# Patient Record
Sex: Female | Born: 1975 | Race: Black or African American | Hispanic: No | Marital: Married | Smoking: Never smoker
Health system: Southern US, Community
[De-identification: ages and names within clinical notes are randomized; demographics above are authoritative.]

## PROBLEM LIST (undated history)

## (undated) DIAGNOSIS — E8881 Metabolic syndrome: Secondary | ICD-10-CM

## (undated) DIAGNOSIS — E88819 Insulin resistance, unspecified: Secondary | ICD-10-CM

## (undated) DIAGNOSIS — F988 Other specified behavioral and emotional disorders with onset usually occurring in childhood and adolescence: Secondary | ICD-10-CM

## (undated) DIAGNOSIS — R928 Other abnormal and inconclusive findings on diagnostic imaging of breast: Secondary | ICD-10-CM

## (undated) DIAGNOSIS — F429 Obsessive-compulsive disorder, unspecified: Secondary | ICD-10-CM

## (undated) DIAGNOSIS — F329 Major depressive disorder, single episode, unspecified: Secondary | ICD-10-CM

## (undated) DIAGNOSIS — F32A Depression, unspecified: Secondary | ICD-10-CM

## (undated) DIAGNOSIS — Z87442 Personal history of urinary calculi: Secondary | ICD-10-CM

## (undated) DIAGNOSIS — E559 Vitamin D deficiency, unspecified: Secondary | ICD-10-CM

## (undated) DIAGNOSIS — K5900 Constipation, unspecified: Secondary | ICD-10-CM

## (undated) DIAGNOSIS — E669 Obesity, unspecified: Secondary | ICD-10-CM

## (undated) DIAGNOSIS — Z8632 Personal history of gestational diabetes: Secondary | ICD-10-CM

## (undated) DIAGNOSIS — R7301 Impaired fasting glucose: Secondary | ICD-10-CM

## (undated) DIAGNOSIS — L309 Dermatitis, unspecified: Secondary | ICD-10-CM

## (undated) DIAGNOSIS — T7840XA Allergy, unspecified, initial encounter: Secondary | ICD-10-CM

## (undated) DIAGNOSIS — F419 Anxiety disorder, unspecified: Secondary | ICD-10-CM

## (undated) DIAGNOSIS — F3181 Bipolar II disorder: Secondary | ICD-10-CM

## (undated) DIAGNOSIS — G629 Polyneuropathy, unspecified: Secondary | ICD-10-CM

## (undated) DIAGNOSIS — N926 Irregular menstruation, unspecified: Secondary | ICD-10-CM

## (undated) DIAGNOSIS — N920 Excessive and frequent menstruation with regular cycle: Secondary | ICD-10-CM

## (undated) DIAGNOSIS — R7303 Prediabetes: Secondary | ICD-10-CM

## (undated) DIAGNOSIS — E039 Hypothyroidism, unspecified: Secondary | ICD-10-CM

## (undated) DIAGNOSIS — Z91018 Allergy to other foods: Secondary | ICD-10-CM

## (undated) DIAGNOSIS — K219 Gastro-esophageal reflux disease without esophagitis: Secondary | ICD-10-CM

## (undated) DIAGNOSIS — M2550 Pain in unspecified joint: Secondary | ICD-10-CM

## (undated) HISTORY — DX: Hypothyroidism, unspecified: E03.9

## (undated) HISTORY — DX: Irregular menstruation, unspecified: N92.6

## (undated) HISTORY — DX: Dermatitis, unspecified: L30.9

## (undated) HISTORY — DX: Allergy, unspecified, initial encounter: T78.40XA

## (undated) HISTORY — DX: Bipolar II disorder: F31.81

## (undated) HISTORY — DX: Other specified behavioral and emotional disorders with onset usually occurring in childhood and adolescence: F98.8

## (undated) HISTORY — DX: Excessive and frequent menstruation with regular cycle: N92.0

## (undated) HISTORY — DX: Polyneuropathy, unspecified: G62.9

## (undated) HISTORY — DX: Obsessive-compulsive disorder, unspecified: F42.9

## (undated) HISTORY — DX: Allergy to other foods: Z91.018

## (undated) HISTORY — DX: Constipation, unspecified: K59.00

## (undated) HISTORY — DX: Personal history of urinary calculi: Z87.442

## (undated) HISTORY — DX: Gastro-esophageal reflux disease without esophagitis: K21.9

## (undated) HISTORY — DX: Pain in unspecified joint: M25.50

## (undated) HISTORY — DX: Vitamin D deficiency, unspecified: E55.9

## (undated) HISTORY — DX: Depression, unspecified: F32.A

## (undated) HISTORY — DX: Anxiety disorder, unspecified: F41.9

## (undated) HISTORY — PX: TRIGGER FINGER RELEASE: SHX641

## (undated) HISTORY — DX: Metabolic syndrome: E88.81

## (undated) HISTORY — DX: Obesity, unspecified: E66.9

## (undated) HISTORY — DX: Other abnormal and inconclusive findings on diagnostic imaging of breast: R92.8

## (undated) HISTORY — DX: Personal history of gestational diabetes: Z86.32

## (undated) HISTORY — DX: Impaired fasting glucose: R73.01

## (undated) HISTORY — DX: Major depressive disorder, single episode, unspecified: F32.9

## (undated) HISTORY — DX: Prediabetes: R73.03

## (undated) HISTORY — DX: Insulin resistance, unspecified: E88.819

---

## 1999-03-21 DIAGNOSIS — O24419 Gestational diabetes mellitus in pregnancy, unspecified control: Secondary | ICD-10-CM

## 2012-03-11 HISTORY — PX: INTRAUTERINE DEVICE INSERTION: SHX323

## 2012-03-20 DIAGNOSIS — Z87442 Personal history of urinary calculi: Secondary | ICD-10-CM

## 2012-03-20 HISTORY — DX: Personal history of urinary calculi: Z87.442

## 2012-03-20 HISTORY — PX: BREAST BIOPSY: SHX20

## 2012-09-11 ENCOUNTER — Ambulatory Visit: Payer: Self-pay | Admitting: Family Medicine

## 2013-01-08 ENCOUNTER — Ambulatory Visit: Payer: Self-pay | Admitting: Family Medicine

## 2013-02-07 ENCOUNTER — Ambulatory Visit: Payer: Self-pay | Admitting: Family Medicine

## 2013-02-12 ENCOUNTER — Ambulatory Visit: Payer: Self-pay | Admitting: Family Medicine

## 2013-02-14 LAB — PATHOLOGY REPORT

## 2014-02-16 ENCOUNTER — Ambulatory Visit: Payer: Self-pay | Admitting: Family Medicine

## 2014-09-10 ENCOUNTER — Other Ambulatory Visit: Payer: Self-pay | Admitting: Unknown Physician Specialty

## 2014-09-10 NOTE — Telephone Encounter (Signed)
Your patient.  Thanks 

## 2014-10-02 ENCOUNTER — Encounter: Payer: Self-pay | Admitting: Family Medicine

## 2014-10-08 DIAGNOSIS — E559 Vitamin D deficiency, unspecified: Secondary | ICD-10-CM | POA: Insufficient documentation

## 2014-10-08 DIAGNOSIS — G629 Polyneuropathy, unspecified: Secondary | ICD-10-CM | POA: Insufficient documentation

## 2014-10-08 DIAGNOSIS — L309 Dermatitis, unspecified: Secondary | ICD-10-CM | POA: Insufficient documentation

## 2014-10-08 DIAGNOSIS — E039 Hypothyroidism, unspecified: Secondary | ICD-10-CM | POA: Insufficient documentation

## 2014-10-08 DIAGNOSIS — R7301 Impaired fasting glucose: Secondary | ICD-10-CM | POA: Insufficient documentation

## 2014-10-08 DIAGNOSIS — E669 Obesity, unspecified: Secondary | ICD-10-CM | POA: Insufficient documentation

## 2014-10-08 DIAGNOSIS — N926 Irregular menstruation, unspecified: Secondary | ICD-10-CM | POA: Insufficient documentation

## 2014-10-09 ENCOUNTER — Encounter: Payer: Self-pay | Admitting: Family Medicine

## 2014-10-09 ENCOUNTER — Ambulatory Visit (INDEPENDENT_AMBULATORY_CARE_PROVIDER_SITE_OTHER): Payer: BC Managed Care – PPO | Admitting: Family Medicine

## 2014-10-09 VITALS — BP 115/73 | HR 65 | Temp 98.5°F | Ht 65.0 in | Wt 234.0 lb

## 2014-10-09 DIAGNOSIS — N200 Calculus of kidney: Secondary | ICD-10-CM

## 2014-10-09 DIAGNOSIS — L298 Other pruritus: Secondary | ICD-10-CM

## 2014-10-09 DIAGNOSIS — A499 Bacterial infection, unspecified: Secondary | ICD-10-CM | POA: Diagnosis not present

## 2014-10-09 DIAGNOSIS — G629 Polyneuropathy, unspecified: Secondary | ICD-10-CM | POA: Insufficient documentation

## 2014-10-09 DIAGNOSIS — E039 Hypothyroidism, unspecified: Secondary | ICD-10-CM | POA: Diagnosis not present

## 2014-10-09 DIAGNOSIS — M545 Low back pain, unspecified: Secondary | ICD-10-CM

## 2014-10-09 DIAGNOSIS — N760 Acute vaginitis: Secondary | ICD-10-CM | POA: Diagnosis not present

## 2014-10-09 DIAGNOSIS — R7301 Impaired fasting glucose: Secondary | ICD-10-CM

## 2014-10-09 DIAGNOSIS — Z124 Encounter for screening for malignant neoplasm of cervix: Secondary | ICD-10-CM | POA: Diagnosis not present

## 2014-10-09 DIAGNOSIS — E559 Vitamin D deficiency, unspecified: Secondary | ICD-10-CM

## 2014-10-09 DIAGNOSIS — Z0000 Encounter for general adult medical examination without abnormal findings: Secondary | ICD-10-CM | POA: Diagnosis not present

## 2014-10-09 DIAGNOSIS — B9689 Other specified bacterial agents as the cause of diseases classified elsewhere: Secondary | ICD-10-CM

## 2014-10-09 DIAGNOSIS — R768 Other specified abnormal immunological findings in serum: Secondary | ICD-10-CM | POA: Diagnosis not present

## 2014-10-09 DIAGNOSIS — N898 Other specified noninflammatory disorders of vagina: Secondary | ICD-10-CM

## 2014-10-09 MED ORDER — METRONIDAZOLE 500 MG PO TABS: 500.0000 mg | ORAL_TABLET | Freq: Two times a day (BID) | ORAL | Status: DC

## 2014-10-09 MED ORDER — CITALOPRAM HYDROBROMIDE 20 MG PO TABS: 20.0000 mg | ORAL_TABLET | Freq: Every day | ORAL | Status: DC

## 2014-10-09 NOTE — Assessment & Plan Note (Signed)
Check A1C today

## 2014-10-09 NOTE — Assessment & Plan Note (Signed)
Check TSH today

## 2014-10-09 NOTE — Progress Notes (Signed)
Patient ID: Kathryn Munoz, female   DOB: 05/19/1975, 39 y.o.   MRN: [id number]   Subjective:   Kathryn Munoz is a 39 y.o. female here for a complete physical exam  Interim issues since last visit: none  Past Medical History  Diagnosis Date  . Hypothyroidism   . IFG (impaired fasting glucose)   . History of gestational diabetes   . Vitamin D deficiency disease   . Obesity   . History of kidney stones 2014  . Irregular periods   . Dermatitis   . Abnormal mammogram     breast biopsy, [facility] 2014  . Neuropathy    Past Surgical History  Procedure Laterality Date  . Breast biopsy  2014    [facility]   Family History  Problem Relation Age of Onset  . Diabetes Mother   . Pancreatitis Mother   . Hypertension Mother   . Diabetes Sister     pre-diabetic  . Diabetes Brother   . ADD / ADHD Son   . Eczema Son    History  Substance Use Topics  . Smoking status: Never Smoker   . Smokeless tobacco: Never Used  . Alcohol Use: No   Older brother had diabetes, lost weight and has been taken off medicine  Review of Systems  Constitutional: Positive for fatigue.  HENT: Positive for hearing loss (has to have people repeat things).   Eyes: Negative for visual disturbance.  Respiratory: Negative for shortness of breath and wheezing.   Cardiovascular: Negative for palpitations and leg swelling.  Gastrointestinal: Positive for constipation (helped by miralax).  Genitourinary: Positive for vaginal discharge.  Musculoskeletal: Positive for back pain (right side, hx of kidney stones).  Skin: Positive for rash (neck, acanthosis nigricans).  Neurological: Positive for numbness (in the right arm for a while, now in all limbs and comes and goes). Negative for weakness.  Psychiatric/Behavioral: The patient is nervous/anxious (still has some anxiety).   thinks medicine is helping  Objective:   Filed Vitals:   10/09/14 1520  BP: 115/73  Pulse: 65  Temp: 98.5 F (36.9 C)  Height:   (1.651 m)  Weight: 234 lb (106.142 kg)  SpO2: 97%   Body mass index is 38.94 kg/(m^2). Wt Readings from Last 3 Encounters:  10/09/14 234 lb (106.142 kg)  05/13/14 240 lb (108.863 kg)   Physical Exam  Constitutional: She appears well-developed and well-nourished.  HENT:  Head: Normocephalic and atraumatic.  Eyes: Conjunctivae and EOM are normal. Right eye exhibits no hordeolum. Left eye exhibits no hordeolum. No scleral icterus.  Neck: Carotid bruit is not present. No thyromegaly present.  Cardiovascular: Normal rate, regular rhythm, S1 normal, S2 normal and normal heart sounds.   No extrasystoles are present.  Pulmonary/Chest: Effort normal and breath sounds normal. No respiratory distress. Right breast exhibits no inverted nipple, no mass, no nipple discharge, no skin change and no tenderness. Left breast exhibits no inverted nipple, no mass, no nipple discharge, no skin change and no tenderness. Breasts are symmetrical.  Abdominal: Soft. Normal appearance and bowel sounds are normal. She exhibits no distension, no abdominal bruit, no pulsatile midline mass and no mass. There is no hepatosplenomegaly. There is no tenderness. No hernia.  Genitourinary: Uterus normal. Pelvic exam was performed with patient prone. There is no rash or lesion on the right labia. There is no rash or lesion on the left labia. Cervix exhibits discharge. Cervix exhibits no motion tenderness. Right adnexum displays no mass, no tenderness and no  fullness. Left adnexum displays no mass, no tenderness and no fullness. Vaginal discharge found.  Musculoskeletal: Normal range of motion. She exhibits no edema.  Lymphadenopathy:       Head (right side): No submandibular adenopathy present.       Head (left side): No submandibular adenopathy present.    She has no cervical adenopathy.    She has no axillary adenopathy.  Neurological: She is alert. She displays no tremor. No cranial nerve deficit. She exhibits normal muscle  tone. Gait normal.  Skin: Skin is warm and dry. No bruising and no ecchymosis noted. No cyanosis. No pallor.  Psychiatric: Her speech is normal and behavior is normal. Thought content normal. Her mood appears not anxious. She does not exhibit a depressed mood.    Assessment/Plan:   Problem List Items Addressed This Visit      Endocrine   Hypothyroidism    Check TSH today      IFG (impaired fasting glucose)    Check A1C today      Relevant Orders   Hgb A1c w/o eAG (Completed)     Nervous and Auditory   Peripheral neuropathy    Referral put in again for neurologist; heavy metals and thyroid and glucose checked; will get TSH and glucose again today      Relevant Medications   citalopram (CELEXA) 20 MG tablet   Other Relevant Orders   Ambulatory referral to Neurology     Other   Vitamin D deficiency disease    Check level today      Relevant Orders   Vit D  25 hydroxy (rtn osteoporosis monitoring) (Completed)    Other Visit Diagnoses    Preventative health care    -  Primary    Relevant Orders    CBC with Differential/Platelet (Completed)    TSH (Completed)    Lipid Panel w/o Chol/HDL Ratio (Completed)    Comprehensive metabolic panel (Completed)    Vaginal itching        Relevant Orders    WET PREP FOR TRICH, YEAST, CLUE (Completed)    Right-sided low back pain without sciatica        Relevant Orders    UA/M w/rflx Culture, Routine (Completed)    Kidney stone        Pap smear for cervical cancer screening        Relevant Orders    Pap liquid-based and HPV (high risk)    Positive ANA (antinuclear antibody)        check again today    Relevant Orders    ANA w/Reflex if Positive (Completed)    Bacterial vaginosis        Relevant Medications    metroNIDAZOLE (FLAGYL) 500 MG tablet        Meds ordered this encounter  Medications  . VITAMIN D, ERGOCALCIFEROL, PO    Sig: Take 5,000 mg by mouth daily.  . citalopram (CELEXA) 20 MG tablet    Sig: Take 1  tablet (20 mg total) by mouth daily.    Dispense:  30 tablet    Refill:  3  . metroNIDAZOLE (FLAGYL) 500 MG tablet    Sig: Take 1 tablet (500 mg total) by mouth 2 (two) times daily.    Dispense:  14 tablet    Refill:  0   Orders Placed This Encounter  Procedures  . WET PREP FOR TRICH, YEAST, CLUE  . Microscopic Examination  . UA/M w/rflx Culture, Routine  . CBC with Differential/Platelet  .  Hgb A1c w/o eAG  . ANA w/Reflex if Positive  . TSH  . Lipid Panel w/o Chol/HDL Ratio    Order Specific Question:  Has the patient fasted?    Answer:  Yes  . Comprehensive metabolic panel    Order Specific Question:  Has the patient fasted?    Answer:  Yes  . Vit D  25 hydroxy (rtn osteoporosis monitoring)  . Ambulatory referral to Neurology    Referral Priority:  Routine    Referral Type:  Consultation    Referral Reason:  Specialty Services Required    Requested Specialty:  Neurology    Number of Visits Requested:  1    Follow up plan: Return in about 1 year (around 10/09/2015) for physical.  An After Visit Summary was printed and given to the patient.

## 2014-10-09 NOTE — Assessment & Plan Note (Signed)
Referral put in again for neurologist; heavy metals and thyroid and glucose checked; will get TSH and glucose again today

## 2014-10-09 NOTE — Assessment & Plan Note (Signed)
Check level today 

## 2014-10-09 NOTE — Patient Instructions (Addendum)
If you have not heard anything from my staff in a week about any orders/referrals/studies from today, please contact us here to follow-up [phone] Increase the citalopram to 20 mg daily Please call me in 3-4 weeks with an update, but call sooner with any problems Next physical in one year  Health Maintenance Adopting a healthy lifestyle and getting preventive care can go a long way to promote health and wellness. Talk with your health care provider about what schedule of regular examinations is right for you. This is a good chance for you to check in with your provider about disease prevention and staying healthy. In between checkups, there are plenty of things you can do on your own. Experts have done a lot of research about which lifestyle changes and preventive measures are most likely to keep you healthy. Ask your health care provider for more information. WEIGHT AND DIET  Eat a healthy diet  Be sure to include plenty of vegetables, fruits, low-fat dairy products, and lean protein.  Do not eat a lot of foods high in solid fats, added sugars, or salt.  Get regular exercise. This is one of the most important things you can do for your health.  Most adults should exercise for at least 150 minutes each week. The exercise should increase your heart rate and make you sweat (moderate-intensity exercise).  Most adults should also do strengthening exercises at least twice a week. This is in addition to the moderate-intensity exercise.  Maintain a healthy weight  Body mass index (BMI) is a measurement that can be used to identify possible weight problems. It estimates body fat based on height and weight. Your health care provider can help determine your BMI and help you achieve or maintain a healthy weight.  For females 70 years of age and older:   A BMI below 18.5 is considered underweight.  A BMI of 18.5 to 24.9 is normal.  A BMI of 25 to 29.9 is considered overweight.  A BMI of  30 and above is considered obese.  Watch levels of cholesterol and blood lipids  You should start having your blood tested for lipids and cholesterol at 37 years of age, then have this test every 5 years.  You may need to have your cholesterol levels checked more often if:  Your lipid or cholesterol levels are high.  You are older than 37 years of age.  You are at high risk for heart disease.  CANCER SCREENING   Lung Cancer  Lung cancer screening is recommended for adults 55-85 years old who are at high risk for lung cancer because of a history of smoking.  A yearly low-dose CT scan of the lungs is recommended for people who:  Currently smoke.  Have quit within the past 15 years.  Have at least a 30-pack-year history of smoking. A pack year is smoking an average of one pack of cigarettes a day for 1 year.  Yearly screening should continue until it has been 15 years since you quit.  Yearly screening should stop if you develop a health problem that would prevent you from having lung cancer treatment.  Breast Cancer  Practice breast self-awareness. This means understanding how your breasts normally appear and feel.  It also means doing regular breast self-exams. Let your health care provider know about any changes, no matter how small.  If you are in your 20s or 30s, you should have a clinical breast exam (CBE) by a health care provider every  provider every 1-3 years as part of a regular health exam.  If you are 40 or older, have a CBE every year. Also consider having a breast X-ray (mammogram) every year.  If you have a family history of breast cancer, talk to your health care provider about genetic screening.  If you are at high risk for breast cancer, talk to your health care provider about having an MRI and a mammogram every year.  Breast cancer gene (BRCA) assessment is recommended for women who have family members with BRCA-related cancers. BRCA-related cancers  include:  Breast.  Ovarian.  Tubal.  Peritoneal cancers.  Results of the assessment will determine the need for genetic counseling and BRCA1 and BRCA2 testing. Cervical Cancer Routine pelvic examinations to screen for cervical cancer are no longer recommended for nonpregnant women who are considered low risk for cancer of the pelvic organs (ovaries, uterus, and vagina) and who do not have symptoms. A pelvic examination may be necessary if you have symptoms including those associated with pelvic infections. Ask your health care provider if a screening pelvic exam is right for you.   The Pap test is the screening test for cervical cancer for women who are considered at risk.  If you had a hysterectomy for a problem that was not cancer or a condition that could lead to cancer, then you no longer need Pap tests.  If you are older than 65 years, and you have had normal Pap tests for the past 10 years, you no longer need to have Pap tests.  If you have had past treatment for cervical cancer or a condition that could lead to cancer, you need Pap tests and screening for cancer for at least 20 years after your treatment.  If you no longer get a Pap test, assess your risk factors if they change (such as having a new sexual partner). This can affect whether you should start being screened again.  Some women have medical problems that increase their chance of getting cervical cancer. If this is the case for you, your health care provider may recommend more frequent screening and Pap tests.  The human papillomavirus (HPV) test is another test that may be used for cervical cancer screening. The HPV test looks for the virus that can cause cell changes in the cervix. The cells collected during the Pap test can be tested for HPV.  The HPV test can be used to screen women 30 years of age and older. Getting tested for HPV can extend the interval between normal Pap tests from three to five years.  An HPV  test also should be used to screen women of any age who have unclear Pap test results.  After 37 years of age, women should have HPV testing as often as Pap tests.  Colorectal Cancer  This type of cancer can be detected and often prevented.  Routine colorectal cancer screening usually begins at 37 years of age and continues through 37 years of age.  Your health care provider may recommend screening at an earlier age if you have risk factors for colon cancer.  Your health care provider may also recommend using home test kits to check for hidden blood in the stool.  A small camera at the end of a tube can be used to examine your colon directly (sigmoidoscopy or colonoscopy). This is done to check for the earliest forms of colorectal cancer.  Routine screening usually begins at age 50.  Direct examination of the colon   should be repeated every 5-10 years through 37 years of age. However, you may need to be screened more often if early forms of precancerous polyps or small growths are found. Skin Cancer  Check your skin from head to toe regularly.  Tell your health care provider about any new moles or changes in moles, especially if there is a change in a mole's shape or color.  Also tell your health care provider if you have a mole that is larger than the size of a pencil eraser.  Always use sunscreen. Apply sunscreen liberally and repeatedly throughout the day.  Protect yourself by wearing long sleeves, pants, a wide-brimmed hat, and sunglasses whenever you are outside. HEART DISEASE, DIABETES, AND HIGH BLOOD PRESSURE   Have your blood pressure checked at least every 1-2 years. High blood pressure causes heart disease and increases the risk of stroke.  If you are between 55 years and 79 years old, ask your health care provider if you should take aspirin to prevent strokes.  Have regular diabetes screenings. This involves taking a blood sample to check your fasting blood sugar  level.  If you are at a normal weight and have a low risk for diabetes, have this test once every three years after 37 years of age.  If you are overweight and have a high risk for diabetes, consider being tested at a younger age or more often. PREVENTING INFECTION  Hepatitis B  If you have a higher risk for hepatitis B, you should be screened for this virus. You are considered at high risk for hepatitis B if:  You were born in a country where hepatitis B is common. Ask your health care provider which countries are considered high risk.  Your parents were born in a high-risk country, and you have not been immunized against hepatitis B (hepatitis B vaccine).  You have HIV or AIDS.  You use needles to inject street drugs.  You live with someone who has hepatitis B.  You have had sex with someone who has hepatitis B.  You get hemodialysis treatment.  You take certain medicines for conditions, including cancer, organ transplantation, and autoimmune conditions. Hepatitis C  Blood testing is recommended for:  Everyone born from 1945 through 1965.  Anyone with known risk factors for hepatitis C. Sexually transmitted infections (STIs)  You should be screened for sexually transmitted infections (STIs) including gonorrhea and chlamydia if:  You are sexually active and are younger than 37 years of age.  You are older than 37 years of age and your health care provider tells you that you are at risk for this type of infection.  Your sexual activity has changed since you were last screened and you are at an increased risk for chlamydia or gonorrhea. Ask your health care provider if you are at risk.  If you do not have HIV, but are at risk, it may be recommended that you take a prescription medicine daily to prevent HIV infection. This is called pre-exposure prophylaxis (PrEP). You are considered at risk if:  You are sexually active and do not regularly use condoms or know the HIV status  of your partner(s).  You take drugs by injection.  You are sexually active with a partner who has HIV. Talk with your health care provider about whether you are at high risk of being infected with HIV. If you choose to begin PrEP, you should first be tested for HIV. You should then be tested every 3 months for   as long as you are taking PrEP.  PREGNANCY   If you are premenopausal and you may become pregnant, ask your health care provider about preconception counseling.  If you may become pregnant, take 400 to 800 micrograms (mcg) of folic acid every day.  If you want to prevent pregnancy, talk to your health care provider about birth control (contraception). OSTEOPOROSIS AND MENOPAUSE   Osteoporosis is a disease in which the bones lose minerals and strength with aging. This can result in serious bone fractures. Your risk for osteoporosis can be identified using a bone density scan.  If you are 65 years of age or older, or if you are at risk for osteoporosis and fractures, ask your health care provider if you should be screened.  Ask your health care provider whether you should take a calcium or vitamin D supplement to lower your risk for osteoporosis.  Menopause may have certain physical symptoms and risks.  Hormone replacement therapy may reduce some of these symptoms and risks. Talk to your health care provider about whether hormone replacement therapy is right for you.  HOME CARE INSTRUCTIONS   Schedule regular health, dental, and eye exams.  Stay current with your immunizations.   Do not use any tobacco products including cigarettes, chewing tobacco, or electronic cigarettes.  If you are pregnant, do not drink alcohol.  If you are breastfeeding, limit how much and how often you drink alcohol.  Limit alcohol intake to no more than 1 drink per day for nonpregnant women. One drink equals 12 ounces of beer, 5 ounces of wine, or 1 ounces of hard liquor.  Do not use street  drugs.  Do not share needles.  Ask your health care provider for help if you need support or information about quitting drugs.  Tell your health care provider if you often feel depressed.  Tell your health care provider if you have ever been abused or do not feel safe at home. Document Released: 09/19/2010 Document Revised: 07/21/2013 Document Reviewed: 02/05/2013 [employer] Patient Information 2015 [employer]. This information is not intended to replace advice given to you by your health care provider. Make sure you discuss any questions you have with your health care provider.  

## 2014-10-10 ENCOUNTER — Encounter: Payer: Self-pay | Admitting: Family Medicine

## 2014-10-10 LAB — LIPID PANEL W/O CHOL/HDL RATIO
Cholesterol, Total: 181 mg/dL (ref 100–199)
HDL: 40 mg/dL (ref >39)
LDL Calculated: 113 mg/dL — ABNORMAL HIGH (ref 0–99)
Triglycerides: 140 mg/dL (ref 0–149)
VLDL Cholesterol Cal: 28 mg/dL (ref 5–40)

## 2014-10-10 LAB — CBC WITH DIFFERENTIAL/PLATELET
BASOS: 1 %
Basophils Absolute: 0 10*3/uL (ref 0.0–0.2)
EOS (ABSOLUTE): 0.1 10*3/uL (ref 0.0–0.4)
EOS: 2 %
Hematocrit: 37.4 % (ref 34.0–46.6)
Hemoglobin: 12.4 g/dL (ref 11.1–15.9)
IMMATURE GRANULOCYTES: 0 %
Immature Grans (Abs): 0 10*3/uL (ref 0.0–0.1)
LYMPHS ABS: 3 10*3/uL (ref 0.7–3.1)
Lymphs: 34 %
MCH: 30.5 pg (ref 26.6–33.0)
MCHC: 33.2 g/dL (ref 31.5–35.7)
MCV: 92 fL (ref 79–97)
MONOCYTES: 4 %
Monocytes Absolute: 0.4 10*3/uL (ref 0.1–0.9)
NEUTROS PCT: 59 %
Neutrophils Absolute: 5.2 10*3/uL (ref 1.4–7.0)
PLATELETS: 353 10*3/uL (ref 150–379)
RBC: 4.07 x10E6/uL (ref 3.77–5.28)
RDW: 12.1 % — ABNORMAL LOW (ref 12.3–15.4)
WBC: 8.7 10*3/uL (ref 3.4–10.8)

## 2014-10-10 LAB — COMPREHENSIVE METABOLIC PANEL
A/G RATIO: 1.4 (ref 1.1–2.5)
ALT: 13 IU/L (ref 0–32)
AST: 17 IU/L (ref 0–40)
Albumin: 4.3 g/dL (ref 3.5–5.5)
Alkaline Phosphatase: 62 IU/L (ref 39–117)
BUN/Creatinine Ratio: 13 (ref 8–20)
BUN: 13 mg/dL (ref 6–20)
Bilirubin Total: 1 mg/dL (ref 0.0–1.2)
CALCIUM: 9.6 mg/dL (ref 8.7–10.2)
CO2: 25 mmol/L (ref 18–29)
Chloride: 103 mmol/L (ref 97–108)
Creatinine, Ser: 0.97 mg/dL (ref 0.57–1.00)
GFR calc non Af Amer: 75 mL/min/{1.73_m2} (ref >59)
GFR, EST AFRICAN AMERICAN: 86 mL/min/{1.73_m2} (ref >59)
Globulin, Total: 3 g/dL (ref 1.5–4.5)
Glucose: 87 mg/dL (ref 65–99)
POTASSIUM: 4.3 mmol/L (ref 3.5–5.2)
SODIUM: 143 mmol/L (ref 134–144)
Total Protein: 7.3 g/dL (ref 6.0–8.5)

## 2014-10-10 LAB — WET PREP FOR TRICH, YEAST, CLUE
CLUE CELL EXAM: POSITIVE — AB
Trichomonas Exam: NEGATIVE
Yeast Exam: NEGATIVE

## 2014-10-10 LAB — TSH: TSH: 3.36 u[IU]/mL (ref 0.450–4.500)

## 2014-10-10 LAB — MICROSCOPIC EXAMINATION

## 2014-10-10 LAB — ANA W/REFLEX IF POSITIVE: Anti Nuclear Antibody(ANA): NEGATIVE

## 2014-10-10 LAB — VITAMIN D 25 HYDROXY (VIT D DEFICIENCY, FRACTURES): VIT D 25 HYDROXY: 39.6 ng/mL (ref 30.0–100.0)

## 2014-10-10 LAB — HGB A1C W/O EAG: Hgb A1c MFr Bld: 5 % (ref 4.8–5.6)

## 2014-10-11 LAB — UA/M W/RFLX CULTURE, ROUTINE: ORGANISM ID, BACTERIA: NO GROWTH

## 2014-10-15 LAB — PAP LB AND HPV HIGH-RISK
HPV, high-risk: NEGATIVE
PAP Smear Comment: 0

## 2014-10-21 ENCOUNTER — Other Ambulatory Visit: Payer: Self-pay | Admitting: Family Medicine

## 2014-10-21 NOTE — Telephone Encounter (Signed)
Routing to provider  

## 2014-10-22 NOTE — Telephone Encounter (Signed)
Needs TSH checked before we can refill this. Please have her come in for blood work ASAP.

## 2014-10-22 NOTE — Telephone Encounter (Signed)
Never mind! She came in. Rx sent to her pharmacy.

## 2014-11-18 ENCOUNTER — Other Ambulatory Visit: Payer: Self-pay | Admitting: Neurology

## 2014-11-18 DIAGNOSIS — G6289 Other specified polyneuropathies: Secondary | ICD-10-CM

## 2014-11-26 ENCOUNTER — Ambulatory Visit: Admission: RE | Admit: 2014-11-26 | Payer: BC Managed Care – PPO | Source: Ambulatory Visit

## 2014-11-26 ENCOUNTER — Ambulatory Visit
Admission: RE | Admit: 2014-11-26 | Discharge: 2014-11-26 | Disposition: A | Discharge status: Home / Self Care | Payer: BC Managed Care – PPO | Source: Ambulatory Visit | Attending: Neurology | Admitting: Neurology

## 2014-11-26 DIAGNOSIS — G6289 Other specified polyneuropathies: Secondary | ICD-10-CM | POA: Diagnosis present

## 2014-11-26 DIAGNOSIS — M2578 Osteophyte, vertebrae: Secondary | ICD-10-CM | POA: Insufficient documentation

## 2014-11-26 DIAGNOSIS — M4802 Spinal stenosis, cervical region: Secondary | ICD-10-CM | POA: Diagnosis not present

## 2014-11-26 DIAGNOSIS — M4804 Spinal stenosis, thoracic region: Secondary | ICD-10-CM | POA: Insufficient documentation

## 2014-11-26 DIAGNOSIS — R200 Anesthesia of skin: Secondary | ICD-10-CM | POA: Diagnosis present

## 2014-11-26 DIAGNOSIS — M5022 Other cervical disc displacement, mid-cervical region: Secondary | ICD-10-CM | POA: Diagnosis not present

## 2014-11-26 DIAGNOSIS — R202 Paresthesia of skin: Secondary | ICD-10-CM | POA: Insufficient documentation

## 2014-11-26 MED ORDER — GADOBENATE DIMEGLUMINE 529 MG/ML IV SOLN
20.0000 mL | Freq: Once | INTRAVENOUS | Status: AC | PRN
  Administered 2014-11-26: 20 mL via INTRAVENOUS

## 2014-12-02 ENCOUNTER — Other Ambulatory Visit: Payer: Self-pay | Admitting: Family Medicine

## 2014-12-02 NOTE — Telephone Encounter (Signed)
Your Patient 

## 2014-12-02 NOTE — Telephone Encounter (Signed)
Last TSH normal; Rx approved 

## 2014-12-02 NOTE — Telephone Encounter (Signed)
Your patient 

## 2015-02-02 ENCOUNTER — Other Ambulatory Visit: Payer: Self-pay | Admitting: Family Medicine

## 2015-02-02 NOTE — Telephone Encounter (Signed)
No meds, no documentation; note was closed when it came to me

## 2015-02-02 NOTE — Telephone Encounter (Signed)
Your patient.  Thanks 

## 2015-03-12 DIAGNOSIS — Z6841 Body Mass Index (BMI) 40.0 and over, adult: Secondary | ICD-10-CM

## 2015-06-26 IMAGING — MG MM DIGITAL SCREENING BILAT W/ CAD
3 series · 6 of 6 positions shown · non-contrast
Comparison: None

CLINICAL DATA: Screening.

EXAM:
DIGITAL SCREENING BILATERAL MAMMOGRAM WITH CAD

[R CC · right · 4 of 4 slices shown]
[im 1/4]
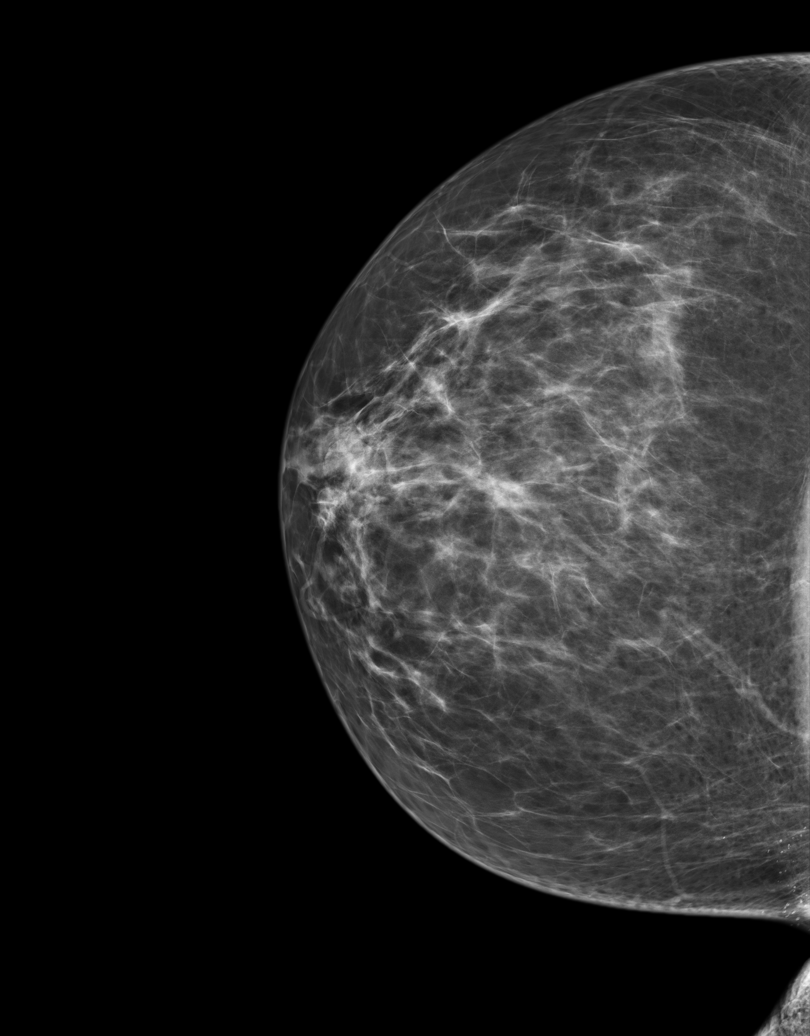
[im 2/4]
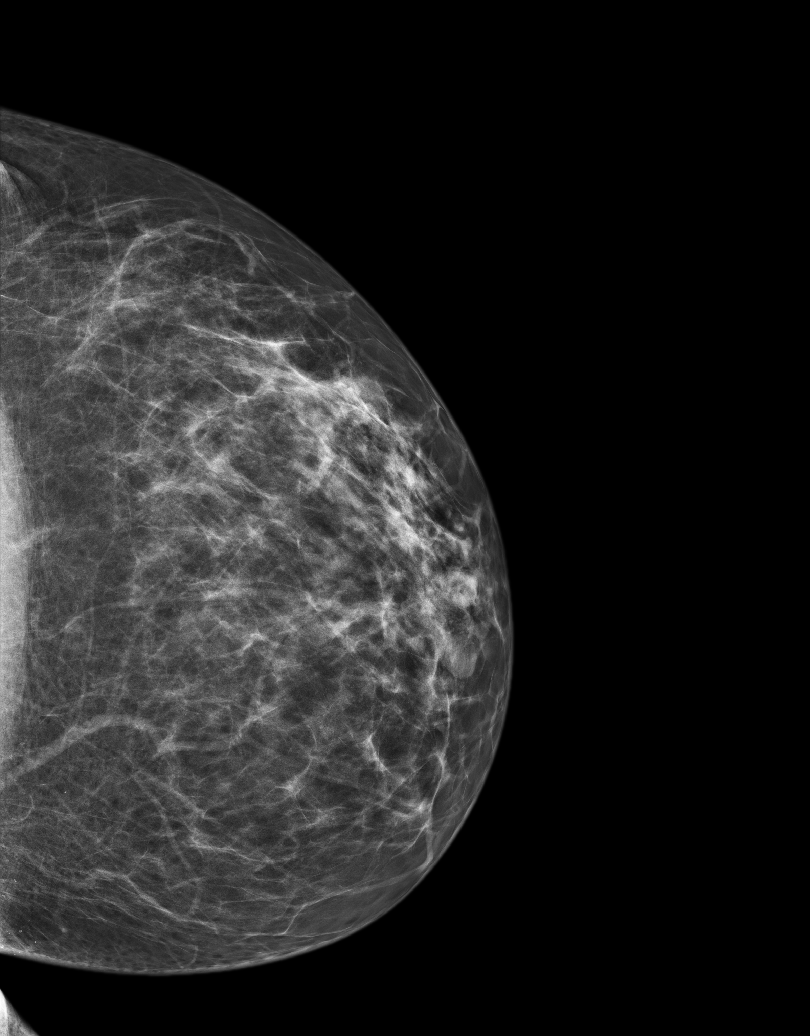
[im 3/4]
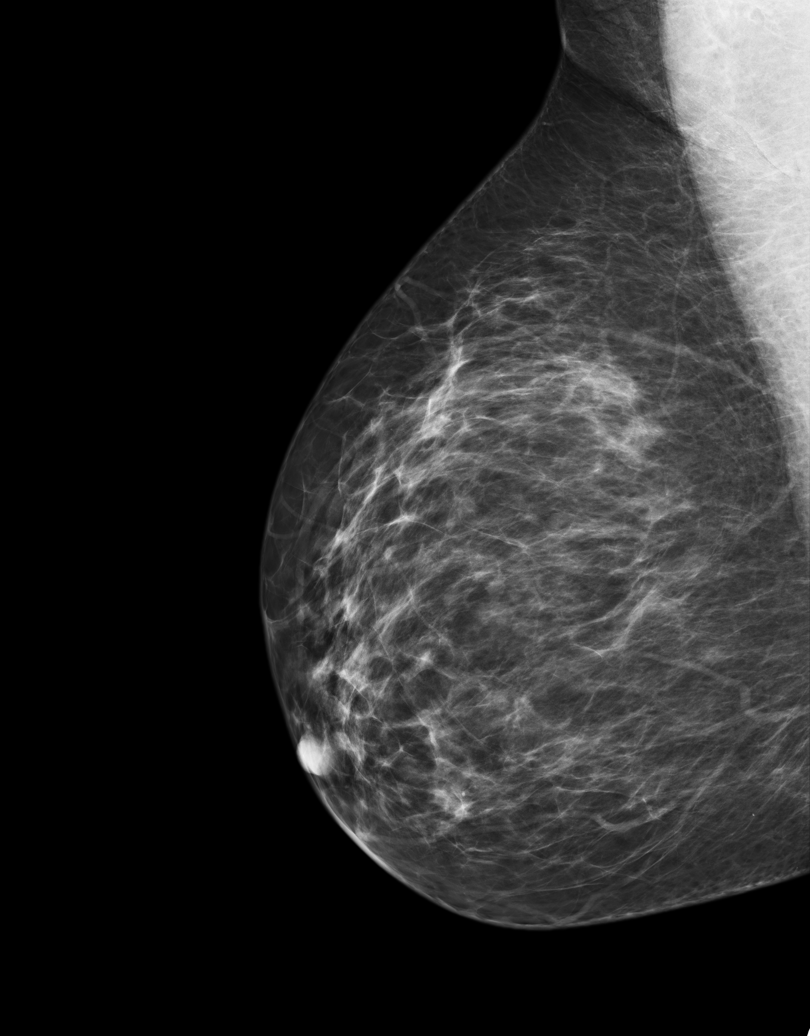
[im 4/4]
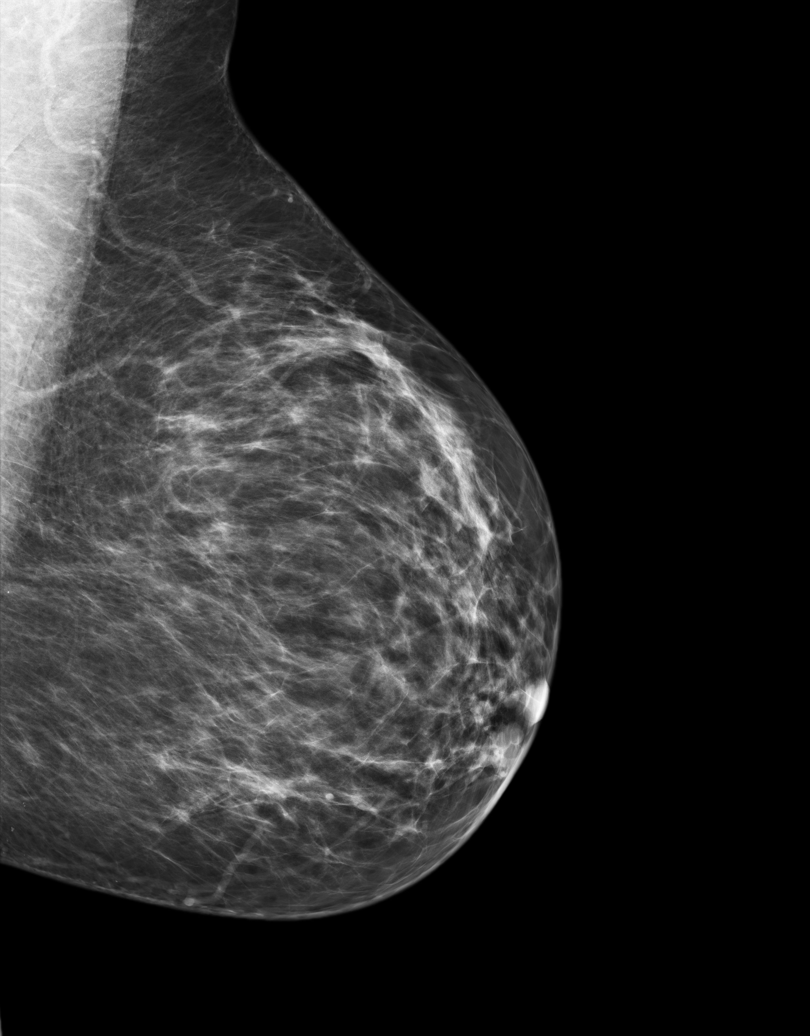

[L CC]
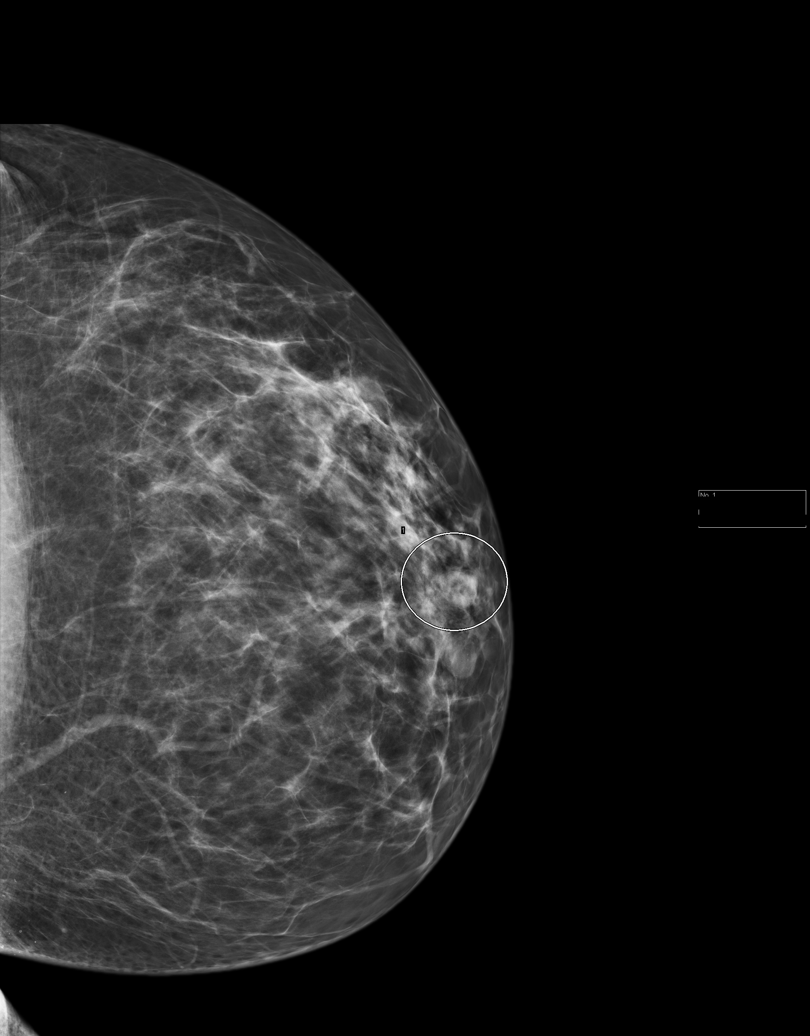

[L MLO]
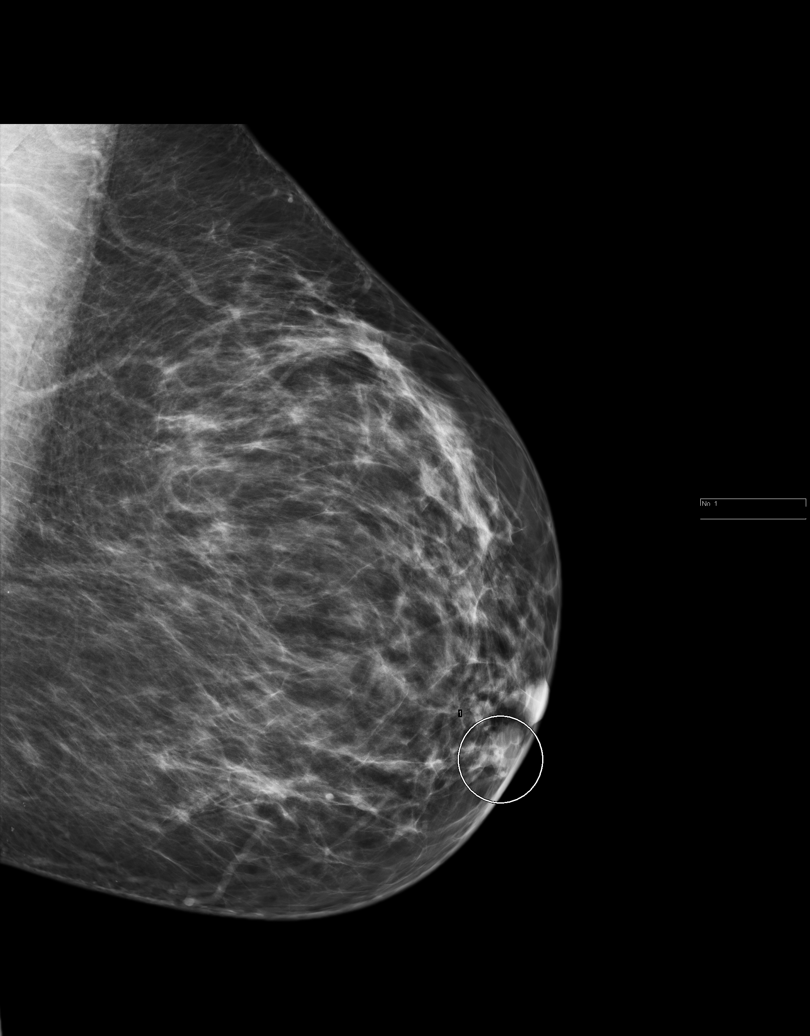

[6 of 6 positions shown; findings below may reference images not displayed]

ACR Breast Density Category c: The breasts are heterogeneously
dense, which may obscure small masses.
FINDINGS: In the left breast, a possible mass warrants further evaluation with
spot compression views and possibly ultrasound. In the right breast,
no masses or malignant type calcifications are identified. Images
were processed with CAD.
IMPRESSION: Further evaluation is suggested for possible mass in the left
breast.

RECOMMENDATION:
Diagnostic mammogram and possibly ultrasound of the left breast.
(Code:WS-K-CCV)

The patient will be contacted regarding the findings, and additional
imaging will be scheduled.

BI-RADS CATEGORY  0: Incomplete. Need additional imaging evaluation
and/or prior mammograms for comparison.

## 2015-08-27 ENCOUNTER — Other Ambulatory Visit: Payer: Self-pay

## 2015-08-27 NOTE — Telephone Encounter (Signed)
Last TSH reviewed; 11 months of medicine sent in last September; she should not be out; refill request denied

## 2015-08-30 ENCOUNTER — Other Ambulatory Visit: Payer: Self-pay

## 2015-08-30 NOTE — Telephone Encounter (Signed)
She should not be out of medicine yet See 08/27/15 refill request note Please request fill hx from pharmacist; thank you

## 2015-09-10 ENCOUNTER — Telehealth: Payer: Self-pay | Admitting: Family Medicine

## 2015-09-10 MED ORDER — LEVOTHYROXINE SODIUM 50 MCG PO TABS: 50.0000 ug | ORAL_TABLET | Freq: Every day | ORAL | Status: DC

## 2015-09-10 NOTE — Telephone Encounter (Signed)
rx sent

## 2015-09-10 NOTE — Telephone Encounter (Signed)
Pt needs refill on Levothyroxine to be sent to [facility] [place]. Pt has an appt for her CPE 10/11/2015.

## 2015-10-11 ENCOUNTER — Encounter: Payer: Self-pay | Admitting: Family Medicine

## 2015-10-11 ENCOUNTER — Ambulatory Visit (INDEPENDENT_AMBULATORY_CARE_PROVIDER_SITE_OTHER): Payer: BC Managed Care – PPO | Admitting: Family Medicine

## 2015-10-11 VITALS — BP 116/72 | HR 70 | Temp 98.4°F | Resp 16 | Wt 226.0 lb

## 2015-10-11 DIAGNOSIS — R7301 Impaired fasting glucose: Secondary | ICD-10-CM

## 2015-10-11 DIAGNOSIS — N926 Irregular menstruation, unspecified: Secondary | ICD-10-CM | POA: Diagnosis not present

## 2015-10-11 DIAGNOSIS — Z0001 Encounter for general adult medical examination with abnormal findings: Secondary | ICD-10-CM | POA: Diagnosis not present

## 2015-10-11 DIAGNOSIS — E559 Vitamin D deficiency, unspecified: Secondary | ICD-10-CM

## 2015-10-11 DIAGNOSIS — Z0000 Encounter for general adult medical examination without abnormal findings: Secondary | ICD-10-CM | POA: Insufficient documentation

## 2015-10-11 DIAGNOSIS — Z30431 Encounter for routine checking of intrauterine contraceptive device: Secondary | ICD-10-CM | POA: Diagnosis not present

## 2015-10-11 DIAGNOSIS — R221 Localized swelling, mass and lump, neck: Secondary | ICD-10-CM

## 2015-10-11 DIAGNOSIS — Z23 Encounter for immunization: Secondary | ICD-10-CM | POA: Diagnosis not present

## 2015-10-11 DIAGNOSIS — E669 Obesity, unspecified: Secondary | ICD-10-CM

## 2015-10-11 DIAGNOSIS — E039 Hypothyroidism, unspecified: Secondary | ICD-10-CM

## 2015-10-11 LAB — CBC WITH DIFFERENTIAL/PLATELET
BASOS ABS: 0 {cells}/uL (ref 0–200)
BASOS PCT: 0 %
EOS ABS: 140 {cells}/uL (ref 15–500)
Eosinophils Relative: 2 %
HCT: 38.9 % (ref 35.0–45.0)
Hemoglobin: 12.5 g/dL (ref 11.7–15.5)
LYMPHS PCT: 41 %
Lymphs Abs: 2870 cells/uL (ref 850–3900)
MCH: 30.5 pg (ref 27.0–33.0)
MCHC: 32.1 g/dL (ref 32.0–36.0)
MCV: 94.9 fL (ref 80.0–100.0)
MONO ABS: 420 {cells}/uL (ref 200–950)
MONOS PCT: 6 %
MPV: 9.1 fL (ref 7.5–12.5)
NEUTROS ABS: 3570 {cells}/uL (ref 1500–7800)
Neutrophils Relative %: 51 %
PLATELETS: 296 10*3/uL (ref 140–400)
RBC: 4.1 MIL/uL (ref 3.80–5.10)
RDW: 12.3 % (ref 11.0–15.0)
WBC: 7 10*3/uL (ref 3.8–10.8)

## 2015-10-11 LAB — COMPREHENSIVE METABOLIC PANEL
ALT: 12 U/L (ref 6–29)
AST: 17 U/L (ref 10–30)
Albumin: 4.2 g/dL (ref 3.6–5.1)
Alkaline Phosphatase: 59 U/L (ref 33–115)
BUN: 8 mg/dL (ref 7–25)
CALCIUM: 9.5 mg/dL (ref 8.6–10.2)
CO2: 26 mmol/L (ref 20–31)
Chloride: 103 mmol/L (ref 98–110)
Creat: 0.77 mg/dL (ref 0.50–1.10)
GLUCOSE: 85 mg/dL (ref 65–99)
POTASSIUM: 4.2 mmol/L (ref 3.5–5.3)
Sodium: 137 mmol/L (ref 135–146)
Total Bilirubin: 1.5 mg/dL — ABNORMAL HIGH (ref 0.2–1.2)
Total Protein: 7.1 g/dL (ref 6.1–8.1)

## 2015-10-11 LAB — LIPID PANEL
CHOL/HDL RATIO: 3.6 ratio (ref <=5.0)
CHOLESTEROL: 183 mg/dL (ref 125–200)
HDL: 51 mg/dL (ref >=46)
LDL Cholesterol: 109 mg/dL (ref <130)
TRIGLYCERIDES: 113 mg/dL (ref <150)
VLDL: 23 mg/dL (ref <30)

## 2015-10-11 LAB — TSH: TSH: 2.97 m[IU]/L

## 2015-10-11 NOTE — Assessment & Plan Note (Signed)
Order thyroid test and [place]

## 2015-10-11 NOTE — Assessment & Plan Note (Signed)
Check A1c. 

## 2015-10-11 NOTE — Assessment & Plan Note (Signed)
Last was normal; start 1,000 iu vitamin D3 daily

## 2015-10-11 NOTE — Assessment & Plan Note (Signed)
Check labs today.

## 2015-10-11 NOTE — Patient Instructions (Addendum)
Start taking 1,000 iu of vitamin D3 once a day We'll get labs today We'll get an ultrasound of your thyroid and your uterus If you have not heard anything from my staff in a week about any orders/referrals/studies from today, please contact us here to follow-up [phone] I'll suggest abstinence or back-up birth control (condoms, for example) until we verify if IUD is in place and still active   Health Maintenance, Female Adopting a healthy lifestyle and getting preventive care can go a long way to promote health and wellness. Talk with your health care provider about what schedule of regular examinations is right for you. This is a good chance for you to check in with your provider about disease prevention and staying healthy. In between checkups, there are plenty of things you can do on your own. Experts have done a lot of research about which lifestyle changes and preventive measures are most likely to keep you healthy. Ask your health care provider for more information. WEIGHT AND DIET  Eat a healthy diet  Be sure to include plenty of vegetables, fruits, low-fat dairy products, and lean protein.  Do not eat a lot of foods high in solid fats, added sugars, or salt.  Get regular exercise. This is one of the most important things you can do for your health.  Most adults should exercise for at least 150 minutes each week. The exercise should increase your heart rate and make you sweat (moderate-intensity exercise).  Most adults should also do strengthening exercises at least twice a week. This is in addition to the moderate-intensity exercise.  Maintain a healthy weight  Body mass index (BMI) is a measurement that can be used to identify possible weight problems. It estimates body fat based on height and weight. Your health care provider can help determine your BMI and help you achieve or maintain a healthy weight.  For females 66 years of age and older:   A BMI below 18.5 is  considered underweight.  A BMI of 18.5 to 24.9 is normal.  A BMI of 25 to 29.9 is considered overweight.  A BMI of 30 and above is considered obese.  Watch levels of cholesterol and blood lipids  You should start having your blood tested for lipids and cholesterol at 38 years of age, then have this test every 5 years.  You may need to have your cholesterol levels checked more often if:  Your lipid or cholesterol levels are high.  You are older than 38 years of age.  You are at high risk for heart disease.  CANCER SCREENING   Lung Cancer  Lung cancer screening is recommended for adults 89-54 years old who are at high risk for lung cancer because of a history of smoking.  A yearly low-dose CT scan of the lungs is recommended for people who:  Currently smoke.  Have quit within the past 15 years.  Have at least a 30-pack-year history of smoking. A pack year is smoking an average of one pack of cigarettes a day for 1 year.  Yearly screening should continue until it has been 15 years since you quit.  Yearly screening should stop if you develop a health problem that would prevent you from having lung cancer treatment.  Breast Cancer  Practice breast self-awareness. This means understanding how your breasts normally appear and feel.  It also means doing regular breast self-exams. Let your health care provider know about any changes, no matter how small.  If you  are in your 20s or 30s, you should have a clinical breast exam (CBE) by a health care provider every 1-3 years as part of a regular health exam.  If you are 42 or older, have a CBE every year. Also consider having a breast X-ray (mammogram) every year.  If you have a family history of breast cancer, talk to your health care provider about genetic screening.  If you are at high risk for breast cancer, talk to your health care provider about having an MRI and a mammogram every year.  Breast cancer gene (BRCA)  assessment is recommended for women who have family members with BRCA-related cancers. BRCA-related cancers include:  Breast.  Ovarian.  Tubal.  Peritoneal cancers.  Results of the assessment will determine the need for genetic counseling and BRCA1 and BRCA2 testing. Cervical Cancer Your health care provider may recommend that you be screened regularly for cancer of the pelvic organs (ovaries, uterus, and vagina). This screening involves a pelvic examination, including checking for microscopic changes to the surface of your cervix (Pap test). You may be encouraged to have this screening done every 3 years, beginning at age 53.  For women ages 27-65, health care providers may recommend pelvic exams and Pap testing every 3 years, or they may recommend the Pap and pelvic exam, combined with testing for human papilloma virus (HPV), every 5 years. Some types of HPV increase your risk of cervical cancer. Testing for HPV may also be done on women of any age with unclear Pap test results.  Other health care providers may not recommend any screening for nonpregnant women who are considered low risk for pelvic cancer and who do not have symptoms. Ask your health care provider if a screening pelvic exam is right for you.  If you have had past treatment for cervical cancer or a condition that could lead to cancer, you need Pap tests and screening for cancer for at least 20 years after your treatment. If Pap tests have been discontinued, your risk factors (such as having a new sexual partner) need to be reassessed to determine if screening should resume. Some women have medical problems that increase the chance of getting cervical cancer. In these cases, your health care provider may recommend more frequent screening and Pap tests. Colorectal Cancer  This type of cancer can be detected and often prevented.  Routine colorectal cancer screening usually begins at 38 years of age and continues through 38 years  of age.  Your health care provider may recommend screening at an earlier age if you have risk factors for colon cancer.  Your health care provider may also recommend using home test kits to check for hidden blood in the stool.  A small camera at the end of a tube can be used to examine your colon directly (sigmoidoscopy or colonoscopy). This is done to check for the earliest forms of colorectal cancer.  Routine screening usually begins at age 3.  Direct examination of the colon should be repeated every 5-10 years through 38 years of age. However, you may need to be screened more often if early forms of precancerous polyps or small growths are found. Skin Cancer  Check your skin from head to toe regularly.  Tell your health care provider about any new moles or changes in moles, especially if there is a change in a mole's shape or color.  Also tell your health care provider if you have a mole that is larger than the size of  a pencil eraser.  Always use sunscreen. Apply sunscreen liberally and repeatedly throughout the day.  Protect yourself by wearing long sleeves, pants, a wide-brimmed hat, and sunglasses whenever you are outside. HEART DISEASE, DIABETES, AND HIGH BLOOD PRESSURE   High blood pressure causes heart disease and increases the risk of stroke. High blood pressure is more likely to develop in:  People who have blood pressure in the high end of the normal range (130-139/85-89 mm Hg).  People who are overweight or obese.  People who are African American.  If you are 33-50 years of age, have your blood pressure checked every 3-5 years. If you are 18 years of age or older, have your blood pressure checked every year. You should have your blood pressure measured twice--once when you are at a hospital or clinic, and once when you are not at a hospital or clinic. Record the average of the two measurements. To check your blood pressure when you are not at a hospital or clinic, you  can use:  An automated blood pressure machine at a pharmacy.  A home blood pressure monitor.  If you are between 30 years and 45 years old, ask your health care provider if you should take aspirin to prevent strokes.  Have regular diabetes screenings. This involves taking a blood sample to check your fasting blood sugar level.  If you are at a normal weight and have a low risk for diabetes, have this test once every three years after 38 years of age.  If you are overweight and have a high risk for diabetes, consider being tested at a younger age or more often. PREVENTING INFECTION  Hepatitis B  If you have a higher risk for hepatitis B, you should be screened for this virus. You are considered at high risk for hepatitis B if:  You were born in a country where hepatitis B is common. Ask your health care provider which countries are considered high risk.  Your parents were born in a high-risk country, and you have not been immunized against hepatitis B (hepatitis B vaccine).  You have HIV or AIDS.  You use needles to inject street drugs.  You live with someone who has hepatitis B.  You have had sex with someone who has hepatitis B.  You get hemodialysis treatment.  You take certain medicines for conditions, including cancer, organ transplantation, and autoimmune conditions. Hepatitis C  Blood testing is recommended for:  Everyone born from [birth year] through 1963.  Anyone with known risk factors for hepatitis C. Sexually transmitted infections (STIs)  You should be screened for sexually transmitted infections (STIs) including gonorrhea and chlamydia if:  You are sexually active and are younger than 38 years of age.  You are older than 38 years of age and your health care provider tells you that you are at risk for this type of infection.  Your sexual activity has changed since you were last screened and you are at an increased risk for chlamydia or gonorrhea. Ask your health  care provider if you are at risk.  If you do not have HIV, but are at risk, it may be recommended that you take a prescription medicine daily to prevent HIV infection. This is called pre-exposure prophylaxis (PrEP). You are considered at risk if:  You are sexually active and do not regularly use condoms or know the HIV status of your partner(s).  You take drugs by injection.  You are sexually active with a partner who has HIV.  Talk with your health care provider about whether you are at high risk of being infected with HIV. If you choose to begin PrEP, you should first be tested for HIV. You should then be tested every 3 months for as long as you are taking PrEP.  PREGNANCY   If you are premenopausal and you may become pregnant, ask your health care provider about preconception counseling.  If you may become pregnant, take 400 to 800 micrograms (mcg) of folic acid every day.  If you want to prevent pregnancy, talk to your health care provider about birth control (contraception). OSTEOPOROSIS AND MENOPAUSE   Osteoporosis is a disease in which the bones lose minerals and strength with aging. This can result in serious bone fractures. Your risk for osteoporosis can be identified using a bone density scan.  If you are 35 years of age or older, or if you are at risk for osteoporosis and fractures, ask your health care provider if you should be screened.  Ask your health care provider whether you should take a calcium or vitamin D supplement to lower your risk for osteoporosis.  Menopause may have certain physical symptoms and risks.  Hormone replacement therapy may reduce some of these symptoms and risks. Talk to your health care provider about whether hormone replacement therapy is right for you.  HOME CARE INSTRUCTIONS   Schedule regular health, dental, and eye exams.  Stay current with your immunizations.   Do not use any tobacco products including cigarettes, chewing tobacco, or  electronic cigarettes.  If you are pregnant, do not drink alcohol.  If you are breastfeeding, limit how much and how often you drink alcohol.  Limit alcohol intake to no more than 1 drink per day for nonpregnant women. One drink equals 12 ounces of beer, 5 ounces of wine, or 1 ounces of hard liquor.  Do not use street drugs.  Do not share needles.  Ask your health care provider for help if you need support or information about quitting drugs.  Tell your health care provider if you often feel depressed.  Tell your health care provider if you have ever been abused or do not feel safe at home.   This information is not intended to replace advice given to you by your health care provider. Make sure you discuss any questions you have with your health care provider.   Document Released: 09/19/2010 Document Revised: 03/27/2014 Document Reviewed: 02/05/2013 Elsevier Interactive Patient Education [employer].

## 2015-10-11 NOTE — Progress Notes (Signed)
Patient ID: Kathryn Munoz, female   DOB: 20-Sep-1975, 40 y.o.   MRN: [id number]   Subjective:   Kathryn Munoz is a 40 y.o. female here for a complete physical exam  Interim issues since last visit: she got reading glasses, low dose Last week she had a feeling in her throat like a lump; she is not sure if anxiety or irritation; felt like her ears were a little bit funny; no pain, both ears felt funny; no drainage; no sneezing, no watery eyes; not sure if anxiety; had some palpitations; no SHOB; had been short-staffed at work, big event and was planning for reunion; sleeping okay; no sweating or fevers  Hypothyroidism; feels pretty good; energy level has definitely increased; going to the gym a lot; walking with group; trying to eat well  Periods had been irregular; heavy in June and July; no other events; nothing to make her think she had been pregnant; has Mirena; had GYN put that in; does not check monthly for string  HIV testing already done  Past Medical History:  Diagnosis Date  . Abnormal mammogram    breast biopsy, [facility] 2014  . Dermatitis   . History of gestational diabetes   . History of kidney stones 2014  . Hypothyroidism   . IFG (impaired fasting glucose)   . Irregular periods   . Neuropathy (HCC)   . Obesity   . Vitamin D deficiency disease    Past Surgical History:  Procedure Laterality Date  . BREAST BIOPSY  2014   [facility]  . INTRAUTERINE DEVICE INSERTION  03/11/2012  MD note: IUD insertion 03/11/2012  Family History  Problem Relation Age of Onset  . Diabetes Mother   . Pancreatitis Mother   . Hypertension Mother   . Diabetes Sister     pre-diabetic  . Diabetes Brother   . ADD / ADHD Son   . Eczema Son    Social History  Substance Use Topics  . Smoking status: Never Smoker  . Smokeless tobacco: Never Used  . Alcohol use No   Review of Systems  Objective:   Vitals:   10/11/15 1447  BP: 116/72  Pulse: 70  Resp: 16  Temp: 98.4 F (36.9 C)   TempSrc: Oral  SpO2: 97%  Weight: 226 lb (102.5 kg)   Body mass index is 37.61 kg/m. Wt Readings from Last 3 Encounters:  10/11/15 226 lb (102.5 kg)  10/09/14 234 lb (106.1 kg)  05/13/14 240 lb (108.9 kg)   Physical Exam  Constitutional: She appears well-developed and well-nourished.  HENT:  Head: Normocephalic and atraumatic.  Right Ear: Hearing, tympanic membrane, external ear and ear canal normal. No middle ear effusion.  Left Ear: Hearing, tympanic membrane, external ear and ear canal normal.  No middle ear effusion.  Mouth/Throat: Oropharynx is clear and moist and mucous membranes are normal.  Eyes: Conjunctivae and EOM are normal. Right eye exhibits no hordeolum. Left eye exhibits no hordeolum. No scleral icterus.  Neck: No JVD present. Thyromegaly (mild uniform thyromegaly versus adipose tissue) present.  Cardiovascular: Normal rate, regular rhythm, S1 normal, S2 normal and normal heart sounds.   No extrasystoles are present.  Pulmonary/Chest: Effort normal and breath sounds normal. No respiratory distress. Right breast exhibits no inverted nipple, no mass, no nipple discharge, no skin change and no tenderness. Left breast exhibits no inverted nipple, no mass, no nipple discharge, no skin change and no tenderness. Breasts are symmetrical.  Abdominal: Soft. Normal appearance and bowel sounds are  normal. She exhibits no distension, no abdominal bruit, no pulsatile midline mass and no mass. There is no hepatosplenomegaly. There is no tenderness. No hernia.  Genitourinary: No erythema or bleeding in the vagina. No foreign body in the vagina. No vaginal discharge found.  Genitourinary Comments: I could not see the cervix, and could not palpate a string emanating from the cervical os  Musculoskeletal: Normal range of motion. She exhibits no edema.  Lymphadenopathy:       Head (right side): No submandibular adenopathy present.       Head (left side): No submandibular adenopathy present.     She has no cervical adenopathy.    She has no axillary adenopathy.  Neurological: She is alert. She displays no tremor. She exhibits normal muscle tone. Gait normal.  Reflex Scores:      Patellar reflexes are 2+ on the right side and 2+ on the left side. Skin: Skin is warm and dry. No bruising and no ecchymosis noted. No cyanosis. No pallor.  Changes around the nape of the neck consistent with acanthosis nigricans  Psychiatric: Her speech is normal and behavior is normal. Thought content normal. Her mood appears not anxious. She does not exhibit a depressed mood.    Assessment/Plan:   Problem List Items Addressed This Visit      Respiratory   Lump in throat    Order thyroid test and [place]        Endocrine   IFG (impaired fasting glucose)    Check A1c      Relevant Orders   Hemoglobin A1c   Hypothyroidism    Check labs today        Other   Vitamin D deficiency disease    Last was normal; start 1,000 iu vitamin D3 daily      Preventative health care    USPSTF grade A and B recommendations reviewed with patient; age-appropriate recommendations, preventive care, screening tests, etc discussed and encouraged; healthy living encouraged; see AVS for patient education given to patient      Relevant Orders   TSH   Comprehensive metabolic panel   Lipid panel   CBC with Differential/Platelet   Obesity    Praise given for weight loss      IUD check up    Unable to locate string; heavy menses June and July; order [place] to verify placement of IUD; back-up contraception or abstinence until then to lessen chance of unintended pregnancy      Relevant Orders   US Pelvis Limited   Irregular periods    Check today for Mirena; check TSH      Relevant Orders   US Pelvis Limited    Other Visit Diagnoses    Need for Tdap vaccination    -  Primary   Relevant Orders   Tdap vaccine greater than or equal to 7yo IM (Completed)      No orders of the defined types were placed in  this encounter.  Orders Placed This Encounter  Procedures  . US Pelvis Limited    Standing Status:   Future    Standing Expiration Date:   12/11/2016    Order Specific Question:   Reason for Exam (SYMPTOM  OR DIAGNOSIS REQUIRED)    Answer:   verify if IUD in uterus    Order Specific Question:   Preferred imaging location?    Answer:   [facility]  . Tdap vaccine greater than or equal to 7yo IM  . TSH  .  Hemoglobin A1c  . Comprehensive metabolic panel    Order Specific Question:   Has the patient fasted?    Answer:   No  . Lipid panel    Order Specific Question:   Has the patient fasted?    Answer:   No  . CBC with Differential/Platelet   Follow up plan: Return in about 1 year (around 10/10/2016) for complete physical.  An After Visit Summary was printed and given to the patient.

## 2015-10-11 NOTE — Assessment & Plan Note (Signed)
Unable to locate string; heavy menses June and July; order [place] to verify placement of IUD; back-up contraception or abstinence until then to lessen chance of unintended pregnancy

## 2015-10-11 NOTE — Assessment & Plan Note (Signed)
Praise given for weight loss 

## 2015-10-11 NOTE — Assessment & Plan Note (Signed)
USPSTF grade A and B recommendations reviewed with patient; age-appropriate recommendations, preventive care, screening tests, etc discussed and encouraged; healthy living encouraged; see AVS for patient education given to patient  

## 2015-10-11 NOTE — Assessment & Plan Note (Signed)
Check today for Mirena; check TSH

## 2015-10-12 ENCOUNTER — Other Ambulatory Visit: Payer: Self-pay

## 2015-10-12 DIAGNOSIS — Z975 Presence of (intrauterine) contraceptive device: Secondary | ICD-10-CM

## 2015-10-12 LAB — HEMOGLOBIN A1C
HEMOGLOBIN A1C: 4.7 % (ref <5.7)
Mean Plasma Glucose: 88 mg/dL

## 2015-10-15 ENCOUNTER — Ambulatory Visit
Admission: RE | Admit: 2015-10-15 | Discharge: 2015-10-15 | Disposition: A | Discharge status: Home / Self Care | Payer: BC Managed Care – PPO | Source: Ambulatory Visit | Attending: Family Medicine | Admitting: Family Medicine

## 2015-10-15 DIAGNOSIS — N8302 Follicular cyst of left ovary: Secondary | ICD-10-CM | POA: Insufficient documentation

## 2015-10-15 DIAGNOSIS — Z30431 Encounter for routine checking of intrauterine contraceptive device: Secondary | ICD-10-CM | POA: Insufficient documentation

## 2015-10-15 DIAGNOSIS — Z975 Presence of (intrauterine) contraceptive device: Secondary | ICD-10-CM

## 2015-10-19 ENCOUNTER — Telehealth: Payer: Self-pay | Admitting: Family Medicine

## 2015-10-19 DIAGNOSIS — T8332XA Displacement of intrauterine contraceptive device, initial encounter: Secondary | ICD-10-CM

## 2015-10-19 DIAGNOSIS — N83202 Unspecified ovarian cyst, left side: Secondary | ICD-10-CM

## 2015-10-19 DIAGNOSIS — T8389XA Other specified complication of genitourinary prosthetic devices, implants and grafts, initial encounter: Principal | ICD-10-CM

## 2015-10-19 NOTE — Telephone Encounter (Signed)
I reviewed report; IUD is actually in her cervix; she has a cyst I called to discuss, reached voicemail; not allowed to leave a msg, "call again later" type message ---------------------------- Staff, Please call patient and let her know that her IUD is actually in her cervix, not up in her uterus, so we'll need her to see gyn to see about this (referral entered) She also has a cyst on her left ovary which does not appear worrisome at all Lastly, can you ask radiology to look at the thickness of the endometrium; I am sure they meant for that to be six millimeters, not six centimeters, but we want to give them the chance to append that

## 2015-10-19 NOTE — Assessment & Plan Note (Signed)
Refer to GYN. 

## 2015-10-20 NOTE — Telephone Encounter (Signed)
Left voice mail

## 2015-10-28 NOTE — Telephone Encounter (Signed)
Please address with radiology and patient so we can close out note; thank you

## 2015-10-29 NOTE — Telephone Encounter (Signed)
Called radiology and notified

## 2015-11-03 ENCOUNTER — Ambulatory Visit (INDEPENDENT_AMBULATORY_CARE_PROVIDER_SITE_OTHER): Payer: BC Managed Care – PPO | Admitting: Obstetrics and Gynecology

## 2015-11-03 ENCOUNTER — Encounter: Payer: Self-pay | Admitting: Obstetrics and Gynecology

## 2015-11-03 VITALS — BP 112/72 | HR 67 | Ht 65.0 in | Wt 226.8 lb

## 2015-11-03 DIAGNOSIS — T8389XD Other specified complication of genitourinary prosthetic devices, implants and grafts, subsequent encounter: Secondary | ICD-10-CM | POA: Diagnosis not present

## 2015-11-03 DIAGNOSIS — T8332XD Displacement of intrauterine contraceptive device, subsequent encounter: Secondary | ICD-10-CM

## 2015-11-03 DIAGNOSIS — N83202 Unspecified ovarian cyst, left side: Secondary | ICD-10-CM

## 2015-11-03 DIAGNOSIS — N939 Abnormal uterine and vaginal bleeding, unspecified: Secondary | ICD-10-CM | POA: Diagnosis not present

## 2015-11-03 DIAGNOSIS — Z975 Presence of (intrauterine) contraceptive device: Secondary | ICD-10-CM

## 2015-11-03 LAB — POCT URINE PREGNANCY: PREG TEST UR: NEGATIVE

## 2015-11-03 MED ORDER — LEVONORGESTREL 20 MCG/24HR IU IUD: INTRAUTERINE_SYSTEM | Freq: Once | INTRAUTERINE | Status: DC

## 2015-11-03 NOTE — Patient Instructions (Signed)
Intrauterine Device Insertion, Care After Refer to this sheet in the next few weeks. These instructions provide you with information on caring for yourself after your procedure. Your health care provider may also give you more specific instructions. Your treatment has been planned according to current medical practices, but problems sometimes occur. Call your health care provider if you have any problems or questions after your procedure. WHAT TO EXPECT AFTER THE PROCEDURE Insertion of the IUD may cause some discomfort, such as cramping. The cramping should improve after the IUD is in place. You may have bleeding after the procedure. This is normal. It varies from light spotting for a few days to menstrual-like bleeding. When the IUD is in place, a string will extend past the cervix into the vagina for 1-2 inches. The strings should not bother you or your partner. If they do, talk to your health care provider.  HOME CARE INSTRUCTIONS   Check your intrauterine device (IUD) to make sure it is in place before you resume sexual activity. You should be able to feel the strings. If you cannot feel the strings, something may be wrong. The IUD may have fallen out of the uterus, or the uterus may have been punctured (perforated) during placement. Also, if the strings are getting longer, it may mean that the IUD is being forced out of the uterus. You no longer have full protection from pregnancy if any of these problems occur.  You may resume sexual intercourse if you are not having problems with the IUD. The copper IUD is considered immediately effective, and the hormone IUD works right away if inserted within 7 days of your period starting. You will need to use a backup method of birth control for 7 days if the IUD in inserted at any other time in your cycle.  Continue to check that the IUD is still in place by feeling for the strings after every menstrual period.  You may need to take pain medicine such as  acetaminophen or ibuprofen. Only take medicines as directed by your health care provider. SEEK MEDICAL CARE IF:   You have bleeding that is heavier or lasts longer than a normal menstrual cycle.  You have a fever.  You have increasing cramps or abdominal pain not relieved with medicine.  You have abdominal pain that does not seem to be related to the same area of earlier cramping and pain.  You are lightheaded, unusually weak, or faint.  You have abnormal vaginal discharge or smells.  You have pain during sexual intercourse.  You cannot feel the IUD strings, or the IUD string has gotten longer.  You feel the IUD at the opening of the cervix in the vagina.  You think you are pregnant, or you miss your menstrual period.  The IUD string is hurting your sex partner. MAKE SURE YOU:  Understand these instructions.  Will watch your condition.  Will get help right away if you are not doing well or get worse.   This information is not intended to replace advice given to you by your health care provider. Make sure you discuss any questions you have with your health care provider.   Document Released: 11/02/2010 Document Revised: 12/25/2012 Document Reviewed: 08/25/2012 Elsevier Interactive Patient Education [employer]2016 Elsevier Inc.   Return in 4 weeks for IUD check as scheduled

## 2015-11-03 NOTE — Progress Notes (Signed)
GYN ENCOUNTER NOTE  Subjective:       Kathryn Munoz is a 40 y.o. [id number] female is here for gynecologic evaluation of the following issues:   1. Malpositioned IUD  2. Left ovarian cyst  3. Abnormal uterine bleeding  Patient presents in referral from Dr. Lelon FrohlichLadda for management of IUD complication-migration. Patient noted onset of heavy bleeding over the past several months in association with painful intercourse. Pelvic ultrasound demonstrated the IUD to be located in the cervix; a simple functional cyst was also noted in The left ovary. The Mirena IUD was inserted 2013.   Gynecologic History Patient's last menstrual period was 09/24/2015. Contraception: IUD  Obstetric History OB History  Gravida Para Term Preterm AB Living  2 2 1 1   2   SAB TAB Ectopic Multiple Live Births          2    # Outcome Date GA Lbr Len/2nd Weight Sex Delivery Anes PTL Lv  2 Term 2008   8 lb 12.8 oz (3.992 kg) M Vag-Spont   LIV  1 Preterm 2001   4 lb (1.814 kg) M Vag-Spont   LIV     Complications: GDM (gestational diabetes mellitus)      Past Medical History:  Diagnosis Date  . Abnormal mammogram    breast biopsy, [facility] 2014  . Dermatitis   . History of gestational diabetes   . History of kidney stones 2014  . Hypothyroidism   . IFG (impaired fasting glucose)   . Irregular periods   . Neuropathy (HCC)   . Obesity   . Vitamin D deficiency disease     Past Surgical History:  Procedure Laterality Date  . BREAST BIOPSY  2014   PASH  . INTRAUTERINE DEVICE INSERTION  03/11/2012    Current Outpatient Prescriptions on File Prior to Visit  Medication Sig Dispense Refill  . levonorgestrel (MIRENA) 20 MCG/24HR IUD 1 each by Intrauterine route once.    Marland Kitchen. levothyroxine (SYNTHROID, LEVOTHROID) 50 MCG tablet Take 1 tablet (50 mcg total) by mouth daily before breakfast. 30 tablet 1   No current facility-administered medications on file prior to visit.     No Known Allergies  Social History    Social History  . Marital status: Married    Spouse name: N/A  . Number of children: N/A  . Years of education: N/A   Occupational History  . Not on file.   Social History Main Topics  . Smoking status: Never Smoker  . Smokeless tobacco: Never Used  . Alcohol use No  . Drug use: No  . Sexual activity: Yes    Birth control/ protection: IUD   Other Topics Concern  . Not on file   Social History Narrative  . No narrative on file    Family History  Problem Relation Age of Onset  . Diabetes Mother   . Pancreatitis Mother   . Hypertension Mother   . Diabetes Sister     pre-diabetic  . Diabetes Brother   . ADD / ADHD Son   . Eczema Son   . Ovarian cancer Neg Hx   . Breast cancer Neg Hx     The following portions of the patient's history were reviewed and updated as appropriate: allergies, current medications, past family history, past medical history, past social history, past surgical history and problem list.  Review of Systems Review of Systems-Per history of present illness  Objective:   BP 112/72   Pulse 67   Ht  5\' 5"  (1.651 m)   Wt 226 lb 12.8 oz (102.9 kg)   LMP 09/24/2015   BMI 37.74 kg/m  CONSTITUTIONAL: Well-developed, well-nourished female in no acute distress.  HENT:  Normocephalic, atraumatic.  NECK:Not examined SKIN: Skin is warm and dry. No rash noted. Not diaphoretic. No erythema. No pallor. NEUROLGIC: Alert and oriented to person, place, and time. PSYCHIATRIC: Normal mood and affect. Normal behavior. Normal judgment and thought content. CARDIOVASCULAR:Not Examined RESPIRATORY: Not Examined BREASTS: Not Examined ABDOMEN: Soft, non distended; Non tender.  No Organomegaly. PELVIC:  External Genitalia: Normal  BUS: Normal  Vagina: Normal  Cervix: Normal  Uterus: Normal size, shape,consistency, mobile; midplane  Adnexa: Normal  RV: Normal   Bladder: Nontender MUSCULOSKELETAL: Normal range of motion. No tenderness.  No cyanosis, clubbing,  or edema.  PROCEDURE: Removal and reinsertion of IUD. Removal-[place] forceps is used to grasp the IUD strings and with gentle traction the original IUD was removed without difficulty. Insertion-IUD Insertion Procedure Note  Pre-operative Diagnosis: Contraception  Post-operative Diagnosis: Contraception  Procedure Details  Urine pregnancy test was done.  The risks (including infection, bleeding, pain, and uterine perforation) and benefits of the procedure were explained to the patient and Verbal informed consent was obtained.    Cervix cleansed with Betadine. Uterus sounded to 9 cm. IUD inserted without difficulty. String visible and trimmed to 2.5 cm. Patient tolerated procedure well.  IUD Information: Mirena, Lot # [id number], Expiration date 02/20.  Condition: Stable  Complications: None  Plan:  The patient was advised to call for any fever or for prolonged or severe pain or bleeding. She was advised to use OTC acetaminophen and OTC ibuprofen as needed for mild to moderate pain.   Attending Physician Documentation: Herold HarmsMartin A Redonna Wilbert, MD      Assessment:   1. Contraception, device intrauterine - POCT urine pregnancy   2. Malpositioned IUD  3. Left ovarian cyst, simple  4. Abnormal uterine bleeding Plan:   1. IUD removal as noted 2. Mirena IUD insertion as noted 3. Post procedure instructions given 4. Return in 4 weeks for IUD string check  A total of 20 minutes were spent face-to-face with the patient during this encounter and over half of that time dealt with counseling and coordination of care.  Herold HarmsMartin A Rainee Sweatt, MD  Note: This dictation was prepared with Dragon dictation along with smaller phrase technology. Any transcriptional errors that result from this process are unintentional.

## 2015-11-12 ENCOUNTER — Other Ambulatory Visit: Payer: Self-pay | Admitting: Family Medicine

## 2015-11-24 ENCOUNTER — Encounter: Payer: BC Managed Care – PPO | Admitting: Obstetrics and Gynecology

## 2015-12-01 ENCOUNTER — Ambulatory Visit (INDEPENDENT_AMBULATORY_CARE_PROVIDER_SITE_OTHER): Payer: BC Managed Care – PPO | Admitting: Obstetrics and Gynecology

## 2015-12-01 ENCOUNTER — Ambulatory Visit (INDEPENDENT_AMBULATORY_CARE_PROVIDER_SITE_OTHER): Payer: BC Managed Care – PPO

## 2015-12-01 VITALS — BP 116/74 | HR 66 | Ht 65.0 in | Wt 223.0 lb

## 2015-12-01 DIAGNOSIS — T8389XA Other specified complication of genitourinary prosthetic devices, implants and grafts, initial encounter: Secondary | ICD-10-CM | POA: Diagnosis not present

## 2015-12-01 DIAGNOSIS — Z975 Presence of (intrauterine) contraceptive device: Secondary | ICD-10-CM | POA: Diagnosis not present

## 2015-12-01 DIAGNOSIS — T8332XA Displacement of intrauterine contraceptive device, initial encounter: Secondary | ICD-10-CM

## 2015-12-01 DIAGNOSIS — Z30018 Encounter for initial prescription of other contraceptives: Secondary | ICD-10-CM | POA: Diagnosis not present

## 2015-12-01 MED ORDER — ETONOGESTREL-ETHINYL ESTRADIOL 0.12-0.015 MG/24HR VA RING: VAGINAL_RING | VAGINAL | 12 refills | Status: DC

## 2015-12-01 NOTE — Progress Notes (Signed)
Chief complaint: 1. IUD string check  Patient is 4 weeks status post removal of embedded IUD with subsequent replacement of Mirena IUD. Patient noted heavy cycle this past month and also significant cramping approximately 1 week and has since subsided.  Past medical history, past surgical history, problem list, medications, and allergies are reviewed  OBJECTIVE: BP 116/74   Pulse 66   Ht 5\' 5"  (1.651 m)   Wt 223 lb (101.2 kg)   LMP 11/07/2015   BMI 37.11 kg/m  Pleasant female in no acute distress Abdomen: Soft, nontender Pelvic exam: External genitalia-normal BUS-normal Vagina-normal Cervix-no lesions; IUD string appears elongated measuring about 4-5 cm (previously noted IUD string length was 2.5 cm); no cervical motion tenderness; no palpable IUD at the os Uterus-nonpalpable nontender Adnexa-nonpalpable nontender RV-normal external exam  Ultrasound results today: IUD malposition in endocervical canal  ASSESSMENT: 1. Malposition of IUD  PLAN: 1. IUD removed today with [place] forceps 2. Counseling regarding alternative contraceptive options was completed 3. NuvaRing started today; 2 samples given; prescription sent to pharmacy 4. Return in 3 months for follow-up  A total of 15 minutes were spent face-to-face with the patient during this encounter and over half of that time dealt with counseling and coordination of care.  Herold HarmsMartin A Defrancesco, MD  Note: This dictation was prepared with Dragon dictation along with smaller phrase technology. Any transcriptional errors that result from this process are unintentional.

## 2015-12-01 NOTE — Patient Instructions (Signed)
1. Return in 3 months for follow-up on NuvaRingEthinyl Estradiol; Etonogestrel vaginal ring What is this medicine? ETHINYL ESTRADIOL; ETONOGESTREL (ETH in il es tra DYE ole; et oh noe JES trel) vaginal ring is a flexible, vaginal ring used as a contraceptive (birth control method). This medicine combines two types of female hormones, an estrogen and a progestin. This ring is used to prevent ovulation and pregnancy. Each ring is effective for one month. This medicine may be used for other purposes; ask your health care provider or pharmacist if you have questions. What should I tell my health care provider before I take this medicine? They need to know if you have or ever had any of these conditions: -abnormal vaginal bleeding -blood vessel disease or blood clots -breast, cervical, endometrial, ovarian, liver, or uterine cancer -diabetes -gallbladder disease -heart disease or recent heart attack -high blood pressure -high cholesterol -kidney disease -liver disease -migraine headaches -stroke -systemic lupus erythematosus (SLE) -tobacco smoker -an unusual or allergic reaction to estrogens, progestins, other medicines, foods, dyes, or preservatives -pregnant or trying to get pregnant -breast-feeding How should I use this medicine? Insert the ring into your vagina as directed. Follow the directions on the prescription label. The ring will remain place for 3 weeks and is then removed for a 1-week break. A new ring is inserted 1 week after the last ring was removed, on the same day of the week. Do not use more often than directed. A patient package insert for the product will be given with each prescription and refill. Read this sheet carefully each time. The sheet may change frequently. Contact your pediatrician regarding the use of this medicine in children. Special care may be needed. This medicine has been used in female children who have started having menstrual periods. Overdosage: If you  think you have taken too much of this medicine contact a poison control center or emergency room at once. NOTE: This medicine is only for you. Do not share this medicine with others. What if I miss a dose? You will need to replace your vaginal ring once a month as directed. If the ring should slip out, or if you leave it in longer or shorter than you should, contact your health care professional for advice. What may interact with this medicine? -acetaminophen -antibiotics or medicines for infections, especially rifampin, rifabutin, rifapentine, and griseofulvin, and possibly penicillins or tetracyclines -aprepitant -ascorbic acid (vitamin C) -atorvastatin -barbiturate medicines, such as phenobarbital -bosentan -carbamazepine -caffeine -clofibrate -cyclosporine -dantrolene -doxercalciferol -felbamate -grapefruit juice -hydrocortisone -medicines for anxiety or sleeping problems, such as diazepam or temazepam -medicines for diabetes, including pioglitazone -modafinil -mycophenolate -nefazodone -oxcarbazepine -phenytoin -prednisolone -ritonavir or other medicines for HIV infection or AIDS -rosuvastatin -selegiline -soy isoflavones supplements -St. John's wort -tamoxifen or raloxifene -theophylline -thyroid hormones -topiramate -warfarin This list may not describe all possible interactions. Give your health care provider a list of all the medicines, herbs, non-prescription drugs, or dietary supplements you use. Also tell them if you smoke, drink alcohol, or use illegal drugs. Some items may interact with your medicine. What should I watch for while using this medicine? Visit your doctor or health care professional for regular checks on your progress. You will need a regular breast and pelvic exam and Pap smear while on this medicine. Use an additional method of contraception during the first cycle that you use this ring. If you have any reason to think you are pregnant, stop  using this medicine right away and contact your doctor or  health care professional. If you are using this medicine for hormone related problems, it may take several cycles of use to see improvement in your condition. Smoking increases the risk of getting a blood clot or having a stroke while you are using hormonal birth control, especially if you are more than 38 years old. You are strongly advised not to smoke. This medicine can make your body retain fluid, making your fingers, hands, or ankles swell. Your blood pressure can go up. Contact your doctor or health care professional if you feel you are retaining fluid. This medicine can make you more sensitive to the sun. Keep out of the sun. If you cannot avoid being in the sun, wear protective clothing and use sunscreen. Do not use sun lamps or tanning beds/booths. If you wear contact lenses and notice visual changes, or if the lenses begin to feel uncomfortable, consult your eye care specialist. In some women, tenderness, swelling, or minor bleeding of the gums may occur. Notify your dentist if this happens. Brushing and flossing your teeth regularly may help limit this. See your dentist regularly and inform your dentist of the medicines you are taking. If you are going to have elective surgery, you may need to stop using this medicine before the surgery. Consult your health care professional for advice. This medicine does not protect you against HIV infection (AIDS) or any other sexually transmitted diseases. What side effects may I notice from receiving this medicine? Side effects that you should report to your doctor or health care professional as soon as possible: -breast tissue changes or discharge -changes in vaginal bleeding during your period or between your periods -chest pain -coughing up blood -dizziness or fainting spells -headaches or migraines -leg, arm or groin pain -severe or sudden headaches -stomach pain (severe) -sudden  shortness of breath -sudden loss of coordination, especially on one side of the body -speech problems -symptoms of vaginal infection like itching, irritation or unusual discharge -tenderness in the upper abdomen -vomiting -weakness or numbness in the arms or legs, especially on one side of the body -yellowing of the eyes or skin Side effects that usually do not require medical attention (report to your doctor or health care professional if they continue or are bothersome): -breakthrough bleeding and spotting that continues beyond the 3 initial cycles of pills -breast tenderness -mood changes, anxiety, depression, frustration, anger, or emotional outbursts -increased sensitivity to sun or ultraviolet light -nausea -skin rash, acne, or brown spots on the skin -weight gain (slight) This list may not describe all possible side effects. Call your doctor for medical advice about side effects. You may report side effects to FDA at 1-800-FDA-1088. Where should I keep my medicine? Keep out of the reach of children. Store at room temperature between 15 and 30 degrees C (59 and 86 degrees F) for up to 4 months. The product will expire after 4 months. Protect from light. Throw away any unused medicine after the expiration date. NOTE: This sheet is a summary. It may not cover all possible information. If you have questions about this medicine, talk to your doctor, pharmacist, or health care provider.    2016, Elsevier/[employer] Standard. (2008-02-20 12:03:58)

## 2016-02-29 ENCOUNTER — Encounter: Payer: Self-pay | Admitting: Obstetrics and Gynecology

## 2016-02-29 ENCOUNTER — Ambulatory Visit (INDEPENDENT_AMBULATORY_CARE_PROVIDER_SITE_OTHER): Payer: BC Managed Care – PPO | Admitting: Obstetrics and Gynecology

## 2016-02-29 VITALS — BP 106/69 | HR 69 | Ht 65.0 in | Wt 220.2 lb

## 2016-02-29 DIAGNOSIS — L298 Other pruritus: Secondary | ICD-10-CM

## 2016-02-29 DIAGNOSIS — Z3044 Encounter for surveillance of vaginal ring hormonal contraceptive device: Secondary | ICD-10-CM | POA: Diagnosis not present

## 2016-02-29 DIAGNOSIS — N898 Other specified noninflammatory disorders of vagina: Secondary | ICD-10-CM

## 2016-02-29 MED ORDER — FLUCONAZOLE 150 MG PO TABS: 150.0000 mg | ORAL_TABLET | Freq: Once | ORAL | 0 refills | Status: AC

## 2016-02-29 NOTE — Progress Notes (Signed)
Chief complaint: 1. Contraceptive management 2. Vaginal itching  Patient presents for 3 month follow-up after starting NuvaRing. Previously embedded IUD was removed. Cycles are regular. Bleeding is normal. Patient is experiencing moodiness and depression. She is uncertain if this is occurring due to the medication or the holiday season. Patient is also complaining of vaginal itching. She has not taken any recent antibiotics. She has minimal white secretions noted from the vagina.  Past medical history, past surgical history, problem list, medications, and allergies are reviewed  OBJECTIVE: BP 106/69   Pulse 69   Ht 5\' 5"  (1.651 m)   Wt 220 lb 3.2 oz (99.9 kg)   LMP 02/20/2016 (Exact Date)   BMI 36.64 kg/m  Pleasant female in no acute distress. Alert and oriented. Affect is appropriate. Pelvic exam: External genitalia-normal BUS-normal Vagina-white secretions vaginal vault Cervix-no lesions Uterus-not examined Adnexa-not examined Rectovaginal-normal external exam  PROCEDURE wet prep: Normal saline-few white blood cells; no clue cells; no Trichomonas KOH-no yeast  ASSESSMENT: 1. Possible mood changes noted on NuvaRing. Cycles are regular and not painful 2. Possible Monilia vaginitis  PLAN: 1. Continue with NuvaRing over the next 3 months 2. Diflucan 150 mg orally; may repeat in 3 days if symptoms persist 3. Return in 3 months for follow-up  A total of 15 minutes were spent face-to-face with the patient during this encounter and over half of that time dealt with counseling and coordination of care.  Herold HarmsMartin A Minal Stuller, MD  Note: This dictation was prepared with Dragon dictation along with smaller phrase technology. Any transcriptional errors that result from this process are unintentional.

## 2016-02-29 NOTE — Patient Instructions (Signed)
1. Continue NuvaRing for the next 3 months 2. Diflucan 150 mg orally is given for possible yeast infection 3. Return in 3 months for follow-up 4. Monitor depression symptoms/anxiety symptoms over the next 3 months while on the NuvaRing

## 2016-06-01 ENCOUNTER — Encounter: Payer: Self-pay | Admitting: Obstetrics and Gynecology

## 2016-06-01 ENCOUNTER — Ambulatory Visit (INDEPENDENT_AMBULATORY_CARE_PROVIDER_SITE_OTHER): Payer: BC Managed Care – PPO | Admitting: Obstetrics and Gynecology

## 2016-06-01 VITALS — BP 111/71 | HR 69 | Ht 65.0 in | Wt 222.5 lb

## 2016-06-01 DIAGNOSIS — N939 Abnormal uterine and vaginal bleeding, unspecified: Secondary | ICD-10-CM | POA: Diagnosis not present

## 2016-06-01 DIAGNOSIS — Z3044 Encounter for surveillance of vaginal ring hormonal contraceptive device: Secondary | ICD-10-CM | POA: Diagnosis not present

## 2016-06-01 DIAGNOSIS — F419 Anxiety disorder, unspecified: Secondary | ICD-10-CM | POA: Diagnosis not present

## 2016-06-01 DIAGNOSIS — R635 Abnormal weight gain: Secondary | ICD-10-CM | POA: Diagnosis not present

## 2016-06-01 DIAGNOSIS — R6889 Other general symptoms and signs: Secondary | ICD-10-CM | POA: Diagnosis not present

## 2016-06-01 DIAGNOSIS — E6609 Other obesity due to excess calories: Secondary | ICD-10-CM

## 2016-06-01 DIAGNOSIS — Z6832 Body mass index (BMI) 32.0-32.9, adult: Secondary | ICD-10-CM | POA: Diagnosis not present

## 2016-06-01 MED ORDER — CITALOPRAM HYDROBROMIDE 20 MG PO TABS: 20.0000 mg | ORAL_TABLET | Freq: Every day | ORAL | 6 refills | Status: DC

## 2016-06-01 MED ORDER — CITALOPRAM HYDROBROMIDE 20 MG PO TABS: 20.0000 mg | ORAL_TABLET | Freq: Every day | ORAL | 2 refills | Status: DC

## 2016-06-01 NOTE — Progress Notes (Signed)
GYN ENCOUNTER NOTE  Subjective:       Kathryn Munoz is a 41 y.o. [id number] female is here for gynecologic evaluation of the following issues:  1. 6 mo follow-up for Nuvaring 2. Inconsistent menstrual periods  Patient reports regular cyclic bleeding on NuvaRing; however it is variable from being light to heavy year; it does come on at the appropriate interval. She is comfortable with remaining on this medication.  Patient also reports increased anxiety, mood changes, irritability and anger. She does not have any suicidal ideations. She is interested in going back on her SSRI medication we had worked control her symptoms. She does not report any side effects from the medicine when she was on the citalopram previously.  Patient states that she needs to continue work to work on weight loss. She is strongly encouraged to regulate her eating practices and exercise.  Obstetric History OB History  Gravida Para Term Preterm AB Living  2 2 1 1   2   SAB TAB Ectopic Multiple Live Births          2    # Outcome Date GA Lbr Len/2nd Weight Sex Delivery Anes PTL Lv  2 Term 2008   8 lb 12.8 oz (3.992 kg) M Vag-Spont   LIV  1 Preterm 2001   4 lb (1.814 kg) M Vag-Spont   LIV     Complications: GDM (gestational diabetes mellitus)      Past Medical History:  Diagnosis Date  . Abnormal mammogram    breast biopsy, [facility] 2014  . Dermatitis   . History of gestational diabetes   . History of kidney stones 2014  . Hypothyroidism   . IFG (impaired fasting glucose)   . Irregular periods   . Neuropathy (HCC)   . Obesity   . Vitamin D deficiency disease     Past Surgical History:  Procedure Laterality Date  . BREAST BIOPSY  2014   PASH  . INTRAUTERINE DEVICE INSERTION  03/11/2012    Current Outpatient Prescriptions on File Prior to Visit  Medication Sig Dispense Refill  . Cholecalciferol (VITAMIN D3) 1000 units CAPS Take by mouth.    . etonogestrel-ethinyl estradiol (NUVARING) 0.12-0.015 MG/24HR  vaginal ring Insert vaginally and leave in place for 3 consecutive weeks, then remove for 1 week. 1 each 12  . levothyroxine (SYNTHROID, LEVOTHROID) 50 MCG tablet TAKE 1 TABLET (50 MCG TOTAL) BY MOUTH DAILY BEFORE BREAKFAST. 30 tablet 10   No current facility-administered medications on file prior to visit.     No Known Allergies  Social History   Social History  . Marital status: Married    Spouse name: N/A  . Number of children: N/A  . Years of education: N/A   Occupational History  . Not on file.   Social History Main Topics  . Smoking status: Never Smoker  . Smokeless tobacco: Never Used  . Alcohol use No  . Drug use: No  . Sexual activity: Yes    Birth control/ protection: IUD, Other-see comments   Other Topics Concern  . Not on file   Social History Narrative  . No narrative on file    Family History  Problem Relation Age of Onset  . Diabetes Mother   . Pancreatitis Mother   . Hypertension Mother   . Diabetes Sister     pre-diabetic  . Diabetes Brother   . ADD / ADHD Son   . Eczema Son   . Ovarian cancer Neg Hx   .  Breast cancer Neg Hx     The following portions of the patient's history were reviewed and updated as appropriate: allergies, current medications, past family history, past medical history, past social history, past surgical history and problem list.  Review of Systems Review of Systems  Constitutional: Positive for malaise/fatigue. Negative for chills, fever and weight loss.       Cold intolerance Weight gain  HENT: Negative.   Respiratory: Negative.   Cardiovascular: Negative.   Gastrointestinal: Negative for abdominal pain, diarrhea, nausea and vomiting.  Genitourinary: Negative.   Musculoskeletal: Negative.   Skin: Negative.   Neurological: Negative for weakness and headaches.  Endo/Heme/Allergies: Negative.   Psychiatric/Behavioral: Positive for depression. Negative for substance abuse and suicidal ideas. The patient is  nervous/anxious.       Objective:   BP 111/71   Pulse 69   Ht 5\' 5"  (1.651 m)   Wt 222 lb 8 oz (100.9 kg)   LMP 05/15/2016 (Approximate)   BMI 37.03 kg/m  CONSTITUTIONAL: Well-developed, well-nourished female in no acute distress.  Physical exam-deferred   Assessment:   1. Encounter for surveillance of vaginal ring hormonal contraceptive device  2. Abnormal uterine bleeding, variable withdrawal bleed   3 Class 1 obesity due to excess calories without serious comorbidity with body mass index (BMI) of 32.0 to 32.9 in adult  4. Cold intolerance - TSH - CBC with Differential/Platelet  5. Weight gain - TSH - CBC with Differential/Platelet  6. Anxiety, desires restart of citalopram     Plan:   1. Continue NuvaRing 2. Maintain menstrual calendar monitoring for abnormal uterine bleeding 3. Recommend pelvic eating with exercise for controlled weight loss 4. Return in 6 months for follow-up 5. ReStart citalopram 6. CBC, TSH

## 2016-06-01 NOTE — Patient Instructions (Addendum)
1. Continue NuvaRing 2. Maintain menstrual calendar monitoring for abnormal uterine bleeding 3. Recommend pelvic eating with exercise for controlled weight loss 4. Return in 6 months for follow-up 5. Refill citalopram

## 2016-06-02 LAB — CBC WITH DIFFERENTIAL/PLATELET
BASOS ABS: 0 10*3/uL (ref 0.0–0.2)
BASOS: 1 %
EOS (ABSOLUTE): 0.1 10*3/uL (ref 0.0–0.4)
Eos: 1 %
Hematocrit: 40.2 % (ref 34.0–46.6)
Hemoglobin: 13.2 g/dL (ref 11.1–15.9)
IMMATURE GRANS (ABS): 0 10*3/uL (ref 0.0–0.1)
IMMATURE GRANULOCYTES: 0 %
LYMPHS: 36 %
Lymphocytes Absolute: 2.5 10*3/uL (ref 0.7–3.1)
MCH: 30.7 pg (ref 26.6–33.0)
MCHC: 32.8 g/dL (ref 31.5–35.7)
MCV: 94 fL (ref 79–97)
Monocytes Absolute: 0.2 10*3/uL (ref 0.1–0.9)
Monocytes: 3 %
NEUTROS PCT: 59 %
Neutrophils Absolute: 4.2 10*3/uL (ref 1.4–7.0)
PLATELETS: 371 10*3/uL (ref 150–379)
RBC: 4.3 x10E6/uL (ref 3.77–5.28)
RDW: 12 % — ABNORMAL LOW (ref 12.3–15.4)
WBC: 7.1 10*3/uL (ref 3.4–10.8)

## 2016-06-02 LAB — TSH: TSH: 3.05 u[IU]/mL (ref 0.450–4.500)

## 2016-09-07 ENCOUNTER — Other Ambulatory Visit: Payer: Self-pay | Admitting: Family Medicine

## 2016-09-07 NOTE — Telephone Encounter (Signed)
Last thyroid checked March 2018 rx approved Lab Results  Component Value Date   TSH 3.050 06/01/2016

## 2016-10-16 ENCOUNTER — Encounter: Payer: Self-pay | Admitting: Family Medicine

## 2016-10-16 ENCOUNTER — Ambulatory Visit (INDEPENDENT_AMBULATORY_CARE_PROVIDER_SITE_OTHER): Payer: BC Managed Care – PPO | Admitting: Family Medicine

## 2016-10-16 VITALS — BP 118/70 | HR 87 | Temp 98.3°F | Resp 14 | Ht 65.01 in | Wt 234.8 lb

## 2016-10-16 DIAGNOSIS — H00015 Hordeolum externum left lower eyelid: Secondary | ICD-10-CM | POA: Diagnosis not present

## 2016-10-16 DIAGNOSIS — R635 Abnormal weight gain: Secondary | ICD-10-CM

## 2016-10-16 DIAGNOSIS — L83 Acanthosis nigricans: Secondary | ICD-10-CM

## 2016-10-16 DIAGNOSIS — Z0000 Encounter for general adult medical examination without abnormal findings: Secondary | ICD-10-CM | POA: Diagnosis not present

## 2016-10-16 DIAGNOSIS — E559 Vitamin D deficiency, unspecified: Secondary | ICD-10-CM

## 2016-10-16 DIAGNOSIS — E669 Obesity, unspecified: Secondary | ICD-10-CM | POA: Diagnosis not present

## 2016-10-16 LAB — CBC WITH DIFFERENTIAL/PLATELET
BASOS PCT: 0 %
Basophils Absolute: 0 cells/uL (ref 0–200)
EOS PCT: 2 %
Eosinophils Absolute: 140 cells/uL (ref 15–500)
HEMATOCRIT: 38.3 % (ref 35.0–45.0)
HEMOGLOBIN: 12.2 g/dL (ref 11.7–15.5)
LYMPHS ABS: 2800 {cells}/uL (ref 850–3900)
Lymphocytes Relative: 40 %
MCH: 30.8 pg (ref 27.0–33.0)
MCHC: 31.9 g/dL — ABNORMAL LOW (ref 32.0–36.0)
MCV: 96.7 fL (ref 80.0–100.0)
MONO ABS: 280 {cells}/uL (ref 200–950)
MPV: 8.7 fL (ref 7.5–12.5)
Monocytes Relative: 4 %
Neutro Abs: 3780 cells/uL (ref 1500–7800)
Neutrophils Relative %: 54 %
Platelets: 334 10*3/uL (ref 140–400)
RBC: 3.96 MIL/uL (ref 3.80–5.10)
RDW: 12.2 % (ref 11.0–15.0)
WBC: 7 10*3/uL (ref 3.8–10.8)

## 2016-10-16 LAB — COMPLETE METABOLIC PANEL WITH GFR
ALBUMIN: 4 g/dL (ref 3.6–5.1)
ALT: 8 U/L (ref 6–29)
AST: 15 U/L (ref 10–30)
Alkaline Phosphatase: 47 U/L (ref 33–115)
BUN: 9 mg/dL (ref 7–25)
CALCIUM: 9 mg/dL (ref 8.6–10.2)
CHLORIDE: 107 mmol/L (ref 98–110)
CO2: 23 mmol/L (ref 20–31)
CREATININE: 0.74 mg/dL (ref 0.50–1.10)
GFR, Est African American: >89 mL/min (ref >=60)
GFR, Est Non African American: >89 mL/min (ref >=60)
GLUCOSE: 80 mg/dL (ref 65–99)
Potassium: 4.5 mmol/L (ref 3.5–5.3)
SODIUM: 140 mmol/L (ref 135–146)
Total Bilirubin: 0.8 mg/dL (ref 0.2–1.2)
Total Protein: 6.8 g/dL (ref 6.1–8.1)

## 2016-10-16 LAB — LIPID PANEL
CHOL/HDL RATIO: 2.4 ratio (ref <5.0)
CHOLESTEROL: 153 mg/dL (ref <200)
HDL: 63 mg/dL (ref >50)
LDL Cholesterol: 76 mg/dL (ref <100)
Triglycerides: 69 mg/dL (ref <150)
VLDL: 14 mg/dL (ref <30)

## 2016-10-16 LAB — TSH: TSH: 2.48 m[IU]/L

## 2016-10-16 NOTE — Assessment & Plan Note (Signed)
USPSTF grade A and B recommendations reviewed with patient; age-appropriate recommendations, preventive care, screening tests, etc discussed and encouraged; healthy living encouraged; see AVS for patient education given to patient  

## 2016-10-16 NOTE — Progress Notes (Signed)
Patient ID: Kathryn Munoz, female   DOB: [birth year], 41 y.o.   MRN: [id number]   Subjective:   Kathryn Munoz is a 41 y.o. female here for a complete physical exam  Interim issues since last visit: about first week of July, BP went really high; dizzy, off balance for 2-3 days; feeling okay now; not sure if dehydrated; a few episodes with blood sugar fluctuating, got a little sweaty and hot and shaky  USPSTF grade A and B recommendations Depression:  Depression screen [facility] 2/9 10/16/2016 10/11/2015  Decreased Interest 0 0  Down, Depressed, Hopeless 1 0  PHQ - 2 Score 1 0   Hypertension: BP Readings from Last 3 Encounters:  10/16/16 118/70  06/01/16 111/71  02/29/16 106/69   Obesity: really busy and fell off the wagon ready to jump back on Wt Readings from Last 3 Encounters:  10/16/16 234 lb 12.8 oz (106.5 kg)  06/01/16 222 lb 8 oz (100.9 kg)  02/29/16 220 lb 3.2 oz (99.9 kg)   BMI Readings from Last 3 Encounters:  10/16/16 39.06 kg/m  06/01/16 37.03 kg/m  02/29/16 36.64 kg/m    Alcohol: no Tobacco use:  no HIV, hep B, hep C: not interested STD testing and prevention (chl/gon/syphilis): not interested Intimate partner violence:no above Breast cancer: today CBE, no lumps BRCA gene screening:  Cervical cancer screening: here October 09, 2014, HPV negative Contraception: Mirena Osteoporosis: n/a Fall prevention/vitamin D: not taking, tired Lipids: fasting for blood work Lab Results  Component Value Date   CHOL 183 10/11/2015   CHOL 181 10/09/2014   Lab Results  Component Value Date   HDL 51 10/11/2015   HDL 40 10/09/2014   Lab Results  Component Value Date   LDLCALC 109 10/11/2015   LDLCALC 113 (H) 10/09/2014   Lab Results  Component Value Date   TRIG 113 10/11/2015   TRIG 140 10/09/2014   Lab Results  Component Value Date   CHOLHDL 3.6 10/11/2015   No results found for: LDLDIRECT Glucose: diabetes runs in the family, really strong Glucose  Date  Value Ref Range Status  10/09/2014 87 65 - 99 mg/dL Final   Glucose, Bld  Date Value Ref Range Status  10/11/2015 85 65 - 99 mg/dL Final   Colorectal cancer: grandfather had colon cancer; start at age 62 Lung cancer:  n/a AAA: n/a Aspirin: n/a Diet: typical American diet Exercise: was exercising until May, but not able to exercise right now Skin cancer: nothing new or worrisome  Past Medical History:  Diagnosis Date  . Abnormal mammogram    breast biopsy, [place] 2014  . Dermatitis   . History of gestational diabetes   . History of kidney stones 2014  . Hypothyroidism   . IFG (impaired fasting glucose)   . Irregular periods   . Neuropathy   . Obesity   . Vitamin D deficiency disease    Past Surgical History:  Procedure Laterality Date  . BREAST BIOPSY  2014   PASH  . INTRAUTERINE DEVICE INSERTION  03/11/2012   Family History  Problem Relation Age of Onset  . Diabetes Mother   . Pancreatitis Mother   . Hypertension Mother   . Diabetes Sister        pre-diabetic  . Diabetes Brother   . ADD / ADHD Son   . Eczema Son   . Ovarian cancer Neg Hx   . Breast cancer Neg Hx    Social History  Substance Use Topics  .  Smoking status: Never Smoker  . Smokeless tobacco: Never Used  . Alcohol use No   Review of Systems  Objective:   Vitals:   10/16/16 0840  BP: 118/70  Pulse: 87  Resp: 14  Temp: 98.3 F (36.8 C)  TempSrc: Oral  SpO2: 98%  Weight: 234 lb 12.8 oz (106.5 kg)  Height: 5' 5.01" (1.651 m)   Body mass index is 39.06 kg/m. Wt Readings from Last 3 Encounters:  10/16/16 234 lb 12.8 oz (106.5 kg)  06/01/16 222 lb 8 oz (100.9 kg)  02/29/16 220 lb 3.2 oz (99.9 kg)   Physical Exam  Constitutional: She appears well-developed and well-nourished.  Obese with significant weight gain noted  HENT:  Head: Normocephalic and atraumatic.  Right Ear: Hearing, tympanic membrane, external ear and ear canal normal. No middle ear effusion.  Left Ear: Hearing,  tympanic membrane, external ear and ear canal normal.  No middle ear effusion.  Mouth/Throat: Oropharynx is clear and moist and mucous membranes are normal.  Eyes: Conjunctivae and EOM are normal. Right eye exhibits no discharge and no hordeolum. Left eye exhibits no discharge and no hordeolum. Right conjunctiva is not injected. Right conjunctiva has no hemorrhage. Left conjunctiva is not injected. Left conjunctiva has no hemorrhage. No scleral icterus. Right eye exhibits normal extraocular motion. Left eye exhibits normal extraocular motion.    Inner palpebral conjunctiva, 2 x 1 x 0.25 mm erythematous lesion; no drainage  Neck: No JVD present. No thyroid mass and no thyromegaly (mild uniform thyromegaly versus adipose tissue) present.  Symmetric tissue anterior neck, no nodules  Cardiovascular: Normal rate, regular rhythm, S1 normal, S2 normal and normal heart sounds.   No extrasystoles are present.  Pulmonary/Chest: Effort normal and breath sounds normal. No respiratory distress. Right breast exhibits no inverted nipple, no mass, no nipple discharge, no skin change and no tenderness. Left breast exhibits no inverted nipple, no mass, no nipple discharge, no skin change and no tenderness. Breasts are symmetrical.  Abdominal: Soft. Normal appearance and bowel sounds are normal. She exhibits no distension, no abdominal bruit, no pulsatile midline mass and no mass. There is no hepatosplenomegaly. There is no tenderness. No hernia.  Musculoskeletal: Normal range of motion. She exhibits no edema.  Lymphadenopathy:       Head (right side): No submandibular adenopathy present.       Head (left side): No submandibular adenopathy present.    She has no cervical adenopathy.    She has no axillary adenopathy.  Neurological: She is alert. She displays no tremor. She exhibits normal muscle tone. Gait normal.  Reflex Scores:      Patellar reflexes are 2+ on the right side and 2+ on the left side. Skin: Skin is  warm and dry. No bruising and no ecchymosis noted. No cyanosis. No pallor.  Changes around the nape of the neck, axilla consistent with acanthosis nigricans  Psychiatric: Her speech is normal and behavior is normal. Thought content normal. Her mood appears not anxious. She does not exhibit a depressed mood.    Assessment/Plan:   Problem List Items Addressed This Visit      Other   Weight gain    Discussed with patient; encouraged her to back into her efforts of healthier eating, taking 30 minutes a day for self-care, walking, relaxation, something positive      Vitamin D deficiency disease    Check vit D level, supplement if needed      Relevant Orders   VITAMIN D  25 Hydroxy (Vit-D Deficiency, Fractures)   Preventative health care - Primary    USPSTF grade A and B recommendations reviewed with patient; age-appropriate recommendations, preventive care, screening tests, etc discussed and encouraged; healthy living encouraged; see AVS for patient education given to patient      Relevant Orders   CBC with Differential/Platelet   COMPLETE METABOLIC PANEL WITH GFR   Lipid panel   TSH   Obesity (BMI 35.0-39.9 without comorbidity)    Work on weight loss       Other Visit Diagnoses    Hordeolum externum left lower eyelid       warm compresses over the affected eye a few times a day; call me if not better on Friday   Acanthosis nigricans       sign of insulin resistance;w eight loss encouraged       No orders of the defined types were placed in this encounter.  Orders Placed This Encounter  Procedures  . CBC with Differential/Platelet  . COMPLETE METABOLIC PANEL WITH GFR  . Lipid panel  . TSH  . VITAMIN D 25 Hydroxy (Vit-D Deficiency, Fractures)    Follow up plan: Return in about 1 year (around 10/16/2017) for complete physical.  An After Visit Summary was printed and given to the patient.

## 2016-10-16 NOTE — Assessment & Plan Note (Signed)
Work on weight loss.

## 2016-10-16 NOTE — Assessment & Plan Note (Signed)
Discussed with patient; encouraged her to back into her efforts of healthier eating, taking 30 minutes a day for self-care, walking, relaxation, something positive

## 2016-10-16 NOTE — Patient Instructions (Addendum)
Please call me Friday if your eye isn't better Check out the information at [email or web address] entitled "Nutrition for Weight Loss: What You Need to Know about Fad Diets" Try to lose between 1-2 pounds per week by taking in fewer calories and burning off more calories You can succeed by limiting portions, limiting foods dense in calories and fat, becoming more active, and drinking 8 glasses of water a day (64 ounces) Don't skip meals, especially breakfast, as skipping meals may alter your metabolism Do not use over-the-counter weight loss pills or gimmicks that claim rapid weight loss A healthy BMI (or body mass index) is between 18.5 and 24.9 You can calculate your ideal BMI at the [place] website [employer] Health Maintenance, Female Adopting a healthy lifestyle and getting preventive care can go a long way to promote health and wellness. Talk with your health care provider about what schedule of regular examinations is right for you. This is a good chance for you to check in with your provider about disease prevention and staying healthy. In between checkups, there are plenty of things you can do on your own. Experts have done a lot of research about which lifestyle changes and preventive measures are most likely to keep you healthy. Ask your health care provider for more information. Weight and diet Eat a healthy diet  Be sure to include plenty of vegetables, fruits, low-fat dairy products, and lean protein.  Do not eat a lot of foods high in solid fats, added sugars, or salt.  Get regular exercise. This is one of the most important things you can do for your health. ? Most adults should exercise for at least 150 minutes each week. The exercise should increase your heart rate and make you sweat (moderate-intensity exercise). ? Most adults should also do strengthening exercises at least twice a week. This is in addition to the  moderate-intensity exercise.  Maintain a healthy weight  Body mass index (BMI) is a measurement that can be used to identify possible weight problems. It estimates body fat based on height and weight. Your health care provider can help determine your BMI and help you achieve or maintain a healthy weight.  For females 37 years of age and older: ? A BMI below 18.5 is considered underweight. ? A BMI of 18.5 to 24.9 is normal. ? A BMI of 25 to 29.9 is considered overweight. ? A BMI of 30 and above is considered obese.  Watch levels of cholesterol and blood lipids  You should start having your blood tested for lipids and cholesterol at 39 years of age, then have this test every 5 years.  You may need to have your cholesterol levels checked more often if: ? Your lipid or cholesterol levels are high. ? You are older than 39 years of age. ? You are at high risk for heart disease.  Cancer screening Lung Cancer  Lung cancer screening is recommended for adults 73-56 years old who are at high risk for lung cancer because of a history of smoking.  A yearly low-dose CT scan of the lungs is recommended for people who: ? Currently smoke. ? Have quit within the past 15 years. ? Have at least a 30-pack-year history of smoking. A pack year is smoking an average of one pack of cigarettes a day for 1 year.  Yearly screening should continue until it has been 15 years since you quit.  Yearly screening should stop if you develop a health problem that would prevent you from  having lung cancer treatment.  Breast Cancer  Practice breast self-awareness. This means understanding how your breasts normally appear and feel.  It also means doing regular breast self-exams. Let your health care provider know about any changes, no matter how small.  If you are in your 20s or 30s, you should have a clinical breast exam (CBE) by a health care provider every 1-3 years as part of a regular health exam.  If you  are 104 or older, have a CBE every year. Also consider having a breast X-ray (mammogram) every year.  If you have a family history of breast cancer, talk to your health care provider about genetic screening.  If you are at high risk for breast cancer, talk to your health care provider about having an MRI and a mammogram every year.  Breast cancer gene (BRCA) assessment is recommended for women who have family members with BRCA-related cancers. BRCA-related cancers include: ? Breast. ? Ovarian. ? Tubal. ? Peritoneal cancers.  Results of the assessment will determine the need for genetic counseling and BRCA1 and BRCA2 testing.  Cervical Cancer Your health care provider may recommend that you be screened regularly for cancer of the pelvic organs (ovaries, uterus, and vagina). This screening involves a pelvic examination, including checking for microscopic changes to the surface of your cervix (Pap test). You may be encouraged to have this screening done every 3 years, beginning at age 74.  For women ages 3-65, health care providers may recommend pelvic exams and Pap testing every 3 years, or they may recommend the Pap and pelvic exam, combined with testing for human papilloma virus (HPV), every 5 years. Some types of HPV increase your risk of cervical cancer. Testing for HPV may also be done on women of any age with unclear Pap test results.  Other health care providers may not recommend any screening for nonpregnant women who are considered low risk for pelvic cancer and who do not have symptoms. Ask your health care provider if a screening pelvic exam is right for you.  If you have had past treatment for cervical cancer or a condition that could lead to cancer, you need Pap tests and screening for cancer for at least 20 years after your treatment. If Pap tests have been discontinued, your risk factors (such as having a new sexual partner) need to be reassessed to determine if screening should  resume. Some women have medical problems that increase the chance of getting cervical cancer. In these cases, your health care provider may recommend more frequent screening and Pap tests.  Colorectal Cancer  This type of cancer can be detected and often prevented.  Routine colorectal cancer screening usually begins at 39 years of age and continues through 39 years of age.  Your health care provider may recommend screening at an earlier age if you have risk factors for colon cancer.  Your health care provider may also recommend using home test kits to check for hidden blood in the stool.  A small camera at the end of a tube can be used to examine your colon directly (sigmoidoscopy or colonoscopy). This is done to check for the earliest forms of colorectal cancer.  Routine screening usually begins at age 86.  Direct examination of the colon should be repeated every 5-10 years through 39 years of age. However, you may need to be screened more often if early forms of precancerous polyps or small growths are found.  Skin Cancer  Check your skin from head  to toe regularly.  Tell your health care provider about any new moles or changes in moles, especially if there is a change in a mole's shape or color.  Also tell your health care provider if you have a mole that is larger than the size of a pencil eraser.  Always use sunscreen. Apply sunscreen liberally and repeatedly throughout the day.  Protect yourself by wearing long sleeves, pants, a wide-brimmed hat, and sunglasses whenever you are outside.  Heart disease, diabetes, and high blood pressure  High blood pressure causes heart disease and increases the risk of stroke. High blood pressure is more likely to develop in: ? People who have blood pressure in the high end of the normal range (130-139/85-89 mm Hg). ? People who are overweight or obese. ? People who are African American.  If you are 83-42 years of age, have your blood  pressure checked every 3-5 years. If you are 27 years of age or older, have your blood pressure checked every year. You should have your blood pressure measured twice-once when you are at a hospital or clinic, and once when you are not at a hospital or clinic. Record the average of the two measurements. To check your blood pressure when you are not at a hospital or clinic, you can use: ? An automated blood pressure machine at a pharmacy. ? A home blood pressure monitor.  If you are between 18 years and 79 years old, ask your health care provider if you should take aspirin to prevent strokes.  Have regular diabetes screenings. This involves taking a blood sample to check your fasting blood sugar level. ? If you are at a normal weight and have a low risk for diabetes, have this test once every three years after 39 years of age. ? If you are overweight and have a high risk for diabetes, consider being tested at a younger age or more often. Preventing infection Hepatitis B  If you have a higher risk for hepatitis B, you should be screened for this virus. You are considered at high risk for hepatitis B if: ? You were born in a country where hepatitis B is common. Ask your health care provider which countries are considered high risk. ? Your parents were born in a high-risk country, and you have not been immunized against hepatitis B (hepatitis B vaccine). ? You have HIV or AIDS. ? You use needles to inject street drugs. ? You live with someone who has hepatitis B. ? You have had sex with someone who has hepatitis B. ? You get hemodialysis treatment. ? You take certain medicines for conditions, including cancer, organ transplantation, and autoimmune conditions.  Hepatitis C  Blood testing is recommended for: ? Everyone born from [birth year] through 1963. ? Anyone with known risk factors for hepatitis C.  Sexually transmitted infections (STIs)  You should be screened for sexually transmitted  infections (STIs) including gonorrhea and chlamydia if: ? You are sexually active and are younger than 39 years of age. ? You are older than 39 years of age and your health care provider tells you that you are at risk for this type of infection. ? Your sexual activity has changed since you were last screened and you are at an increased risk for chlamydia or gonorrhea. Ask your health care provider if you are at risk.  If you do not have HIV, but are at risk, it may be recommended that you take a prescription medicine daily to prevent HIV  infection. This is called pre-exposure prophylaxis (PrEP). You are considered at risk if: ? You are sexually active and do not regularly use condoms or know the HIV status of your partner(s). ? You take drugs by injection. ? You are sexually active with a partner who has HIV.  Talk with your health care provider about whether you are at high risk of being infected with HIV. If you choose to begin PrEP, you should first be tested for HIV. You should then be tested every 3 months for as long as you are taking PrEP. Pregnancy  If you are premenopausal and you may become pregnant, ask your health care provider about preconception counseling.  If you may become pregnant, take 400 to 800 micrograms (mcg) of folic acid every day.  If you want to prevent pregnancy, talk to your health care provider about birth control (contraception). Osteoporosis and menopause  Osteoporosis is a disease in which the bones lose minerals and strength with aging. This can result in serious bone fractures. Your risk for osteoporosis can be identified using a bone density scan.  If you are 27 years of age or older, or if you are at risk for osteoporosis and fractures, ask your health care provider if you should be screened.  Ask your health care provider whether you should take a calcium or vitamin D supplement to lower your risk for osteoporosis.  Menopause may have certain physical  symptoms and risks.  Hormone replacement therapy may reduce some of these symptoms and risks. Talk to your health care provider about whether hormone replacement therapy is right for you. Follow these instructions at home:  Schedule regular health, dental, and eye exams.  Stay current with your immunizations.  Do not use any tobacco products including cigarettes, chewing tobacco, or electronic cigarettes.  If you are pregnant, do not drink alcohol.  If you are breastfeeding, limit how much and how often you drink alcohol.  Limit alcohol intake to no more than 1 drink per day for nonpregnant women. One drink equals 12 ounces of beer, 5 ounces of wine, or 1 ounces of hard liquor.  Do not use street drugs.  Do not share needles.  Ask your health care provider for help if you need support or information about quitting drugs.  Tell your health care provider if you often feel depressed.  Tell your health care provider if you have ever been abused or do not feel safe at home. This information is not intended to replace advice given to you by your health care provider. Make sure you discuss any questions you have with your health care provider. Document Released: 09/19/2010 Document Revised: 08/12/2015 Document Reviewed: 12/08/2014 Elsevier Interactive Patient Education  [employer].

## 2016-10-16 NOTE — Assessment & Plan Note (Signed)
Check vit D level, supplement if needed

## 2016-10-17 LAB — VITAMIN D 25 HYDROXY (VIT D DEFICIENCY, FRACTURES): VIT D 25 HYDROXY: 38 ng/mL (ref 30–100)

## 2016-12-06 ENCOUNTER — Encounter: Payer: Self-pay | Admitting: Obstetrics and Gynecology

## 2016-12-06 ENCOUNTER — Ambulatory Visit (INDEPENDENT_AMBULATORY_CARE_PROVIDER_SITE_OTHER): Payer: BC Managed Care – PPO | Admitting: Obstetrics and Gynecology

## 2016-12-06 VITALS — BP 117/75 | HR 79 | Ht 65.0 in | Wt 239.6 lb

## 2016-12-06 DIAGNOSIS — N939 Abnormal uterine and vaginal bleeding, unspecified: Secondary | ICD-10-CM

## 2016-12-06 DIAGNOSIS — R635 Abnormal weight gain: Secondary | ICD-10-CM

## 2016-12-06 DIAGNOSIS — Z6832 Body mass index (BMI) 32.0-32.9, adult: Secondary | ICD-10-CM

## 2016-12-06 DIAGNOSIS — E6609 Other obesity due to excess calories: Secondary | ICD-10-CM | POA: Diagnosis not present

## 2016-12-06 DIAGNOSIS — Z3044 Encounter for surveillance of vaginal ring hormonal contraceptive device: Secondary | ICD-10-CM

## 2016-12-06 DIAGNOSIS — F419 Anxiety disorder, unspecified: Secondary | ICD-10-CM

## 2016-12-06 NOTE — Patient Instructions (Addendum)
1. Continue using the NuvaRing monthly 2. Continue monitoring for any abnormal uterine bleeding 3. Continue taking citalopram daily 4. Schedule psychology counseling with [facility] at work in [place] 5. Return in 6 months 6. Continue to eat healthy and exercise with the goal of 6 pound weight loss over the next 6 months.

## 2016-12-06 NOTE — Progress Notes (Signed)
Chief complaint: 1. Abnormal uterine bleeding follow-up 2. Anxiety/depression follow-up 3. Weight management follow-up  Patient presents for 6 month follow-up. She is on NuvaRing for regulation of abnormal uterine bleeding. Menses are minimal at this time. She does understand that amenorrhea state is okay with the NuvaRing.  Patient states that the citalopram is helping with her anxiety symptomatology. However, she has been having increased stressors and depression . She actually has gained 17 pounds in the past 6 months. 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 She is not having any significant side effects from the citalopram.  OBJECTIVE: BP 117/75   Pulse 79   Ht  (1.651 m)   Wt 239 lb 9.6 oz (108.7 kg)   BMI 39.87 kg/m  Affect normal  ASSESSMENT: 1. Abnormal uterine bleeding, resolved with NuvaRing therapy 2. Anxiety/depression, ongoing with reasonable response to citalopram. Patient understands the counseling will optimize her management of this condition 3. Weight gain due to multiple stressors and decreased exercise and increased appetite  PLAN: 1. Continue using NuvaRing monthly 2. Continue monitoring for abnormal uterine bleeding with menstrual calendar 3. Continue citalopram therapy 4. Schedule [employer] counseling session at work 5. Management strategies for losing weight were discussed including dietary modification, regular exercise. Her goal is 1 pound weight loss per month over the next 6 months. 6. Return in 6 months for follow-up  A total of 15 minutes were spent face-to-face with the patient during this encounter and over half of that time dealt with counseling and coordination of care.  Herold Harms, MD  Note: This dictation was prepared with Dragon dictation along with smaller phrase technology. Any transcriptional errors that result from this process are  unintentional.   .

## 2016-12-11 ENCOUNTER — Other Ambulatory Visit: Payer: Self-pay | Admitting: Obstetrics and Gynecology

## 2017-01-04 ENCOUNTER — Other Ambulatory Visit: Payer: Self-pay

## 2017-01-05 ENCOUNTER — Other Ambulatory Visit: Payer: Self-pay

## 2017-01-05 MED ORDER — CITALOPRAM HYDROBROMIDE 20 MG PO TABS: 20.0000 mg | ORAL_TABLET | Freq: Every day | ORAL | 1 refills | Status: DC

## 2017-01-05 NOTE — Telephone Encounter (Signed)
Too soon for thyroid medicine

## 2017-01-08 ENCOUNTER — Other Ambulatory Visit: Payer: Self-pay

## 2017-01-08 MED ORDER — LEVOTHYROXINE SODIUM 50 MCG PO TABS: 50.0000 ug | ORAL_TABLET | Freq: Every day | ORAL | 2 refills | Status: DC

## 2017-01-08 NOTE — Telephone Encounter (Signed)
Lab Results  Component Value Date   TSH 2.48 10/16/2016   Okay for 90 day supply

## 2017-01-08 NOTE — Telephone Encounter (Signed)
Requesting 90 day supply.

## 2017-03-21 ENCOUNTER — Encounter: Payer: Self-pay | Admitting: Family Medicine

## 2017-03-21 ENCOUNTER — Ambulatory Visit: Payer: BC Managed Care – PPO | Admitting: Family Medicine

## 2017-03-21 VITALS — BP 138/84 | HR 76 | Temp 98.7°F | Resp 16 | Wt 243.5 lb

## 2017-03-21 DIAGNOSIS — R070 Pain in throat: Secondary | ICD-10-CM

## 2017-03-21 DIAGNOSIS — R1013 Epigastric pain: Secondary | ICD-10-CM

## 2017-03-21 DIAGNOSIS — R05 Cough: Secondary | ICD-10-CM

## 2017-03-21 DIAGNOSIS — E039 Hypothyroidism, unspecified: Secondary | ICD-10-CM | POA: Diagnosis not present

## 2017-03-21 DIAGNOSIS — R059 Cough, unspecified: Secondary | ICD-10-CM

## 2017-03-21 MED ORDER — BENZONATATE 100 MG PO CAPS: 100.0000 mg | ORAL_CAPSULE | Freq: Three times a day (TID) | ORAL | 0 refills | Status: DC | PRN

## 2017-03-21 MED ORDER — RANITIDINE HCL 150 MG PO CAPS: 150.0000 mg | ORAL_CAPSULE | Freq: Two times a day (BID) | ORAL | 0 refills | Status: DC

## 2017-03-21 NOTE — Patient Instructions (Addendum)
Please take Ranitidine (Zantac) twice daily.  Please avoid foods listed below to help with your heartburn. Please take 53m Benadryl (Diphenhydramine) and night and Claritin (Loratidine) during the day to help with your throat discomfort. We will check your CBC and Thyroid today with your lab work. Follow up in 1 week. Food Choices for Gastroesophageal Reflux Disease, Adult When you have gastroesophageal reflux disease (GERD), the foods you eat and your eating habits are very important. Choosing the right foods can help ease your discomfort. What guidelines do I need to follow?  Choose fruits, vegetables, whole grains, and low-fat dairy products.  Choose low-fat meat, fish, and poultry.  Limit fats such as oils, salad dressings, butter, nuts, and avocado.  Keep a food diary. This helps you identify foods that cause symptoms.  Avoid foods that cause symptoms. These may be different for everyone.  Eat small meals often instead of 3 large meals a day.  Eat your meals slowly, in a place where you are relaxed.  Limit fried foods.  Cook foods using methods other than frying.  Avoid drinking alcohol.  Avoid drinking large amounts of liquids with your meals.  Avoid bending over or lying down until 2-3 hours after eating. What foods are not recommended? These are some foods and drinks that may make your symptoms worse: Vegetables Tomatoes. Tomato juice. Tomato and spaghetti sauce. Chili peppers. Onion and garlic. Horseradish. Fruits Oranges, grapefruit, and lemon (fruit and juice). Meats High-fat meats, fish, and poultry. This includes hot dogs, ribs, ham, sausage, salami, and bacon. Dairy Whole milk and chocolate milk. Sour cream. Cream. Butter. Ice cream. Cream cheese. Drinks Coffee and tea. Bubbly (carbonated) drinks or energy drinks. Condiments Hot sauce. Barbecue sauce. Sweets/Desserts Chocolate and cocoa. Donuts. Peppermint and spearmint. Fats and Oils High-fat foods.  This includes [place] and potato chips. Other Vinegar. Strong spices. This includes black pepper, white pepper, red pepper, cayenne, curry powder, cloves, ginger, and chili powder. The items listed above may not be a complete list of foods and drinks to avoid. Contact your dietitian for more information. This information is not intended to replace advice given to you by your health care provider. Make sure you discuss any questions you have with your health care provider. Document Released: 09/05/2011 Document Revised: 08/12/2015 Document Reviewed: 01/08/2013 Elsevier Interactive Patient Education  2017 [place]  Anaphylactic Reaction An anaphylactic reaction (anaphylaxis) is a sudden allergic reaction that is very bad (severe). It also affects more than one part of the body. This condition can be life-threatening. If you have an anaphylactic reaction, you need to get medical help right away. You may need to stay in the hospital. Your doctor may teach you how to use an allergy kit (anaphylaxis kit) and how to give yourself an allergy shot (epinephrine injection). You can give yourself an allergy shot with what is commonly called an auto-injector "pen." Symptoms of an anaphylactic reaction may include:  A stuffy nose (nasal congestion).  Headache.  Tingling in your mouth.  A flushed face.  An itchy, red rash.  Swelling of your eyes, lips, face, or tongue.  Swelling of the back of your mouth and your throat.  Breathing loudly (wheezing).  A hoarse voice.  Itchy, red, swollen areas of skin (hives).  Dizziness or light-headedness.  Passing out (fainting).  Feeling worried or nervous (anxiety).  Feeling confused.  Pain in your belly (abdomen) or chest.  Trouble with breathing, talking, or swallowing.  A tight feeling in your chest or  throat.  Fast or uneven heartbeats (palpitations).  Throwing up (vomiting).  Watery poop (diarrhea).  Follow these  instructions at home: Safety  Always keep an auto-injector pen or your allergy kit with you. These could save your life. Use them as told by your doctor.  Do not drive until your doctor says that it is safe.  Make sure that you, the people who live with you, and your employer know: ? How to use your allergy kit. ? How to use an auto-injector pen to give you an allergy shot.  If you used your auto-injector pen: ? Get more medicine for it right away. This is important in case you have another reaction. ? Get help right away.  Wear a bracelet or necklace that says you have an allergy, if your doctor tells you to do this.  Learn the signs of a very bad allergic reaction.  Work with your doctors to make a plan for what to do if you have a very bad allergic reaction. Being prepared is important. General instructions  Take over-the-counter and prescription medicines only as told by your doctor.  If you have itchy, red, swollen areas of skin or a rash: ? Use over-the-counter medicine (antihistamine) as told by your doctor. ? Put cold, wet cloths (cold compresses) on your skin. ? Take baths or showers in cool water. Avoid hot water.  If you had tests done, it is up to you to get your test results. Ask your doctor when your results will be ready.  Tell any doctors who care for you that you have an allergy.  Keep all follow-up visits as told by your doctor. This is important. How is this prevented?  Avoid things (allergens) that gave you a very bad allergic reaction before.  If you have a food allergy and you go to a restaurant, tell your server about your allergy. If you are not sure if your meal was made with food that you are allergic to, ask your server before you eat it. Contact a doctor if:  You have symptoms of an allergic reaction. You may notice them soon after being around whatever it is that you are allergic to. Symptoms may include: ? A rash. ? A headache. ? Sneezing or a  runny nose. ? Swelling. ? Feeling sick to your stomach. ? Watery poop. Get help right away if:  You had to use your auto-injector pen. You must go to the emergency room even if the medicine seems to be working.  You have any of these: ? A tight feeling in your chest or your throat. ? Loud breathing. ? Trouble with breathing. ? Itchy, red, swollen areas of skin. ? Red skin or itching all over your body. ? Swelling in your lips, tongue, or the back of your throat.  You have throwing up that gets very bad.  You have watery poop that gets very bad.  You pass out or feel like you might pass out. These symptoms may be an emergency. Do not wait to see if the symptoms will go away. Use your auto-injector pen or allergy kit as you have been told. Get medical help right away. Call your local emergency services (911 in the [place] Do not drive yourself to the hospital. Summary  An anaphylactic reaction (anaphylaxis) is a sudden allergic reaction that is very bad (severe).  This condition can be life-threatening. If you have an anaphylactic reaction, you need to get medical help right away.  Your doctor may  teach you how to use an allergy kit (anaphylaxis kit) and how to give yourself an allergy shot (epinephrine injection) with an auto-injector "pen."  Always keep an auto-injector pen or your allergy kit with you. These could save your life. Use them as told by your doctor.  If you had to use your auto-injector pen, you must go to the emergency room even if the medicine seems to be working. This information is not intended to replace advice given to you by your health care provider. Make sure you discuss any questions you have with your health care provider. Document Released: 08/23/2007 Document Revised: 10/29/2015 Document Reviewed: 10/29/2015 Elsevier Interactive Patient Education  2017 [employer].

## 2017-03-21 NOTE — Progress Notes (Signed)
Name: Kathryn Munoz   MRN: [id number]    DOB: 05/15/1975   Date:03/21/2017       Progress Note  Subjective  Chief Complaint  Chief Complaint  Patient presents with  . Oral Swelling    could be several things.  has hx of tsh issues on med.  also could be allergic reaction to a nutella like spread pation ate.  Also has had acid reflux.  Patient states has felt burning sensatin, some difficulty swallowing, swelling also cough at night and hoarsness.   tes HPI  6 days ago she ate something she bought at a bazaar (unsure of what was in this food - possibly had cocoa powder and dates; no nuts in the food), felt like she was having difficulty swallowing and possibly swelling in her throat - took Benadryl which helped her get through the night.  The next day (Day 2) she still had a sore throat - drank hot tea and a cough drop.  On day 3 she noted acid reflux and cough; she took some tums and drank some milk and this helped a little bit.  Still having itching/scratchy throat.  Today she woke up with a mild headache and some neck soreness - this has since gone away. Has hypothyroidism.  Denies difficulty swallowing after first day.  Most concerning symptom currently is cough.  Denies chest pain or shortness of breath, fevers/chills, body aches, nasal congestion.  She does endorse mild epigastric pain.  Patient Active Problem List   Diagnosis Date Noted  . Anxiety 06/01/2016  . Weight gain 06/01/2016  . Cold intolerance 06/01/2016  . Preventative health care 10/11/2015  . Peripheral neuropathy 10/09/2014  . Hypothyroidism   . IFG (impaired fasting glucose)   . Vitamin D deficiency disease   . Obesity (BMI 35.0-39.9 without comorbidity)   . Irregular periods   . Dermatitis   . Neuropathy     Social History   Tobacco Use  . Smoking status: Never Smoker  . Smokeless tobacco: Never Used  Substance Use Topics  . Alcohol use: No     Current Outpatient Medications:  .  citalopram (CELEXA) 20  MG tablet, Take 1 tablet (20 mg total) by mouth daily., Disp: 90 tablet, Rfl: 1 .  levothyroxine (SYNTHROID, LEVOTHROID) 50 MCG tablet, Take 1 tablet (50 mcg total) by mouth daily before breakfast., Disp: 90 tablet, Rfl: 2 .  Multiple Vitamins-Minerals (MULTIVITAMIN WITH MINERALS) tablet, Take 1 tablet by mouth daily., Disp: , Rfl:  .  NUVARING 0.12-0.015 MG/24HR vaginal ring, INSERT VAGINALLY AND LEAVE IN PLACE FOR 3 CONSECUTIVE WEEKS, THEN REMOVE FOR 1 WEEK., Disp: 1 each, Rfl: 12 .  polyethylene glycol (MIRALAX / GLYCOLAX) packet, Take 17 g by mouth daily., Disp: , Rfl:  .  Cholecalciferol (VITAMIN D3) 1000 units CAPS, Take by mouth., Disp: , Rfl:   No Known Allergies  ROS  Ten systems reviewed and is negative except as mentioned in HPI  Objective  Vitals:   03/21/17 1507  BP: 138/84  Pulse: 76  Resp: 16  Temp: 98.7 F (37.1 C)  TempSrc: Oral  SpO2: 99%  Weight: 243 lb 8 oz (110.5 kg)   Body mass index is 40.52 kg/m.  Nursing Note and Vital Signs reviewed.  Physical Exam  Constitutional: Patient appears well-developed and well-nourished. Obese No distress.  HEENT: head atraumatic, normocephalic, PERRLA, no maxillary or frontal sinus pain on palpation, oropharynx pink and moist without exudate NECK: Full AROM without stiffness, neck is supply  without lymphadenopathy. No appreciable thyromegaly, unable to elicit tenderness on palpation. Cardiovascular: Normal rate, regular rhythm, S1/S2 present.  No murmur or rub heard. No BLE edema. Pulmonary/Chest: Effort normal and breath sounds clear. No respiratory distress or retractions. Abdominal: Soft and mild tenderness over epigastric area, bowel sounds present x4 quadrants. Psychiatric: Patient has a normal mood and affect. behavior is normal. Judgment and thought content normal.  No results found for this or any previous visit (from the past 2160 hour(s)).   Assessment & Plan  1. Throat discomfort - CBC w/Diff/Platelet -  Thyroid Panel With TSH - Unclear if this was in fact allergic reaction at this point.  Zantac BID for reflux and allergic reaction treatment. Pt to take OTC Benadryl at night and Claritin during the day. - If unimproved in 7 days, we will refer to either/or ENT/Allergist for further evaluation. 2. Hypothyroidism, unspecified type - Thyroid Panel With TSH  3. Epigastric pain - ranitidine (ZANTAC) 150 MG capsule; Take 1 capsule (150 mg total) by mouth 2 (two) times daily.  Dispense: 30 capsule; Refill: 0 - Avoid triggers, GERD diet discussed  4. Cough - benzonatate (TESSALON PERLES) 100 MG capsule; Take 1-2 capsules (100-200 mg total) by mouth 3 (three) times daily as needed.  Dispense: 20 capsule; Refill: 0 - Humidifier - Cool Mist at night  -Red flags and when to present for emergency care or RTC including fever >101.84F, chest pain, shortness of breath, new/worsening/un-resolving symptoms, throat/tongue/lip swelling, reviewed with patient at time of visit. Follow up and care instructions discussed and provided in AVS.

## 2017-03-22 LAB — THYROID PANEL WITH TSH
Free Thyroxine Index: 1.8 (ref 1.4–3.8)
T3 Uptake: 16 % — ABNORMAL LOW (ref 22–35)
T4 TOTAL: 11.1 ug/dL (ref 5.1–11.9)
TSH: 3.89 m[IU]/L

## 2017-03-22 LAB — CBC WITH DIFFERENTIAL/PLATELET
BASOS PCT: 0.7 %
Basophils Absolute: 64 cells/uL (ref 0–200)
EOS PCT: 2.9 %
Eosinophils Absolute: 264 cells/uL (ref 15–500)
HEMATOCRIT: 38.1 % (ref 35.0–45.0)
HEMOGLOBIN: 12.5 g/dL (ref 11.7–15.5)
LYMPHS ABS: 3012 {cells}/uL (ref 850–3900)
MCH: 30.2 pg (ref 27.0–33.0)
MCHC: 32.8 g/dL (ref 32.0–36.0)
MCV: 92 fL (ref 80.0–100.0)
MPV: 9.1 fL (ref 7.5–12.5)
Monocytes Relative: 4.3 %
NEUTROS ABS: 5369 {cells}/uL (ref 1500–7800)
NEUTROS PCT: 59 %
Platelets: 352 10*3/uL (ref 140–400)
RBC: 4.14 10*6/uL (ref 3.80–5.10)
RDW: 10.9 % — ABNORMAL LOW (ref 11.0–15.0)
Total Lymphocyte: 33.1 %
WBC: 9.1 10*3/uL (ref 3.8–10.8)
WBCMIX: 391 {cells}/uL (ref 200–950)

## 2017-03-29 ENCOUNTER — Ambulatory Visit: Payer: BC Managed Care – PPO | Admitting: Family Medicine

## 2017-03-29 ENCOUNTER — Encounter: Payer: Self-pay | Admitting: Family Medicine

## 2017-03-29 VITALS — BP 136/78 | HR 80 | Temp 99.3°F | Ht 65.0 in | Wt 243.8 lb

## 2017-03-29 DIAGNOSIS — T7840XD Allergy, unspecified, subsequent encounter: Secondary | ICD-10-CM

## 2017-03-29 DIAGNOSIS — K219 Gastro-esophageal reflux disease without esophagitis: Secondary | ICD-10-CM

## 2017-03-29 MED ORDER — EPINEPHRINE 0.3 MG/0.3ML IJ SOAJ: 0.3000 mg | Freq: Once | INTRAMUSCULAR | 1 refills | Status: AC

## 2017-03-29 MED ORDER — OMEPRAZOLE 20 MG PO CPDR: 20.0000 mg | DELAYED_RELEASE_CAPSULE | Freq: Every day | ORAL | 0 refills | Status: DC

## 2017-03-29 NOTE — Progress Notes (Signed)
BP 136/78 (BP Location: Right Arm, Patient Position: Sitting, Cuff Size: Large)   Pulse 80   Temp 99.3 F (37.4 C) (Oral)   Ht 5\' 5"  (1.651 m)   Wt 243 lb 12.8 oz (110.6 kg)   SpO2 99%   BMI 40.57 kg/m    Subjective:    Patient ID: Kathryn Munoz, female    DOB: 26-Nov-1975, 42 y.o.   MRN: [id number]  HPI: IVALENE Munoz is a 42 y.o. female  Chief Complaint  Patient presents with  . Follow-up    1wk f/u on illness    HPI  Patient is here for f/u; saw a colleague here; note reviewed She had jerk chicken for dinner that Friday She had brought some nutella, fruit spread, homemade fruit spread from a holiday craft market Her son is allergic to nuts; she had a little taste at the market first, then two tablespoons and then right after had the sensation of the swelling in the throat; no tingling of the lips; felt like swelling It had dates and cocoa powder, but specifically no nuts  Both sides of the face and neck felt swollen Never had any pain Symptoms started on Friday; came to office on Wednesday Son allergic to nuts, had allergy testing; nephew with allergy testing done Patient stays away form nuts because she had a mild reaction a long time again Avoiding tree nuts now Reflux has worsened during this episode  Depression screen [facility] 2/9 03/29/2017 03/21/2017 12/06/2016 10/16/2016 10/11/2015  Decreased Interest 0 0 2 0 0  Down, Depressed, Hopeless 0 1 1 1  0  PHQ - 2 Score 0 1 3 1  0  Altered sleeping - - 1 - -  Tired, decreased energy - - 2 - -  Change in appetite - - 3 - -  Feeling bad or failure about yourself  - - 2 - -  Trouble concentrating - - 1 - -  Moving slowly or fidgety/restless - - 1 - -  Suicidal thoughts - - 0 - -  PHQ-9 Score - - 13 - -    Relevant past medical, surgical, family and social history reviewed Past Medical History:  Diagnosis Date  . Abnormal mammogram    breast biopsy, [facility] 2014  . Dermatitis   . History of gestational diabetes   .  History of kidney stones 2014  . Hypothyroidism   . IFG (impaired fasting glucose)   . Irregular periods   . Neuropathy   . Obesity   . Vitamin D deficiency disease    Past Surgical History:  Procedure Laterality Date  . BREAST BIOPSY  2014   [facility]  . INTRAUTERINE DEVICE INSERTION  03/11/2012   Family History  Problem Relation Age of Onset  . Diabetes Mother   . Pancreatitis Mother   . Hypertension Mother   . Diabetes Sister        pre-diabetic  . Diabetes Brother   . ADD / ADHD Son   . Eczema Son   . Ovarian cancer Neg Hx   . Breast cancer Neg Hx    Social History   Tobacco Use  . Smoking status: Never Smoker  . Smokeless tobacco: Never Used  Substance Use Topics  . Alcohol use: No  . Drug use: No   Interim medical history since last visit reviewed. Allergies and medications reviewed  Review of Systems Per HPI unless specifically indicated above     Objective:    BP 136/78 (BP Location:  Right Arm, Patient Position: Sitting, Cuff Size: Large)   Pulse 80   Temp 99.3 F (37.4 C) (Oral)   Ht 5\' 5"  (1.651 m)   Wt 243 lb 12.8 oz (110.6 kg)   SpO2 99%   BMI 40.57 kg/m   Wt Readings from Last 3 Encounters:  03/29/17 243 lb 12.8 oz (110.6 kg)  03/21/17 243 lb 8 oz (110.5 kg)  12/06/16 239 lb 9.6 oz (108.7 kg)    Physical Exam  Constitutional: She appears well-developed and well-nourished.  Morbidly obese  HENT:  Mouth/Throat: Oropharynx is clear and moist and mucous membranes are normal.  No swelling of lips or tongue  Eyes: EOM are normal. No scleral icterus.  Cardiovascular: Normal rate and regular rhythm.  Pulmonary/Chest: Effort normal and breath sounds normal.  Skin: No rash noted. No pallor.  Psychiatric: She has a normal mood and affect. Her behavior is normal.    Results for orders placed or performed in visit on 03/21/17  CBC w/Diff/Platelet  Result Value Ref Range   WBC 9.1 3.8 - 10.8 Thousand/uL   RBC 4.14 3.80 - 5.10 Million/uL    Hemoglobin 12.5 11.7 - 15.5 g/dL   HCT 21.3 08.6 - 57.8 %   MCV 92.0 80.0 - 100.0 fL   MCH 30.2 27.0 - 33.0 pg   MCHC 32.8 32.0 - 36.0 g/dL   RDW 46.9 (L) 62.9 - 52.8 %   Platelets 352 140 - 400 Thousand/uL   MPV 9.1 7.5 - 12.5 fL   Neutro Abs 5,369 1,500 - 7,800 cells/uL   Lymphs Abs 3,012 850 - 3,900 cells/uL   WBC mixed population 391 200 - 950 cells/uL   Eosinophils Absolute 264 15 - 500 cells/uL   Basophils Absolute 64 0 - 200 cells/uL   Neutrophils Relative % 59 %   Total Lymphocyte 33.1 %   Monocytes Relative 4.3 %   Eosinophils Relative 2.9 %   Basophils Relative 0.7 %  Thyroid Panel With TSH  Result Value Ref Range   T3 Uptake 16 (L) 22 - 35 %   T4, Total 11.1 5.1 - 11.9 mcg/dL   Free Thyroxine Index 1.8 1.4 - 3.8   TSH 3.89 mIU/L      Assessment & Plan:   Problem List Items Addressed This Visit      Digestive   Acid reflux    I think the acute benefit of the H2 blockade by the ranitidine is over; will start PPI short-term, but decrease SSRI dose by half while taking it      Relevant Medications   omeprazole (PRILOSEC) 20 MG capsule    Other Visit Diagnoses    Allergic reaction, subsequent encounter    -  Primary   Rx written for Epipen to use for life-threatening emergency only; keep with her; benadryl 50 mg PO if reaction; refer to allergist; avoid triggers   Relevant Orders   Ambulatory referral to Allergy       Follow up plan: No Follow-up on file.  An after-visit summary was printed and given to the patient at check-out.  Please see the patient instructions which may contain other information and recommendations beyond what is mentioned above in the assessment and plan.  Meds ordered this encounter  Medications  . EPINEPHrine (EPIPEN 2-PAK) 0.3 mg/0.3 mL IJ SOAJ injection    Sig: Inject 0.3 mLs (0.3 mg total) into the muscle once for 1 dose. For life-threatening allergic reaction / anaphylaxis    Dispense:  1  Device    Refill:  1  . omeprazole  (PRILOSEC) 20 MG capsule    Sig: Take 1 capsule (20 mg total) by mouth daily.    Dispense:  30 capsule    Refill:  0    Orders Placed This Encounter  Procedures  . Ambulatory referral to Allergy

## 2017-03-29 NOTE — Patient Instructions (Addendum)
We'll have you see the allergist Stay away from any suspected triggers Carry the Epi-pen with you, as some benadryl 50 mg (two of the 25 mg right away) While you are taking the omeprazole, just take HALF of your usual dose of the citalopram Just use the omeprazole for the shortest time frame and go back to ranitidine when able

## 2017-04-01 DIAGNOSIS — K219 Gastro-esophageal reflux disease without esophagitis: Secondary | ICD-10-CM | POA: Insufficient documentation

## 2017-04-01 NOTE — Assessment & Plan Note (Signed)
I think the acute benefit of the H2 blockade by the ranitidine is over; will start PPI short-term, but decrease SSRI dose by half while taking it

## 2017-06-05 ENCOUNTER — Ambulatory Visit: Payer: BC Managed Care – PPO | Admitting: Obstetrics and Gynecology

## 2017-06-05 ENCOUNTER — Encounter: Payer: Self-pay | Admitting: Obstetrics and Gynecology

## 2017-06-05 VITALS — BP 110/72 | HR 73 | Ht 65.0 in | Wt 249.0 lb

## 2017-06-05 DIAGNOSIS — L292 Pruritus vulvae: Secondary | ICD-10-CM | POA: Diagnosis not present

## 2017-06-05 DIAGNOSIS — E669 Obesity, unspecified: Secondary | ICD-10-CM

## 2017-06-05 DIAGNOSIS — N926 Irregular menstruation, unspecified: Secondary | ICD-10-CM

## 2017-06-05 DIAGNOSIS — F419 Anxiety disorder, unspecified: Secondary | ICD-10-CM | POA: Diagnosis not present

## 2017-06-05 MED ORDER — NYSTATIN-TRIAMCINOLONE 100000-0.1 UNIT/GM-% EX CREA: 1.0000 "application " | TOPICAL_CREAM | Freq: Two times a day (BID) | CUTANEOUS | 1 refills | Status: DC

## 2017-06-05 NOTE — Progress Notes (Signed)
Chief complaint: 1.  Abnormal uterine bleeding 2.  Anxiety/depression 3.  Obesity  Patient presents for follow-up since 6 months ago.  She continues on citalopram for anxiety/depression.  She continues with her NuvaRing for cycle regulation. Patient has not done well from a weight management standpoint.  She recognizes that she is not eating healthy while she is on the road with work.  She also states that her anxiety/depression symptoms which have waxed and waned may have contributed to decreased activity and abnormal eating.  Patient is having some vulvar itching at this time.  She feels like it is similar to a "jock itch".  Review of blood work is notable for normal thyroid panel and normal fasting blood sugars.  OBJECTIVE: BP 110/72   Pulse 73   Ht 5\' 5"  (1.651 m)   Wt 249 lb (112.9 kg)   LMP  (LMP Unknown)   BMI 41.44 kg/m  Pleasant elderly female in no acute distress.  Alert and oriented. Physical exam-deferred today  ASSESSMENT: 1.  Abnormal uterine bleeding, resolved with NuvaRing use.  Patient is amenorrheic 2.  Anxiety/depression symptoms, controlled on citalopram 20 mg daily.  Patient did not proceed with employee assistance program counseling as recommended 3.  Weight management-worse with 10 pound weight gain over the past 6 months.  Patient recognizes issues contributing to increased weight gain including poor dietary habits while traveling with work and decreased exercise.  PLAN: 1.  Continue with NuvaRing for cycle regulation 2.  Continue with citalopram 20 mg a day for anxiety/depression.  Consider EAP counseling if complex worsens 3.  Strongly encourage healthy eating with exercise; consider weight watchers. 4.  Return in 6 months for follow-up 5.  Continue with routine wellness exams with Dr. Sherie DonLada  A total of 15 minutes were spent face-to-face with the patient during this encounter and over half of that time dealt with counseling and coordination of  care.  Herold HarmsMartin A Dominika Losey, MD  Note: This dictation was prepared with Dragon dictation along with smaller phrase technology. Any transcriptional errors that result from this process are unintentional.

## 2017-06-05 NOTE — Patient Instructions (Addendum)
1.  Continue using citalopram 20 mg a day 2.  Continue using NuvaRing for cycle regulation 3.  Maintain menstrual calendar monitoring for any abnormal uterine bleeding 4.  Recommend continued exercise as noted.  Recommend healthy dieting with 3 meals a day avoiding carbohydrates.  Consider weight watchers 5.  Consider counseling if anxiety/depression gets worse 6.  Return in 6 months for follow-up 7.  Nystatin/triamcinolone cream is prescribed twice daily for 14 days to treat vulvar yeast infection.  If not improved in 2 weeks, return for follow-up

## 2017-07-04 ENCOUNTER — Other Ambulatory Visit: Payer: Self-pay | Admitting: Obstetrics and Gynecology

## 2017-10-03 ENCOUNTER — Other Ambulatory Visit: Payer: Self-pay | Admitting: Family Medicine

## 2017-10-04 NOTE — Telephone Encounter (Signed)
Lab Results  Component Value Date   TSH 3.89 03/21/2017   T4TOTAL 11.1 03/21/2017

## 2017-10-22 ENCOUNTER — Other Ambulatory Visit (HOSPITAL_COMMUNITY)
Admission: RE | Admit: 2017-10-22 | Discharge: 2017-10-22 | Disposition: A | Discharge status: Home / Self Care | Payer: BC Managed Care – PPO | Source: Ambulatory Visit | Attending: Family Medicine | Admitting: Family Medicine

## 2017-10-22 ENCOUNTER — Encounter: Payer: Self-pay | Admitting: Family Medicine

## 2017-10-22 ENCOUNTER — Ambulatory Visit (INDEPENDENT_AMBULATORY_CARE_PROVIDER_SITE_OTHER): Payer: BC Managed Care – PPO | Admitting: Family Medicine

## 2017-10-22 VITALS — BP 134/82 | HR 100 | Temp 99.0°F | Resp 12 | Ht 65.0 in | Wt 251.5 lb

## 2017-10-22 DIAGNOSIS — R635 Abnormal weight gain: Secondary | ICD-10-CM

## 2017-10-22 DIAGNOSIS — Z0000 Encounter for general adult medical examination without abnormal findings: Secondary | ICD-10-CM | POA: Insufficient documentation

## 2017-10-22 DIAGNOSIS — Z124 Encounter for screening for malignant neoplasm of cervix: Secondary | ICD-10-CM

## 2017-10-22 DIAGNOSIS — F331 Major depressive disorder, recurrent, moderate: Secondary | ICD-10-CM | POA: Diagnosis not present

## 2017-10-22 DIAGNOSIS — B353 Tinea pedis: Secondary | ICD-10-CM

## 2017-10-22 DIAGNOSIS — L83 Acanthosis nigricans: Secondary | ICD-10-CM

## 2017-10-22 DIAGNOSIS — R7301 Impaired fasting glucose: Secondary | ICD-10-CM

## 2017-10-22 DIAGNOSIS — N926 Irregular menstruation, unspecified: Secondary | ICD-10-CM

## 2017-10-22 DIAGNOSIS — N889 Noninflammatory disorder of cervix uteri, unspecified: Secondary | ICD-10-CM

## 2017-10-22 DIAGNOSIS — E039 Hypothyroidism, unspecified: Secondary | ICD-10-CM

## 2017-10-22 DIAGNOSIS — Z1239 Encounter for other screening for malignant neoplasm of breast: Secondary | ICD-10-CM

## 2017-10-22 DIAGNOSIS — K219 Gastro-esophageal reflux disease without esophagitis: Secondary | ICD-10-CM

## 2017-10-22 MED ORDER — CITALOPRAM HYDROBROMIDE 40 MG PO TABS: 40.0000 mg | ORAL_TABLET | Freq: Every day | ORAL | 3 refills | Status: DC

## 2017-10-22 NOTE — Assessment & Plan Note (Signed)
Check A1c; work on weight loss to reduce risk of type 2 DM

## 2017-10-22 NOTE — Assessment & Plan Note (Signed)
Explained risk of diabetes; sign of insulin resistance; most important thing she can do to lessen chance of developing diabetes is to lose weight; avoid sugary drinks

## 2017-10-22 NOTE — Assessment & Plan Note (Signed)
Increase SSRI; list of resources given; she agrees to get help if considering hurting herself; refer to psychiatrist; keep appt with counselor tomorrow; close f/u

## 2017-10-22 NOTE — Assessment & Plan Note (Signed)
Avoid omeprazole on the citalopram

## 2017-10-22 NOTE — Patient Instructions (Addendum)
Increase the citalopram from 20 mg daily to 40 mg daily We'll get you to see the psychiatrist Keep the appointment with the psychologist Use the Nuvaring exactly as prescribed by the gynecologist We'll get labs today If you have not heard anything from my staff in a week about any orders/referrals/studies from today, please contact us here to follow-up [phone] Use tolnaftate on the soles of the feet, daily or twice a day up to one month Here are some resources to help you if you feel you are in a mental health crisis:  [place] - Call [phone]  for help - Website with more resources: GripTrip.com.pt  [place] - Call [phone])468-4612 for help. - Mobile Crisis Program available 24 hours a day, 365 days a year. - Available for anyone of any age in [place] counties.  [facility] - Address: [address], [place] - Telephone: [phone]  - Hours of Operation: Sunday - Saturday - 8:00 a.m. - 8:00 p.m. - Medicaid, Medicare (Government Issued Only), BCBS, and [place] [facility], [place], Psychiatrists on-site to provide medication management, [place], and Peer Support Care.  [facility] - Call [phone] for help. - Mobile Crisis Program available 24 hours a day, 365 days a year. - Available for anyone of any age in [place] Maintenance, Female Adopting a healthy lifestyle and getting preventive care can go a long way to promote health and wellness. Talk with your health care provider about what schedule of regular examinations is right for you. This is a good chance for you to check in with your provider about disease prevention and staying healthy. In between checkups, there are plenty of things you can do on your own. Experts have done a lot  of research about which lifestyle changes and preventive measures are most likely to keep you healthy. Ask your health care provider for more information. Weight and diet Eat a healthy diet  Be sure to include plenty of vegetables, fruits, low-fat dairy products, and lean protein.  Do not eat a lot of foods high in solid fats, added sugars, or salt.  Get regular exercise. This is one of the most important things you can do for your health. ? Most adults should exercise for at least 150 minutes each week. The exercise should increase your heart rate and make you sweat (moderate-intensity exercise). ? Most adults should also do strengthening exercises at least twice a week. This is in addition to the moderate-intensity exercise.  Maintain a healthy weight  Body mass index (BMI) is a measurement that can be used to identify possible weight problems. It estimates body fat based on height and weight. Your health care provider can help determine your BMI and help you achieve or maintain a healthy weight.  For females 44 years of age and older: ? A BMI below 18.5 is considered underweight. ? A BMI of 18.5 to 24.9 is normal. ? A BMI of 25 to 29.9 is considered overweight. ? A BMI of 30 and above is considered obese.  Watch levels of cholesterol and blood lipids  You should start having your blood tested for lipids and cholesterol at 40 years of age, then have this test every 5 years.  You may need to have your cholesterol levels checked more often if: ? Your lipid or cholesterol levels are high. ? You are older  than 40 years of age. ? You are at high risk for heart disease.  Cancer screening Lung Cancer  Lung cancer screening is recommended for adults 16-5 years old who are at high risk for lung cancer because of a history of smoking.  A yearly low-dose CT scan of the lungs is recommended for people who: ? Currently smoke. ? Have quit within the past 15 years. ? Have at least a  30-pack-year history of smoking. A pack year is smoking an average of one pack of cigarettes a day for 1 year.  Yearly screening should continue until it has been 15 years since you quit.  Yearly screening should stop if you develop a health problem that would prevent you from having lung cancer treatment.  Breast Cancer  Practice breast self-awareness. This means understanding how your breasts normally appear and feel.  It also means doing regular breast self-exams. Let your health care provider know about any changes, no matter how small.  If you are in your 20s or 30s, you should have a clinical breast exam (CBE) by a health care provider every 1-3 years as part of a regular health exam.  If you are 69 or older, have a CBE every year. Also consider having a breast X-ray (mammogram) every year.  If you have a family history of breast cancer, talk to your health care provider about genetic screening.  If you are at high risk for breast cancer, talk to your health care provider about having an MRI and a mammogram every year.  Breast cancer gene (BRCA) assessment is recommended for women who have family members with BRCA-related cancers. BRCA-related cancers include: ? Breast. ? Ovarian. ? Tubal. ? Peritoneal cancers.  Results of the assessment will determine the need for genetic counseling and BRCA1 and BRCA2 testing.  Cervical Cancer Your health care provider may recommend that you be screened regularly for cancer of the pelvic organs (ovaries, uterus, and vagina). This screening involves a pelvic examination, including checking for microscopic changes to the surface of your cervix (Pap test). You may be encouraged to have this screening done every 3 years, beginning at age 57.  For women ages 1-65, health care providers may recommend pelvic exams and Pap testing every 3 years, or they may recommend the Pap and pelvic exam, combined with testing for human papilloma virus (HPV), every  5 years. Some types of HPV increase your risk of cervical cancer. Testing for HPV may also be done on women of any age with unclear Pap test results.  Other health care providers may not recommend any screening for nonpregnant women who are considered low risk for pelvic cancer and who do not have symptoms. Ask your health care provider if a screening pelvic exam is right for you.  If you have had past treatment for cervical cancer or a condition that could lead to cancer, you need Pap tests and screening for cancer for at least 20 years after your treatment. If Pap tests have been discontinued, your risk factors (such as having a new sexual partner) need to be reassessed to determine if screening should resume. Some women have medical problems that increase the chance of getting cervical cancer. In these cases, your health care provider may recommend more frequent screening and Pap tests.  Colorectal Cancer  This type of cancer can be detected and often prevented.  Routine colorectal cancer screening usually begins at 40 years of age and continues through 40 years of age.  Your  health care provider may recommend screening at an earlier age if you have risk factors for colon cancer.  Your health care provider may also recommend using home test kits to check for hidden blood in the stool.  A small camera at the end of a tube can be used to examine your colon directly (sigmoidoscopy or colonoscopy). This is done to check for the earliest forms of colorectal cancer.  Routine screening usually begins at age 35.  Direct examination of the colon should be repeated every 5-10 years through 40 years of age. However, you may need to be screened more often if early forms of precancerous polyps or small growths are found.  Skin Cancer  Check your skin from head to toe regularly.  Tell your health care provider about any new moles or changes in moles, especially if there is a change in a mole's shape  or color.  Also tell your health care provider if you have a mole that is larger than the size of a pencil eraser.  Always use sunscreen. Apply sunscreen liberally and repeatedly throughout the day.  Protect yourself by wearing long sleeves, pants, a wide-brimmed hat, and sunglasses whenever you are outside.  Heart disease, diabetes, and high blood pressure  High blood pressure causes heart disease and increases the risk of stroke. High blood pressure is more likely to develop in: ? People who have blood pressure in the high end of the normal range (130-139/85-89 mm Hg). ? People who are overweight or obese. ? People who are African American.  If you are 47-63 years of age, have your blood pressure checked every 3-5 years. If you are 45 years of age or older, have your blood pressure checked every year. You should have your blood pressure measured twice-once when you are at a hospital or clinic, and once when you are not at a hospital or clinic. Record the average of the two measurements. To check your blood pressure when you are not at a hospital or clinic, you can use: ? An automated blood pressure machine at a pharmacy. ? A home blood pressure monitor.  If you are between 69 years and 55 years old, ask your health care provider if you should take aspirin to prevent strokes.  Have regular diabetes screenings. This involves taking a blood sample to check your fasting blood sugar level. ? If you are at a normal weight and have a low risk for diabetes, have this test once every three years after 40 years of age. ? If you are overweight and have a high risk for diabetes, consider being tested at a younger age or more often. Preventing infection Hepatitis B  If you have a higher risk for hepatitis B, you should be screened for this virus. You are considered at high risk for hepatitis B if: ? You were born in a country where hepatitis B is common. Ask your health care provider which countries  are considered high risk. ? Your parents were born in a high-risk country, and you have not been immunized against hepatitis B (hepatitis B vaccine). ? You have HIV or AIDS. ? You use needles to inject street drugs. ? You live with someone who has hepatitis B. ? You have had sex with someone who has hepatitis B. ? You get hemodialysis treatment. ? You take certain medicines for conditions, including cancer, organ transplantation, and autoimmune conditions.  Hepatitis C  Blood testing is recommended for: ? Everyone born from [birth year] through 1963. ?  Anyone with known risk factors for hepatitis C.  Sexually transmitted infections (STIs)  You should be screened for sexually transmitted infections (STIs) including gonorrhea and chlamydia if: ? You are sexually active and are younger than 40 years of age. ? You are older than 40 years of age and your health care provider tells you that you are at risk for this type of infection. ? Your sexual activity has changed since you were last screened and you are at an increased risk for chlamydia or gonorrhea. Ask your health care provider if you are at risk.  If you do not have HIV, but are at risk, it may be recommended that you take a prescription medicine daily to prevent HIV infection. This is called pre-exposure prophylaxis (PrEP). You are considered at risk if: ? You are sexually active and do not regularly use condoms or know the HIV status of your partner(s). ? You take drugs by injection. ? You are sexually active with a partner who has HIV.  Talk with your health care provider about whether you are at high risk of being infected with HIV. If you choose to begin PrEP, you should first be tested for HIV. You should then be tested every 3 months for as long as you are taking PrEP. Pregnancy  If you are premenopausal and you may become pregnant, ask your health care provider about preconception counseling.  If you may become pregnant, take 400  to 800 micrograms (mcg) of folic acid every day.  If you want to prevent pregnancy, talk to your health care provider about birth control (contraception). Osteoporosis and menopause  Osteoporosis is a disease in which the bones lose minerals and strength with aging. This can result in serious bone fractures. Your risk for osteoporosis can be identified using a bone density scan.  If you are 69 years of age or older, or if you are at risk for osteoporosis and fractures, ask your health care provider if you should be screened.  Ask your health care provider whether you should take a calcium or vitamin D supplement to lower your risk for osteoporosis.  Menopause may have certain physical symptoms and risks.  Hormone replacement therapy may reduce some of these symptoms and risks. Talk to your health care provider about whether hormone replacement therapy is right for you. Follow these instructions at home:  Schedule regular health, dental, and eye exams.  Stay current with your immunizations.  Do not use any tobacco products including cigarettes, chewing tobacco, or electronic cigarettes.  If you are pregnant, do not drink alcohol.  If you are breastfeeding, limit how much and how often you drink alcohol.  Limit alcohol intake to no more than 1 drink per day for nonpregnant women. One drink equals 12 ounces of beer, 5 ounces of wine, or 1 ounces of hard liquor.  Do not use street drugs.  Do not share needles.  Ask your health care provider for help if you need support or information about quitting drugs.  Tell your health care provider if you often feel depressed.  Tell your health care provider if you have ever been abused or do not feel safe at home. This information is not intended to replace advice given to you by your health care provider. Make sure you discuss any questions you have with your health care provider. Document Released: 09/19/2010 Document Revised: 08/12/2015  Document Reviewed: 12/08/2014 Elsevier Interactive Patient Education  [employer].

## 2017-10-22 NOTE — Progress Notes (Signed)
Patient ID: Kathryn Munoz, female   DOB: [birth year], 42 y.o.   MRN: [id number]   Subjective:   Kathryn Munoz is a 42 y.o. female here for a complete physical exam  Interim issues since last visit: her mood has really worsened in the last few months, really worse in the last 2 weeks; feels out of control; looks sad or down and other people have noticed; on citalopram; not missing doses, might take at different times; I asked about adding acid reflux medicine, ranitidine just this week; taking omeprazole too; no SI or HI; seeing a counselor; she is not taking the [place] out and leaving it out for one week; she just puts another one right in; last period was March  USPSTF grade A and B recommendations Depression:  Depression screen [facility] 2/9 10/22/2017 03/29/2017 03/21/2017 12/06/2016 10/16/2016  Decreased Interest 0 0 0 2 0  Down, Depressed, Hopeless 3 0 _0 PHQ - 2 Score 3 0 _1 Altered sleeping 3 - - 1 -  Tired, decreased energy 3 - - 2 -  Change in appetite 3 - - 3 -  Feeling bad or failure about yourself  3 - - 2 -  Trouble concentrating 3 - - 1 -  Moving slowly or fidgety/restless 0 - - 1 -  Suicidal thoughts 0 - - 0 -  PHQ-9 Score 18 - - 13 -  Difficult doing work/chores Very difficult - - - -   Hypertension: continuous Nuvaring BP Readings from Last 3 Encounters:  10/22/17 134/82  06/05/17 110/72  03/29/17 136/78   Obesity: check thyroid today Wt Readings from Last 3 Encounters:  10/22/17 251 lb 8 oz (114.1 kg)  06/05/17 249 lb (112.9 kg)  03/29/17 243 lb 12.8 oz (110.6 kg)   BMI Readings from Last 3 Encounters:  10/22/17 41.85 kg/m  06/05/17 41.44 kg/m  03/29/17 40.57 kg/m    Lab Results  Component Value Date   TSH 3.89 03/21/2017   T4TOTAL 11.1 03/21/2017    Skin cancer: no unusual moles Lung cancer:   Breast cancer:  Colorectal cancer:  Cervical cancer screening: was sent to Dr. Enzo Bi last fall, issue with Mirena and now on Nuvaring BRCA gene  screening: family hx of breast and/or ovarian cancer and/or metastatic prostate cancer?  HIV, hep B, hep C: works with blood at work, works with employee health STD testing and prevention (chl/gon/syphilis): not interested Intimate partner violence: no abuse Contraception: not using Nuvaring correctly; check hCG Osteoporosis: n/a Fall prevention/vitamin D: discussed Immunizations: tetanus UTD Diet: "it's not been good", patient thinks that is related to the depression, just constantly eating Exercise: "I can't physically", just goes to work and comes home, then falls on the bed; not exercising at all Alcohol: no Tobacco use: no AAA: n/a Aspirin: n/a Glucose:  Glucose  Date Value Ref Range Status  10/09/2014 87 65 - 99 mg/dL Final   Glucose, Bld  Date Value Ref Range Status  10/16/2016 80 65 - 99 mg/dL Final  10/11/2015 85 65 - 99 mg/dL Final   Lipids:  Lab Results  Component Value Date   CHOL 153 10/16/2016   CHOL 183 10/11/2015   CHOL 181 10/09/2014   Lab Results  Component Value Date   HDL 63 10/16/2016   HDL 51 10/11/2015   HDL 40 10/09/2014   Lab Results  Component Value Date   LDLCALC 76 10/16/2016   LDLCALC 109 10/11/2015   LDLCALC 113 (H)  10/09/2014   Lab Results  Component Value Date   TRIG 69 10/16/2016   TRIG 113 10/11/2015   TRIG 140 10/09/2014   Lab Results  Component Value Date   CHOLHDL 2.4 10/16/2016   CHOLHDL 3.6 10/11/2015   No results found for: LDLDIRECT   Past Medical History:  Diagnosis Date  . Abnormal mammogram    breast biopsy, [place] 2014  . Dermatitis   . History of gestational diabetes   . History of kidney stones 2014  . Hypothyroidism   . IFG (impaired fasting glucose)   . Irregular periods   . Neuropathy   . Obesity   . Vitamin D deficiency disease    Past Surgical History:  Procedure Laterality Date  . BREAST BIOPSY  2014   PASH  . INTRAUTERINE DEVICE INSERTION  03/11/2012   Family History  Problem Relation Age  of Onset  . Diabetes Mother   . Pancreatitis Mother   . Hypertension Mother   . Diabetes Sister        pre-diabetic  . Diabetes Brother   . ADD / ADHD Son   . Eczema Son   . Ovarian cancer Neg Hx   . Breast cancer Neg Hx    Social History   Tobacco Use  . Smoking status: Never Smoker  . Smokeless tobacco: Never Used  Substance Use Topics  . Alcohol use: No  . Drug use: No   Review of Systems  Objective:   Vitals:   10/22/17 0809  BP: 134/82  Pulse: 100  Resp: 12  Temp: 99 F (37.2 C)  TempSrc: Oral  SpO2: 97%  Weight: 251 lb 8 oz (114.1 kg)  Height: 5' 5" (1.651 m)   Body mass index is 41.85 kg/m. Wt Readings from Last 3 Encounters:  10/22/17 251 lb 8 oz (114.1 kg)  06/05/17 249 lb (112.9 kg)  03/29/17 243 lb 12.8 oz (110.6 kg)   Physical Exam  Constitutional: She appears well-developed and well-nourished.  HENT:  Head: Normocephalic and atraumatic.  Eyes: Conjunctivae and EOM are normal. Right eye exhibits no hordeolum. Left eye exhibits no hordeolum. No scleral icterus.  Neck: Carotid bruit is not present. No thyromegaly present.  Cardiovascular: Normal rate, regular rhythm, S1 normal, S2 normal and normal heart sounds.  No extrasystoles are present.  Pulmonary/Chest: Effort normal and breath sounds normal. No respiratory distress. Right breast exhibits no inverted nipple, no mass, no nipple discharge, no skin change and no tenderness. Left breast exhibits no inverted nipple, no mass, no nipple discharge, no skin change and no tenderness. Breasts are symmetrical.  Abdominal: Soft. Normal appearance and bowel sounds are normal. She exhibits no distension, no abdominal bruit, no pulsatile midline mass and no mass. There is no hepatosplenomegaly. There is no tenderness. No hernia.  Genitourinary: Uterus normal. Pelvic exam was performed with patient prone. There is no rash or lesion on the right labia. There is no rash or lesion on the left labia. Uterus is not  tender. Cervix exhibits friability. Cervix exhibits no motion tenderness. Right adnexum displays no mass, no tenderness and no fullness. Left adnexum displays no mass, no tenderness and no fullness. Vaginal discharge found.  Genitourinary Comments: Abnormal appearance to cervix; friable; nabothian cysts, glandular appearance  Musculoskeletal: Normal range of motion. She exhibits no edema.  Lymphadenopathy:       Head (right side): No submandibular adenopathy present.       Head (left side): No submandibular adenopathy present.    She  has no cervical adenopathy.    She has no axillary adenopathy.  Neurological: She is alert. She displays no tremor. No cranial nerve deficit. She exhibits normal muscle tone. Gait normal.  Skin: Skin is warm and dry. No bruising and no ecchymosis noted. No cyanosis. No pallor.  Velvety thickening of the nape of the neck, axillae c/w acanthosis nigracans; scale on the soles of the feet  Psychiatric: Her speech is normal and behavior is normal. Thought content normal. Her mood appears not anxious. She does not exhibit a depressed mood.    Assessment/Plan:   Problem List Items Addressed This Visit      Digestive   Acid reflux    Avoid omeprazole on the citalopram        Endocrine   IFG (impaired fasting glucose)    Check A1c; work on weight loss to reduce risk of type 2 DM      Relevant Orders   Hemoglobin A1c   Insulin, Free (Bioactive)   Hypothyroidism    Check TSH today        Musculoskeletal and Integument   Acanthosis nigricans    Explained risk of diabetes; sign of insulin resistance; most important thing she can do to lessen chance of developing diabetes is to lose weight; avoid sugary drinks        Other   Preventative health care - Primary   Relevant Orders   CBC with Differential/Platelet   COMPLETE METABOLIC PANEL WITH GFR   TSH   Lipid panel   RESOLVED: Weight gain   Moderate episode of recurrent major depressive disorder (HCC)     Increase SSRI; list of resources given; she agrees to get help if considering hurting herself; refer to psychiatrist; keep appt with counselor tomorrow; close f/u      Relevant Medications   citalopram (CELEXA) 40 MG tablet   Other Relevant Orders   VITAMIN D 25 Hydroxy (Vit-D Deficiency, Fractures)   Vitamin B12   Ambulatory referral to Psychiatry    Other Visit Diagnoses    Screening for breast cancer       Relevant Orders   MM 3D SCREEN BREAST BILATERAL   Cervical cancer screening       thin prep collected today   Relevant Orders   Cytology - PAP   Cytology - PAP   Missed menses       Relevant Orders   FSH/LH   hCG, serum, qualitative   Ambulatory referral to Obstetrics / Gynecology   Tinea pedis of both feet       use tolnaftate   Abnormal appearance of cervix       refer to GYN, Dr. Enzo Bi; told her to remove the [place] when she gets home today   Relevant Orders   Ambulatory referral to Obstetrics / Gynecology       Meds ordered this encounter  Medications  . citalopram (CELEXA) 40 MG tablet    Sig: Take 1 tablet (40 mg total) by mouth daily.    Dispense:  30 tablet    Refill:  3    Increasing the dose   Orders Placed This Encounter  Procedures  . MM 3D SCREEN BREAST BILATERAL    Standing Status:   Future    Standing Expiration Date:   12/23/2018    Order Specific Question:   Reason for Exam (SYMPTOM  OR DIAGNOSIS REQUIRED)    Answer:   screen for breast cancer    Order Specific Question:  Is the patient pregnant?    Answer:   No    Order Specific Question:   Preferred imaging location?    Answer:   [facility]  . CBC with Differential/Platelet  . COMPLETE METABOLIC PANEL WITH GFR  . TSH  . VITAMIN D 25 Hydroxy (Vit-D Deficiency, Fractures)  . Vitamin B12  . Lipid panel  . FSH/LH  . hCG, serum, qualitative  . Hemoglobin A1c  . Insulin, Free (Bioactive)  . Ambulatory referral to Psychiatry    Referral Priority:   Medium    Referral  Type:   Psychiatric    Referral Reason:   Specialty Services Required    Requested Specialty:   Psychiatry    Number of Visits Requested:   1  . Ambulatory referral to Obstetrics / Gynecology    Referral Priority:   Routine    Referral Type:   Consultation    Referral Reason:   Specialty Services Required    Requested Specialty:   Obstetrics and Gynecology    Number of Visits Requested:   1    Follow up plan: Return in about 3 weeks (around 11/09/2017).  An After Visit Summary was printed and given to the patient.

## 2017-10-22 NOTE — Assessment & Plan Note (Signed)
Check TSH today

## 2017-10-23 LAB — CYTOLOGY - PAP
BACTERIAL VAGINITIS: NEGATIVE
CANDIDA VAGINITIS: NEGATIVE
Chlamydia: NEGATIVE
Diagnosis: NEGATIVE
HPV (WINDOPATH): NOT DETECTED
Neisseria Gonorrhea: NEGATIVE
Trichomonas: NEGATIVE

## 2017-10-26 LAB — COMPLETE METABOLIC PANEL WITH GFR
AG Ratio: 1.3 (calc) (ref 1.0–2.5)
ALKALINE PHOSPHATASE (APISO): 53 U/L (ref 33–115)
ALT: 14 U/L (ref 6–29)
AST: 18 U/L (ref 10–30)
Albumin: 3.9 g/dL (ref 3.6–5.1)
BUN: 13 mg/dL (ref 7–25)
CALCIUM: 9.3 mg/dL (ref 8.6–10.2)
CHLORIDE: 104 mmol/L (ref 98–110)
CO2: 28 mmol/L (ref 20–32)
Creat: 0.72 mg/dL (ref 0.50–1.10)
GFR, Est African American: 121 mL/min/{1.73_m2} (ref >=60)
GFR, Est Non African American: 105 mL/min/{1.73_m2} (ref >=60)
GLOBULIN: 3.1 g/dL (ref 1.9–3.7)
Glucose, Bld: 137 mg/dL — ABNORMAL HIGH (ref 65–99)
POTASSIUM: 4.3 mmol/L (ref 3.5–5.3)
SODIUM: 140 mmol/L (ref 135–146)
Total Bilirubin: 0.7 mg/dL (ref 0.2–1.2)
Total Protein: 7 g/dL (ref 6.1–8.1)

## 2017-10-26 LAB — CBC WITH DIFFERENTIAL/PLATELET
BASOS ABS: 53 {cells}/uL (ref 0–200)
Basophils Relative: 0.8 %
EOS PCT: 2.7 %
Eosinophils Absolute: 178 cells/uL (ref 15–500)
HCT: 37.3 % (ref 35.0–45.0)
Hemoglobin: 12.1 g/dL (ref 11.7–15.5)
Lymphs Abs: 1914 cells/uL (ref 850–3900)
MCH: 30.6 pg (ref 27.0–33.0)
MCHC: 32.4 g/dL (ref 32.0–36.0)
MCV: 94.4 fL (ref 80.0–100.0)
MONOS PCT: 3.3 %
MPV: 9.3 fL (ref 7.5–12.5)
NEUTROS PCT: 64.2 %
Neutro Abs: 4237 cells/uL (ref 1500–7800)
Platelets: 322 10*3/uL (ref 140–400)
RBC: 3.95 10*6/uL (ref 3.80–5.10)
RDW: 10.8 % — AB (ref 11.0–15.0)
Total Lymphocyte: 29 %
WBC mixed population: 218 cells/uL (ref 200–950)
WBC: 6.6 10*3/uL (ref 3.8–10.8)

## 2017-10-26 LAB — HEMOGLOBIN A1C
Hgb A1c MFr Bld: 5.5 % of total Hgb (ref <5.7)
MEAN PLASMA GLUCOSE: 111 (calc)
eAG (mmol/L): 6.2 (calc)

## 2017-10-26 LAB — HCG, SERUM, QUALITATIVE: PREG SERUM: NEGATIVE

## 2017-10-26 LAB — VITAMIN D 25 HYDROXY (VIT D DEFICIENCY, FRACTURES): Vit D, 25-Hydroxy: 30 ng/mL (ref 30–100)

## 2017-10-26 LAB — LIPID PANEL
CHOLESTEROL: 136 mg/dL (ref <200)
HDL: 60 mg/dL (ref >50)
LDL Cholesterol (Calc): 58 mg/dL (calc)
Non-HDL Cholesterol (Calc): 76 mg/dL (calc) (ref <130)
Total CHOL/HDL Ratio: 2.3 (calc) (ref <5.0)
Triglycerides: 93 mg/dL (ref <150)

## 2017-10-26 LAB — TSH: TSH: 3.42 m[IU]/L

## 2017-10-26 LAB — INSULIN, FREE (BIOACTIVE): Insulin, Free: 44.8 u[IU]/mL — ABNORMAL HIGH (ref 1.5–14.9)

## 2017-10-26 LAB — FSH/LH
FSH: 5.1 m[IU]/mL
LH: 0.5 m[IU]/mL

## 2017-10-26 LAB — VITAMIN B12: Vitamin B-12: 478 pg/mL (ref 200–1100)

## 2017-10-27 ENCOUNTER — Other Ambulatory Visit: Payer: Self-pay | Admitting: Family Medicine

## 2017-10-27 DIAGNOSIS — E8881 Metabolic syndrome: Secondary | ICD-10-CM

## 2017-10-27 MED ORDER — METFORMIN HCL ER 500 MG PO TB24: ORAL_TABLET | ORAL | 0 refills | Status: DC

## 2017-10-27 NOTE — Assessment & Plan Note (Signed)
Start metformin.

## 2017-10-27 NOTE — Progress Notes (Signed)
Start metformin for insulin resistance

## 2017-11-08 ENCOUNTER — Other Ambulatory Visit: Payer: Self-pay

## 2017-11-08 ENCOUNTER — Encounter: Payer: Self-pay | Admitting: Family Medicine

## 2017-11-08 ENCOUNTER — Ambulatory Visit: Payer: BC Managed Care – PPO | Admitting: Family Medicine

## 2017-11-08 ENCOUNTER — Encounter: Payer: Self-pay | Admitting: Psychiatry

## 2017-11-08 ENCOUNTER — Ambulatory Visit
Admission: RE | Admit: 2017-11-08 | Discharge: 2017-11-08 | Disposition: A | Discharge status: Home / Self Care | Payer: BC Managed Care – PPO | Source: Ambulatory Visit | Attending: Family Medicine | Admitting: Family Medicine

## 2017-11-08 ENCOUNTER — Ambulatory Visit: Payer: BC Managed Care – PPO | Admitting: Psychiatry

## 2017-11-08 VITALS — BP 120/76 | HR 84 | Temp 99.6°F | Wt 247.8 lb

## 2017-11-08 VITALS — BP 118/74 | HR 96 | Temp 98.7°F | Ht 65.0 in | Wt 247.4 lb

## 2017-11-08 DIAGNOSIS — F3181 Bipolar II disorder: Secondary | ICD-10-CM

## 2017-11-08 DIAGNOSIS — F331 Major depressive disorder, recurrent, moderate: Secondary | ICD-10-CM

## 2017-11-08 DIAGNOSIS — F5105 Insomnia due to other mental disorder: Secondary | ICD-10-CM | POA: Diagnosis not present

## 2017-11-08 DIAGNOSIS — F429 Obsessive-compulsive disorder, unspecified: Secondary | ICD-10-CM | POA: Diagnosis not present

## 2017-11-08 DIAGNOSIS — Z23 Encounter for immunization: Secondary | ICD-10-CM

## 2017-11-08 DIAGNOSIS — Z1239 Encounter for other screening for malignant neoplasm of breast: Secondary | ICD-10-CM

## 2017-11-08 DIAGNOSIS — Z1231 Encounter for screening mammogram for malignant neoplasm of breast: Secondary | ICD-10-CM | POA: Insufficient documentation

## 2017-11-08 DIAGNOSIS — B372 Candidiasis of skin and nail: Secondary | ICD-10-CM

## 2017-11-08 DIAGNOSIS — R7301 Impaired fasting glucose: Secondary | ICD-10-CM | POA: Diagnosis not present

## 2017-11-08 MED ORDER — NYSTATIN 100000 UNIT/GM EX POWD: Freq: Three times a day (TID) | CUTANEOUS | 3 refills | Status: DC

## 2017-11-08 MED ORDER — LAMOTRIGINE 25 MG PO TABS: 25.0000 mg | ORAL_TABLET | Freq: Every day | ORAL | 0 refills | Status: DC

## 2017-11-08 MED ORDER — TRAZODONE HCL 50 MG PO TABS: 25.0000 mg | ORAL_TABLET | Freq: Every day | ORAL | 1 refills | Status: DC
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
## 2017-11-08 NOTE — Progress Notes (Signed)
BP 118/74   Pulse 96   Temp 98.7 F (37.1 C)   Ht 5\' 5"  (1.651 m)   Wt 247 lb 6.4 oz (112.2 kg)   SpO2 98%   BMI 41.17 kg/m    Subjective:    Patient ID: Kathryn Munoz, female    DOB: 21-Aug-1975, 42 y.o.   MRN: [id number]  HPI: Kathryn Munoz is a 42 y.o. female  Chief Complaint  Patient presents with  . Follow-up  . Rash    groin area    HPI She was seen for her physical on August 5th She was referred to GYN for abnormal appearance of cervix  She has a rash in the groin area; she says it has been coming and going for a while; GYN gave her a cream for it; Dr. Algis Downs treated it with nystatin  Referred to psychiatry and citalopram was increased; PHQ-9 score has improved from 18 to 7; she had a set back on Sunday, felt really blah; then Monday felt like she had a panic attack; just froze and couldn't go go to work; couldn't get past the thoughts; Sunday and Monday were not good days; Tuesday was a little bit better, able to go to work; Wed was a day that she felt like herself in two weeks; felt like normal, like she was back to normal; going to therapy; they are having her work on things; she is doing CBT; the depression and anxiety are there, and they are working on OCD; she is going to see psychiatrist this afternoon; she wants to be evaluated for rapid cycling bipolar; no fam hx of bipolar d/o Her sleep comes and goes; Tuesday night had an awesome night sleep and woke up Wed feeling good; last night, back to tossing and turning  Insulin resistant; lost four pounds since last visit; she has started walking, gone to the gym; quit eating fast food; eating mostly salads for lunch and normal dinner; on metformin now  Depression screen [facility] 2/9 11/08/2017 10/22/2017 03/29/2017 03/21/2017 12/06/2016  Decreased Interest 1 0 0 0 2  Down, Depressed, Hopeless 1 3 0 1 1  PHQ - 2 Score 2 3 0 1 3  Altered sleeping 1 3 - - 1  Tired, decreased energy 1 3 - - 2  Change in appetite 0 3 - - 3  Feeling  bad or failure about yourself  1 3 - - 2  Trouble concentrating 1 3 - - 1  Moving slowly or fidgety/restless 1 0 - - 1  Suicidal thoughts 0 0 - - 0  PHQ-9 Score 7 18 - - 13  Difficult doing work/chores Very difficult Very difficult - - -    Relevant past medical, surgical, family and social history reviewed Past Medical History:  Diagnosis Date  . Abnormal mammogram    breast biopsy, PASH 2014  . Dermatitis   . History of gestational diabetes   . History of kidney stones 2014  . Hypothyroidism   . IFG (impaired fasting glucose)   . Irregular periods   . Neuropathy   . Obesity   . Vitamin D deficiency disease    Past Surgical History:  Procedure Laterality Date  . BREAST BIOPSY  2014   PASH  . INTRAUTERINE DEVICE INSERTION  03/11/2012   Family History  Problem Relation Age of Onset  . Diabetes Mother   . Pancreatitis Mother   . Hypertension Mother   . Diabetes Sister  pre-diabetic  . Diabetes Brother   . ADD / ADHD Son   . Eczema Son   . Ovarian cancer Neg Hx   . Breast cancer Neg Hx    Social History   Tobacco Use  . Smoking status: Never Smoker  . Smokeless tobacco: Never Used  Substance Use Topics  . Alcohol use: No  . Drug use: No    Interim medical history since last visit reviewed. Allergies and medications reviewed  Review of Systems Per HPI unless specifically indicated above     Objective:    BP 118/74   Pulse 96   Temp 98.7 F (37.1 C)   Ht 5\' 5"  (1.651 m)   Wt 247 lb 6.4 oz (112.2 kg)   SpO2 98%   BMI 41.17 kg/m   Wt Readings from Last 3 Encounters:  11/08/17 247 lb 6.4 oz (112.2 kg)  10/22/17 251 lb 8 oz (114.1 kg)  06/05/17 249 lb (112.9 kg)    Physical Exam  Constitutional: She appears well-developed and well-nourished. No distress.  Eyes: EOM are normal. No scleral icterus.  Neck: No thyromegaly present.  Cardiovascular: Normal rate.  Pulmonary/Chest: Effort normal.  Abdominal: She exhibits no distension.  Skin:  Rash (macerated hyperpigmented slightly red rash in creases lower abdomen) noted. No pallor.     Psychiatric: She has a normal mood and affect. Her behavior is normal. Judgment and thought content normal. Her mood appears not anxious. She does not exhibit a depressed mood.    Results for orders placed or performed in visit on 10/22/17  CBC with Differential/Platelet  Result Value Ref Range   WBC 6.6 3.8 - 10.8 Thousand/uL   RBC 3.95 3.80 - 5.10 Million/uL   Hemoglobin 12.1 11.7 - 15.5 g/dL   HCT 40.937.3 81.135.0 - 91.445.0 %   MCV 94.4 80.0 - 100.0 fL   MCH 30.6 27.0 - 33.0 pg   MCHC 32.4 32.0 - 36.0 g/dL   RDW 78.210.8 (L) 95.611.0 - 21.315.0 %   Platelets 322 140 - 400 Thousand/uL   MPV 9.3 7.5 - 12.5 fL   Neutro Abs 4,237 1,500 - 7,800 cells/uL   Lymphs Abs 1,914 850 - 3,900 cells/uL   WBC mixed population 218 200 - 950 cells/uL   Eosinophils Absolute 178 15 - 500 cells/uL   Basophils Absolute 53 0 - 200 cells/uL   Neutrophils Relative % 64.2 %   Total Lymphocyte 29.0 %   Monocytes Relative 3.3 %   Eosinophils Relative 2.7 %   Basophils Relative 0.8 %  COMPLETE METABOLIC PANEL WITH GFR  Result Value Ref Range   Glucose, Bld 137 (H) 65 - 99 mg/dL   BUN 13 7 - 25 mg/dL   Creat 0.860.72 5.780.50 - 4.691.10 mg/dL   GFR, Est Non African American 105 > OR = 60 mL/min/1.373m2   GFR, Est African American 121 > OR = 60 mL/min/1.7173m2   BUN/Creatinine Ratio NOT APPLICABLE 6 - 22 (calc)   Sodium 140 135 - 146 mmol/L   Potassium 4.3 3.5 - 5.3 mmol/L   Chloride 104 98 - 110 mmol/L   CO2 28 20 - 32 mmol/L   Calcium 9.3 8.6 - 10.2 mg/dL   Total Protein 7.0 6.1 - 8.1 g/dL   Albumin 3.9 3.6 - 5.1 g/dL   Globulin 3.1 1.9 - 3.7 g/dL (calc)   AG Ratio 1.3 1.0 - 2.5 (calc)   Total Bilirubin 0.7 0.2 - 1.2 mg/dL   Alkaline phosphatase (APISO) 53 33 -  115 U/L   AST 18 10 - 30 U/L   ALT 14 6 - 29 U/L  TSH  Result Value Ref Range   TSH 3.42 mIU/L  VITAMIN D 25 Hydroxy (Vit-D Deficiency, Fractures)  Result Value Ref Range     Vit D, 25-Hydroxy 30 30 - 100 ng/mL  Vitamin B12  Result Value Ref Range   Vitamin B-12 478 200 - 1,100 pg/mL  Lipid panel  Result Value Ref Range   Cholesterol 136 <200 mg/dL   HDL 60 >16 mg/dL   Triglycerides 93 <109 mg/dL   LDL Cholesterol (Calc) 58 mg/dL (calc)   Total CHOL/HDL Ratio 2.3 <5.0 (calc)   Non-HDL Cholesterol (Calc) 76 <604 mg/dL (calc)  FSH/LH  Result Value Ref Range   FSH 5.1 mIU/mL   LH 0.5 mIU/mL  hCG, serum, qualitative  Result Value Ref Range   Preg, Serum NEGATIVE   Hemoglobin A1c  Result Value Ref Range   Hgb A1c MFr Bld 5.5 <5.7 % of total Hgb   Mean Plasma Glucose 111 (calc)   eAG (mmol/L) 6.2 (calc)  Insulin, Free (Bioactive)  Result Value Ref Range   Insulin, Free 44.8 (H) 1.5 - 14.9 uIU/mL  Cytology - PAP  Result Value Ref Range   Adequacy      Satisfactory for evaluation  endocervical/transformation zone component PRESENT.   Diagnosis      NEGATIVE FOR INTRAEPITHELIAL LESIONS OR MALIGNANCY.   Bacterial vaginitis Negative for Bacterial Vaginitis Microorganisms    Candida vaginitis Negative for Candida species    Chlamydia Negative    Neisseria gonorrhea Negative    HPV NOT DETECTED    Trichomonas Negative    Material Submitted CervicoVaginal Pap [ThinPrep Imaged]       Assessment & Plan:   Problem List Items Addressed This Visit      Endocrine   IFG (impaired fasting glucose)    Working hard on weight loss by execising and eating better        Other   Moderate episode of recurrent major depressive disorder (HCC)    Improving, seeing psychiatrist today; so glad she is working with therapist doing CBT       Other Visit Diagnoses    Need for influenza vaccination    -  Primary   Relevant Orders   Flu Vaccine QUAD 6+ mos PF IM (Fluarix Quad PF) (Completed)   Candidal skin infection       Relevant Medications   nystatin (MYCOSTATIN/NYSTOP) powder       Follow up plan: Return in about 3 months (around 02/08/2018) for  twenty minute follow-up with fasting labs.  An after-visit summary was printed and given to the patient at check-out.  Please see the patient instructions which may contain other information and recommendations beyond what is mentioned above in the assessment and plan.  Meds ordered this encounter  Medications  . nystatin (MYCOSTATIN/NYSTOP) powder    Sig: Apply topically 3 (three) times daily.    Dispense:  15 g    Refill:  3    Orders Placed This Encounter  Procedures  . Flu Vaccine QUAD 6+ mos PF IM (Fluarix Quad PF)

## 2017-11-08 NOTE — Patient Instructions (Signed)
Trazodone tablets What is this medicine? TRAZODONE (TRAZ oh done) is used to treat depression. This medicine may be used for other purposes; ask your health care provider or pharmacist if you have questions. COMMON BRAND NAME(S): Desyrel What should I tell my health care provider before I take this medicine? They need to know if you have any of these conditions: -attempted suicide or thinking about it -bipolar disorder -bleeding problems -glaucoma -heart disease, or previous heart attack -irregular heart beat -kidney or liver disease -low levels of sodium in the blood -an unusual or allergic reaction to trazodone, other medicines, foods, dyes or preservatives -pregnant or trying to get pregnant -breast-feeding How should I use this medicine? Take this medicine by mouth with a glass of water. Follow the directions on the prescription label. Take this medicine shortly after a meal or a light snack. Take your medicine at regular intervals. Do not take your medicine more often than directed. Do not stop taking this medicine suddenly except upon the advice of your doctor. Stopping this medicine too quickly may cause serious side effects or your condition may worsen. A special MedGuide will be given to you by the pharmacist with each prescription and refill. Be sure to read this information carefully each time. Talk to your pediatrician regarding the use of this medicine in children. Special care may be needed. Overdosage: If you think you have taken too much of this medicine contact a poison control center or emergency room at once. NOTE: This medicine is only for you. Do not share this medicine with others. What if I miss a dose? If you miss a dose, take it as soon as you can. If it is almost time for your next dose, take only that dose. Do not take double or extra doses. What may interact with this medicine? Do not take this medicine with any of the following medications: -certain medicines  for fungal infections like fluconazole, itraconazole, ketoconazole, posaconazole, voriconazole -cisapride -dofetilide -dronedarone -linezolid -MAOIs like Carbex, Eldepryl, Marplan, Nardil, and Parnate -mesoridazine -methylene blue (injected into a vein) -pimozide -saquinavir -thioridazine -ziprasidone This medicine may also interact with the following medications: -alcohol -antiviral medicines for HIV or AIDS -aspirin and aspirin-like medicines -barbiturates like phenobarbital -certain medicines for blood pressure, heart disease, irregular heart beat -certain medicines for depression, anxiety, or psychotic disturbances -certain medicines for migraine headache like almotriptan, eletriptan, frovatriptan, naratriptan, rizatriptan, sumatriptan, zolmitriptan -certain medicines for seizures like carbamazepine and phenytoin -certain medicines for sleep -certain medicines that treat or prevent blood clots like dalteparin, enoxaparin, warfarin -digoxin -fentanyl -lithium -NSAIDS, medicines for pain and inflammation, like ibuprofen or naproxen -other medicines that prolong the QT interval (cause an abnormal heart rhythm) -rasagiline -supplements like St. John's wort, kava kava, valerian -tramadol -tryptophan This list may not describe all possible interactions. Give your health care provider a list of all the medicines, herbs, non-prescription drugs, or dietary supplements you use. Also tell them if you smoke, drink alcohol, or use illegal drugs. Some items may interact with your medicine. What should I watch for while using this medicine? Tell your doctor if your symptoms do not get better or if they get worse. Visit your doctor or health care professional for regular checks on your progress. Because it may take several weeks to see the full effects of this medicine, it is important to continue your treatment as prescribed by your doctor. Patients and their families should watch out for new  or worsening thoughts of suicide or depression. Also   watch out for sudden changes in feelings such as feeling anxious, agitated, panicky, irritable, hostile, aggressive, impulsive, severely restless, overly excited and hyperactive, or not being able to sleep. If this happens, especially at the beginning of treatment or after a change in dose, call your health care professional. Dennis Bast may get drowsy or dizzy. Do not drive, use machinery, or do anything that needs mental alertness until you know how this medicine affects you. Do not stand or sit up quickly, especially if you are an older patient. This reduces the risk of dizzy or fainting spells. Alcohol may interfere with the effect of this medicine. Avoid alcoholic drinks. This medicine may cause dry eyes and blurred vision. If you wear contact lenses you may feel some discomfort. Lubricating drops may help. See your eye doctor if the problem does not go away or is severe. Your mouth may get dry. Chewing sugarless gum, sucking hard candy and drinking plenty of water may help. Contact your doctor if the problem does not go away or is severe. What side effects may I notice from receiving this medicine? Side effects that you should report to your doctor or health care professional as soon as possible: -allergic reactions like skin rash, itching or hives, swelling of the face, lips, or tongue -elevated mood, decreased need for sleep, racing thoughts, impulsive behavior -confusion -fast, irregular heartbeat -feeling faint or lightheaded, falls -feeling agitated, angry, or irritable -loss of balance or coordination -painful or prolonged erections -restlessness, pacing, inability to keep still -suicidal thoughts or other mood changes -tremors -trouble sleeping -seizures -unusual bleeding or bruising Side effects that usually do not require medical attention (report to your doctor or health care professional if they continue or are bothersome): -change in  sex drive or performance -change in appetite or weight -constipation -headache -muscle aches or pains -nausea This list may not describe all possible side effects. Call your doctor for medical advice about side effects. You may report side effects to FDA at 1-800-FDA-1088. Where should I keep my medicine? Keep out of the reach of children. Store at room temperature between 15 and 30 degrees C (59 to 86 degrees F). Protect from light. Keep container tightly closed. Throw away any unused medicine after the expiration date. NOTE: This sheet is a summary. It may not cover all possible information. If you have questions about this medicine, talk to your doctor, pharmacist, or health care provider.  2018 Elsevier/Gold Standard (2015-08-05 16:57:05) Lamotrigine tablets What is this medicine? LAMOTRIGINE (la MOE Hendricks Limes) is used to control seizures in adults and children with epilepsy and Lennox-Gastaut syndrome. It is also used in adults to treat bipolar disorder. This medicine may be used for other purposes; ask your health care provider or pharmacist if you have questions. COMMON BRAND NAME(S): Lamictal What should I tell my health care provider before I take this medicine? They need to know if you have any of these conditions: -a history of depression or bipolar disorder -aseptic meningitis during prior use of lamotrigine -folate deficiency -kidney disease -liver disease -suicidal thoughts, plans, or attempt; a previous suicide attempt by you or a family member -an unusual or allergic reaction to lamotrigine or other seizure medications, other medicines, foods, dyes, or preservatives -pregnant or trying to get pregnant -breast-feeding How should I use this medicine? Take this medicine by mouth with a glass of water. Follow the directions on the prescription label. Do not chew these tablets. If this medicine upsets your stomach, take it with food  or milk. Take your doses at regular  intervals. Do not take your medicine more often than directed. A special MedGuide will be given to you by the pharmacist with each new prescription and refill. Be sure to read this information carefully each time. Talk to your pediatrician regarding the use of this medicine in children. While this drug may be prescribed for children as young as 2 years for selected conditions, precautions do apply. Overdosage: If you think you have taken too much of this medicine contact a poison control center or emergency room at once. NOTE: This medicine is only for you. Do not share this medicine with others. What if I miss a dose? If you miss a dose, take it as soon as you can. If it is almost time for your next dose, take only that dose. Do not take double or extra doses. What may interact with this medicine? -carbamazepine -female hormones, including contraceptive or birth control pills -methotrexate -phenobarbital -phenytoin -primidone -pyrimethamine -rifampin -trimethoprim -valproic acid This list may not describe all possible interactions. Give your health care provider a list of all the medicines, herbs, non-prescription drugs, or dietary supplements you use. Also tell them if you smoke, drink alcohol, or use illegal drugs. Some items may interact with your medicine. What should I watch for while using this medicine? Visit your doctor or health care professional for regular checks on your progress. If you take this medicine for seizures, wear a Medic Alert bracelet or necklace. Carry an identification card with information about your condition, medicines, and doctor or health care professional. It is important to take this medicine exactly as directed. When first starting treatment, your dose will need to be adjusted slowly. It may take weeks or months before your dose is stable. You should contact your doctor or health care professional if your seizures get worse or if you have any new types of  seizures. Do not stop taking this medicine unless instructed by your doctor or health care professional. Stopping your medicine suddenly can increase your seizures or their severity. Contact your doctor or health care professional right away if you develop a rash while taking this medicine. Rashes may be very severe and sometimes require treatment in the hospital. Deaths from rashes have occurred. Serious rashes occur more often in children than adults taking this medicine. It is more common for these serious rashes to occur during the first 2 months of treatment, but a rash can occur at any time. You may get drowsy, dizzy, or have blurred vision. Do not drive, use machinery, or do anything that needs mental alertness until you know how this medicine affects you. To reduce dizzy or fainting spells, do not sit or stand up quickly, especially if you are an older patient. Alcohol can increase drowsiness and dizziness. Avoid alcoholic drinks. If you are taking this medicine for bipolar disorder, it is important to report any changes in your mood to your doctor or health care professional. If your condition gets worse, you get mentally depressed, feel very hyperactive or manic, have difficulty sleeping, or have thoughts of hurting yourself or committing suicide, you need to get help from your health care professional right away. If you are a caregiver for someone taking this medicine for bipolar disorder, you should also report these behavioral changes right away. The use of this medicine may increase the chance of suicidal thoughts or actions. Pay special attention to how you are responding while on this medicine. Your mouth may get  dry. Chewing sugarless gum or sucking hard candy, and drinking plenty of water may help. Contact your doctor if the problem does not go away or is severe. Women who become pregnant while using this medicine may enroll in the [place] [employer] Antiepileptic Drug Pregnancy Registry by  calling [phone]. This registry collects information about the safety of antiepileptic drug use during pregnancy. What side effects may I notice from receiving this medicine? Side effects that you should report to your doctor or health care professional as soon as possible: -allergic reactions like skin rash, itching or hives, swelling of the face, lips, or tongue -blurred or double vision -difficulty walking or controlling muscle movements -fever -headache, stiff neck, and sensitivity to light -painful sores in the mouth, eyes, or nose -redness, blistering, peeling or loosening of the skin, including inside the mouth -severe muscle pain -swollen lymph glands -uncontrollable eye movements -unusual bruising or bleeding -unusually weak or tired -vomiting -worsening of mood, thoughts or actions of suicide or dying -yellowing of the eyes or skin Side effects that usually do not require medical attention (report to your doctor or health care professional if they continue or are bothersome): -diarrhea or constipation -difficulty sleeping -nausea -tremors This list may not describe all possible side effects. Call your doctor for medical advice about side effects. You may report side effects to FDA at 1-800-FDA-1088. Where should I keep my medicine? Keep out of reach of children. Store at room temperature between 15 and 30 degrees C (59 and 86 degrees F). Throw away any unused medicine after the expiration date. NOTE: This sheet is a summary. It may not cover all possible information. If you have questions about this medicine, talk to your doctor, pharmacist, or health care provider.  2018 Elsevier/Gold Standard (2015-04-08 09:29:40) Bipolar 2 Disorder Bipolar 2 disorder is a mental health disorder in which a person has episodes of emotional highs (mania) and lows (depression). Bipolar 2 is different from other bipolar disorders because the manic episodes are not as high and do not last  as long. This is called hypomania. People with bipolar 2 disorder usually go back and forth between hypomanic and depressive episodes. What are the causes? The cause of this condition is not known. What increases the risk? The following factors may make you more likely to develop this condition:  Having a family member with the disorder.  An imbalance of certain chemicals in the brain (neurotransmitters).  Stress, such as a death, illness, or financial problems.  Certain conditions that affect the brain or spinal cord (neurologic conditions).  Brain injury (trauma).  Having another mental health disorder, such as: ? Obsessive compulsive disorder. ? Schizophrenia.  What are the signs or symptoms? Symptoms of hypomania include:  Very high self-esteem or self-confidence.  Decreased need for sleep.  Unusual talkativeness or feeling a need to keep talking. Speech may be very fast. It may seem like you cannot stop talking.  Racing thoughts or constant talking, with quick shifts between topics that may or may not be related (flight of ideas).  Decreased ability to focus or concentrate.  Increased purposeful activity, such as work, studies, or social activity.  Increased nonproductive activity. This could be pacing, squirming and fidgeting, or finger and toe tapping.  Impulsive behavior and poor judgment. This may result in high-risk activities, such as having unprotected sex or spending a lot of money.  Symptoms of depression include:  Feeling sad, hopeless, or helpless.  Frequent or uncontrollable crying.  Lack of feeling  or caring about anything.  Sleeping too much.  Moving more slowly than usual.  Not being able to enjoy things you used to enjoy.  Desire to be alone all the time.  Feeling guilty or worthless.  Lack of energy or motivation.  Trouble concentrating or remembering.  Trouble making decisions.  Increased appetite.  Thoughts of death or desire to  harm yourself.  How is this diagnosed? To diagnose bipolar 2 disorder, your health care provider may ask about your:  Emotional episodes.  Medical history.  Alcohol and drug use. This includes prescription medicines. Certain medical conditions and substances can cause symptoms that seem like bipolar disorder (secondary bipolar disorder).  How is this treated? Bipolar 2 disorder is a long-term (chronic) illness. It is best controlled with ongoing (continuous) treatment rather than being treated only when symptoms occur. Treatment may include:  Psychotherapy. Some forms of talk therapy, such as cognitive-behavioral therapy (CBT), can provide support, education, and guidance.  Coping strategies, such as journaling or relaxation exercises. Relaxation exercises include: ? Yoga. ? Meditation. ? Deep breathing.  Lifestyle changes, such as: ? Limiting alcohol and drug use. ? Exercising regularly. ? Getting plenty of sleep. ? Making healthy eating choices.  Medicine. Medicine can be prescribed by a health care provider who specializes in treating mental disorders (psychiatrist). ? Medicines called mood stabilizers are usually prescribed. ? If symptoms occur even while taking a mood stabilizer, other medicines may be added.  A combination of medicine, talk therapy, and coping methods is the best way to treat this condition. Follow these instructions at home: Activity  Return to your normal activities as told by your health care provider.  Find activities that you enjoy, and make time to do them.  Exercise regularly as told by your health care provider. Lifestyle  Limit alcohol intake to no more than 1 drink a day for nonpregnant women and 2 drinks a day for men. One drink equals 12 oz of beer, 5 oz of wine, or 1 oz of hard liquor.  Follow a set schedule for eating and sleeping.  Eat a balanced diet that includes fresh fruits and vegetables, whole grains, low-fat dairy, and lean  meats.  Get at least 7-8 hours of sleep each night. General instructions  Take over-the-counter and prescription medicines only as told by your health care provider.  Think about joining a support group. Your health care provider may be able to recommend a support group.  Talk with your family and loved ones about your treatment goals and how they can help.  Keep all follow-up visits as told by your health care provider. This is important. Where to find more information: For more information about bipolar 2 disorder, visit the following websites:  [employer] Mental Illness: www.nami.org  [employer] of Mental Health: http://www.maynard.net/  Contact a health care provider if:  Your symptoms get worse.  You have side effects from your medicine, and they get worse.  You have trouble sleeping.  You have trouble doing daily activities.  You feel unsafe in your surroundings.  You are dealing with substance abuse. Get help right away if:  You have new symptoms.  You have thoughts about harming yourself or others.  You harm yourself. Summary  Bipolar 2 disorder is a mental health disorder in which a person has episodes of hypomania and depression.  Bipolar 2 is best treated through a combination of medicines, talk therapy, and coping strategies.  Talk with your family and loved  ones about your treatment goals and how they can help. This information is not intended to replace advice given to you by your health care provider. Make sure you discuss any questions you have with your health care provider. Document Released: 04/11/2016 Document Revised: 04/11/2016 Document Reviewed: 04/11/2016 Elsevier Interactive Patient Education  2018 [employer] Inc. Obsessive-Compulsive Disorder Obsessive-compulsive disorder (OCD) is a brain-based anxiety disorder. People with OCD have obsessions, compulsions, or both. Obsessions are unwanted and distressing thoughts, ideas, or  urges that keep entering your mind and result in anxiety. You may find yourself trying to ignore them. You may try to stop or undo them with a compulsion. Compulsions are repetitive physical or mental acts that you feel you have to do. They may reduce or prevent any emotional distress, but in most instances, they are ineffective. Compulsions can be very time-consuming, often taking more than one hour each day. They can interfere with personal relationships and normal activities at home, school, or work. OCD can begin in childhood, but it usually starts in young adulthood and continues throughout life. Many people with OCD also have depression or another mental health disorder. What are the causes? The cause of this condition is not known. What increases the risk? This condition is more like to develop in:  People who have experienced trauma.  People who have a family history of OCD.  Women during and after pregnancy.  People who have infections and post-infectious autoimmune syndrome.  People who have other mental health conditions.  People who abuse substances.  What are the signs or symptoms? Symptoms of OCD include compulsions and obsessions. People with obsessions usually have a fear that something terrible will happen or that they will do something terrible. Examples of common obsessions include:  Fear of contamination with germs, waste, or poisonous substances.  Fear of making the wrong decision.  Violent or sexual thoughts or urges towards others.  Need for symmetry or exactness.  Examples of common compulsions include:  Excessive handwashing or bathing due to fear of contamination.  Checking things over and over to make sure you finished a task, such as making sure you locked a door or unplugged a toaster.  Repeating an act or phrase over and over, sometimes a specific number of times, until it feels right.  Arranging and rearranging objects to keep them in a certain  order.  Having a very hard time making a decision and sticking to it.  How is this diagnosed? OCD is diagnosed through an assessment by your health care provider. Your health care provider will ask questions about any obsessions or compulsions you have and how they affect your life. Your health care provider may also ask about your medical history, prescription medicines, and drug use. Certain medical conditions and substances can cause symptoms that are similar to OCD. Your health care provider may also refer you to a mental health specialist. How is this treated? Treatment may include:  Cognitive therapy. This is a form of talk therapy. The goal is to identify and change the irrational thoughts associated with obsessions.  Behavioral therapy. A type of behavioral therapy called exposure and response prevention is often used. In this therapy, you will be exposed to the distressing situation that triggers your compulsion and be prevented from responding to it. With repetition of this process over time, you will no longer feel the distress or need to perform the compulsion.  Self-soothing. Meditation, deep breathing, or yoga can help you manage the physiological symptoms of  anxiety and can help with how you think.  Medicine. Certain types of antidepressant medicine may help reduce or control OCD symptoms. Medicine is most effective when used with cognitive or behavioral therapy.  Treatment usually involves a combination of therapy and medicines. For severe OCD that does not respond to talk therapy and medicine, brain surgery or electrical stimulation of specific areas of the brain (deep brain stimulation) may be considered. Follow these instructions at home:  Take over-the-counter and prescription medicines only as told by your health care provider. Do not start taking any new medicines with approval from your health care provider.  Consider joining a support group for people with OCD.  Keep  all follow-up visits as told by your health care provider. This is important. Contact a health care provider if:  You are not able to take your medicines as prescribed.  Your symptoms get worse. Get help right away if:  You have suicidal thoughts or thoughts about hurting yourself or others. If you ever feel like you may hurt yourself or others, or have thoughts about taking your own life, get help right away. You can go to your nearest emergency department or call:  Your local emergency services (911 in the [place]  A suicide crisis helpline, such as the National Suicide Prevention Lifeline at 412 623 1576. This is open 24-hours a day.  Summary  Obsessive-compulsive disorder (OCD) is a brain-based anxiety disorder. People with OCD have obsessions, compulsions, or both.  OCD can interfere with personal relationships and normal activities at home, school, or work.  Treatment usually involves a combination of therapy and medicines. This information is not intended to replace advice given to you by your health care provider. Make sure you discuss any questions you have with your health care provider. Document Released: 02/28/2001 Document Revised: 12/20/2015 Document Reviewed: 12/20/2015 Elsevier Interactive Patient Education  2018 [employer].

## 2017-11-08 NOTE — Assessment & Plan Note (Signed)
Improving, seeing psychiatrist today; so glad she is working with therapist doing CBT

## 2017-11-08 NOTE — Assessment & Plan Note (Signed)
Working hard on weight loss by execising and eating better

## 2017-11-08 NOTE — Patient Instructions (Signed)
Keep up the great job with exercising and eating better Call me if anything I can do Use the new powder Let things air out

## 2017-11-17 ENCOUNTER — Other Ambulatory Visit: Payer: Self-pay | Admitting: Family Medicine

## 2017-11-30 ENCOUNTER — Other Ambulatory Visit: Payer: Self-pay | Admitting: Psychiatry

## 2017-11-30 DIAGNOSIS — F3181 Bipolar II disorder: Secondary | ICD-10-CM

## 2017-11-30 MED ORDER — LAMOTRIGINE 25 MG PO TABS: 50.0000 mg | ORAL_TABLET | Freq: Every day | ORAL | 0 refills | Status: DC

## 2017-11-30 NOTE — Telephone Encounter (Signed)
Sent Lamictal to pharmacy 

## 2017-12-05 ENCOUNTER — Ambulatory Visit: Payer: BC Managed Care – PPO | Admitting: Psychiatry

## 2017-12-05 ENCOUNTER — Encounter: Payer: Self-pay | Admitting: Psychiatry

## 2017-12-05 VITALS — BP 124/80 | HR 82 | Ht 65.5 in | Wt 246.0 lb

## 2017-12-05 DIAGNOSIS — F5105 Insomnia due to other mental disorder: Secondary | ICD-10-CM | POA: Diagnosis not present

## 2017-12-05 DIAGNOSIS — F429 Obsessive-compulsive disorder, unspecified: Secondary | ICD-10-CM | POA: Diagnosis not present

## 2017-12-05 DIAGNOSIS — F3181 Bipolar II disorder: Secondary | ICD-10-CM | POA: Diagnosis not present

## 2017-12-05 MED ORDER — LAMOTRIGINE 25 MG PO TABS: 75.0000 mg | ORAL_TABLET | Freq: Every day | ORAL | 1 refills | Status: DC

## 2017-12-05 NOTE — Progress Notes (Signed)
BH MD OP Progress Note  12/05/2017 5:22 PM Kathryn Munoz  MRN:  [id number]  Chief Complaint: ' I am here for follow up." Chief Complaint    Follow-up     HPI: Kathryn Munoz is a 42 year old African-American female, married, lives in [place], has a history of mood lability, anxiety, insulin resistance, hypothyroidism, presented to the clinic today for a follow-up visit.  Patient today reports she had an incident at work few days ago when she got criticised by her supervisor for a mistake she made at work.  She reports this made her very depressed, she had racing thoughts as well as negative self-image .  She however reports the next day she felt angry and irritable.  She however reports she was able to cope with her emotions better than before.  She reports she was able to change it into something positive and try to look at her hand manual and has started learning about what mistake she had made.  She reports she is tolerating the Lamictal well.  She likes the effect.  She denies any side effects.  She is interested in increasing her dosage today.  She continues to take her Celexa.  She denies any sleep problems. She reports she takes the trazodone only as needed and does not take it every day.  Patient denies any suicidality or perceptual disturbances.  Patient denies any homicidality.  Patient continues to work with her therapist and reports her OCD symptoms as under better control. Visit Diagnosis:    ICD-10-CM   1. Bipolar 2 disorder, major depressive episode (HCC) F31.81 lamoTRIgine (LAMICTAL) 25 MG tablet  2. 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 F42.9   3. Insomnia due to mental condition F51.05     Past Psychiatric History: Reviewed past psychiatric history from my progress note on 11/08/2017.  Past trials of Celexa.  Continues to be in psychotherapy with Dr. Clarisa Kindredynthia Herlong in [place]  Past Medical History:  Past Medical History:  Diagnosis Date  . Abnormal  mammogram    breast biopsy, PASH 2014  . Anxiety   . Depression   . Dermatitis   . History of gestational diabetes   . History of kidney stones 2014  . Hypothyroidism   . IFG (impaired fasting glucose)   . Irregular periods   . Neuropathy   . Obesity   . Vitamin D deficiency disease     Past Surgical History:  Procedure Laterality Date  . BREAST BIOPSY Left 2014   [place] bx/clip-neg  . INTRAUTERINE DEVICE INSERTION  03/11/2012    Family Psychiatric History: Reviewed family psychiatric history from my progress note on 11/08/2017.  Family History:  Family History  Problem Relation Age of Onset  . Diabetes Mother   . Pancreatitis Mother   . Hypertension Mother   . Alcohol abuse Mother   . Schizophrenia Mother   . Depression Mother   . Diabetes Sister        pre-diabetic  . Diabetes Brother   . Drug abuse Brother   . ADD / ADHD Son   . Eczema Son   . Breast cancer Cousin        pat cousin  . Anxiety disorder Other   . Ovarian cancer Neg Hx     Social History: Reviewed social history from my progress note on 11/08/2017 Social History   Socioeconomic History  . Marital status: Married    Spouse name: jeffrey  . Number of children: 2  . Years  of education: Not on file  . Highest education level: Bachelor's degree (e.g., BA, AB, BS)  Occupational History  . Not on file  Social Needs  . Financial resource strain: Not hard at all  . Food insecurity:    Worry: Never true    Inability: Never true  . Transportation needs:    Medical: No    Non-medical: No  Tobacco Use  . Smoking status: Never Smoker  . Smokeless tobacco: Never Used  Substance and Sexual Activity  . Alcohol use: No  . Drug use: No  . Sexual activity: Yes    Partners: Male    Birth control/protection: Other-see comments    Comment: nuvaring  Lifestyle  . Physical activity:    Days per week: 3 days    Minutes per session: 60 min  . Stress: Very much  Relationships  . Social connections:     Talks on phone: Never    Gets together: Once a week    Attends religious service: More than 4 times per year    Active member of club or organization: Yes    Attends meetings of clubs or organizations: More than 4 times per year    Relationship status: Married  Other Topics Concern  . Not on file  Social History Narrative  . Not on file    Allergies:  Allergies  Allergen Reactions  . Date Seed Extract  [Zizyphus Jujuba] Anaphylaxis, Cough, Itching and Swelling    Metabolic Disorder Labs: Lab Results  Component Value Date   HGBA1C 5.5 10/22/2017   MPG 111 10/22/2017   MPG 88 10/11/2015   No results found for: PROLACTIN Lab Results  Component Value Date   CHOL 136 10/22/2017   TRIG 93 10/22/2017   HDL 60 10/22/2017   CHOLHDL 2.3 10/22/2017   VLDL 14 10/16/2016   LDLCALC 58 10/22/2017   LDLCALC 76 10/16/2016   Lab Results  Component Value Date   TSH 3.42 10/22/2017   TSH 3.89 03/21/2017    Therapeutic Level Labs: No results found for: LITHIUM No results found for: VALPROATE No components found for:  CBMZ  Current Medications: Current Outpatient Medications  Medication Sig Dispense Refill  . Cholecalciferol (VITAMIN D3) 1000 units CAPS Take 1,000 Int'l Units by mouth daily.     . citalopram (CELEXA) 40 MG tablet Take 1 tablet (40 mg total) by mouth daily. 30 tablet 3  . EPINEPHrine 0.3 mg/0.3 mL IJ SOAJ injection INJECT 0.3 MLS INTO THE MUSCLE ONCE FOR 1 DOSE. FOR LIFE-THREATENING ALLERGIC REACTION / ANAPHYLAXIS  1  . lamoTRIgine (LAMICTAL) 25 MG tablet Take 2 tablets (50 mg total) by mouth daily. 60 tablet 0  . levothyroxine (SYNTHROID, LEVOTHROID) 50 MCG tablet TAKE 1 TABLET (50 MCG TOTAL) BY MOUTH DAILY BEFORE BREAKFAST. 90 tablet 1  . metFORMIN (GLUCOPHAGE XR) 500 MG 24 hr tablet One by mouth daily for one week, then two daily 173 tablet 0  . Multiple Vitamins-Minerals (MULTIVITAMIN WITH MINERALS) tablet Take 1 tablet by mouth daily.    Marland Kitchen NUVARING 0.12-0.015  MG/24HR vaginal ring INSERT VAGINALLY AND LEAVE IN PLACE FOR 3 CONSECUTIVE WEEKS, THEN REMOVE FOR 1 WEEK. 1 each 12  . nystatin (MYCOSTATIN/NYSTOP) powder Apply topically 3 (three) times daily. 15 g 3  . nystatin-triamcinolone (MYCOLOG II) cream Apply 1 application topically 2 (two) times daily. For 10-14 days 60 g 1  . polyethylene glycol (MIRALAX / GLYCOLAX) packet Take 17 g by mouth daily.    Marland Kitchen lamoTRIgine (LAMICTAL)  25 MG tablet Take 3 tablets (75 mg total) by mouth daily. 90 tablet 1  . traZODone (DESYREL) 50 MG tablet TAKE 0.5-1 TABLETS (25-50 MG TOTAL) BY MOUTH AT BEDTIME. FOR SLEEP 90 tablet 1   No current facility-administered medications for this visit.      Musculoskeletal: Strength & Muscle Tone: within normal limits Gait & Station: normal Patient leans: N/A  Psychiatric Specialty Exam: Review of Systems  Psychiatric/Behavioral: The patient is nervous/anxious.   All other systems reviewed and are negative.   Blood pressure 124/80, pulse 82, height 5' 5.5" (1.664 m), weight 246 lb (111.6 kg), SpO2 96 %.Body mass index is 40.31 kg/m.  General Appearance: Casual  Eye Contact:  Fair  Speech:  Normal Rate  Volume:  Normal  Mood:  Anxious  Affect:  Congruent  Thought Process:  Goal Directed and Descriptions of Associations: Intact  Orientation:  Full (Time, Place, and Person)  Thought Content: Logical   Suicidal Thoughts:  No  Homicidal Thoughts:  No  Memory:  Immediate;   Fair Recent;   Fair Remote;   Fair  Judgement:  Fair  Insight:  Fair  Psychomotor Activity:  Normal  Concentration:  Concentration: Fair and Attention Span: Fair  Recall:  [name] of Knowledge: Fair  Language: Fair  Akathisia:  No  Handed:  Right  AIMS (if indicated): na  Assets:  Communication Skills Desire for Improvement Social Support  ADL's:  Intact  Cognition: WNL  Sleep:  Fair   Screenings: PHQ2-9     Office Visit from 11/08/2017 in [facility] Office Visit  from 10/22/2017 in [facility] Office Visit from 03/29/2017 in [facility] Office Visit from 03/21/2017 in [facility] Office Visit from 12/06/2016 in [facility]  PHQ-2 Total Score  2  3  0  1  3  PHQ-9 Total Score  7  18  -  -  13       Assessment and Plan: Kathryn Munoz is a 42 year old African-American female, married, employed, lives in [place], has a history of mood lability, sleep problems, hypothyroidism, presented to the clinic today for a follow-up visit.  Patient is biologically predisposed given her family history of mental health problems, history of trauma.  Patient also has psychosocial stressors of work-related stressors as well as her mother who continues to struggle with medical problems.  Patient continues to work with her psychotherapist which is going well.  Patient is motivated to stay on medications.  Plan as noted below.  Plan Bipolar disorder type II Increase Lamictal to 75 mg p.o. Daily Celexa 40 mg p.o. daily  For OCD Patient will continue psychotherapy/CBT with her therapist on a weekly basis- Dr. Clarisa Kindred Celexa 40 mg p.o. daily  For insomnia Trazodone 25-50 mg p.o. nightly, she reports she does not use it every night.   Follow  up in clinic in 1 month or sooner if needed.  More than 50 % of the time was spent for psychoeducation and supportive psychotherapy and care coordination.  This note was generated in part or whole with voice recognition software. Voice recognition is usually quite accurate but there are transcription errors that can and very often do occur. I apologize for any typographical errors that were not detected and corrected.          Jomarie Longs, MD 12/06/2017, 8:48 AM

## 2017-12-06 ENCOUNTER — Encounter: Payer: Self-pay | Admitting: Psychiatry

## 2017-12-13 ENCOUNTER — Encounter: Payer: BC Managed Care – PPO | Admitting: Obstetrics and Gynecology

## 2017-12-13 ENCOUNTER — Other Ambulatory Visit: Payer: Self-pay | Admitting: Obstetrics and Gynecology

## 2017-12-15 ENCOUNTER — Other Ambulatory Visit: Payer: Self-pay | Admitting: Family Medicine

## 2017-12-17 NOTE — Telephone Encounter (Signed)
Please call- received request for refill of celexa- should be receiving this from psychiatry now. Please let us know if there are any issues or needs refill for time being.

## 2017-12-17 NOTE — Telephone Encounter (Signed)
Patient called.  Unable to reach patient. If patient calls back please inform her that we received her refill request and that her Psychiatrist should be prescribing this for her now.  Please let us know if there are any issues or needs refill for time being per Sharyon Cable, NP.

## 2017-12-19 ENCOUNTER — Ambulatory Visit (INDEPENDENT_AMBULATORY_CARE_PROVIDER_SITE_OTHER): Payer: BC Managed Care – PPO | Admitting: Obstetrics and Gynecology

## 2017-12-19 ENCOUNTER — Encounter: Payer: Self-pay | Admitting: Obstetrics and Gynecology

## 2017-12-19 ENCOUNTER — Other Ambulatory Visit (HOSPITAL_COMMUNITY)
Admission: RE | Admit: 2017-12-19 | Discharge: 2017-12-19 | Disposition: A | Discharge status: Home / Self Care | Payer: BC Managed Care – PPO | Source: Ambulatory Visit | Attending: Obstetrics and Gynecology | Admitting: Obstetrics and Gynecology

## 2017-12-19 VITALS — BP 95/62 | HR 87 | Ht 65.5 in | Wt 248.6 lb

## 2017-12-19 DIAGNOSIS — N898 Other specified noninflammatory disorders of vagina: Secondary | ICD-10-CM | POA: Insufficient documentation

## 2017-12-19 DIAGNOSIS — B373 Candidiasis of vulva and vagina: Secondary | ICD-10-CM | POA: Diagnosis not present

## 2017-12-19 DIAGNOSIS — M793 Panniculitis, unspecified: Secondary | ICD-10-CM

## 2017-12-19 DIAGNOSIS — E8881 Metabolic syndrome: Secondary | ICD-10-CM

## 2017-12-19 DIAGNOSIS — B379 Candidiasis, unspecified: Secondary | ICD-10-CM

## 2017-12-19 DIAGNOSIS — Z6841 Body Mass Index (BMI) 40.0 and over, adult: Secondary | ICD-10-CM

## 2017-12-19 DIAGNOSIS — B3731 Acute candidiasis of vulva and vagina: Secondary | ICD-10-CM

## 2017-12-19 NOTE — Progress Notes (Signed)
GYN ENCOUNTER NOTE  Subjective:       Kathryn Munoz is a 42 y.o. G60P1102 female is here for gynecologic evaluation of the following issues:  1.  Skin rash 2.  History of abnormal uterine bleeding  Triston presents today for follow-up.  She has persistent sub-panniculus skin rash that has worsened recently.  She has been using nystatin powder topically which has become ineffective.  She is out of the medication.  Previously she was treated with Mycolog cream for yeast infections.  She does have insulin resistance and is on metformin.  Patient reports also minimal vaginal discharge.  There is no vulvar irritation/itching.  Patient denies any new sex partners recently.  Patient is on NuvaRing with evidence of irregular menstrual cycles.  Previously she was amenorrheic.  Bleeding is monthly and lasting 5 to 6 days.  She is not experiencing any dysmenorrhea.   Gynecologic History Patient's last menstrual period was 12/13/2017. Contraception: NuvaRing  Obstetric History OB History  Gravida Para Term Preterm AB Living  2 2 1 1   2   SAB TAB Ectopic Multiple Live Births          2    # Outcome Date GA Lbr Len/2nd Weight Sex Delivery Anes PTL Lv  2 Term 2008   8 lb 12.8 oz (3.992 kg) M Vag-Spont   LIV  1 Preterm 2001   4 lb (1.814 kg) M Vag-Spont   LIV     Complications: GDM (gestational diabetes mellitus)    Past Medical History:  Diagnosis Date  . Abnormal mammogram    breast biopsy, PASH 2014  . Anxiety   . Bipolar 2 disorder (HCC)   . Depression   . Dermatitis   . History of gestational diabetes   . History of kidney stones 2014  . Hypothyroidism   . IFG (impaired fasting glucose)   . Insulin resistance   . Irregular periods   . Neuropathy   . Obesity   . OCD (obsessive compulsive disorder)   . Vitamin D deficiency disease     Past Surgical History:  Procedure Laterality Date  . BREAST BIOPSY Left 2014   [place] bx/clip-neg  . INTRAUTERINE DEVICE INSERTION  03/11/2012     Current Outpatient Medications on File Prior to Visit  Medication Sig Dispense Refill  . Cholecalciferol (VITAMIN D3) 1000 units CAPS Take 1,000 Int'l Units by mouth daily.     . citalopram (CELEXA) 40 MG tablet Take 1 tablet (40 mg total) by mouth daily. 30 tablet 3  . EPINEPHrine 0.3 mg/0.3 mL IJ SOAJ injection INJECT 0.3 MLS INTO THE MUSCLE ONCE FOR 1 DOSE. FOR LIFE-THREATENING ALLERGIC REACTION / ANAPHYLAXIS  1  . lamoTRIgine (LAMICTAL) 25 MG tablet Take 2 tablets (50 mg total) by mouth daily. 60 tablet 0  . levothyroxine (SYNTHROID, LEVOTHROID) 50 MCG tablet TAKE 1 TABLET (50 MCG TOTAL) BY MOUTH DAILY BEFORE BREAKFAST. 90 tablet 1  . metFORMIN (GLUCOPHAGE XR) 500 MG 24 hr tablet One by mouth daily for one week, then two daily 173 tablet 0  . Multiple Vitamins-Minerals (MULTIVITAMIN WITH MINERALS) tablet Take 1 tablet by mouth daily.    Marland Kitchen NUVARING 0.12-0.015 MG/24HR vaginal ring INSERT 1 RING VAGINALLY AS DIRECTED. REMOVE AFTER 3 WEEKS & WAIT 7 DAYS BEFORE INSERTING A NEW RING 3 each 0  . polyethylene glycol (MIRALAX / GLYCOLAX) packet Take 17 g by mouth daily.    . traZODone (DESYREL) 50 MG tablet TAKE 0.5-1 TABLETS (25-50 MG TOTAL) BY  MOUTH AT BEDTIME. FOR SLEEP 90 tablet 1   No current facility-administered medications on file prior to visit.     Allergies  Allergen Reactions  . Date Seed Extract  [Zizyphus Jujuba] Anaphylaxis, Cough, Itching and Swelling    Social History   Socioeconomic History  . Marital status: Married    Spouse name: jeffrey  . Number of children: 2  . Years of education: Not on file  . Highest education level: Bachelor's degree (e.g., BA, AB, BS)  Occupational History  . Not on file  Social Needs  . Financial resource strain: Not hard at all  . Food insecurity:    Worry: Never true    Inability: Never true  . Transportation needs:    Medical: No    Non-medical: No  Tobacco Use  . Smoking status: Never Smoker  . Smokeless tobacco: Never Used   Substance and Sexual Activity  . Alcohol use: No  . Drug use: No  . Sexual activity: Yes    Partners: Male    Birth control/protection: Other-see comments    Comment: nuvaring  Lifestyle  . Physical activity:    Days per week: 3 days    Minutes per session: 60 min  . Stress: Very much  Relationships  . Social connections:    Talks on phone: Never    Gets together: Once a week    Attends religious service: More than 4 times per year    Active member of club or organization: Yes    Attends meetings of clubs or organizations: More than 4 times per year    Relationship status: Married  . Intimate partner violence:    Fear of current or ex partner: No    Emotionally abused: No    Physically abused: No    Forced sexual activity: No  Other Topics Concern  . Not on file  Social History Narrative  . Not on file    Family History  Problem Relation Age of Onset  . Diabetes Mother   . Pancreatitis Mother   . Hypertension Mother   . Alcohol abuse Mother   . Schizophrenia Mother   . Depression Mother   . Diabetes Sister        pre-diabetic  . Diabetes Brother   . Drug abuse Brother   . ADD / ADHD Son   . Eczema Son   . Breast cancer Cousin        pat cousin  . Anxiety disorder Other   . Ovarian cancer Neg Hx     The following portions of the patient's history were reviewed and updated as appropriate: allergies, current medications, past family history, past medical history, past social history, past surgical history and problem list.  Review of Systems Review of Systems  Constitutional: Negative for chills, fever and weight loss.  Respiratory: Negative.   Cardiovascular: Negative.   Gastrointestinal: Positive for abdominal pain. Negative for constipation and diarrhea.  Genitourinary: Negative for dysuria, frequency and urgency.  Skin: Positive for itching and rash.       Sub-panniculus  Neurological: Negative.   Endo/Heme/Allergies: Negative.    Psychiatric/Behavioral: Negative.      Objective:   BP 95/62   Pulse 87   Ht 5' 5.5" (1.664 m)   Wt 248 lb 9.6 oz (112.8 kg)   LMP 12/13/2017   BMI 40.74 kg/m  CONSTITUTIONAL: Well-developed, well-nourished female in no acute distress.  HENT:  Normocephalic, atraumatic.  NECK: Not examined SKIN: Skin is warm and  dry. Not diaphoretic. No erythema. No pallor.  Abdominal wall rash with erythema, satellite lesions, increased pigmentation change, epithelial skin breakdown in the sub-panniculus region and upper inguinal region consistent with monilia NEUROLGIC: Alert and oriented to person, place, and time.  PSYCHIATRIC: Normal mood and affect. Normal behavior. Normal judgment and thought content. CARDIOVASCULAR:Not Examined RESPIRATORY: Not Examined BREASTS: Not Examined ABDOMEN: Soft, non distended; Non tender.  No Organomegaly.  Skin rash as noted above PELVIC:  External Genitalia: Normal  BUS: Normal  Vagina: Thick yellow/white discharge  Cervix: Normal; no discharge  Uterus: Not examined  Adnexa: Not examined  RV: Normal external exam  Bladder: Nontender MUSCULOSKELETAL: Normal range of motion. No tenderness.  No cyanosis, clubbing, or edema.   PROCEDURE: Wet prep Normal saline-negative clue cells, negative white blood cells, negative trichomonas KOH-yeast present (negative whiff)  Assessment:   1. Vaginal discharge - Cervicovaginal ancillary only  2. Monilial vaginitis  3. Monilial panniculitis  4. Morbid obesity with BMI of 40.0-44.9, adult (HCC)  5. Insulin resistance     Plan:   1.  Diflucan 150 mg orally weekly x4 2.  Mycolog cream topically twice daily x4 weeks 3.  Continue with metformin therapy, exercise, weight loss, and control dietary 4.  Return in 4 weeks for follow-up  A total of 15 minutes were spent face-to-face with the patient during this encounter and over half of that time dealt with counseling and coordination of care.  Herold Harms, MD  Note: This dictation was prepared with Dragon dictation along with smaller phrase technology. Any transcriptional errors that result from this process are unintentional.

## 2017-12-19 NOTE — Patient Instructions (Addendum)
1.  Wet prep today is consistent with monilia infection. 2.  Diflucan 150 mg orally every week x4 3.  Nystatin/triamcinolone cream topically to the panniculus skin rash twice a day x4 weeks 4.  Return in 4 weeks for follow-up 5.  Recommend optimizing blood sugar control with metformin, dietary intake, exercise, weight loss.   Vaginal Yeast infection, Adult Vaginal yeast infection is a condition that causes soreness, swelling, and redness (inflammation) of the vagina. It also causes vaginal discharge. This is a common condition. Some women get this infection frequently. What are the causes? This condition is caused by a change in the normal balance of the yeast (candida) and bacteria that live in the vagina. This change causes an overgrowth of yeast, which causes the inflammation. What increases the risk? This condition is more likely to develop in:  Women who take antibiotic medicines.  Women who have diabetes.  Women who take birth control pills.  Women who are pregnant.  Women who douche often.  Women who have a weak defense (immune) system.  Women who have been taking steroid medicines for a long time.  Women who frequently wear tight clothing.  What are the signs or symptoms? Symptoms of this condition include:  White, thick vaginal discharge.  Swelling, itching, redness, and irritation of the vagina. The lips of the vagina (vulva) may be affected as well.  Pain or a burning feeling while urinating.  Pain during sex.  How is this diagnosed? This condition is diagnosed with a medical history and physical exam. This will include a pelvic exam. Your health care provider will examine a sample of your vaginal discharge under a microscope. Your health care provider may send this sample for testing to confirm the diagnosis. How is this treated? This condition is treated with medicine. Medicines may be over-the-counter or prescription. You may be told to use one or more of the  following:  Medicine that is taken orally.  Medicine that is applied as a cream.  Medicine that is inserted directly into the vagina (suppository).  Follow these instructions at home:  Take or apply over-the-counter and prescription medicines only as told by your health care provider.  Do not have sex until your health care provider has approved. Tell your sex partner that you have a yeast infection. That person should go to his or her health care provider if he or she develops symptoms.  Do not wear tight clothes, such as pantyhose or tight pants.  Avoid using tampons until your health care provider approves.  Eat more yogurt. This may help to keep your yeast infection from returning.  Try taking a sitz bath to help with discomfort. This is a warm water bath that is taken while you are sitting down. The water should only come up to your hips and should cover your buttocks. Do this 3-4 times per day or as told by your health care provider.  Do not douche.  Wear breathable, cotton underwear.  If you have diabetes, keep your blood sugar levels under control. Contact a health care provider if:  You have a fever.  Your symptoms go away and then return.  Your symptoms do not get better with treatment.  Your symptoms get worse.  You have new symptoms.  You develop blisters in or around your vagina.  You have blood coming from your vagina and it is not your menstrual period.  You develop pain in your abdomen. This information is not intended to replace advice given  to you by your health care provider. Make sure you discuss any questions you have with your health care provider. Document Released: 12/14/2004 Document Revised: 08/18/2015 Document Reviewed: 09/07/2014 Elsevier Interactive Patient Education  2018 [employer].

## 2017-12-20 MED ORDER — NYSTATIN-TRIAMCINOLONE 100000-0.1 UNIT/GM-% EX OINT: 1.0000 "application " | TOPICAL_OINTMENT | Freq: Two times a day (BID) | CUTANEOUS | 2 refills | Status: DC

## 2017-12-20 MED ORDER — FLUCONAZOLE 150 MG PO TABS: 150.0000 mg | ORAL_TABLET | ORAL | 0 refills | Status: DC

## 2017-12-20 NOTE — Addendum Note (Signed)
Addended by: Marchelle Folks on: 12/20/2017 04:54 PM   Modules accepted: Orders

## 2017-12-21 LAB — CERVICOVAGINAL ANCILLARY ONLY: Candida vaginitis: NEGATIVE

## 2018-01-10 ENCOUNTER — Other Ambulatory Visit: Payer: Self-pay | Admitting: Psychiatry

## 2018-01-10 DIAGNOSIS — F3181 Bipolar II disorder: Secondary | ICD-10-CM

## 2018-01-15 ENCOUNTER — Ambulatory Visit (INDEPENDENT_AMBULATORY_CARE_PROVIDER_SITE_OTHER): Payer: BC Managed Care – PPO | Admitting: Psychiatry

## 2018-01-15 ENCOUNTER — Encounter: Payer: Self-pay | Admitting: Psychiatry

## 2018-01-15 ENCOUNTER — Other Ambulatory Visit: Payer: Self-pay

## 2018-01-15 VITALS — BP 120/80 | HR 85 | Temp 99.3°F | Wt 247.2 lb

## 2018-01-15 DIAGNOSIS — F5105 Insomnia due to other mental disorder: Secondary | ICD-10-CM

## 2018-01-15 DIAGNOSIS — F429 Obsessive-compulsive disorder, unspecified: Secondary | ICD-10-CM

## 2018-01-15 DIAGNOSIS — F3181 Bipolar II disorder: Secondary | ICD-10-CM

## 2018-01-15 MED ORDER — CITALOPRAM HYDROBROMIDE 40 MG PO TABS: 40.0000 mg | ORAL_TABLET | Freq: Every day | ORAL | 1 refills | Status: DC

## 2018-01-15 NOTE — Progress Notes (Signed)
BH MD OP Progress Note  01/15/2018 4:41 PM KIMBLEY SPRAGUE  MRN:  [id number]  Chief Complaint:  Chief Complaint    Follow-up; Medication Refill    ' I am here for follow up." HPI: Kathryn Munoz is a 42 year old African-American female, married, lives in [place], has a history of mood lability, anxiety, insulin resistance, hypothyroidism, presented to the clinic today for a follow-up visit.  Patient today reports her mood symptoms as improving.  She denies any significant mood lability, irritability or anxiety symptoms.  She reports she is tolerating the medications well.  She denies any significant side effects to the medications.  She is compliant on the Celexa and Lamictal.  She reports she wants to stay on the same dosage now.  Patient reports she has started taking the trazodone more often and it seems to be helpful.  Sleep has improved a lot.  She wants to continue the same dosage.  She denies side effects to trazodone.  She reports she has been able to cope with her OCD symptoms and has not noticed any obsessive thoughts recently.  She works with her psychotherapist in [place] which is going well.  She sees her every 2 weeks now.  She reports overall she is doing well.  She did have a fungal infection recently and was treated for the same.  She reports she feels much better and her rash is resolving.    Visit Diagnosis:    ICD-10-CM   1. Bipolar 2 disorder, major depressive episode (HCC) F31.81    improving  2. 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 F42.9    improving  3. Insomnia due to mental condition F51.05    improving    Past Psychiatric History: Reviewed past psychiatric history from my progress note on 11/08/2017.  Past trials of Celexa.  Patient continues to be in psychotherapy with Dr. Clarisa Kindred in [place].  Past Medical History:  Past Medical History:  Diagnosis Date  . Abnormal mammogram    breast biopsy, PASH 2014  . Anxiety   . Bipolar 2  disorder (HCC)   . Depression   . Dermatitis   . History of gestational diabetes   . History of kidney stones 2014  . Hypothyroidism   . IFG (impaired fasting glucose)   . Insulin resistance   . Irregular periods   . Neuropathy   . Obesity   . OCD (obsessive compulsive disorder)   . Vitamin D deficiency disease     Past Surgical History:  Procedure Laterality Date  . BREAST BIOPSY Left 2014   [place] bx/clip-neg  . INTRAUTERINE DEVICE INSERTION  03/11/2012    Family Psychiatric History: Have reviewed family psychiatric history from my progress note on 11/08/2017  Family History:  Family History  Problem Relation Age of Onset  . Diabetes Mother   . Pancreatitis Mother   . Hypertension Mother   . Alcohol abuse Mother   . Schizophrenia Mother   . Depression Mother   . Diabetes Sister        pre-diabetic  . Diabetes Brother   . Drug abuse Brother   . ADD / ADHD Son   . Eczema Son   . Breast cancer Cousin        pat cousin  . Anxiety disorder Other   . Ovarian cancer Neg Hx     Social History: Reviewed social history from my progress note on 11/08/2017. Social History   Socioeconomic History  . Marital status: Married  Spouse name: jeffrey  . Number of children: 2  . Years of education: Not on file  . Highest education level: Bachelor's degree (e.g., BA, AB, BS)  Occupational History  . Not on file  Social Needs  . Financial resource strain: Not hard at all  . Food insecurity:    Worry: Never true    Inability: Never true  . Transportation needs:    Medical: No    Non-medical: No  Tobacco Use  . Smoking status: Never Smoker  . Smokeless tobacco: Never Used  Substance and Sexual Activity  . Alcohol use: No  . Drug use: No  . Sexual activity: Yes    Partners: Male    Birth control/protection: Other-see comments    Comment: nuvaring  Lifestyle  . Physical activity:    Days per week: 3 days    Minutes per session: 60 min  . Stress: Very much   Relationships  . Social connections:    Talks on phone: Never    Gets together: Once a week    Attends religious service: More than 4 times per year    Active member of club or organization: Yes    Attends meetings of clubs or organizations: More than 4 times per year    Relationship status: Married  Other Topics Concern  . Not on file  Social History Narrative  . Not on file    Allergies:  Allergies  Allergen Reactions  . Date Seed Extract  [Zizyphus Jujuba] Anaphylaxis, Cough, Itching and Swelling    Metabolic Disorder Labs: Lab Results  Component Value Date   HGBA1C 5.5 10/22/2017   MPG 111 10/22/2017   MPG 88 10/11/2015   No results found for: PROLACTIN Lab Results  Component Value Date   CHOL 136 10/22/2017   TRIG 93 10/22/2017   HDL 60 10/22/2017   CHOLHDL 2.3 10/22/2017   VLDL 14 10/16/2016   LDLCALC 58 10/22/2017   LDLCALC 76 10/16/2016   Lab Results  Component Value Date   TSH 3.42 10/22/2017   TSH 3.89 03/21/2017    Therapeutic Level Labs: No results found for: LITHIUM No results found for: VALPROATE No components found for:  CBMZ  Current Medications: Current Outpatient Medications  Medication Sig Dispense Refill  . Cholecalciferol (VITAMIN D3) 1000 units CAPS Take 1,000 Int'l Units by mouth daily.     . citalopram (CELEXA) 40 MG tablet Take 1 tablet (40 mg total) by mouth daily. 90 tablet 1  . EPINEPHrine 0.3 mg/0.3 mL IJ SOAJ injection INJECT 0.3 MLS INTO THE MUSCLE ONCE FOR 1 DOSE. FOR LIFE-THREATENING ALLERGIC REACTION / ANAPHYLAXIS  1  . fluconazole (DIFLUCAN) 150 MG tablet Take 1 tablet (150 mg total) by mouth once a week. 4 tablet 0  . lamoTRIgine (LAMICTAL) 25 MG tablet TAKE 3 TABLETS BY MOUTH DAILY 90 tablet 1  . levothyroxine (SYNTHROID, LEVOTHROID) 50 MCG tablet TAKE 1 TABLET (50 MCG TOTAL) BY MOUTH DAILY BEFORE BREAKFAST. 90 tablet 1  . metFORMIN (GLUCOPHAGE XR) 500 MG 24 hr tablet One by mouth daily for one week, then two daily 173  tablet 0  . Multiple Vitamins-Minerals (MULTIVITAMIN WITH MINERALS) tablet Take 1 tablet by mouth daily.    Marland Kitchen NUVARING 0.12-0.015 MG/24HR vaginal ring INSERT 1 RING VAGINALLY AS DIRECTED. REMOVE AFTER 3 WEEKS & WAIT 7 DAYS BEFORE INSERTING A NEW RING 3 each 0  . nystatin-triamcinolone ointment (MYCOLOG) Apply 1 application topically 2 (two) times daily. X 14 days 30 g 2  .  polyethylene glycol (MIRALAX / GLYCOLAX) packet Take 17 g by mouth daily.    . traZODone (DESYREL) 50 MG tablet TAKE 0.5-1 TABLETS (25-50 MG TOTAL) BY MOUTH AT BEDTIME. FOR SLEEP 90 tablet 1   No current facility-administered medications for this visit.      Musculoskeletal: Strength & Muscle Tone: within normal limits Gait & Station: normal Patient leans: N/A  Psychiatric Specialty Exam: Review of Systems  Psychiatric/Behavioral: Positive for depression (improving).  All other systems reviewed and are negative.   Blood pressure 120/80, pulse 85, temperature 99.3 F (37.4 C), temperature source Oral, weight 247 lb 3.2 oz (112.1 kg).Body mass index is 40.51 kg/m.  General Appearance: Casual  Eye Contact:  Fair  Speech:  Clear and Coherent  Volume:  Normal  Mood:  Euthymic  Affect:  Congruent  Thought Process:  Goal Directed and Descriptions of Associations: Intact  Orientation:  Full (Time, Place, and Person)  Thought Content: Logical   Suicidal Thoughts:  No  Homicidal Thoughts:  No  Memory:  Immediate;   Fair Recent;   Fair Remote;   Fair  Judgement:  Fair  Insight:  Fair  Psychomotor Activity:  Normal  Concentration:  Concentration: Fair and Attention Span: Fair  Recall:  [name] of Knowledge: Fair  Language: Fair  Akathisia:  No  Handed:  Right  AIMS (if indicated): na  Assets:  Communication Skills Desire for Improvement Housing Social Support Talents/Skills Transportation  ADL's:  Intact  Cognition: WNL  Sleep:  improved   Screenings: PHQ2-9     Office Visit from 11/08/2017 in [facility] Office Visit from 10/22/2017 in [facility] Office Visit from 03/29/2017 in [facility] Office Visit from 03/21/2017 in [facility] Office Visit from 12/06/2016 in [facility]  PHQ-2 Total Score  2  3  0  1  3  PHQ-9 Total Score  7  18  -  -  13       Assessment and Plan: Juliany is a 42 year old African-American female, married, employed, lives in [place], has a history of mood lability, sleep problems, hypothyroidism, presented to the clinic today for a follow-up visit.  Patient is biologically predisposed given her family history of mental health problems, history of trauma.  She also has psychosocial stressors of work-related stress as well as her mother who continues to struggle with medical problems.  She is currently making progress on the current medication regimen. She is also in psychotherapy which is going well.  Plan as noted below.  Plan Bipolar disorder type II Lamictal 75 mg p.o. daily. Celexa 40 mg p.o. daily.  For OCD Continue psychotherapy with Dr. Clarisa Kindred. Celexa 40 mg p.o. daily  For insomnia Trazodone 25-50 mg p.o. Nightly.  Follow up in clinic in 2 months or sooner if needed.  More than 50 % of the time was spent for psychoeducation and supportive psychotherapy and care coordination. This note was generated in part or whole with voice recognition software. Voice recognition is usually quite accurate but there are transcription errors that can and very often do occur. I apologize for any typographical errors that were not detected and corrected.       Jomarie Longs, MD 01/16/2018, 12:42 PM

## 2018-01-16 ENCOUNTER — Encounter: Payer: Self-pay | Admitting: Psychiatry

## 2018-01-22 ENCOUNTER — Encounter: Payer: Self-pay | Admitting: Obstetrics and Gynecology

## 2018-01-22 ENCOUNTER — Ambulatory Visit: Payer: BC Managed Care – PPO | Admitting: Obstetrics and Gynecology

## 2018-01-22 VITALS — BP 112/66 | HR 86 | Ht 65.5 in | Wt 244.9 lb

## 2018-01-22 DIAGNOSIS — N939 Abnormal uterine and vaginal bleeding, unspecified: Secondary | ICD-10-CM

## 2018-01-22 DIAGNOSIS — M793 Panniculitis, unspecified: Secondary | ICD-10-CM | POA: Diagnosis not present

## 2018-01-22 DIAGNOSIS — B3731 Acute candidiasis of vulva and vagina: Secondary | ICD-10-CM

## 2018-01-22 DIAGNOSIS — Z6841 Body Mass Index (BMI) 40.0 and over, adult: Secondary | ICD-10-CM

## 2018-01-22 DIAGNOSIS — B373 Candidiasis of vulva and vagina: Secondary | ICD-10-CM

## 2018-01-22 DIAGNOSIS — B379 Candidiasis, unspecified: Secondary | ICD-10-CM

## 2018-01-22 LAB — POCT URINE PREGNANCY: Preg Test, Ur: NEGATIVE

## 2018-01-22 NOTE — Patient Instructions (Signed)
1.  The monilia dermatitis is resolved under the abdominal panniculus and in the groin region 2.  Recommend stopping the Mycolog cream therapy at this time 3.  Strongly encourage glucose control with healthy dieting 4.  Continue follow-up with Dr. Lelon Frohlich for wellness 5.  Return as needed for any gynecologic issues

## 2018-01-22 NOTE — Progress Notes (Signed)
Chief complaint: 1.  Monilia dermatitis 2.  Abnormal uterine bleeding/amenorrhea  Kathryn Munoz presents today for 4-week follow-up.  She was treated aggressively with Diflucan 150 mg orally every week x4 along with Mycolog cream topically twice a day to the panniculus infection and groin infection.  Patient has had resolution of her symptomatology as well as skin changes.  She is not experiencing any significant itching or burning.  Rash is resolved. She reports no significant vaginal discharge or irritation.  Patient did experience an abnormal uterine bleeding episode when she was to have her last menses in October.  She had a day of spotting without regular bleeding.  Concern is for possible pregnancy.  Past Medical History:  Diagnosis Date  . Abnormal mammogram    breast biopsy, PASH 2014  . Anxiety   . Bipolar 2 disorder (HCC)   . Depression   . Dermatitis   . History of gestational diabetes   . History of kidney stones 2014  . Hypothyroidism   . IFG (impaired fasting glucose)   . Insulin resistance   . Irregular periods   . Neuropathy   . Obesity   . OCD (obsessive compulsive disorder)   . Vitamin D deficiency disease    Past Surgical History:  Procedure Laterality Date  . BREAST BIOPSY Left 2014   [place] bx/clip-neg  . INTRAUTERINE DEVICE INSERTION  03/11/2012   Review of Systems  Constitutional: Negative for chills, diaphoresis and fever.  Gastrointestinal: Negative for abdominal pain.  Genitourinary: Negative for dysuria, frequency and urgency.  Skin: Negative for itching and rash.  All other systems reviewed and are negative.  OBJECTIVE: BP 112/66   Pulse 86   Ht 5' 5.5" (1.664 m)   Wt 244 lb 14.4 oz (111.1 kg)   LMP 01/13/2018 (Exact Date)   BMI 40.13 kg/m  Well-appearing female no acute distress.  Alert and oriented. Affect is appropriate. Abdomen: Soft, nontender without organomegaly; panniculus without evidence of rash previously noted Bladder:  Nontender Pelvic: Vulva-normal Vagina-no discharge Extremities: Groin without skin changes/rash  ASSESSMENT: 1.  Monilia dermatitis-sub-panniculus, resolved 2.  Monilia vaginitis-resolved 3.  Abnormal uterine bleeding; negative pregnancy test  PLAN: 1.  Results of testing reviewed 2.  Discontinue Mycolog cream topically to the panniculus and groin 3.  Follow-up as needed for any gynecologic issues 4.  Continue seeing Dr. Lelon Frohlich for routine wellness exams.  A total of 15 minutes were spent face-to-face with the patient during this encounter and over half of that time dealt with counseling and coordination of care.  Herold Harms, MD  Note: This dictation was prepared with Dragon dictation along with smaller phrase technology. Any transcriptional errors that result from this process are unintentional.

## 2018-01-27 ENCOUNTER — Other Ambulatory Visit: Payer: Self-pay | Admitting: Family Medicine

## 2018-01-28 NOTE — Telephone Encounter (Signed)
Reviewed last insulin and Cr Rx approved with new instructions

## 2018-02-05 ENCOUNTER — Other Ambulatory Visit: Payer: Self-pay | Admitting: Psychiatry

## 2018-02-05 DIAGNOSIS — F3181 Bipolar II disorder: Secondary | ICD-10-CM

## 2018-02-08 ENCOUNTER — Ambulatory Visit: Payer: BC Managed Care – PPO | Admitting: Family Medicine

## 2018-02-08 ENCOUNTER — Encounter: Payer: Self-pay | Admitting: Family Medicine

## 2018-02-08 VITALS — BP 118/78 | HR 94 | Temp 98.6°F | Ht 66.0 in | Wt 245.9 lb

## 2018-02-08 DIAGNOSIS — E039 Hypothyroidism, unspecified: Secondary | ICD-10-CM

## 2018-02-08 DIAGNOSIS — R7301 Impaired fasting glucose: Secondary | ICD-10-CM

## 2018-02-08 DIAGNOSIS — E8881 Metabolic syndrome: Secondary | ICD-10-CM

## 2018-02-08 DIAGNOSIS — L83 Acanthosis nigricans: Secondary | ICD-10-CM | POA: Diagnosis not present

## 2018-02-08 DIAGNOSIS — F419 Anxiety disorder, unspecified: Secondary | ICD-10-CM

## 2018-02-08 DIAGNOSIS — F331 Major depressive disorder, recurrent, moderate: Secondary | ICD-10-CM

## 2018-02-08 DIAGNOSIS — E669 Obesity, unspecified: Secondary | ICD-10-CM | POA: Diagnosis not present

## 2018-02-08 DIAGNOSIS — Z6841 Body Mass Index (BMI) 40.0 and over, adult: Secondary | ICD-10-CM

## 2018-02-08 MED ORDER — METFORMIN HCL ER 500 MG PO TB24: 1500.0000 mg | ORAL_TABLET | Freq: Every day | ORAL | 0 refills | Status: DC

## 2018-02-08 NOTE — Progress Notes (Signed)
BP 118/78   Pulse 94   Temp 98.6 F (37 C)   Ht 5\' 6"  (1.676 m)   Wt 245 lb 14.4 oz (111.5 kg)   LMP 01/13/2018 (Exact Date)   SpO2 99%   BMI 39.69 kg/m    Subjective:    Patient ID: Kathryn Munoz, female    DOB: Jun 21, 1975, 42 y.o.   MRN: [id number]  HPI: Kathryn Munoz is a 42 y.o. female  Chief Complaint  Patient presents with  . Follow-up    HPI Here for f/u Hypothyroidism; going on for 3-4 years; not in the family to her knowledge; moving BMs okay, a little constipated at times; no dry skin or hair loss; energy is okay Lab Results  Component Value Date   TSH 3.42 10/22/2017   Insulin resistance; prediabetes; still craving sugars; taking 1000 mg metformin with no side effects   Obesity; doing great with her activity; she does not have a structured exercise regiimen or anything, but got her depression under control, not going to bed when going home; she has her life back  Depression; citalopram 40 mg day and that is working well; no SI/HI  Under the breast; mole she wants checked; one side was black and other side grey; harder than normal  [facility] 02/08/2018 11/08/2017 10/22/2017 03/29/2017 03/21/2017  Decreased Interest 0 1 0 0 0  Down, Depressed, Hopeless 0 1 3 0 1  PHQ - 2 Score 0 2 3 0 1  Altered sleeping 0 1 3 - -  Tired, decreased energy 0 1 3 - -  Change in appetite 0 0 3 - -  Feeling bad or failure about yourself  0 1 3 - -  Trouble concentrating 0 1 3 - -  Moving slowly or fidgety/restless 0 1 0 - -  Suicidal thoughts 0 0 0 - -  PHQ-9 Score 0 7 18 - -  Difficult doing work/chores Not difficult at all Very difficult Very difficult - -   Fall Risk  02/08/2018 11/08/2017 10/22/2017 03/29/2017 03/21/2017  Falls in the past year? 0 No No No No  Number falls in past yr: - - - - -  Injury with Fall? - - - - -    Relevant past medical, surgical, family and social history reviewed Past Medical History:  Diagnosis Date  . Abnormal mammogram      breast biopsy, PASH 2014  . Anxiety   . Bipolar 2 disorder (HCC)   . Depression   . Dermatitis   . History of gestational diabetes   . History of kidney stones 2014  . Hypothyroidism   . IFG (impaired fasting glucose)   . Insulin resistance   . Irregular periods   . Neuropathy   . Obesity   . OCD (obsessive compulsive disorder)   . Vitamin D deficiency disease    Past Surgical History:  Procedure Laterality Date  . BREAST BIOPSY Left 2014   [place] bx/clip-neg  . INTRAUTERINE DEVICE INSERTION  03/11/2012   Family History  Problem Relation Age of Onset  . Diabetes Mother   . Pancreatitis Mother   . Hypertension Mother   . Alcohol abuse Mother   . Schizophrenia Mother   . Depression Mother   . Diabetes Sister        pre-diabetic  . Diabetes Brother   . Drug abuse Brother   . ADD / ADHD Son   . Eczema Son   . Breast cancer Cousin  pat cousin  . Anxiety disorder Other   . Ovarian cancer Neg Hx    Social History   Tobacco Use  . Smoking status: Never Smoker  . Smokeless tobacco: Never Used  Substance Use Topics  . Alcohol use: No  . Drug use: No     [facility]/08/2018 [facility]  AUDIT-C Score  0      Interim medical history since last visit reviewed. Allergies and medications reviewed  Review of Systems Per HPI unless specifically indicated above     Objective:    BP 118/78   Pulse 94   Temp 98.6 F (37 C)   Ht 5\' 6"  (1.676 m)   Wt 245 lb 14.4 oz (111.5 kg)   LMP 01/13/2018 (Exact Date)   SpO2 99%   BMI 39.69 kg/m   Wt Readings from Last 3 Encounters:  02/08/18 245 lb 14.4 oz (111.5 kg)  01/22/18 244 lb 14.4 oz (111.1 kg)  12/19/17 248 lb 9.6 oz (112.8 kg)    Physical Exam  Constitutional: She appears well-developed and well-nourished.  HENT:  Mouth/Throat: Mucous membranes are normal.  Eyes: EOM are normal. No scleral icterus.  Cardiovascular: Normal rate and regular rhythm.  Pulmonary/Chest:  Effort normal and breath sounds normal.  Skin:  Acanthosis nigricans nape of neck; birthmark left side back; three skin tag, acrochordons without feathering or worrisome changes  Psychiatric: She has a normal mood and affect. Her behavior is normal. Her mood appears not anxious. She does not exhibit a depressed mood.    Results for orders placed or performed in visit on 01/22/18  POCT urine pregnancy  Result Value Ref Range   Preg Test, Ur Negative Negative      Assessment & Plan:   Problem List Items Addressed This Visit      Endocrine   Insulin resistance   Relevant Orders   Insulin, Free (Bioactive)   IFG (impaired fasting glucose)   Relevant Orders   Basic metabolic panel   Insulin, Free (Bioactive)   Hypothyroidism    Last tsh reviewed; seems stable; check yearly        Musculoskeletal and Integument   Acanthosis nigricans    Increase metformin; stay active        Other   Obesity (BMI 35.0-39.9 without comorbidity)    She is active and working out      Relevant Medications   metFORMIN (GLUCOPHAGE-XR) 500 MG 24 hr tablet   RESOLVED: Morbid obesity with BMI of 40.0-44.9, adult (HCC) - Primary   Relevant Medications   metFORMIN (GLUCOPHAGE-XR) 500 MG 24 hr tablet   Moderate episode of recurrent major depressive disorder (HCC)    Improved with medicine      Anxiety    Being treated; seeing psychiatrist and therapist in [place]          Follow up plan: No follow-ups on file.  An after-visit summary was printed and given to the patient at check-out.  Please see the patient instructions which may contain other information and recommendations beyond what is mentioned above in the assessment and plan.  Meds ordered this encounter  Medications  . metFORMIN (GLUCOPHAGE-XR) 500 MG 24 hr tablet    Sig: Take 3 tablets (1,500 mg total) by mouth daily.    Dispense:  270 tablet    Refill:  0    Orders Placed This Encounter  Procedures  . Basic metabolic  panel  . Insulin, Free (Bioactive)

## 2018-02-08 NOTE — Assessment & Plan Note (Signed)
Being treated; [place]

## 2018-02-08 NOTE — Assessment & Plan Note (Signed)
Last tsh reviewed; seems stable; check yearly

## 2018-02-08 NOTE — Assessment & Plan Note (Signed)
She is active and working out

## 2018-02-08 NOTE — Patient Instructions (Addendum)
We'll get labs today If you have not heard anything from my staff in a week about any orders/referrals/studies from today, please contact us here to follow-up [phone] Check out the information at familydoctor.org entitled "Nutrition for Weight Loss: What You Need to Know about Fad Diets" Try to lose between 1-2 pounds per week by taking in fewer calories and burning off more calories You can succeed by limiting portions, limiting foods dense in calories and fat, becoming more active, and drinking 8 glasses of water a day (64 ounces) Don't skip meals, especially breakfast, as skipping meals may alter your metabolism Do not use over-the-counter weight loss pills or gimmicks that claim rapid weight loss A healthy BMI (or body mass index) is between 18.5 and 24.9 You can calculate your ideal BMI at the NIH website JobEconomics.huhttp://www.nhlbi.nih.gov/health/educational/lose_wt/BMI/bmicalc.htm  Preventing Unhealthy Weight Gain, Adult Staying at a healthy weight is important. When fat builds up in your body, you may become overweight or obese. These conditions put you at greater risk for developing certain health problems, such as heart disease, diabetes, sleeping problems, joint problems, and some cancers. Unhealthy weight gain is often the result of making unhealthy choices in what you eat. It is also a result of not getting enough exercise. You can make changes to your lifestyle to prevent obesity and stay as healthy as possible. What nutrition changes can be made? To maintain a healthy weight and prevent obesity:  Eat only as much as your body needs. To do this: ? Pay attention to signs that you are hungry or full. Stop eating as soon as you feel full. ? If you feel hungry, try drinking water first. Drink enough water so your urine is clear or pale yellow. ? Eat smaller portions. ? Look at serving sizes on food labels. Most foods contain more than one serving per container. ? Eat the recommended amount  of calories for your gender and activity level. While most active people should eat around 2,000 calories per day, if you are trying to lose weight or are not very active, you main need to eat less calories. Talk to your health care provider or dietitian about how many calories you should eat each day.  Choose healthy foods, such as: ? Fruits and vegetables. Try to fill at least half of your plate at each meal with fruits and vegetables. ? Whole grains, such as whole wheat bread, brown rice, and quinoa. ? Lean meats, such as chicken or fish. ? Other healthy proteins, such as beans, eggs, or tofu. ? Healthy fats, such as nuts, seeds, fatty fish, and olive oil. ? Low-fat or fat-free dairy.  Check food labels and avoid food and drinks that: ? Are high in calories. ? Have added sugar. ? Are high in sodium. ? Have saturated fats or trans fats.  Limit how much you eat of the following foods: ? Prepackaged meals. ? Fast food. ? Fried foods. ? Processed meat, such as bacon, sausage, and deli meats. ? Fatty cuts of red meat and poultry with skin.  Cook foods in healthier ways, such as by baking, broiling, or grilling.  When grocery shopping, try to shop around the outside of the store. This helps you buy mostly fresh foods and avoid canned and prepackaged foods.  What lifestyle changes can be made?  Exercise at least 30 minutes 5 or more days each week. Exercising includes brisk walking, yard work, biking, running, swimming, and team sports like basketball and soccer. Ask your health care  provider which exercises are safe for you.  Do not use any products that contain nicotine or tobacco, such as cigarettes and e-cigarettes. If you need help quitting, ask your health care provider.  Limit alcohol intake to no more than 1 drink a day for nonpregnant women and 2 drinks a day for men. One drink equals 12 oz of beer, 5 oz of wine, or 1 oz of hard liquor.  Try to get 7-9 hours of sleep each  night. What other changes can be made?  Keep a food and activity journal to keep track of: ? What you ate and how many calories you had. Remember to count sauces, dressings, and side dishes. ? Whether you were active, and what exercises you did. ? Your calorie, weight, and activity goals.  Check your weight regularly. Track any changes. If you notice you have gained weight, make changes to your diet or activity routine.  Avoid taking weight-loss medicines or supplements. Talk to your health care provider before starting any new medicine or supplement.  Talk to your health care provider before trying any new diet or exercise plan. Why are these changes important? Eating healthy, staying active, and having healthy habits not only help prevent obesity, they also:  Help you to manage stress and emotions.  Help you to connect with friends and family.  Improve your self-esteem.  Improve your sleep.  Prevent long-term health problems.  What can happen if changes are not made? Being obese or overweight can cause you to develop joint or bone problems, which can make it hard for you to stay active or do activities you enjoy. Being obese or overweight also puts stress on your heart and lungs and can lead to health problems like diabetes, heart disease, and some cancers. Where to find more information: Talk with your health care provider or a dietitian about healthy eating and healthy lifestyle choices. You may also find other information through these resources:  U.S. Department of Agriculture MyPlate: https://ball-collins.biz/  American Heart Association: www.heart.org  Centers for Disease Control and Prevention: FootballExhibition.com.br  Summary  Staying at a healthy weight is important. It helps prevent certain diseases and health problems, such as heart disease, diabetes, joint problems, sleep disorders, and some cancers.  Being obese or overweight can cause you to develop joint or bone problems,  which can make it hard for you to stay active or do activities you enjoy.  You can prevent unhealthy weight gain by eating a healthy diet, exercising regularly, not smoking, limiting alcohol, and getting enough sleep.  Talk with your health care provider or a dietitian for guidance about healthy eating and healthy lifestyle choices. This information is not intended to replace advice given to you by your health care provider. Make sure you discuss any questions you have with your health care provider. Document Released: 03/07/2016 Document Revised: 04/12/2016 Document Reviewed: 04/12/2016 Elsevier Interactive Patient Education  [employer].

## 2018-02-08 NOTE — Assessment & Plan Note (Signed)
Improved with medicine

## 2018-02-08 NOTE — Assessment & Plan Note (Signed)
Increase metformin; stay active

## 2018-02-20 LAB — BASIC METABOLIC PANEL
BUN: 9 mg/dL (ref 7–25)
CALCIUM: 9.5 mg/dL (ref 8.6–10.2)
CHLORIDE: 105 mmol/L (ref 98–110)
CO2: 27 mmol/L (ref 20–32)
Creat: 0.92 mg/dL (ref 0.50–1.10)
Glucose, Bld: 96 mg/dL (ref 65–99)
POTASSIUM: 4.3 mmol/L (ref 3.5–5.3)
Sodium: 139 mmol/L (ref 135–146)

## 2018-02-20 LAB — INSULIN, FREE (BIOACTIVE): INSULIN, FREE: 22.3 u[IU]/mL — AB (ref 1.5–14.9)

## 2018-03-05 ENCOUNTER — Other Ambulatory Visit: Payer: Self-pay | Admitting: Obstetrics and Gynecology

## 2018-03-19 ENCOUNTER — Encounter: Payer: Self-pay | Admitting: Psychiatry

## 2018-03-19 ENCOUNTER — Ambulatory Visit: Payer: BC Managed Care – PPO | Admitting: Psychiatry

## 2018-03-19 VITALS — BP 120/79 | HR 86 | Ht 66.0 in | Wt 251.2 lb

## 2018-03-19 DIAGNOSIS — F3181 Bipolar II disorder: Secondary | ICD-10-CM | POA: Diagnosis not present

## 2018-03-19 DIAGNOSIS — F5105 Insomnia due to other mental disorder: Secondary | ICD-10-CM | POA: Diagnosis not present

## 2018-03-19 DIAGNOSIS — F429 Obsessive-compulsive disorder, unspecified: Secondary | ICD-10-CM | POA: Diagnosis not present

## 2018-03-19 MED ORDER — LAMOTRIGINE 25 MG PO TABS: 75.0000 mg | ORAL_TABLET | Freq: Every day | ORAL | 0 refills | Status: DC

## 2018-03-19 MED ORDER — TRAZODONE HCL 50 MG PO TABS: 75.0000 mg | ORAL_TABLET | Freq: Every evening | ORAL | 1 refills | Status: DC | PRN

## 2018-03-19 NOTE — Progress Notes (Addendum)
BH MD  OP Progress Note  03/19/2018 4:54 PM CEAZIA HARB  MRN:  [id number]  Chief Complaint: ' I am here for follow up.' Chief Complaint    Follow-up     HPI: Kathryn Munoz is a 42 year old African-American female, married, lives in [place], has a history of bipolar disorder type II, OCD, insomnia,, insomnia, insulin resistance, hypothyroidism, presented to the clinic today for a follow-up visit.  Patient today reports her mood symptoms as improving on the current medication regimen.  She is compliant on her medications as prescribed.  She denies any side effects to the Lamictal.  She currently struggles with sleep.  She reports she relocated to a new home that she bought recently.  She reports she is still unpacking and getting used to the new place.  She also has been struggling with some carpal tunnel especially on her left wrist.  She currently wears a splint.  She reports that could also be contributing to her sleep problems.  She is currently on trazodone 25 to 50 mg as needed.  Discussed with her her dosage can be increased.  She can start taking 75 to 100 mg at bedtime.  Patient continues to follow-up with her therapist on a regular basis.  She has upcoming appointment on Thursday.  Patient denies any suicidality, homicidality or perceptual disturbances. Visit Diagnosis:    ICD-10-CM   1. Bipolar 2 disorder, major depressive episode (HCC) F31.81 lamoTRIgine (LAMICTAL) 25 MG tablet  2. Insomnia due to mental condition F51.05 traZODone (DESYREL) 50 MG tablet  3. 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 F42.9     Past Psychiatric History: I have reviewed past psychiatric history from my progress note on 11/08/2017.  Past trials of Celexa.  She continues to be in psychotherapy with Dr. Clarisa Kindred in [place].  Past Medical History:  Past Medical History:  Diagnosis Date  . Abnormal mammogram    breast biopsy, PASH 2014  . Anxiety   . Bipolar 2 disorder (HCC)   .  Depression   . Dermatitis   . History of gestational diabetes   . History of kidney stones 2014  . Hypothyroidism   . IFG (impaired fasting glucose)   . Insulin resistance   . Irregular periods   . Neuropathy   . Obesity   . OCD (obsessive compulsive disorder)   . Vitamin D deficiency disease     Past Surgical History:  Procedure Laterality Date  . BREAST BIOPSY Left 2014   [place] bx/clip-neg  . INTRAUTERINE DEVICE INSERTION  03/11/2012    Family Psychiatric History: I have reviewed family psychiatric history from my progress note on 11/08/2017.  Family History:  Family History  Problem Relation Age of Onset  . Diabetes Mother   . Pancreatitis Mother   . Hypertension Mother   . Alcohol abuse Mother   . Schizophrenia Mother   . Depression Mother   . Diabetes Sister        pre-diabetic  . Diabetes Brother   . Drug abuse Brother   . ADD / ADHD Son   . Eczema Son   . Breast cancer Cousin        pat cousin  . Anxiety disorder Other   . Ovarian cancer Neg Hx     Social History: I have reviewed social history from my progress note on 11/08/2017. Social History   Socioeconomic History  . Marital status: Married    Spouse name: jeffrey  . Number of children: 2  .  Years of education: Not on file  . Highest education level: Bachelor's degree (e.g., BA, AB, BS)  Occupational History  . Not on file  Social Needs  . Financial resource strain: Not hard at all  . Food insecurity:    Worry: Never true    Inability: Never true  . Transportation needs:    Medical: No    Non-medical: No  Tobacco Use  . Smoking status: Never Smoker  . Smokeless tobacco: Never Used  Substance and Sexual Activity  . Alcohol use: No  . Drug use: No  . Sexual activity: Yes    Partners: Male    Birth control/protection: Other-see comments    Comment: nuvaring  Lifestyle  . Physical activity:    Days per week: 3 days    Minutes per session: 60 min  . Stress: Very much  Relationships  .  Social connections:    Talks on phone: Never    Gets together: Once a week    Attends religious service: More than 4 times per year    Active member of club or organization: Yes    Attends meetings of clubs or organizations: More than 4 times per year    Relationship status: Married  Other Topics Concern  . Not on file  Social History Narrative  . Not on file    Allergies:  Allergies  Allergen Reactions  . Date Seed Extract  [Zizyphus Jujuba] Anaphylaxis, Cough, Itching and Swelling    Metabolic Disorder Labs: Lab Results  Component Value Date   HGBA1C 5.5 10/22/2017   MPG 111 10/22/2017   MPG 88 10/11/2015   No results found for: PROLACTIN Lab Results  Component Value Date   CHOL 136 10/22/2017   TRIG 93 10/22/2017   HDL 60 10/22/2017   CHOLHDL 2.3 10/22/2017   VLDL 14 10/16/2016   LDLCALC 58 10/22/2017   LDLCALC 76 10/16/2016   Lab Results  Component Value Date   TSH 3.42 10/22/2017   TSH 3.89 03/21/2017    Therapeutic Level Labs: No results found for: LITHIUM No results found for: VALPROATE No components found for:  CBMZ  Current Medications: Current Outpatient Medications  Medication Sig Dispense Refill  . Cholecalciferol (VITAMIN D3) 1000 units CAPS Take 1,000 Int'l Units by mouth daily.     . citalopram (CELEXA) 40 MG tablet Take 1 tablet (40 mg total) by mouth daily. 90 tablet 1  . EPINEPHrine 0.3 mg/0.3 mL IJ SOAJ injection INJECT 0.3 MLS INTO THE MUSCLE ONCE FOR 1 DOSE. FOR LIFE-THREATENING ALLERGIC REACTION / ANAPHYLAXIS  1  . lamoTRIgine (LAMICTAL) 25 MG tablet Take 3 tablets (75 mg total) by mouth daily. 270 tablet 0  . levothyroxine (SYNTHROID, LEVOTHROID) 50 MCG tablet TAKE 1 TABLET (50 MCG TOTAL) BY MOUTH DAILY BEFORE BREAKFAST. 90 tablet 1  . metFORMIN (GLUCOPHAGE-XR) 500 MG 24 hr tablet Take 3 tablets (1,500 mg total) by mouth daily. 270 tablet 0  . Multiple Vitamins-Minerals (MULTIVITAMIN WITH MINERALS) tablet Take 1 tablet by mouth daily.     Marland Kitchen. NUVARING 0.12-0.015 MG/24HR vaginal ring INSERT 1 RING VAGINALLY AS DIRECTED. REMOVE AFTER 3 WEEKS & WAIT 7 DAYS BEFORE INSERTING A NEW RING 3 each 0  . nystatin-triamcinolone ointment (MYCOLOG) Apply 1 application topically 2 (two) times daily. X 14 days 30 g 2  . polyethylene glycol (MIRALAX / GLYCOLAX) packet Take 17 g by mouth daily.    . traZODone (DESYREL) 50 MG tablet Take 1.5-2 tablets (75-100 mg total) by mouth at  bedtime as needed for sleep. FOR SLEEP 180 tablet 1   No current facility-administered medications for this visit.      Musculoskeletal: Strength & Muscle Tone: within normal limits Gait & Station: normal Patient leans: N/A  Psychiatric Specialty Exam: Review of Systems  Neurological: Positive for tingling.  Psychiatric/Behavioral: The patient is nervous/anxious (situational) and has insomnia.   All other systems reviewed and are negative.   Blood pressure 120/79, pulse 86, height 5\' 6"  (1.676 m), weight 251 lb 3.2 oz (113.9 kg), SpO2 95 %.Body mass index is 40.54 kg/m.  General Appearance: Casual  Eye Contact:  Fair  Speech:  Clear and Coherent  Volume:  Normal  Mood:  Anxious  Affect:  Congruent  Thought Process:  Goal Directed and Descriptions of Associations: Intact  Orientation:  Full (Time, Place, and Person)  Thought Content: Logical   Suicidal Thoughts:  No  Homicidal Thoughts:  No  Memory:  Immediate;   Fair Recent;   Fair Remote;   Fair  Judgement:  Fair  Insight:  Fair  Psychomotor Activity:  Normal  Concentration:  Concentration: Fair and Attention Span: Fair  Recall:  FiservFair  Fund of Knowledge: Fair  Language: Fair  Akathisia:  No  Handed:  Right  AIMS (if indicated): Denies tremors, rigidity  Assets:  Communication Skills Desire for Improvement Social Support  ADL's:  Intact  Cognition: WNL  Sleep:  Poor   Screenings: PHQ2-9     [facility]  PHQ-2 Total Score  0  2  3  0  1  PHQ-9 Total Score  0  7  18  -  -       Assessment and Plan: Kathryn Munoz is a 42 year old African-American female, married, employed, [place] in [place], has a history of mood lability, sleep problems, hypothyroidism, presented to the clinic today for a follow-up visit.  Patient is biologically predisposed given her family history of mental health problems as well as history of trauma.  She also has work-related stressors as well as her mother who continues to struggle with medical problems.  Patient continues to struggle with sleep problems.  Will make the following medication changes.  She will continue psychotherapy sessions.  Plan Bipolar disorder type II Lamictal 75 mg p.o. daily Celexa 40 mg p.o. daily  For OCD Continue psychotherapy with Dr. Clarisa Kindredynthia Herlong Celexa 40 mg p.o. daily.  For insomnia Increase trazodone to 75 to 100 mg p.o. nightly as needed Her pain could also be contributing to her sleep problems.  Follow-up in clinic in 1 month or sooner if needed.  More than 50 % of the time was spent for psychoeducation and supportive psychotherapy and care coordination.  This note was generated in part or whole with voice recognition software. Voice recognition is usually quite accurate but there are transcription errors that can and very often do occur. I apologize for any typographical errors that were not detected and corrected.       Jomarie LongsSaramma Espn Zeman, MD 03/19/2018, 4:54 PM

## 2018-03-22 ENCOUNTER — Ambulatory Visit
Admission: RE | Admit: 2018-03-22 | Discharge: 2018-03-22 | Disposition: A | Discharge status: Home / Self Care | Payer: BC Managed Care – PPO | Attending: Family Medicine | Admitting: Family Medicine

## 2018-03-22 ENCOUNTER — Encounter: Payer: Self-pay | Admitting: Family Medicine

## 2018-03-22 ENCOUNTER — Ambulatory Visit: Payer: BC Managed Care – PPO | Admitting: Family Medicine

## 2018-03-22 ENCOUNTER — Ambulatory Visit
Admission: RE | Admit: 2018-03-22 | Discharge: 2018-03-22 | Disposition: A | Discharge status: Home / Self Care | Payer: BC Managed Care – PPO | Source: Ambulatory Visit | Attending: Family Medicine | Admitting: Family Medicine

## 2018-03-22 VITALS — BP 120/68 | HR 81 | Temp 98.4°F | Ht 65.0 in | Wt 250.8 lb

## 2018-03-22 DIAGNOSIS — E039 Hypothyroidism, unspecified: Secondary | ICD-10-CM | POA: Diagnosis not present

## 2018-03-22 DIAGNOSIS — M25532 Pain in left wrist: Secondary | ICD-10-CM | POA: Insufficient documentation

## 2018-03-22 DIAGNOSIS — R200 Anesthesia of skin: Secondary | ICD-10-CM | POA: Diagnosis not present

## 2018-03-22 DIAGNOSIS — R635 Abnormal weight gain: Secondary | ICD-10-CM

## 2018-03-22 DIAGNOSIS — R7301 Impaired fasting glucose: Secondary | ICD-10-CM

## 2018-03-22 MED ORDER — MELOXICAM 15 MG PO TABS: 15.0000 mg | ORAL_TABLET | Freq: Every day | ORAL | 0 refills | Status: DC

## 2018-03-22 NOTE — Progress Notes (Signed)
BP 120/68   Pulse 81   Temp 98.4 F (36.9 C) (Oral)   Ht 5\' 5"  (1.651 m)   Wt 250 lb 12.8 oz (113.8 kg)   LMP 01/02/2018   SpO2 98%   BMI 41.74 kg/m    Subjective:    Patient ID: Kathryn Munoz, female    DOB: 1975-11-11, 43 y.o.   MRN: [id number]  HPI: Kathryn Munoz is a 43 y.o. female  Chief Complaint  Patient presents with  . Numbness    tingling bilateral wrist and hands left side swollen, has hx carpal tunnel    HPI Patient is here for an acute visit She has been having numbness and tingling in both wrists and hands There is swelling in the left side; painful over the extensor surface Worse the splint for a few days; no ice; ibuprofen two tablets (400 mg), just once a day She has a hx of carpal tunnel syndrome Pain for two weeks, numbness and tingling for one month She is not aware of anyone in the family with gout Pain level is a 2 or 3 out of 10 Right-handed No injury  Morbid obesity; insulin resistance Weight has gone up over the holidays; she has bee more active; however, for a week didn't have a kitchen so she was eating out every meal; has not cooked in a few weeks or so  Mood is doing okay, seeing Dr. Elna Breslow  Depression screen [facility] 2/9 03/22/2018 02/08/2018 11/08/2017 10/22/2017 03/29/2017  Decreased Interest 0 0 1 0 0  Down, Depressed, Hopeless 0 0 1 3 0  PHQ - 2 Score 0 0 2 3 0  Altered sleeping 0 0 1 3 -  Tired, decreased energy 0 0 1 3 -  Change in appetite 0 0 0 3 -  Feeling bad or failure about yourself  0 0 1 3 -  Trouble concentrating 0 0 1 3 -  Moving slowly or fidgety/restless 0 0 1 0 -  Suicidal thoughts 0 0 0 0 -  PHQ-9 Score 0 0 7 18 -  Difficult doing work/chores Not difficult at all Not difficult at all Very difficult Very difficult -   Fall Risk  03/22/2018 02/08/2018 11/08/2017 10/22/2017 03/29/2017  Falls in the past year? 0 0 No No No  Number falls in past yr: 0 - - - -  Injury with Fall? 0 - - - -    Relevant past medical, surgical,  family and social history reviewed Past Medical History:  Diagnosis Date  . Abnormal mammogram    breast biopsy, PASH 2014  . Anxiety   . Bipolar 2 disorder (HCC)   . Depression   . Dermatitis   . History of gestational diabetes   . History of kidney stones 2014  . Hypothyroidism   . IFG (impaired fasting glucose)   . Insulin resistance   . Irregular periods   . Neuropathy   . Obesity   . OCD (obsessive compulsive disorder)   . Vitamin D deficiency disease    Past Surgical History:  Procedure Laterality Date  . BREAST BIOPSY Left 2014   [place] bx/clip-neg  . INTRAUTERINE DEVICE INSERTION  03/11/2012   Family History  Problem Relation Age of Onset  . Diabetes Mother   . Pancreatitis Mother   . Hypertension Mother   . Alcohol abuse Mother   . Schizophrenia Mother   . Depression Mother   . Diabetes Sister        pre-diabetic  .  Diabetes Brother   . Drug abuse Brother   . ADD / ADHD Son   . Eczema Son   . Breast cancer Cousin        pat cousin  . Anxiety disorder Other   . Ovarian cancer Neg Hx    Social History   Tobacco Use  . Smoking status: Never Smoker  . Smokeless tobacco: Never Used  Substance Use Topics  . Alcohol use: No  . Drug use: No     Office Visit from 03/22/2018 in [facility]  AUDIT-C Score  0      Interim medical history since last visit reviewed. Allergies and medications reviewed  Review of Systems Per HPI unless specifically indicated above     Objective:    BP 120/68   Pulse 81   Temp 98.4 F (36.9 C) (Oral)   Ht 5\' 5"  (1.651 m)   Wt 250 lb 12.8 oz (113.8 kg)   LMP 01/02/2018   SpO2 98%   BMI 41.74 kg/m   Wt Readings from Last 3 Encounters:  03/22/18 250 lb 12.8 oz (113.8 kg)  02/08/18 245 lb 14.4 oz (111.5 kg)  01/22/18 244 lb 14.4 oz (111.1 kg)    Physical Exam Constitutional:      Appearance: She is well-developed. She is obese.  Eyes:     General: No scleral icterus. Cardiovascular:     Rate  and Rhythm: Normal rate.  Pulmonary:     Effort: Pulmonary effort is normal.  Musculoskeletal:     Left wrist: She exhibits decreased range of motion, tenderness and swelling. She exhibits no effusion, no crepitus and no deformity.  Neurological:     Mental Status: She is alert.     Comments: Grip strength 5-/5 on the LEFT; 5/5 on the right  Psychiatric:        Mood and Affect: Mood is not anxious or depressed.        Behavior: Behavior normal.     Results for orders placed or performed in visit on 02/08/18  Basic metabolic panel  Result Value Ref Range   Glucose, Bld 96 65 - 99 mg/dL   BUN 9 7 - 25 mg/dL   Creat 4.92 0.10 - 0.71 mg/dL   BUN/Creatinine Ratio NOT APPLICABLE 6 - 22 (calc)   Sodium 139 135 - 146 mmol/L   Potassium 4.3 3.5 - 5.3 mmol/L   Chloride 105 98 - 110 mmol/L   CO2 27 20 - 32 mmol/L   Calcium 9.5 8.6 - 10.2 mg/dL  Insulin, Free (Bioactive)  Result Value Ref Range   Insulin, Free 22.3 (H) 1.5 - 14.9 uIU/mL      Assessment & Plan:   Problem List Items Addressed This Visit      Endocrine   IFG (impaired fasting glucose)   Relevant Orders   BASIC METABOLIC PANEL WITH GFR   Hypothyroidism   Relevant Orders   TSH    Other Visit Diagnoses    Wrist pain, acute, left    -  Primary   Relevant Orders   DG Wrist Complete Left   Ambulatory referral to Orthopedics   CBC with Differential/Platelet   Uric acid   Bilateral hand numbness       Relevant Orders   Ambulatory referral to Orthopedics   Weight gain       Relevant Orders   TSH       Follow up plan: No follow-ups on file.  An after-visit summary  was printed and given to the patient at check-out.  Please see the patient instructions which may contain other information and recommendations beyond what is mentioned above in the assessment and plan.  Meds ordered this encounter  Medications  . meloxicam (MOBIC) 15 MG tablet    Sig: Take 1 tablet (15 mg total) by mouth daily. With food; do not  take any other NSAIDs    Dispense:  30 tablet    Refill:  0    Orders Placed This Encounter  Procedures  . DG Wrist Complete Left  . CBC with Differential/Platelet  . Uric acid  . TSH  . BASIC METABOLIC PANEL WITH GFR  . Ambulatory referral to Orthopedics

## 2018-03-22 NOTE — Progress Notes (Signed)
Greetings. It was a pleasure to see you today. Your complete blood count is normal. You have normal numbers of white blood cells, red blood cells, and platelets. The other labs are pending. Peace, Dr. Lada

## 2018-03-22 NOTE — Patient Instructions (Addendum)
Start the meloxicam (pain and inflammation) Take with food Do not take any other NSAIDs Tylenol is okay, per package directions Ice 15-20 minutes, protective cloth between ice and skin, 3-4 x a day We'll get xrays across the street We'll get labs We'll have you see the orthopaedist If you have not heard anything from my staff in a week about any orders/referrals/studies from today, please contact us here to follow-up [phone] If you develop abdominal pain or dark stools, stop the meloxicam immediately and get medical help  Check out the information at familydoctor.org entitled "Nutrition for Weight Loss: What You Need to Know about Fad Diets" Try to lose between 1-2 pounds per week by taking in fewer calories and burning off more calories You can succeed by limiting portions, limiting foods dense in calories and fat, becoming more active, and drinking 8 glasses of water a day (64 ounces) Don't skip meals, especially breakfast, as skipping meals may alter your metabolism Do not use over-the-counter weight loss pills or gimmicks that claim rapid weight loss A healthy BMI (or body mass index) is between 18.5 and 24.9 You can calculate your ideal BMI at the NIH website JobEconomics.huhttp://www.nhlbi.nih.gov/health/educational/lose_wt/BMI/bmicalc.htm   Obesity, Adult Obesity is the condition of having too much total body fat. Being overweight or obese means that your weight is greater than what is considered healthy for your body size. Obesity is determined by a measurement called BMI. BMI is an estimate of body fat and is calculated from height and weight. For adults, a BMI of 30 or higher is considered obese. Obesity can eventually lead to other health concerns and major illnesses, including:  Stroke.  Coronary artery disease (CAD).  Type 2 diabetes.  Some types of cancer, including cancers of the colon, breast, uterus, and gallbladder.  Osteoarthritis.  High blood pressure  (hypertension).  High cholesterol.  Sleep apnea.  Gallbladder stones.  Infertility problems. What are the causes? The main cause of obesity is taking in (consuming) more calories than your body uses for energy. Other factors that contribute to this condition may include:  Being born with genes that make you more likely to become obese.  Having a medical condition that causes obesity. These conditions include: ? Hypothyroidism. ? Polycystic ovarian syndrome (PCOS). ? Binge-eating disorder. ? Cushing syndrome.  Taking certain medicines, such as steroids, antidepressants, and seizure medicines.  Not being physically active (sedentary lifestyle).  Living where there are limited places to exercise safely or buy healthy foods.  Not getting enough sleep. What increases the risk? The following factors may increase your risk of this condition:  Having a family history of obesity.  Being a woman of African-American descent.  Being a man of Hispanic descent. What are the signs or symptoms? Having excessive body fat is the main symptom of this condition. How is this diagnosed? This condition may be diagnosed based on:  Your symptoms.  Your medical history.  A physical exam. Your health care provider may measure: ? Your BMI. If you are an adult with a BMI between 25 and less than 30, you are considered overweight. If you are an adult with a BMI of 30 or higher, you are considered obese. ? The distances around your hips and your waist (circumferences). These may be compared to each other to help diagnose your condition. ? Your skinfold thickness. Your health care provider may gently pinch a fold of your skin and measure it. How is this treated? Treatment for this condition often includes changing  your lifestyle. Treatment may include some or all of the following:  Dietary changes. Work with your health care provider and a dietitian to set a weight-loss goal that is healthy and  reasonable for you. Dietary changes may include eating: ? Smaller portions. A portion size is the amount of a particular food that is healthy for you to eat at one time. This varies from person to person. ? Low-calorie or low-fat options. ? More whole grains, fruits, and vegetables.  Regular physical activity. This may include aerobic activity (cardio) and strength training.  Medicine to help you lose weight. Your health care provider may prescribe medicine if you are unable to lose 1 pound a week after 6 weeks of eating more healthily and doing more physical activity.  Surgery. Surgical options may include gastric banding and gastric bypass. Surgery may be done if: ? Other treatments have not helped to improve your condition. ? You have a BMI of 40 or higher. ? You have life-threatening health problems related to obesity. Follow these instructions at home:  Eating and drinking   Follow recommendations from your health care provider about what you eat and drink. Your health care provider may advise you to: ? Limit fast foods, sweets, and processed snack foods. ? Choose low-fat options, such as low-fat milk instead of whole milk. ? Eat 5 or more servings of fruits or vegetables every day. ? Eat at home more often. This gives you more control over what you eat. ? Choose healthy foods when you eat out. ? Learn what a healthy portion size is. ? Keep low-fat snacks on hand. ? Avoid sugary drinks, such as soda, fruit juice, iced tea sweetened with sugar, and flavored milk. ? Eat a healthy breakfast.  Drink enough water to keep your urine clear or pale yellow.  Do not go without eating for long periods of time (do not fast) or follow a fad diet. Fasting and fad diets can be unhealthy and even dangerous. Physical Activity  Exercise regularly, as told by your health care provider. Ask your health care provider what types of exercise are safe for you and how often you should exercise.  Warm  up and stretch before being active.  Cool down and stretch after being active.  Rest between periods of activity. Lifestyle  Limit the time that you spend in front of your TV, computer, or video game system.  Find ways to reward yourself that do not involve food.  Limit alcohol intake to no more than 1 drink a day for nonpregnant women and 2 drinks a day for men. One drink equals 12 oz of beer, 5 oz of wine, or 1 oz of hard liquor. General instructions  Keep a weight loss journal to keep track of the food you eat and how much you exercise you get.  Take over-the-counter and prescription medicines only as told by your health care provider.  Take vitamins and supplements only as told by your health care provider.  Consider joining a support group. Your health care provider may be able to recommend a support group.  Keep all follow-up visits as told by your health care provider. This is important. Contact a health care provider if:  You are unable to meet your weight loss goal after 6 weeks of dietary and lifestyle changes. This information is not intended to replace advice given to you by your health care provider. Make sure you discuss any questions you have with your health care provider. Document Released:  04/13/2004 Document Revised: 08/09/2015 Document Reviewed: 12/23/2014 Elsevier Interactive Patient Education  2019 Elsevier Inc.  Preventing Unhealthy [employer], Adult Staying at a healthy weight is important to your overall health. When fat builds up in your body, you may become overweight or obese. Being overweight or obese increases your risk of developing certain health problems, such as heart disease, diabetes, sleeping problems, joint problems, and some types of cancer. Unhealthy weight gain is often the result of making unhealthy food choices or not getting enough exercise. You can make changes to your lifestyle to prevent obesity and stay as healthy as possible. What  nutrition changes can be made?   Eat only as much as your body needs. To do this: ? Pay attention to signs that you are hungry or full. Stop eating as soon as you feel full. ? If you feel hungry, try drinking water first before eating. Drink enough water so your urine is clear or pale yellow. ? Eat smaller portions. Pay attention to portion sizes when eating out. ? Look at serving sizes on food labels. Most foods contain more than one serving per container. ? Eat the recommended number of calories for your gender and activity level. For most active people, a daily total of 2,000 calories is appropriate. If you are trying to lose weight or are not very active, you may need to eat fewer calories. Talk with your health care provider or a diet and nutrition specialist (dietitian) about how many calories you need each day.  Choose healthy foods, such as: ? Fruits and vegetables. At each meal, try to fill at least half of your plate with fruits and vegetables. ? Whole grains, such as whole-wheat bread, brown rice, and quinoa. ? Lean meats, such as chicken or fish. ? Other healthy proteins, such as beans, eggs, or tofu. ? Healthy fats, such as nuts, seeds, fatty fish, and olive oil. ? Low-fat or fat-free dairy products.  Check food labels, and avoid food and drinks that: ? Are high in calories. ? Have added sugar. ? Are high in sodium. ? Have saturated fats or trans fats.  Cook foods in healthier ways, such as by baking, broiling, or grilling.  Make a meal plan for the week, and shop with a grocery list to help you stay on track with your purchases. Try to avoid going to the grocery store when you are hungry.  When grocery shopping, try to shop around the outside of the store first, where the fresh foods are. Doing this helps you to avoid prepackaged foods, which can be high in sugar, salt (sodium), and fat. What lifestyle changes can be made?   Exercise for 30 or more minutes on 5 or more  days each week. Exercising may include brisk walking, yard work, biking, running, swimming, and team sports like basketball and soccer. Ask your health care provider which exercises are safe for you.  Do muscle-strengthening activities, such as lifting weights or using resistance bands, on 2 or more days a week.  Do not use any products that contain nicotine or tobacco, such as cigarettes and e-cigarettes. If you need help quitting, ask your health care provider.  Limit alcohol intake to no more than 1 drink a day for nonpregnant women and 2 drinks a day for men. One drink equals 12 oz of beer, 5 oz of wine, or 1 oz of hard liquor.  Try to get 7-9 hours of sleep each night. What other changes can be made?  Keep  a food and activity journal to keep track of: ? What you ate and how many calories you had. Remember to count the calories in sauces, dressings, and side dishes. ? Whether you were active, and what exercises you did. ? Your calorie, weight, and activity goals.  Check your weight regularly. Track any changes. If you notice you have gained weight, make changes to your diet or activity routine.  Avoid taking weight-loss medicines or supplements. Talk to your health care provider before starting any new medicine or supplement.  Talk to your health care provider before trying any new diet or exercise plan. Why are these changes important? Eating healthy, staying active, and having healthy habits can help you to prevent obesity. Those changes also:  Help you manage stress and emotions.  Help you connect with friends and family.  Improve your self-esteem.  Improve your sleep.  Prevent long-term health problems. What can happen if changes are not made? Being obese or overweight can cause you to develop joint or bone problems, which can make it hard for you to stay active or do activities you enjoy. Being obese or overweight also puts stress on your heart and lungs and can lead to  health problems like diabetes, heart disease, and some cancers. Where to find more information Talk with your health care provider or a dietitian about healthy eating and healthy lifestyle choices. You may also find information from:  U.S. Department of Agriculture, MyPlate: https://ball-collins.biz/  American Heart Association: www.heart.org  Centers for Disease Control and Prevention: FootballExhibition.com.br Summary  Staying at a healthy weight is important to your overall health. It helps you to prevent certain diseases and health problems, such as heart disease, diabetes, joint problems, sleep disorders, and some types of cancer.  Being obese or overweight can cause you to develop joint or bone problems, which can make it hard for you to stay active or do activities you enjoy.  You can prevent unhealthy weight gain by eating a healthy diet, exercising regularly, not smoking, limiting alcohol, and getting enough sleep.  Talk with your health care provider or a dietitian for guidance about healthy eating and healthy lifestyle choices. This information is not intended to replace advice given to you by your health care provider. Make sure you discuss any questions you have with your health care provider. Document Released: 03/07/2016 Document Revised: 12/15/2016 Document Reviewed: 04/12/2016 Elsevier Interactive Patient Education  2019 [employer].

## 2018-03-23 LAB — BASIC METABOLIC PANEL WITH GFR
BUN: 9 mg/dL (ref 7–25)
CO2: 26 mmol/L (ref 20–32)
Calcium: 9 mg/dL (ref 8.6–10.2)
Chloride: 107 mmol/L (ref 98–110)
Creat: 0.8 mg/dL (ref 0.50–1.10)
GFR, EST AFRICAN AMERICAN: 107 mL/min/{1.73_m2} (ref >=60)
GFR, Est Non African American: 92 mL/min/{1.73_m2} (ref >=60)
Glucose, Bld: 163 mg/dL — ABNORMAL HIGH (ref 65–99)
Potassium: 4.1 mmol/L (ref 3.5–5.3)
Sodium: 142 mmol/L (ref 135–146)

## 2018-03-23 LAB — CBC WITH DIFFERENTIAL/PLATELET
ABSOLUTE MONOCYTES: 254 {cells}/uL (ref 200–950)
BASOS ABS: 31 {cells}/uL (ref 0–200)
BASOS PCT: 0.4 %
EOS ABS: 123 {cells}/uL (ref 15–500)
Eosinophils Relative: 1.6 %
HCT: 36.9 % (ref 35.0–45.0)
Hemoglobin: 12 g/dL (ref 11.7–15.5)
Lymphs Abs: 2664 cells/uL (ref 850–3900)
MCH: 30.9 pg (ref 27.0–33.0)
MCHC: 32.5 g/dL (ref 32.0–36.0)
MCV: 95.1 fL (ref 80.0–100.0)
MONOS PCT: 3.3 %
MPV: 9.8 fL (ref 7.5–12.5)
NEUTROS PCT: 60.1 %
Neutro Abs: 4628 cells/uL (ref 1500–7800)
PLATELETS: 302 10*3/uL (ref 140–400)
RBC: 3.88 10*6/uL (ref 3.80–5.10)
RDW: 10.9 % — ABNORMAL LOW (ref 11.0–15.0)
TOTAL LYMPHOCYTE: 34.6 %
WBC: 7.7 10*3/uL (ref 3.8–10.8)

## 2018-03-23 LAB — TSH: TSH: 1.92 mIU/L

## 2018-03-23 LAB — URIC ACID: Uric Acid, Serum: 5.2 mg/dL (ref 2.5–7.0)

## 2018-04-07 ENCOUNTER — Other Ambulatory Visit: Payer: Self-pay | Admitting: Family Medicine

## 2018-04-08 NOTE — Telephone Encounter (Signed)
Lab Results  Component Value Date   TSH 1.92 03/22/2018   T4TOTAL 11.1 03/21/2017

## 2018-04-18 ENCOUNTER — Encounter: Payer: Self-pay | Admitting: Psychiatry

## 2018-04-18 ENCOUNTER — Ambulatory Visit: Payer: BC Managed Care – PPO | Admitting: Psychiatry

## 2018-04-18 ENCOUNTER — Other Ambulatory Visit: Payer: Self-pay

## 2018-04-18 VITALS — BP 137/78 | HR 80 | Temp 99.3°F | Wt 250.2 lb

## 2018-04-18 DIAGNOSIS — F3181 Bipolar II disorder: Secondary | ICD-10-CM

## 2018-04-18 DIAGNOSIS — F429 Obsessive-compulsive disorder, unspecified: Secondary | ICD-10-CM | POA: Diagnosis not present

## 2018-04-18 DIAGNOSIS — F5105 Insomnia due to other mental disorder: Secondary | ICD-10-CM

## 2018-04-18 NOTE — Progress Notes (Signed)
[facility] MD OP Progress Note  04/18/2018 4:52 PM Kathryn Munoz  MRN:  [id number]  Chief Complaint: ' I am here for follow up." Chief Complaint    Follow-up; Medication Refill     HPI: Kathryn Munoz is a 43 year old African-American female, married, lives in [place], has a history of bipolar disorder type II, OCD, insomnia, insulin resistance, hypothyroidism, presented to clinic today for a follow-up visit.  Patient today reports her mood symptoms as making progress.  Patient is compliant on her Lamictal, Celexa.  She denies any side effects.  She reports sleep is improved on trazodone.  She takes 100 mg at bedtime now.  She continues to follow-up with her therapist on a regular basis.  Patient reports her work related stressors have resolved.  She has a better relationship with the person with whom she was having conflict with.  She reports that also has been very helpful.  Patient denies any suicidality, homicidality or perceptual disturbances.  Reviewed and discussed TSH labs dated 03/22/2018     Visit Diagnosis:    ICD-10-CM   1. Bipolar 2 disorder, major depressive episode (HCC) F31.81   2. Insomnia due to mental condition F51.05   3. 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 F42.9     Past Psychiatric History: Reviewed past psychiatric history from my progress note on 11/08/2017.  Past trials of Celexa.  She continues to be in psychotherapy with Dr. Clarisa Kindredynthia Herlong in [place].  Past Medical History:  Past Medical History:  Diagnosis Date  . Abnormal mammogram    breast biopsy, [facility] 2014  . Anxiety   . Bipolar 2 disorder (HCC)   . Depression   . Dermatitis   . History of gestational diabetes   . History of kidney stones 2014  . Hypothyroidism   . IFG (impaired fasting glucose)   . Insulin resistance   . Irregular periods   . Neuropathy   . Obesity   . OCD (obsessive compulsive disorder)   . Vitamin D deficiency disease     Past Surgical History:  Procedure  Laterality Date  . BREAST BIOPSY Left 2014   us bx/clip-neg  . INTRAUTERINE DEVICE INSERTION  03/11/2012    Family Psychiatric History: Reviewed family psychiatric history from my progress note on 11/08/2017.  Family History:  Family History  Problem Relation Age of Onset  . Diabetes Mother   . Pancreatitis Mother   . Hypertension Mother   . Alcohol abuse Mother   . Schizophrenia Mother   . Depression Mother   . Diabetes Sister        pre-diabetic  . Diabetes Brother   . Drug abuse Brother   . ADD / ADHD Son   . Eczema Son   . Breast cancer Cousin        pat cousin  . Anxiety disorder Other   . Ovarian cancer Neg Hx     Social History: Reviewed social history from my progress note on 11/08/2017. Social History   Socioeconomic History  . Marital status: Married    Spouse name: jeffrey  . Number of children: 2  . Years of education: Not on file  . Highest education level: Bachelor's degree (e.g., BA, AB, BS)  Occupational History  . Not on file  Social Needs  . Financial resource strain: Not hard at all  . Food insecurity:    Worry: Never true    Inability: Never true  . Transportation needs:    Medical: No  Non-medical: No  Tobacco Use  . Smoking status: Never Smoker  . Smokeless tobacco: Never Used  Substance and Sexual Activity  . Alcohol use: No  . Drug use: No  . Sexual activity: Yes    Partners: Male    Birth control/protection: Other-see comments    Comment: nuvaring  Lifestyle  . Physical activity:    Days per week: 3 days    Minutes per session: 60 min  . Stress: Very much  Relationships  . Social connections:    Talks on phone: Never    Gets together: Once a week    Attends religious service: More than 4 times per year    Active member of club or organization: Yes    Attends meetings of clubs or organizations: More than 4 times per year    Relationship status: Married  Other Topics Concern  . Not on file  Social History Narrative  .  Not on file    Allergies:  Allergies  Allergen Reactions  . Date Seed Extract  [Zizyphus Jujuba] Anaphylaxis, Cough, Itching and Swelling    Metabolic Disorder Labs: Lab Results  Component Value Date   HGBA1C 5.5 10/22/2017   MPG 111 10/22/2017   MPG 88 10/11/2015   No results found for: PROLACTIN Lab Results  Component Value Date   CHOL 136 10/22/2017   TRIG 93 10/22/2017   HDL 60 10/22/2017   CHOLHDL 2.3 10/22/2017   VLDL 14 10/16/2016   LDLCALC 58 10/22/2017   LDLCALC 76 10/16/2016   Lab Results  Component Value Date   TSH 1.92 03/22/2018   TSH 3.42 10/22/2017    Therapeutic Level Labs: No results found for: LITHIUM No results found for: VALPROATE No components found for:  CBMZ  Current Medications: Current Outpatient Medications  Medication Sig Dispense Refill  . Cholecalciferol (VITAMIN D3) 1000 units CAPS Take 1,000 Int'l Units by mouth daily.     . citalopram (CELEXA) 40 MG tablet Take 1 tablet (40 mg total) by mouth daily. 90 tablet 1  . EPINEPHrine 0.3 mg/0.3 mL IJ SOAJ injection INJECT 0.3 MLS INTO THE MUSCLE ONCE FOR 1 DOSE. FOR LIFE-THREATENING ALLERGIC REACTION / ANAPHYLAXIS  1  . lamoTRIgine (LAMICTAL) 25 MG tablet Take 3 tablets (75 mg total) by mouth daily. 270 tablet 0  . levothyroxine (SYNTHROID, LEVOTHROID) 50 MCG tablet TAKE 1 TABLET (50 MCG TOTAL) BY MOUTH DAILY BEFORE BREAKFAST. 90 tablet 3  . meloxicam (MOBIC) 15 MG tablet Take 1 tablet (15 mg total) by mouth daily. With food; do not take any other NSAIDs 30 tablet 0  . metFORMIN (GLUCOPHAGE-XR) 500 MG 24 hr tablet Take 3 tablets (1,500 mg total) by mouth daily. 270 tablet 0  . Multiple Vitamins-Minerals (MULTIVITAMIN WITH MINERALS) tablet Take 1 tablet by mouth daily.    Marland Kitchen NUVARING 0.12-0.015 MG/24HR vaginal ring INSERT 1 RING VAGINALLY AS DIRECTED. REMOVE AFTER 3 WEEKS & WAIT 7 DAYS BEFORE INSERTING A NEW RING 3 each 0  . nystatin-triamcinolone ointment (MYCOLOG) Apply 1 application topically  2 (two) times daily. X 14 days 30 g 2  . polyethylene glycol (MIRALAX / GLYCOLAX) packet Take 17 g by mouth daily.    . traZODone (DESYREL) 50 MG tablet Take 1.5-2 tablets (75-100 mg total) by mouth at bedtime as needed for sleep. FOR SLEEP 180 tablet 1   No current facility-administered medications for this visit.      Musculoskeletal: Strength & Muscle Tone: within normal limits Gait & Station: normal Patient leans:  N/A  Psychiatric Specialty Exam: Review of Systems  Psychiatric/Behavioral: Negative for depression. The patient is not nervous/anxious.   All other systems reviewed and are negative.   Blood pressure 137/78, pulse 80, temperature 99.3 F (37.4 C), temperature source Oral, weight 250 lb 3.2 oz (113.5 kg).Body mass index is 41.64 kg/m.  General Appearance: Casual  Eye Contact:  Fair  Speech:  Clear and Coherent  Volume:  Normal  Mood:  Euthymic  Affect:  Congruent  Thought Process:  Goal Directed and Descriptions of Associations: Intact  Orientation:  Full (Time, Place, and Person)  Thought Content: Logical   Suicidal Thoughts:  No  Homicidal Thoughts:  No  Memory:  Immediate;   Fair Recent;   Fair Remote;   Fair  Judgement:  Fair  Insight:  Fair  Psychomotor Activity:  Normal  Concentration:  Concentration: Fair and Attention Span: Fair  Recall:  FiservFair  Fund of Knowledge: Fair  Language: Fair  Akathisia:  No  Handed:  Right  AIMS (if indicated): na  Assets:  Communication Skills Desire for Improvement Social Support  ADL's:  Intact  Cognition: WNL  Sleep:  Fair   Screenings: PHQ2-9     [facility]  PHQ-2 Total Score  0  0  2  3  0  PHQ-9 Total Score  0   0  7  18  -       Assessment and Plan: Kathryn Munoz is a 43 year old African-American female, married, employed, lives in [place], has a history of mood lability, sleep problems, hypothyroidism, presented to the clinic today for a follow-up visit.  Patient is biologically predisposed given her family history of mental health problems as well as history of trauma.  She also has work-related stressors as well as her mother who continues to struggle with medical problems.  Patient however reports she is better able to cope with her stressors and tolerating her medications well.  Plan Bipolar disorder type II- improving Lamictal 75 mg p.o. daily Celexa 40 mg p.o. daily  OCD-improving Continue psychotherapy with Dr. Clarisa Kindredynthia Herlong Celexa 40 mg p.o. daily  For insomnia-improving Continue trazodone 100 mg p.o. nightly as needed  Reviewed labs-TSH-03/22/2018-within normal limits.  Follow-up in clinic in 3 months or sooner if needed.  I have spent atleast 15 minutes face to face with patient today. More than 50 % of the time was spent for psychoeducation and supportive psychotherapy and care coordination.  This note was generated in part or whole with voice recognition software. Voice recognition is usually quite accurate but there are transcription errors that can and very often do occur. I apologize for any typographical errors that were not detected and corrected.        Jomarie LongsSaramma Delorean Knutzen, MD 04/18/2018, 4:52 PM

## 2018-04-19 ENCOUNTER — Other Ambulatory Visit: Payer: Self-pay | Admitting: Family Medicine

## 2018-05-09 ENCOUNTER — Encounter: Payer: Self-pay | Admitting: Family Medicine

## 2018-05-09 ENCOUNTER — Ambulatory Visit: Payer: BC Managed Care – PPO | Admitting: Family Medicine

## 2018-05-09 ENCOUNTER — Telehealth: Payer: Self-pay | Admitting: Psychiatry

## 2018-05-09 VITALS — BP 118/74 | HR 78 | Temp 98.6°F | Ht 65.0 in | Wt 250.1 lb

## 2018-05-09 DIAGNOSIS — L83 Acanthosis nigricans: Secondary | ICD-10-CM | POA: Diagnosis not present

## 2018-05-09 DIAGNOSIS — E8881 Metabolic syndrome: Secondary | ICD-10-CM

## 2018-05-09 DIAGNOSIS — E039 Hypothyroidism, unspecified: Secondary | ICD-10-CM | POA: Diagnosis not present

## 2018-05-09 DIAGNOSIS — E559 Vitamin D deficiency, unspecified: Secondary | ICD-10-CM

## 2018-05-09 DIAGNOSIS — R7301 Impaired fasting glucose: Secondary | ICD-10-CM

## 2018-05-09 DIAGNOSIS — F3181 Bipolar II disorder: Secondary | ICD-10-CM

## 2018-05-09 MED ORDER — LAMOTRIGINE 100 MG PO TABS: 100.0000 mg | ORAL_TABLET | Freq: Every day | ORAL | 0 refills | Status: DC

## 2018-05-09 MED ORDER — NYSTATIN-TRIAMCINOLONE 100000-0.1 UNIT/GM-% EX OINT: 1.0000 "application " | TOPICAL_OINTMENT | Freq: Two times a day (BID) | CUTANEOUS | 2 refills | Status: DC

## 2018-05-09 NOTE — Assessment & Plan Note (Signed)
Goes along with insulin resistance

## 2018-05-09 NOTE — Patient Instructions (Addendum)
Let's get labs today We'll have you see the nutritionist Mindful shopping Try to move more Treat yourself to a fun water container   Preventing Unhealthy Weight Gain, Adult Staying at a healthy weight is important to your overall health. When fat builds up in your body, you may become overweight or obese. Being overweight or obese increases your risk of developing certain health problems, such as heart disease, diabetes, sleeping problems, joint problems, and some types of cancer. Unhealthy weight gain is often the result of making unhealthy food choices or not getting enough exercise. You can make changes to your lifestyle to prevent obesity and stay as healthy as possible. What nutrition changes can be made?   Eat only as much as your body needs. To do this: ? Pay attention to signs that you are hungry or full. Stop eating as soon as you feel full. ? If you feel hungry, try drinking water first before eating. Drink enough water so your urine is clear or pale yellow. ? Eat smaller portions. Pay attention to portion sizes when eating out. ? Look at serving sizes on food labels. Most foods contain more than one serving per container. ? Eat the recommended number of calories for your gender and activity level. For most active people, a daily total of 2,000 calories is appropriate. If you are trying to lose weight or are not very active, you may need to eat fewer calories. Talk with your health care provider or a diet and nutrition specialist (dietitian) about how many calories you need each day.  Choose healthy foods, such as: ? Fruits and vegetables. At each meal, try to fill at least half of your plate with fruits and vegetables. ? Whole grains, such as whole-wheat bread, brown rice, and quinoa. ? Lean meats, such as chicken or fish. ? Other healthy proteins, such as beans, eggs, or tofu. ? Healthy fats, such as nuts, seeds, fatty fish, and olive oil. ? Low-fat or fat-free dairy  products.  Check food labels, and avoid food and drinks that: ? Are high in calories. ? Have added sugar. ? Are high in sodium. ? Have saturated fats or trans fats.  Cook foods in healthier ways, such as by baking, broiling, or grilling.  Make a meal plan for the week, and shop with a grocery list to help you stay on track with your purchases. Try to avoid going to the grocery store when you are hungry.  When grocery shopping, try to shop around the outside of the store first, where the fresh foods are. Doing this helps you to avoid prepackaged foods, which can be high in sugar, salt (sodium), and fat. What lifestyle changes can be made?   Exercise for 30 or more minutes on 5 or more days each week. Exercising may include brisk walking, yard work, biking, running, swimming, and team sports like basketball and soccer. Ask your health care provider which exercises are safe for you.  Do muscle-strengthening activities, such as lifting weights or using resistance bands, on 2 or more days a week.  Do not use any products that contain nicotine or tobacco, such as cigarettes and e-cigarettes. If you need help quitting, ask your health care provider.  Limit alcohol intake to no more than 1 drink a day for nonpregnant women and 2 drinks a day for men. One drink equals 12 oz of beer, 5 oz of wine, or 1 oz of hard liquor.  Try to get 7-9 hours of sleep each night.  What other changes can be made?  Keep a food and activity journal to keep track of: ? What you ate and how many calories you had. Remember to count the calories in sauces, dressings, and side dishes. ? Whether you were active, and what exercises you did. ? Your calorie, weight, and activity goals.  Check your weight regularly. Track any changes. If you notice you have gained weight, make changes to your diet or activity routine.  Avoid taking weight-loss medicines or supplements. Talk to your health care provider before starting any  new medicine or supplement.  Talk to your health care provider before trying any new diet or exercise plan. Why are these changes important? Eating healthy, staying active, and having healthy habits can help you to prevent obesity. Those changes also:  Help you manage stress and emotions.  Help you connect with friends and family.  Improve your self-esteem.  Improve your sleep.  Prevent long-term health problems. What can happen if changes are not made? Being obese or overweight can cause you to develop joint or bone problems, which can make it hard for you to stay active or do activities you enjoy. Being obese or overweight also puts stress on your heart and lungs and can lead to health problems like diabetes, heart disease, and some cancers. Where to find more information Talk with your health care provider or a dietitian about healthy eating and healthy lifestyle choices. You may also find information from:  [place]. Department of Agriculture, MyPlate: https://ball-collins.biz/  American Heart Association: www.heart.org  Centers for Disease Control and Prevention: FootballExhibition.com.br Summary  Staying at a healthy weight is important to your overall health. It helps you to prevent certain diseases and health problems, such as heart disease, diabetes, joint problems, sleep disorders, and some types of cancer.  Being obese or overweight can cause you to develop joint or bone problems, which can make it hard for you to stay active or do activities you enjoy.  You can prevent unhealthy weight gain by eating a healthy diet, exercising regularly, not smoking, limiting alcohol, and getting enough sleep.  Talk with your health care provider or a dietitian for guidance about healthy eating and healthy lifestyle choices. This information is not intended to replace advice given to you by your health care provider. Make sure you discuss any questions you have with your health care provider. Document  Released: 03/07/2016 Document Revised: 12/15/2016 Document Reviewed: 04/12/2016 Elsevier Interactive Patient Education  2019 [employer].

## 2018-05-09 NOTE — Assessment & Plan Note (Signed)
Working on weight loss; offered referral to nutritionist, and she agreed

## 2018-05-09 NOTE — Telephone Encounter (Signed)
Increase Lamictal to 100 mg po daily. Called patient - left a message - in reference to PMD - Dr.Lada's message.

## 2018-05-09 NOTE — Assessment & Plan Note (Signed)
Last thyroid check was normal

## 2018-05-09 NOTE — Progress Notes (Signed)
BP 118/74   Pulse 78   Temp 98.6 F (37 C)   Ht 5\' 5"  (1.651 m)   Wt 250 lb 1.6 oz (113.4 kg)   SpO2 99%   BMI 41.62 kg/m    Subjective:    Patient ID: Kathryn Munoz, female    DOB: 1976-03-10, 43 y.o.   MRN: [id number]  HPI: Kathryn Munoz is a 43 y.o. female  Chief Complaint  Patient presents with  . Follow-up    HPI Patient is here for f/u Depression; seeing Dr. Elna BreslowEappen, psychiatrist; appetite is "crazy"; possible stress eating; she has zero will power against it; it's not hungry, not sure if boredom; eating what is in the house; not shopping when hungry; if she doesn't buy sodas and cookies... she has two boys at home She is not doing any exercise; she has done it before and knows what to do; feels like she needs to do it in the morning; just can't get up; taking trazodone at night, but would be willing to move that back; her schedule is really busy, kids are busy with sports; will plan to walk the track during baseball Not a good water a drinker High glucose; having dry mouth, some blurred vision; mother has diabetes, not sure how long she's had it; she's on insulin Insulin resistance;  Vitamin D deficiency; last level 30 in August; not a lot of time outdoors hypothyroidism Lab Results  Component Value Date   TSH 1.92 03/22/2018   T4TOTAL 11.1 03/21/2017  She had a procedure on her left wrist; they gave her a shot in the wrist and it really helped  [facility] 2/9 05/09/2018 03/22/2018 02/08/2018 11/08/2017 10/22/2017  Decreased Interest 2 0 0 1 0  Down, Depressed, Hopeless 1 0 0 1 3  PHQ - 2 Score 3 0 0 2 3  Altered sleeping 0 0 0 1 3  Tired, decreased energy 1 0 0 1 3  Change in appetite 3 0 0 0 3  Feeling bad or failure about yourself  1 0 0 1 3  Trouble concentrating 0 0 0 1 3  Moving slowly or fidgety/restless 0 0 0 1 0  Suicidal thoughts 0 0 0 0 0  PHQ-9 Score 8 0 0 7 18  Difficult doing work/chores Somewhat difficult Not difficult at all Not  difficult at all Very difficult Very difficult  MD note: no SI or HI  Fall Risk  05/09/2018 03/22/2018 02/08/2018 11/08/2017 10/22/2017  Falls in the past year? 0 0 0 No No  Number falls in past yr: - 0 - - -  Injury with Fall? - 0 - - -    Relevant past medical, surgical, family and social history reviewed Past Medical History:  Diagnosis Date  . Abnormal mammogram    breast biopsy, [facility] 2014  . Anxiety   . Bipolar 2 disorder (HCC)   . Depression   . Dermatitis   . History of gestational diabetes   . History of kidney stones 2014  . Hypothyroidism   . IFG (impaired fasting glucose)   . Insulin resistance   . Irregular periods   . Neuropathy   . Obesity   . OCD (obsessive compulsive disorder)   . Vitamin D deficiency disease    Past Surgical History:  Procedure Laterality Date  . BREAST BIOPSY Left 2014   us bx/clip-neg  . INTRAUTERINE DEVICE INSERTION  03/11/2012   Family History  Problem Relation Age of Onset  .  Diabetes Mother   . Pancreatitis Mother   . Hypertension Mother   . Alcohol abuse Mother   . Schizophrenia Mother   . Depression Mother   . Diabetes Sister        pre-diabetic  . Diabetes Brother   . Drug abuse Brother   . ADD / ADHD Son   . Eczema Son   . Breast cancer Cousin        pat cousin  . Anxiety disorder Other   . Ovarian cancer Neg Hx    Social History   Tobacco Use  . Smoking status: Never Smoker  . Smokeless tobacco: Never Used  Substance Use Topics  . Alcohol use: No  . Drug use: No     Office Visit from 05/09/2018 in [facility]  AUDIT-C Score  0      Interim medical history since last visit reviewed. Allergies and medications reviewed  Review of Systems Per HPI unless specifically indicated above     Objective:    BP 118/74   Pulse 78   Temp 98.6 F (37 C)   Ht 5\' 5"  (1.651 m)   Wt 250 lb 1.6 oz (113.4 kg)   SpO2 99%   BMI 41.62 kg/m   Wt Readings from Last 3 Encounters:  05/09/18 250 lb 1.6  oz (113.4 kg)  03/22/18 250 lb 12.8 oz (113.8 kg)  02/08/18 245 lb 14.4 oz (111.5 kg)    Physical Exam Constitutional:      General: She is not in acute distress.    Appearance: She is well-developed. She is obese. She is not diaphoretic.  HENT:     Head: Normocephalic and atraumatic.  Eyes:     General: No scleral icterus. Neck:     Thyroid: No thyromegaly.  Cardiovascular:     Rate and Rhythm: Normal rate and regular rhythm.     Heart sounds: Normal heart sounds. No murmur.  Pulmonary:     Effort: Pulmonary effort is normal. No respiratory distress.     Breath sounds: Normal breath sounds. No wheezing.  Abdominal:     General: Bowel sounds are normal. There is no distension.     Palpations: Abdomen is soft.  Skin:    General: Skin is warm and dry.     Coloration: Skin is not pale.  Neurological:     Mental Status: She is alert.  Psychiatric:        Behavior: Behavior normal.        Thought Content: Thought content normal.        Judgment: Judgment normal.     Results for orders placed or performed in visit on 03/22/18  CBC with Differential/Platelet  Result Value Ref Range   WBC 7.7 3.8 - 10.8 Thousand/uL   RBC 3.88 3.80 - 5.10 Million/uL   Hemoglobin 12.0 11.7 - 15.5 g/dL   HCT 32.9 51.8 - 84.1 %   MCV 95.1 80.0 - 100.0 fL   MCH 30.9 27.0 - 33.0 pg   MCHC 32.5 32.0 - 36.0 g/dL   RDW 66.0 (L) 63.0 - 16.0 %   Platelets 302 140 - 400 Thousand/uL   MPV 9.8 7.5 - 12.5 fL   Neutro Abs 4,628 1,500 - 7,800 cells/uL   Lymphs Abs 2,664 850 - 3,900 cells/uL   Absolute Monocytes 254 200 - 950 cells/uL   Eosinophils Absolute 123 15 - 500 cells/uL   Basophils Absolute 31 0 - 200 cells/uL   Neutrophils Relative %  60.1 %   Total Lymphocyte 34.6 %   Monocytes Relative 3.3 %   Eosinophils Relative 1.6 %   Basophils Relative 0.4 %  Uric acid  Result Value Ref Range   Uric Acid, Serum 5.2 2.5 - 7.0 mg/dL  TSH  Result Value Ref Range   TSH 1.92 mIU/L  BASIC METABOLIC  PANEL WITH GFR  Result Value Ref Range   Glucose, Bld 163 (H) 65 - 99 mg/dL   BUN 9 7 - 25 mg/dL   Creat 1.85 6.31 - 4.97 mg/dL   GFR, Est Non African American 92 > OR = 60 mL/min/1.26m2   GFR, Est African American 107 > OR = 60 mL/min/1.42m2   BUN/Creatinine Ratio NOT APPLICABLE 6 - 22 (calc)   Sodium 142 135 - 146 mmol/L   Potassium 4.1 3.5 - 5.3 mmol/L   Chloride 107 98 - 110 mmol/L   CO2 26 20 - 32 mmol/L   Calcium 9.0 8.6 - 10.2 mg/dL      Assessment & Plan:   Problem List Items Addressed This Visit      Endocrine   Insulin resistance    Encouraged weight loss and exercise, continue metformin; check insulin level (fasting)      Relevant Orders   Insulin, random   COMPLETE METABOLIC PANEL WITH GFR   IFG (impaired fasting glucose) - Primary    Check insulin, A1c, and glucose (truly fasting)      Relevant Orders   Hemoglobin A1c   Amb ref to Medical Nutrition Therapy-MNT   Hypothyroidism    Last thyroid check was normal        Musculoskeletal and Integument   Acanthosis nigricans    Goes along with insulin resistance        Other   Morbid obesity (HCC)    Working on weight loss; offered referral to nutritionist, and she agreed      Relevant Orders   COMPLETE METABOLIC PANEL WITH GFR   Amb ref to Medical Nutrition Therapy-[facility]    Other Visit Diagnoses    Vitamin D deficiency       Relevant Orders   VITAMIN D 25 Hydroxy (Vit-D Deficiency, Fractures)       Follow up plan: Return in about 3 months (around 08/07/2018) for follow-up visit with Dr. Sherie Don.  An after-visit summary was printed and given to the patient at check-out.  Please see the patient instructions which may contain other information and recommendations beyond what is mentioned above in the assessment and plan.  Meds ordered this encounter  Medications  . nystatin-triamcinolone ointment (MYCOLOG)    Sig: Apply 1 application topically 2 (two) times daily. X 14 days    Dispense:  30 g     Refill:  2    Orders Placed This Encounter  Procedures  . Insulin, random  . COMPLETE METABOLIC PANEL WITH GFR  . VITAMIN D 25 Hydroxy (Vit-D Deficiency, Fractures)  . Hemoglobin A1c  . Amb ref to Medical Nutrition Therapy-[facility]

## 2018-05-09 NOTE — Assessment & Plan Note (Signed)
Check insulin, A1c, and glucose (truly fasting)

## 2018-05-09 NOTE — Assessment & Plan Note (Signed)
Encouraged weight loss and exercise, continue metformin; check insulin level (fasting)

## 2018-05-10 LAB — COMPLETE METABOLIC PANEL WITH GFR
AG Ratio: 1.5 (calc) (ref 1.0–2.5)
ALT: 14 U/L (ref 6–29)
AST: 18 U/L (ref 10–30)
Albumin: 4 g/dL (ref 3.6–5.1)
Alkaline phosphatase (APISO): 46 U/L (ref 31–125)
BUN: 11 mg/dL (ref 7–25)
CALCIUM: 8.9 mg/dL (ref 8.6–10.2)
CO2: 27 mmol/L (ref 20–32)
Chloride: 105 mmol/L (ref 98–110)
Creat: 0.85 mg/dL (ref 0.50–1.10)
GFR, Est African American: 99 mL/min/{1.73_m2} (ref >=60)
GFR, Est Non African American: 85 mL/min/{1.73_m2} (ref >=60)
Globulin: 2.7 g/dL (calc) (ref 1.9–3.7)
Glucose, Bld: 111 mg/dL — ABNORMAL HIGH (ref 65–99)
Potassium: 4.3 mmol/L (ref 3.5–5.3)
Sodium: 141 mmol/L (ref 135–146)
Total Bilirubin: 0.7 mg/dL (ref 0.2–1.2)
Total Protein: 6.7 g/dL (ref 6.1–8.1)

## 2018-05-10 LAB — VITAMIN D 25 HYDROXY (VIT D DEFICIENCY, FRACTURES): Vit D, 25-Hydroxy: 29 ng/mL — ABNORMAL LOW (ref 30–100)

## 2018-05-10 LAB — INSULIN, RANDOM: Insulin: 21.6 u[IU]/mL — ABNORMAL HIGH

## 2018-05-10 LAB — HEMOGLOBIN A1C
Hgb A1c MFr Bld: 4.8 % of total Hgb (ref <5.7)
Mean Plasma Glucose: 91 (calc)
eAG (mmol/L): 5 (calc)

## 2018-05-31 ENCOUNTER — Other Ambulatory Visit: Payer: Self-pay | Admitting: Obstetrics and Gynecology

## 2018-05-31 ENCOUNTER — Other Ambulatory Visit: Payer: Self-pay

## 2018-05-31 MED ORDER — ETONOGESTREL-ETHINYL ESTRADIOL 0.12-0.015 MG/24HR VA RING: 1.0000 | VAGINAL_RING | VAGINAL | 0 refills | Status: DC

## 2018-06-06 ENCOUNTER — Other Ambulatory Visit: Payer: Self-pay | Admitting: Family Medicine

## 2018-06-06 NOTE — Telephone Encounter (Signed)
Lab Results  Component Value Date   CREATININE 0.85 05/09/2018

## 2018-06-10 ENCOUNTER — Other Ambulatory Visit: Payer: Self-pay | Admitting: Family Medicine

## 2018-06-10 ENCOUNTER — Ambulatory Visit: Payer: BC Managed Care – PPO | Admitting: Dietician

## 2018-07-01 ENCOUNTER — Ambulatory Visit: Payer: BC Managed Care – PPO | Admitting: Dietician

## 2018-07-14 ENCOUNTER — Other Ambulatory Visit: Payer: Self-pay | Admitting: Psychiatry

## 2018-07-17 ENCOUNTER — Encounter: Payer: Self-pay | Admitting: Psychiatry

## 2018-07-17 ENCOUNTER — Ambulatory Visit (INDEPENDENT_AMBULATORY_CARE_PROVIDER_SITE_OTHER): Payer: BC Managed Care – PPO | Admitting: Psychiatry

## 2018-07-17 ENCOUNTER — Other Ambulatory Visit: Payer: Self-pay

## 2018-07-17 DIAGNOSIS — F5105 Insomnia due to other mental disorder: Secondary | ICD-10-CM | POA: Diagnosis not present

## 2018-07-17 DIAGNOSIS — F429 Obsessive-compulsive disorder, unspecified: Secondary | ICD-10-CM

## 2018-07-17 DIAGNOSIS — F3181 Bipolar II disorder: Secondary | ICD-10-CM

## 2018-07-17 MED ORDER — TRAZODONE HCL 50 MG PO TABS: 150.0000 mg | ORAL_TABLET | Freq: Every evening | ORAL | 1 refills | Status: DC | PRN

## 2018-07-17 NOTE — Progress Notes (Signed)
Virtual Visit via Video Note  I connected with Kathryn Munoz on 07/17/18 at  4:00 PM EDT by a video enabled telemedicine application and verified that I am speaking with the correct person using two identifiers.   I discussed the limitations of evaluation and management by telemedicine and the availability of in person appointments. The patient expressed understanding and agreed to proceed.   I discussed the assessment and treatment plan with the patient. The patient was provided an opportunity to ask questions and all were answered. The patient agreed with the plan and demonstrated an understanding of the instructions.   The patient was advised to call back or seek an in-person evaluation if the symptoms worsen or if the condition fails to improve as anticipated.   BH MD  OP Progress Note  07/17/2018 6:04 PM Kathryn Munoz  MRN:  [id number]  Chief Complaint:  Chief Complaint    Follow-up     HPI: Kathryn Munoz is a 43 year old African-American female, married, lives in [place], has a history of bipolar disorder type II, OCD, insomnia, insulin resistance, hypothyroidism was evaluated by telemedicine today.  Due to conduction problem the video consult had to be changed to a telephone call halfway through the session.  Patient today reports she continues to struggle with a lot of anxiety symptoms.  She reports it is likely due to the COVID-19 outbreak and work related stressors.  She reports she struggles with sleep problems.  She is currently taking trazodone 100 mg which is not helpful.  She reports she has not gone back to her therapist since the COVID-19 outbreak.  She reports she is motivated to call her soon and may be restart therapy sessions again.  Patient denies any suicidality, homicidality or perceptual disturbances.  She reports she continues to be compliant with her Lamictal and Celexa.  She was hoping the Lamictal will help her to lose some weight however that has not happened  yet.  She is not interested in a dosage change of her Lamictal today for her anxiety or mood symptoms.  She wants to try the therapy sessions first before making more changes with her medications.  She however reports she is interested in increasing her trazodone today.  She denies any other concerns today. Visit Diagnosis:    ICD-10-CM   1. Bipolar 2 disorder (HCC) F31.81    depressive episode  2. Insomnia due to mental condition F51.05 traZODone (DESYREL) 50 MG tablet  3. 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 F42.9     Past Psychiatric History: Reviewed past psychiatric history from my progress note on 11/08/2017.  Past trials of Celexa.  She continues to be in psychotherapy with Dr. Clarisa Kindred in [place].  Past Medical History:  Past Medical History:  Diagnosis Date  . Abnormal mammogram    breast biopsy, PASH 2014  . Anxiety   . Bipolar 2 disorder (HCC)   . Depression   . Dermatitis   . History of gestational diabetes   . History of kidney stones 2014  . Hypothyroidism   . IFG (impaired fasting glucose)   . Insulin resistance   . Irregular periods   . Neuropathy   . Obesity   . OCD (obsessive compulsive disorder)   . Vitamin D deficiency disease     Past Surgical History:  Procedure Laterality Date  . BREAST BIOPSY Left 2014   [place] bx/clip-neg  . INTRAUTERINE DEVICE INSERTION  03/11/2012    Family Psychiatric History: Reviewed family psychiatric  history from my progress note on 11/08/2017.  Family History:  Family History  Problem Relation Age of Onset  . Diabetes Mother   . Pancreatitis Mother   . Hypertension Mother   . Alcohol abuse Mother   . Schizophrenia Mother   . Depression Mother   . Diabetes Sister        pre-diabetic  . Diabetes Brother   . Drug abuse Brother   . ADD / ADHD Son   . Eczema Son   . Breast cancer Cousin        pat cousin  . Anxiety disorder Other   . Ovarian cancer Neg Hx     Social History: Reviewed social  history from my progress note on 11/08/2017. Social History   Socioeconomic History  . Marital status: Married    Spouse name: jeffrey  . Number of children: 2  . Years of education: Not on file  . Highest education level: Bachelor's degree (e.g., BA, AB, BS)  Occupational History  . Not on file  Social Needs  . Financial resource strain: Not hard at all  . Food insecurity:    Worry: Never true    Inability: Never true  . Transportation needs:    Medical: No    Non-medical: No  Tobacco Use  . Smoking status: Never Smoker  . Smokeless tobacco: Never Used  Substance and Sexual Activity  . Alcohol use: No  . Drug use: No  . Sexual activity: Yes    Partners: Male    Birth control/protection: Other-see comments    Comment: nuvaring  Lifestyle  . Physical activity:    Days per week: 3 days    Minutes per session: 60 min  . Stress: Very much  Relationships  . Social connections:    Talks on phone: Never    Gets together: Once a week    Attends religious service: More than 4 times per year    Active member of club or organization: Yes    Attends meetings of clubs or organizations: More than 4 times per year    Relationship status: Married  Other Topics Concern  . Not on file  Social History Narrative  . Not on file    Allergies:  Allergies  Allergen Reactions  . Date Seed Extract  [Zizyphus Jujuba] Anaphylaxis, Cough, Itching and Swelling    Metabolic Disorder Labs: Lab Results  Component Value Date   HGBA1C 4.8 05/09/2018   MPG 91 05/09/2018   MPG 111 10/22/2017   No results found for: PROLACTIN Lab Results  Component Value Date   CHOL 136 10/22/2017   TRIG 93 10/22/2017   HDL 60 10/22/2017   CHOLHDL 2.3 10/22/2017   VLDL 14 10/16/2016   LDLCALC 58 10/22/2017   LDLCALC 76 10/16/2016   Lab Results  Component Value Date   TSH 1.92 03/22/2018   TSH 3.42 10/22/2017    Therapeutic Level Labs: No results found for: LITHIUM No results found for:  VALPROATE No components found for:  CBMZ  Current Medications: Current Outpatient Medications  Medication Sig Dispense Refill  . Cholecalciferol (VITAMIN D3) 1000 units CAPS Take 1,000 Int'l Units by mouth daily.     . citalopram (CELEXA) 40 MG tablet TAKE 1 TABLET BY MOUTH EVERY DAY 90 tablet 1  . EPINEPHrine 0.3 mg/0.3 mL IJ SOAJ injection INJECT 0.3 MLS INTO THE MUSCLE ONCE FOR 1 DOSE. FOR LIFE-THREATENING ALLERGIC REACTION / ANAPHYLAXIS  1  . etonogestrel-ethinyl estradiol (NUVARING) 0.12-0.015 MG/24HR vaginal ring  Place 1 each vaginally every 28 (twenty-eight) days. Insert vaginally and leave in place for 3 consecutive weeks, then remove for 1 week. 3 each 0  . lamoTRIgine (LAMICTAL) 100 MG tablet Take 1 tablet (100 mg total) by mouth daily. 90 tablet 0  . levothyroxine (SYNTHROID, LEVOTHROID) 50 MCG tablet TAKE 1 TABLET (50 MCG TOTAL) BY MOUTH DAILY BEFORE BREAKFAST. 90 tablet 3  . meloxicam (MOBIC) 15 MG tablet TAKE 1 TABLET BY MOUTH DAILY AS NEEDED FOR PAIN. WITH FOOD DO NOT TAKE ANY OTHER NSAIDS 20 tablet 0  . metFORMIN (GLUCOPHAGE-XR) 500 MG 24 hr tablet TAKE 3 TABLETS (1,500 MG TOTAL) BY MOUTH DAILY. 270 tablet 1  . Multiple Vitamins-Minerals (MULTIVITAMIN WITH MINERALS) tablet Take 1 tablet by mouth daily.    Marland Kitchen nystatin-triamcinolone ointment (MYCOLOG) Apply 1 application topically 2 (two) times daily. X 14 days 30 g 2  . polyethylene glycol (MIRALAX / GLYCOLAX) packet Take 17 g by mouth as needed.     . traZODone (DESYREL) 50 MG tablet Take 3 tablets (150 mg total) by mouth at bedtime as needed for sleep. Patient has supplies 180 tablet 1   No current facility-administered medications for this visit.      Musculoskeletal: Strength & Muscle Tone: UTA Gait & Station: normal Patient leans: N/A  Psychiatric Specialty Exam: Review of Systems  Psychiatric/Behavioral: The patient is nervous/anxious and has insomnia.   All other systems reviewed and are negative.   There were no  vitals taken for this visit.There is no height or weight on file to calculate BMI.  General Appearance: Casual  Eye Contact:  Fair  Speech:  Clear and Coherent  Volume:  Normal  Mood:  Anxious  Affect:  Congruent  Thought Process:  Goal Directed and Descriptions of Associations: Intact  Orientation:  Full (Time, Place, and Person)  Thought Content: Logical   Suicidal Thoughts:  No  Homicidal Thoughts:  No  Memory:  Immediate;   Fair Recent;   Fair Remote;   Fair  Judgement:  Fair  Insight:  Fair  Psychomotor Activity:  Normal  Concentration:  Concentration: Fair and Attention Span: Fair  Recall:  [name] of Knowledge: Fair  Language: Fair  Akathisia:  No  Handed:  Right  AIMS (if indicated): UTA  Assets:  Communication Skills Desire for Improvement Housing Social Support  ADL's:  Intact  Cognition: WNL  Sleep:  Poor   Screenings: PHQ2-9     Office Visit from 05/09/2018 in [facility] Office Visit from 03/22/2018 in [facility] Office Visit from 02/08/2018 in [facility] Office Visit from 11/08/2017 in [facility] Office Visit from 10/22/2017 in [facility]  PHQ-2 Total Score  3  0  0  2  3  PHQ-9 Total Score  8  0  0  7  18       Assessment and Plan: Avra is a 43 year old African-American female, married, employed, lives in [place], has a history of mood lability, sleep problems, hypothyroidism, was evaluated by telemedicine today.  Patient is biologically predisposed given her family history of mental health problems as well as history of trauma.  She also has a current stressor of COVID-19 outbreak and work related problems.  Patient will benefit from medication changes since she continues to struggle with anxiety and sleep.  Plan as noted below.  Plan Bipolar disorder type II-improving Lamictal 100 mg p.o. daily Celexa 40 mg p.o. daily  OCD-improving Continue  psychotherapy with Dr. Aram Beecham her long. Celexa 40 mg p.o. daily  For insomnia-unstable Increase trazodone to 150 mg p.o. nightly as needed  Follow-up in clinic in 1 month or sooner if needed.  I have spent atleast 15 minutes non face to face with patient today. More than 50 % of the time was spent for psychoeducation and supportive psychotherapy and care coordination.  This note was generated in part or whole with voice recognition software. Voice recognition is usually quite accurate but there are transcription errors that can and very often do occur. I apologize for any typographical errors that were not detected and corrected.       Jomarie Longs, MD 07/18/2018, 9:04 AM

## 2018-08-01 ENCOUNTER — Other Ambulatory Visit: Payer: Self-pay | Admitting: Psychiatry

## 2018-08-01 DIAGNOSIS — F3181 Bipolar II disorder: Secondary | ICD-10-CM

## 2018-08-09 ENCOUNTER — Ambulatory Visit: Payer: Self-pay | Admitting: Family Medicine

## 2018-08-22 ENCOUNTER — Ambulatory Visit (INDEPENDENT_AMBULATORY_CARE_PROVIDER_SITE_OTHER): Payer: BC Managed Care – PPO | Admitting: Psychiatry

## 2018-08-22 ENCOUNTER — Other Ambulatory Visit: Payer: Self-pay

## 2018-08-22 ENCOUNTER — Encounter: Payer: Self-pay | Admitting: Psychiatry

## 2018-08-22 DIAGNOSIS — F3181 Bipolar II disorder: Secondary | ICD-10-CM

## 2018-08-22 DIAGNOSIS — F5105 Insomnia due to other mental disorder: Secondary | ICD-10-CM

## 2018-08-22 MED ORDER — LAMOTRIGINE 25 MG PO TABS: 25.0000 mg | ORAL_TABLET | Freq: Every day | ORAL | 0 refills | Status: DC

## 2018-08-22 MED ORDER — QUETIAPINE FUMARATE 25 MG PO TABS: 25.0000 mg | ORAL_TABLET | Freq: Every day | ORAL | 1 refills | Status: DC

## 2018-08-22 MED ORDER — TRAZODONE HCL 50 MG PO TABS: 50.0000 mg | ORAL_TABLET | Freq: Every evening | ORAL | 1 refills | Status: DC | PRN
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
## 2018-08-26 ENCOUNTER — Other Ambulatory Visit: Payer: Self-pay

## 2018-08-26 ENCOUNTER — Encounter: Payer: BC Managed Care – PPO | Attending: Family Medicine | Admitting: Dietician

## 2018-08-26 VITALS — Ht 65.0 in | Wt 252.6 lb

## 2018-08-26 DIAGNOSIS — R7301 Impaired fasting glucose: Secondary | ICD-10-CM

## 2018-08-26 DIAGNOSIS — Z6841 Body Mass Index (BMI) 40.0 and over, adult: Secondary | ICD-10-CM | POA: Diagnosis not present

## 2018-08-26 NOTE — Progress Notes (Signed)
Medical Nutrition Therapy: Visit start time: 0915  end time: 1020  Assessment:  Diagnosis: Impaired fasting glucose Past medical history: hypothyroidism Psychosocial issues/ stress concerns: bipolar disorder with depression; takes medication and sees mental health professionals regularly  Preferred learning method:  . Hands-on  Current weight: 252.6lbs Height: 5'5" Medications, supplements: reconciled list in medical record  Progress and evaluation:   Patient reports she has recently begun avoiding sodas and other sugary drinks; drinking only water at this time.   She has also decreased intake of fried foods. She reports working on changes gradually to promote long-term habit change.   In 2017, she lost weight down to 217lbs with healthy eating and regular exercise until she was hit with severe depression and healthy habits were derailed.   She would like help with managing emotional eating.  Reports mild allergy to peanuts; tested negative, but gets rash when eating them so she does avoid peanuts.   Physical activity: none at this time  Dietary Intake:  Usual eating pattern includes 3 meals and 2-3 snacks per day. Dining out frequency: 4-6 meals per week.  Breakfast: [place] Clap croissant; protein shake daily for the past week Snack: fruit Lunch: frozen meal at work; occasionally out Snack: popcorn 1 bag; pkg crackers on way home from work Supper: largest meal-- meat, often with starch, often no or low veg; recently started preparing zucchini, squash.  Snack: usually none Beverages: water, occ chocolate milk, stopped sodas  Nutrition Care Education: Topics covered: diabetes prevention, weight control Basic nutrition: basic food groups, appropriate nutrient balance, appropriate meal and snack schedule, general nutrition guidelines    Weight control: benefits of weight control in preventing diabetes; appropriate food portions; emotional eating -- behavior chains, tips and  strategies; benefits of tracking progress with goals and ways to track progress Diabetes prevention: appropriate meal and snack schedule, appropriate carb intake and balance, healthy carb choices, basic meal planning using plate method, managing intake of sweets and sugary beverages -- lower sugar and healthier options, importance of choosing low fat and lean foods   Nutritional Diagnosis:  [place]-2.2 Altered nutrition-related laboratory As related to Impaired fasting glucose.  As evidenced by patient with glucose lab results of 111, 163 in the past several months. [place]-3.3 Overweight/obesity As related to excess calories and inactivity.  As evidenced by patient with current BMI of 41.9 and patient report of dietary intake and physical activity.  Intervention:   Instruction as noted above.  Patient has begun working on diet changes and is motivated to continue.   Established goals with direction from patient for ongoing diet and lifestyle change.   She requests some follow-up via email with occasional in-person visits. Will plan to contact patient by email in the next 2 weeks. She will schedule RD follow-up after her next MD appointment.  Education Materials given:  . General diet guidelines for Diabetes (prevention) . Plate Planner with food lists . Behavior chains (AND) . Should I Eat flowsheet . Emotional Eating Tips (AND) . Goals/ instructions  Learner/ who was taught:  . Patient    Level of understanding: Marland Kitchen Verbalizes/ demonstrates competency   Demonstrated degree of understanding via:   Teach back Learning barriers: . None  Willingness to learn/ readiness for change: . Eager, change in progress   Monitoring and Evaluation:  Dietary intake, exercise, BG control, and body weight      follow up: in 6-8 weeks; by email in 1-2 weeks

## 2018-08-26 NOTE — Patient Instructions (Signed)
   Work on thinking through behavior chains to decrease emotional eating -- less often, smaller amounts/ portions, or at least healthier options.   Try including some low sugar Greek yogurt or hummus dip for a protein source with snacks.   Gradually increase physical exercise -- try walking on [place].

## 2018-09-05 ENCOUNTER — Ambulatory Visit (INDEPENDENT_AMBULATORY_CARE_PROVIDER_SITE_OTHER): Payer: BC Managed Care – PPO | Admitting: Psychiatry

## 2018-09-05 ENCOUNTER — Other Ambulatory Visit: Payer: Self-pay

## 2018-09-05 ENCOUNTER — Encounter: Payer: Self-pay | Admitting: Psychiatry

## 2018-09-05 DIAGNOSIS — F3181 Bipolar II disorder: Secondary | ICD-10-CM | POA: Insufficient documentation

## 2018-09-05 DIAGNOSIS — F5105 Insomnia due to other mental disorder: Secondary | ICD-10-CM | POA: Insufficient documentation

## 2018-09-05 NOTE — Progress Notes (Signed)
Virtual Visit via Video Note  I connected with Kathryn Munoz on 09/05/18 at  8:45 AM EDT by a video enabled telemedicine application and verified that I am speaking with the correct person using two identifiers.   I discussed the limitations of evaluation and management by telemedicine and the availability of in person appointments. The patient expressed understanding and agreed to proceed.   I discussed the assessment and treatment plan with the patient. The patient was provided an opportunity to ask questions and all were answered. The patient agreed with the plan and demonstrated an understanding of the instructions.   The patient was advised to call back or seek an in-person evaluation if the symptoms worsen or if the condition fails to improve as anticipated.   [place] MD OP Progress Note  09/05/2018 6:43 PM Kathryn Munoz  MRN:  [id number]  Chief Complaint:  Chief Complaint    Follow-up     HPI: Kathryn Munoz is a 43 year old African-American female, married, lives in [place], has a history of bipolar disorder, insomnia, OCD, insulin resistance, hypothyroidism was evaluated by telemedicine today.  Patient today reports she continues to be compliant with Seroquel.  She reports her depressive symptoms seems to be improving.  She reports even though she does have psychosocial stressors of her mother's health problems, her mother needing her attention as well as her son who has disability, she reports she is not too preoccupied with the stressors at this time.  That is an improvement.  She reports she continues to feel lack of motivation and tiredness however they are also improving.  She reports she continues to work with her therapist which is going well.  Patient denies any suicidality, homicidality or perceptual disturbances.  Patient denies any other concerns today. Visit Diagnosis:    ICD-10-CM   1. Bipolar 2 disorder, major depressive episode ([place])  F31.81   2. Insomnia due to mental  condition  F51.05     Past Psychiatric History: I have reviewed past psychiatric history from my progress note on 11/08/2017.  Past trials of Celexa.  Patient continues to be in psychotherapy with Dr. Elesa Massed in [place].  Past Medical History:  Past Medical History:  Diagnosis Date  . Abnormal mammogram    breast biopsy, [place] 2014  . Anxiety   . Bipolar 2 disorder ([place])   . Depression   . Dermatitis   . History of gestational diabetes   . History of kidney stones 2014  . Hypothyroidism   . IFG (impaired fasting glucose)   . Insulin resistance   . Irregular periods   . Neuropathy   . Obesity   . OCD (obsessive compulsive disorder)   . Vitamin D deficiency disease     Past Surgical History:  Procedure Laterality Date  . BREAST BIOPSY Left 2014   [place] bx/clip-neg  . INTRAUTERINE DEVICE INSERTION  03/11/2012    Family Psychiatric History: I have reviewed family psychiatric history from my progress note on 11/08/2017.  Family History:  Family History  Problem Relation Age of Onset  . Diabetes Mother   . Pancreatitis Mother   . Hypertension Mother   . Alcohol abuse Mother   . Schizophrenia Mother   . Depression Mother   . Diabetes Sister        pre-diabetic  . Diabetes Brother   . Drug abuse Brother   . ADD / ADHD Son   . Eczema Son   . Breast cancer Cousin  pat cousin  . Anxiety disorder Other   . Ovarian cancer Neg Hx     Social History: Reviewed social history from my progress note on 11/08/2017 Social History   Socioeconomic History  . Marital status: Married    Spouse name: jeffrey  . Number of children: 2  . Years of education: Not on file  . Highest education level: Bachelor's degree (e.g., BA, AB, BS)  Occupational History  . Not on file  Social Needs  . Financial resource strain: Not hard at all  . Food insecurity    Worry: Never true    Inability: Never true  . Transportation needs    Medical: No    Non-medical: No  Tobacco Use   . Smoking status: Never Smoker  . Smokeless tobacco: Never Used  Substance and Sexual Activity  . Alcohol use: No  . Drug use: No  . Sexual activity: Yes    Partners: Male    Birth control/protection: Other-see comments    Comment: nuvaring  Lifestyle  . Physical activity    Days per week: 3 days    Minutes per session: 60 min  . Stress: Very much  Relationships  . Social Musicianconnections    Talks on phone: Never    Gets together: Once a week    Attends religious service: More than 4 times per year    Active member of club or organization: Yes    Attends meetings of clubs or organizations: More than 4 times per year    Relationship status: Married  Other Topics Concern  . Not on file  Social History Narrative  . Not on file    Allergies:  Allergies  Allergen Reactions  . Date Seed Extract  [Zizyphus Jujuba] Anaphylaxis, Cough, Itching and Swelling    Metabolic Disorder Labs: Lab Results  Component Value Date   HGBA1C 4.8 05/09/2018   MPG 91 05/09/2018   MPG 111 10/22/2017   No results found for: PROLACTIN Lab Results  Component Value Date   CHOL 136 10/22/2017   TRIG 93 10/22/2017   HDL 60 10/22/2017   CHOLHDL 2.3 10/22/2017   VLDL 14 10/16/2016   LDLCALC 58 10/22/2017   LDLCALC 76 10/16/2016   Lab Results  Component Value Date   TSH 1.92 03/22/2018   TSH 3.42 10/22/2017    Therapeutic Level Labs: No results found for: LITHIUM No results found for: VALPROATE No components found for:  CBMZ  Current Medications: Current Outpatient Medications  Medication Sig Dispense Refill  . Cholecalciferol (VITAMIN D3) 1000 units CAPS Take 1,000 Int'l Units by mouth daily.     . citalopram (CELEXA) 40 MG tablet TAKE 1 TABLET BY MOUTH EVERY DAY 90 tablet 1  . EPINEPHrine 0.3 mg/0.3 mL IJ SOAJ injection INJECT 0.3 MLS INTO THE MUSCLE ONCE FOR 1 DOSE. FOR LIFE-THREATENING ALLERGIC REACTION / ANAPHYLAXIS  1  . etonogestrel-ethinyl estradiol (NUVARING) 0.12-0.015 MG/24HR  vaginal ring Place 1 each vaginally every 28 (twenty-eight) days. Insert vaginally and leave in place for 3 consecutive weeks, then remove for 1 week. 3 each 0  . lamoTRIgine (LAMICTAL) 100 MG tablet TAKE 1 TABLET BY MOUTH EVERY DAY 90 tablet 0  . lamoTRIgine (LAMICTAL) 25 MG tablet Take 1 tablet (25 mg total) by mouth daily. To be combined with Lamictal 100 mg 90 tablet 0  . levothyroxine (SYNTHROID, LEVOTHROID) 50 MCG tablet TAKE 1 TABLET (50 MCG TOTAL) BY MOUTH DAILY BEFORE BREAKFAST. 90 tablet 3  . meloxicam (MOBIC) 15 MG  tablet TAKE 1 TABLET BY MOUTH DAILY AS NEEDED FOR PAIN. WITH FOOD DO NOT TAKE ANY OTHER NSAIDS 20 tablet 0  . metFORMIN (GLUCOPHAGE-XR) 500 MG 24 hr tablet TAKE 3 TABLETS (1,500 MG TOTAL) BY MOUTH DAILY. 270 tablet 1  . Multiple Vitamins-Minerals (MULTIVITAMIN WITH MINERALS) tablet Take 1 tablet by mouth daily.    Marland Kitchen. nystatin-triamcinolone ointment (MYCOLOG) Apply 1 application topically 2 (two) times daily. X 14 days 30 g 2  . polyethylene glycol (MIRALAX / GLYCOLAX) packet Take 17 g by mouth as needed.     Marland Kitchen. QUEtiapine (SEROQUEL) 25 MG tablet Take 1 tablet (25 mg total) by mouth at bedtime. For mood and sleep 30 tablet 1  . traZODone (DESYREL) 50 MG tablet Take 1 tablet (50 mg total) by mouth at bedtime as needed for sleep. Patient has supplies 30 tablet 1   No current facility-administered medications for this visit.      Musculoskeletal: Strength & Muscle Tone: within normal limits Gait & Station: normal Patient leans: N/A  Psychiatric Specialty Exam: Review of Systems  Psychiatric/Behavioral: The patient is nervous/anxious.   All other systems reviewed and are negative.   There were no vitals taken for this visit.There is no height or weight on file to calculate BMI.  General Appearance: Casual  Eye Contact:  Fair  Speech:  Clear and Coherent  Volume:  Normal  Mood:  Anxious  Affect:  Congruent  Thought Process:  Goal Directed and Descriptions of  Associations: Intact  Orientation:  Full (Time, Place, and Person)  Thought Content: Logical   Suicidal Thoughts:  No  Homicidal Thoughts:  No  Memory:  Immediate;   Fair Recent;   Fair Remote;   Fair  Judgement:  Fair  Insight:  Fair  Psychomotor Activity:  Normal  Concentration:  Concentration: Fair and Attention Span: Fair  Recall:  FiservFair  Fund of Knowledge: Fair  Language: Fair  Akathisia:  No  Handed:  Right  AIMS (if indicated): denies tremors, rigidity  Assets:  Communication Skills Desire for Improvement Social Support  ADL's:  Intact  Cognition: WNL  Sleep:  Fair   Screenings: PHQ2-9     [facility]  PHQ-2 Total Score  2  3  0  0  2  PHQ-9 Total Score  9  8  0  0  7       Assessment and Plan: Kathryn Peerakisha is a 43 year old African-American female, married, employed, lives in [place], has a history of bipolar disorder, sleep problems, OCD, hypothyroidism was evaluated by telemedicine today.  Patient is biologically predisposed given her family history of mental health problems as well as history of trauma.  She also has current stressors of COVID-19 outbreak, husband losing his job.  Patient however is currently making progress with medications as well as therapy.  Plan as noted below.  Plan Bipolar disorder type II- improving Lamotrigine 125 mg p.o. daily Celexa 40 mg p.o. daily Seroquel 25 mg p.o. nightly.  OCD-improving Continue psychotherapy with Dr. Clarisa Kindredynthia Herlong Celexa 40 mg p.o. daily.  For insomnia-improving Seroquel 25 mg p.o. nightly She has trazodone available however has not been using it.  Follow-up in clinic in 3 to 4 weeks or sooner if needed.  August 4 at  2:15 pm  I have spent atleast 15 minutes non face to face with patient today. More than 50 % of the time was spent for psychoeducation and supportive psychotherapy and care coordination.  This note was generated in part or whole with voice recognition software. Voice recognition is usually quite accurate but there are transcription errors that can and very often do occur. I apologize for any typographical errors that were not detected and corrected.        Jomarie LongsSaramma Neliah Cuyler, MD 09/05/2018, 6:43 PM

## 2018-09-17 ENCOUNTER — Other Ambulatory Visit: Payer: Self-pay | Admitting: Psychiatry

## 2018-09-17 ENCOUNTER — Encounter: Payer: Self-pay | Admitting: Dietician

## 2018-09-17 DIAGNOSIS — F5105 Insomnia due to other mental disorder: Secondary | ICD-10-CM

## 2018-09-17 DIAGNOSIS — F3181 Bipolar II disorder: Secondary | ICD-10-CM

## 2018-09-17 NOTE — Progress Notes (Signed)
Sent the following email to patient to check on her dietary progress:   Hi [place], It's been about 3 weeks since we met to work on your diet changes, and I wanted to check in with you as promised to see how things are going.  We set these goals when we met on 6/8:  Work on thinking through behavior chains to decrease emotional eating -- less often, smaller amounts/ portions, or at least healthier options.   Try including some low sugar Greek yogurt or hummus dip for a protein source with snacks.   Gradually increase physical exercise -- try walking on [place].  Have you been able to make some progress with these goals yet? Any other positive changes or progress? What has not gone well for you recently? Do you have any questions for me?  I'm looking forward to hearing from you, and continuing to work on healthy eating and weight loss with you! Ferne Coe, RD, LDN, CDE [facility], [place] [phone] [email or web address]

## 2018-10-11 ENCOUNTER — Other Ambulatory Visit: Payer: Self-pay | Admitting: Obstetrics and Gynecology

## 2018-10-16 NOTE — Telephone Encounter (Signed)
Spoke with patient and she is going to see if her PCP will fill this. Patient is aware that this prescription has been refused.

## 2018-10-22 ENCOUNTER — Ambulatory Visit (INDEPENDENT_AMBULATORY_CARE_PROVIDER_SITE_OTHER): Payer: BC Managed Care – PPO | Admitting: Psychiatry

## 2018-10-22 ENCOUNTER — Encounter: Payer: Self-pay | Admitting: Psychiatry

## 2018-10-22 ENCOUNTER — Other Ambulatory Visit: Payer: Self-pay

## 2018-10-22 DIAGNOSIS — F5105 Insomnia due to other mental disorder: Secondary | ICD-10-CM

## 2018-10-22 DIAGNOSIS — F3181 Bipolar II disorder: Secondary | ICD-10-CM

## 2018-10-22 MED ORDER — LAMOTRIGINE 100 MG PO TABS: 100.0000 mg | ORAL_TABLET | Freq: Every day | ORAL | 0 refills | Status: DC

## 2018-10-22 MED ORDER — LAMOTRIGINE 25 MG PO TABS: 25.0000 mg | ORAL_TABLET | Freq: Every day | ORAL | 0 refills | Status: DC

## 2018-10-22 NOTE — Progress Notes (Signed)
Virtual Visit via Video Note  I connected with Kathryn Munoz on 10/22/18 at  9:00 AM EDT by a video enabled telemedicine application and verified that I am speaking with the correct person using two identifiers.   I discussed the limitations of evaluation and management by telemedicine and the availability of in person appointments. The patient expressed understanding and agreed to proceed.   I discussed the assessment and treatment plan with the patient. The patient was provided an opportunity to ask questions and all were answered. The patient agreed with the plan and demonstrated an understanding of the instructions.   The patient was advised to call back or seek an in-person evaluation if the symptoms worsen or if the condition fails to improve as anticipated.    [facility] MD OP Progress Note  10/22/2018 11:15 AM Kathryn Munoz  MRN:  [id number]  Chief Complaint:  Chief Complaint    Follow-up     HPI: Kathryn Munoz is a 43 year old African-American female,, lives in [place], has a history of bipolar disorder, insomnia, OCD, insulin resistance, hypothyroidism was evaluated by telemedicine today.  Patient today reports work is going well.  She recently had a proficiency test at work and she scored 100%.  She reports her family is also doing well.  She does report she is worried about the COVID-19 and the fact that her 65 year old son is going to do virtual schooling for now.  She however reports she is able to cope with her anxiety better than before.  She continues to be in therapy with her therapist which is going well.  Patient reports sleep is good.  She continues to have some vivid dreams at night when she talks in her sleep.  She however reports it does not distress her.  She is able to cope with it.  She wonders whether the Seroquel is causing it.  Discussed with her that if the dreams are getting worse to let writer know for medication readjustment.  For now she does not want to make any  changes.  Patient denies any suicidality, homicidality or perceptual disturbances. Visit Diagnosis:    ICD-10-CM   1. Bipolar 2 disorder, major depressive episode (HCC)  F31.81 lamoTRIgine (LAMICTAL) 25 MG tablet  2. Insomnia due to mental condition  F51.05   3. Bipolar 2 disorder (HCC)  F31.81 lamoTRIgine (LAMICTAL) 100 MG tablet    Past Psychiatric History: I have reviewed past psychiatric history from my progress note on 11/08/2017.  Past trials of Celexa.  Patient continues to be in psychotherapy.  Dr. Elesa Massed in [place].  Past Medical History:  Past Medical History:  Diagnosis Date  . Abnormal mammogram    breast biopsy, [place] 2014  . Anxiety   . Bipolar 2 disorder ([place])   . Depression   . Dermatitis   . History of gestational diabetes   . History of kidney stones 2014  . Hypothyroidism   . IFG (impaired fasting glucose)   . Insulin resistance   . Irregular periods   . Neuropathy   . Obesity   . OCD (obsessive compulsive disorder)   . Vitamin D deficiency disease     Past Surgical History:  Procedure Laterality Date  . BREAST BIOPSY Left 2014   [place] bx/clip-neg  . INTRAUTERINE DEVICE INSERTION  03/11/2012    Family Psychiatric History: I have reviewed family psychiatric history from my progress note on 11/08/2017.  Family History:  Family History  Problem Relation Age of Onset  . Diabetes  Mother   . Pancreatitis Mother   . Hypertension Mother   . Alcohol abuse Mother   . Schizophrenia Mother   . Depression Mother   . Diabetes Sister        pre-diabetic  . Diabetes Brother   . Drug abuse Brother   . ADD / ADHD Son   . Eczema Son   . Breast cancer Cousin        pat cousin  . Anxiety disorder Other   . Ovarian cancer Neg Hx     Social History: I have reviewed social history from my progress note on 11/08/2017. Social History   Socioeconomic History  . Marital status: Married    Spouse name: jeffrey  . Number of children: 2  . Years of  education: Not on file  . Highest education level: Bachelor's degree (e.g., BA, AB, BS)  Occupational History  . Not on file  Social Needs  . Financial resource strain: Not hard at all  . Food insecurity    Worry: Never true    Inability: Never true  . Transportation needs    Medical: No    Non-medical: No  Tobacco Use  . Smoking status: Never Smoker  . Smokeless tobacco: Never Used  Substance and Sexual Activity  . Alcohol use: No  . Drug use: No  . Sexual activity: Yes    Partners: Male    Birth control/protection: Other-see comments    Comment: nuvaring  Lifestyle  . Physical activity    Days per week: 3 days    Minutes per session: 60 min  . Stress: Very much  Relationships  . Social Musicianconnections    Talks on phone: Never    Gets together: Once a week    Attends religious service: More than 4 times per year    Active member of club or organization: Yes    Attends meetings of clubs or organizations: More than 4 times per year    Relationship status: Married  Other Topics Concern  . Not on file  Social History Narrative  . Not on file    Allergies:  Allergies  Allergen Reactions  . Date Seed Extract  [Zizyphus Jujuba] Anaphylaxis, Cough, Itching and Swelling    Metabolic Disorder Labs: Lab Results  Component Value Date   HGBA1C 4.8 05/09/2018   MPG 91 05/09/2018   MPG 111 10/22/2017   No results found for: PROLACTIN Lab Results  Component Value Date   CHOL 136 10/22/2017   TRIG 93 10/22/2017   HDL 60 10/22/2017   CHOLHDL 2.3 10/22/2017   VLDL 14 10/16/2016   LDLCALC 58 10/22/2017   LDLCALC 76 10/16/2016   Lab Results  Component Value Date   TSH 1.92 03/22/2018   TSH 3.42 10/22/2017    Therapeutic Level Labs: No results found for: LITHIUM No results found for: VALPROATE No components found for:  CBMZ  Current Medications: Current Outpatient Medications  Medication Sig Dispense Refill  . Cholecalciferol (VITAMIN D3) 1000 units CAPS Take  1,000 Int'l Units by mouth daily.     . citalopram (CELEXA) 40 MG tablet TAKE 1 TABLET BY MOUTH EVERY DAY 90 tablet 1  . EPINEPHrine 0.3 mg/0.3 mL IJ SOAJ injection INJECT 0.3 MLS INTO THE MUSCLE ONCE FOR 1 DOSE. FOR LIFE-THREATENING ALLERGIC REACTION / ANAPHYLAXIS  1  . etonogestrel-ethinyl estradiol (NUVARING) 0.12-0.015 MG/24HR vaginal ring Place 1 each vaginally every 28 (twenty-eight) days. Insert vaginally and leave in place for 3 consecutive weeks, then remove  for 1 week. 3 each 0  . lamoTRIgine (LAMICTAL) 100 MG tablet Take 1 tablet (100 mg total) by mouth daily. 90 tablet 0  . lamoTRIgine (LAMICTAL) 25 MG tablet Take 1 tablet (25 mg total) by mouth daily. To be combined with Lamictal 100 mg 90 tablet 0  . levothyroxine (SYNTHROID, LEVOTHROID) 50 MCG tablet TAKE 1 TABLET (50 MCG TOTAL) BY MOUTH DAILY BEFORE BREAKFAST. 90 tablet 3  . meloxicam (MOBIC) 15 MG tablet TAKE 1 TABLET BY MOUTH DAILY AS NEEDED FOR PAIN. WITH FOOD DO NOT TAKE ANY OTHER NSAIDS 20 tablet 0  . metFORMIN (GLUCOPHAGE-XR) 500 MG 24 hr tablet TAKE 3 TABLETS (1,500 MG TOTAL) BY MOUTH DAILY. 270 tablet 1  . Multiple Vitamins-Minerals (MULTIVITAMIN WITH MINERALS) tablet Take 1 tablet by mouth daily.    Marland Kitchen. nystatin-triamcinolone ointment (MYCOLOG) Apply 1 application topically 2 (two) times daily. X 14 days 30 g 2  . polyethylene glycol (MIRALAX / GLYCOLAX) packet Take 17 g by mouth as needed.     Marland Kitchen. QUEtiapine (SEROQUEL) 25 MG tablet TAKE 1 TABLET (25 MG TOTAL) BY MOUTH AT BEDTIME. FOR MOOD AND SLEEP 90 tablet 1  . traZODone (DESYREL) 50 MG tablet Take 1 tablet (50 mg total) by mouth at bedtime as needed for sleep. Patient has supplies 30 tablet 1   No current facility-administered medications for this visit.      Musculoskeletal: Strength & Muscle Tone: UTA Gait & Station: normal Patient leans: N/A  Psychiatric Specialty Exam: Review of Systems  Psychiatric/Behavioral: Negative for hallucinations and substance abuse.   All other systems reviewed and are negative.   There were no vitals taken for this visit.There is no height or weight on file to calculate BMI.  General Appearance: Casual  Eye Contact:  Fair  Speech:  Clear and Coherent  Volume:  Normal  Mood:  Euthymic  Affect:  Congruent  Thought Process:  Goal Directed and Descriptions of Associations: Intact  Orientation:  Full (Time, Place, and Person)  Thought Content: Logical   Suicidal Thoughts:  No  Homicidal Thoughts:  No  Memory:  Immediate;   Fair Recent;   Fair Remote;   Fair  Judgement:  Fair  Insight:  Fair  Psychomotor Activity:  Normal  Concentration:  Concentration: Fair and Attention Span: Fair  Recall:  FiservFair  Fund of Knowledge: Fair  Language: Fair  Akathisia:  No  Handed:  Right  AIMS (if indicated): denies tremors, rigidity  Assets:  Communication Skills Desire for Improvement Social Support  ADL's:  Intact  Cognition: WNL  Sleep : fair - but has vivid dreams   Screenings: PHQ2-9     [facility]  PHQ-2 Total Score  2  3  0  0  2  PHQ-9 Total Score  9  8  0  0  7       Assessment and Plan: Kathryn Munoz is a 43 year old African-American female, married, employed, [place] in [place], has a history of bipolar disorder, sleep problems, OCD, hypothyroidism was evaluated by telemedicine today.  Patient is biologically predisposed given her family history of mental health problems as well as history of trauma.  She also has current stressors of COVID-19 outbreak.  Patient however is making progress on the current  medication regimen as well as therapy.  Plan as noted below.  Bipolar disorder type II-improving Lamotrigine 125 mg p.o.  daily Celexa 40 mg p.o. daily Seroquel 25 mg p.o. nightly  For OCD- stable Continue psychotherapy with Dr. Clarisa Kindredynthia Herlong. Celexa 40 mg p.o. daily  For insomnia-improving Seroquel 25 mg p.o. nightly Trazodone as needed. Patient does report some vivid dreams however reports she is able to sleep well.  Follow-up in clinic in 2 months or sooner if needed.  October 14 at 2:30 PM  I have spent atleast 15 minutes non face to face with patient today. More than 50 % of the time was spent for psychoeducation and supportive psychotherapy and care coordination.  This note was generated in part or whole with voice recognition software. Voice recognition is usually quite accurate but there are transcription errors that can and very often do occur. I apologize for any typographical errors that were not detected and corrected.       Jomarie LongsSaramma Kinzey Sheriff, MD 10/22/2018, 11:15 AM

## 2018-10-25 ENCOUNTER — Other Ambulatory Visit: Payer: Self-pay | Admitting: Psychiatry

## 2018-10-25 DIAGNOSIS — F3181 Bipolar II disorder: Secondary | ICD-10-CM

## 2018-11-08 ENCOUNTER — Encounter: Payer: Self-pay | Admitting: Nurse Practitioner

## 2018-11-08 ENCOUNTER — Ambulatory Visit (INDEPENDENT_AMBULATORY_CARE_PROVIDER_SITE_OTHER): Payer: BC Managed Care – PPO | Admitting: Nurse Practitioner

## 2018-11-08 ENCOUNTER — Other Ambulatory Visit: Payer: Self-pay

## 2018-11-08 ENCOUNTER — Other Ambulatory Visit (HOSPITAL_COMMUNITY)
Admission: RE | Admit: 2018-11-08 | Discharge: 2018-11-08 | Disposition: A | Discharge status: Home / Self Care | Payer: BC Managed Care – PPO | Source: Ambulatory Visit | Attending: Nurse Practitioner | Admitting: Nurse Practitioner

## 2018-11-08 VITALS — BP 122/80 | HR 82 | Temp 97.1°F | Resp 14 | Ht 65.0 in | Wt 256.4 lb

## 2018-11-08 DIAGNOSIS — N898 Other specified noninflammatory disorders of vagina: Secondary | ICD-10-CM | POA: Insufficient documentation

## 2018-11-08 DIAGNOSIS — Z0000 Encounter for general adult medical examination without abnormal findings: Secondary | ICD-10-CM

## 2018-11-08 DIAGNOSIS — Z113 Encounter for screening for infections with a predominantly sexual mode of transmission: Secondary | ICD-10-CM | POA: Insufficient documentation

## 2018-11-08 DIAGNOSIS — B353 Tinea pedis: Secondary | ICD-10-CM

## 2018-11-08 DIAGNOSIS — Z1239 Encounter for other screening for malignant neoplasm of breast: Secondary | ICD-10-CM | POA: Diagnosis not present

## 2018-11-08 DIAGNOSIS — Z1159 Encounter for screening for other viral diseases: Secondary | ICD-10-CM

## 2018-11-08 DIAGNOSIS — Z5181 Encounter for therapeutic drug level monitoring: Secondary | ICD-10-CM | POA: Diagnosis not present

## 2018-11-08 DIAGNOSIS — Z114 Encounter for screening for human immunodeficiency virus [HIV]: Secondary | ICD-10-CM | POA: Diagnosis not present

## 2018-11-08 DIAGNOSIS — L304 Erythema intertrigo: Secondary | ICD-10-CM

## 2018-11-08 MED ORDER — TERBINAFINE HCL 1 % EX CREA: 1.0000 "application " | TOPICAL_CREAM | Freq: Two times a day (BID) | CUTANEOUS | 0 refills | Status: DC

## 2018-11-08 NOTE — Patient Instructions (Addendum)
- Use Eucerin cream on your feet 1-2 times daily for one week- if peeling does not improve you can use the terbinafine cream sent to the pharmacy twice daily for 3-4 weeks until feet have cleared up.  - You will receive a my chart message about your results within the next week, we will send any treatment needed for vaginal infections to your pharmacy and discuss them with you. I do recommend increasing probiotics in your diet to promote good gut and vaginal health.  - Keep skin fold areas clean and dry, work on healthy eating and follow-up as needed  Intertrigo Intertrigo is skin irritation or inflammation (dermatitis) that occurs when folds of skin rub together. The irritation can cause a rash and make skin raw and itchy. This condition most commonly occurs in the skin folds of these areas:  Toes.  Armpits.  Groin.  Under the belly.  Under the breasts.  Buttocks. Intertrigo is not passed from person to person (is not contagious). What are the causes? This condition is caused by heat, moisture, rubbing (friction), and not enough air circulation. The condition can be made worse by:  Sweat.  Bacteria.  A fungus, such as yeast. What increases the risk? This condition is more likely to occur if you have moisture in your skin folds. You are more likely to develop this condition if you:  Have diabetes.  Are overweight.  Are not able to move around or are not active.  Live in a warm and moist climate.  Wear splints, braces, or other medical devices.  Are not able to control your bowels or bladder (have incontinence). What are the signs or symptoms? Symptoms of this condition include:  A pink or red skin rash in the skin fold or near the skin fold.  Raw or scaly skin.  Itchiness.  A burning feeling.  Bleeding.  Leaking fluid.  A bad smell. How is this diagnosed? This condition is diagnosed with a medical history and physical exam. You may also have a skin swab to  test for bacteria or a fungus. How is this treated? This condition may be treated by:  Cleaning and drying your skin.  Taking an antibiotic medicine or using an antibiotic skin cream for a bacterial infection.  Using an antifungal cream on your skin or taking pills for an infection that was caused by a fungus, such as yeast.  Using a steroid ointment to relieve itchiness and irritation.  Separating the skin fold with a clean cotton cloth to absorb moisture and allow air to flow into the area. Follow these instructions at home:  Keep the affected area clean and dry.  Do not scratch your skin.  Stay in a cool environment as much as possible. Use an air conditioner or fan, if available.  Apply over-the-counter and prescription medicines only as told by your health care provider.  If you were prescribed an antibiotic medicine, use it as told by your health care provider. Do not stop using the antibiotic even if your condition improves.  Keep all follow-up visits as told by your health care provider. This is important. How is this prevented?  Maintain a healthy weight.  Take care of your feet, especially if you have diabetes. Foot care includes: ? Wearing shoes that fit well. ? Keeping your feet dry. ? Wearing clean, breathable socks.  Protect the skin around your groin and buttocks, especially if you have incontinence. Skin protection includes: ? Following a regular cleaning routine. ? Using  skin protectant creams, powders, or ointments. ? Changing protection pads frequently.  Do not wear tight clothes. Wear clothes that are loose, absorbent, and made of cotton.  Wear a bra that gives good support, if needed.  Shower and dry yourself well after activity or exercise. Use a hair dryer on a cool setting to dry between skin folds, especially after you bathe.  If you have diabetes, keep your blood sugar under control. Contact a health care provider if:  Your symptoms do not  improve with treatment.  Your symptoms get worse or they spread.  You notice increased redness and warmth.  You have a fever. Summary  Intertrigo is skin irritation or inflammation (dermatitis) that occurs when folds of skin rub together.  This condition is caused by heat, moisture, rubbing (friction), and not enough air circulation.  This condition may be treated by cleaning and drying your skin and with medicines.  Apply over-the-counter and prescription medicines only as told by your health care provider.  Keep all follow-up visits as told by your health care provider. This is important. This information is not intended to replace advice given to you by your health care provider. Make sure you discuss any questions you have with your health care provider. Document Released: 03/06/2005 Document Revised: 08/06/2017 Document Reviewed: 08/06/2017 Elsevier Patient Education  2020 [employer].   _____ General recommendations: 150 minutes of physical activity weekly, eat two servings of fish weekly, eat one serving of tree nuts ( cashews, pistachios, pecans, almonds.Marland Kitchen) every other day, eat 6 servings of fruit/vegetables daily and drink plenty of water and avoid sweet beverages. Recommend at least 64 ounces of water daily.   Please do call to schedule your mammogram; the number to schedule one at either [facility] or [facility] is [phone]  Stay Safe in the Sun The majority of sun exposure occurs before age 52 and skin cancer can take 20 years or more to develop. Whether your sun bathing days are behind you or you still spend time pursuing the perfect tan, you should be concerned about skin cancer.  Remember, the sun's ultraviolet (UV) rays can reflect off water, sand, concrete and snow, and can reach below the water's surface. Certain types of UV light penetrate fog and clouds, so it's possible to get sunburn even on overcast days.  Avoid direct  sunlight as much as possible during the peak sun hours, generally 10 a.m. to 3 p.m., or seek shade during this part of day. Wear broad-spectrum sunscreen - with an SPF of at least 30 - containing both UVA and UVB protection. Look for ingredients like [employer] (also known as avobenzone) or titanium dioxide on the label. Reapply sunscreen frequently, at least every two hours when outdoors, especially if you perspire or you've been swimming. Your best bet is to choose water-resistant products that are more likely to stay on your skin. Wear lip balm with an SPF 15 or higher. Wear a hat and other protective clothing while in the sun. Tightly woven fibers and darker clothing generally provide more protection. Also, look for products approved by the American Academy of Dermatology. Wear UV-protective sunglasses.

## 2018-11-08 NOTE — Progress Notes (Signed)
Name: Kathryn Munoz   MRN: [id number]    DOB: May 12, 1975   Date:11/08/2018       Progress Note  Subjective  Chief Complaint  Chief Complaint  Patient presents with  . Annual Exam  . Rash    groing area yeast infection? (patient aware of charge added to CPE)  . Tinea Pedis    HPI  Patient presents for annual CPE . She additionally notes foul odor and thin clear white discharge for about 7 days, states she increased her water intake and odor and discharge resolved.   Additionally has rash under abdominal folds- area of redness with occasional white patches- using nystatin cream for a over a year and rash has been intermittent. Right now it is present and has white patches too. Area is itchy, not painful.  Patient has athletes foot has been on and off for the past few years. Feet are not itchy but notes but has peeling on the sides of feet, not in between toes.   Diet:  [facility] and did well but the last month has been eating poorly Meals a day:  3 meals a day and snacks-  Eating a lot sweets  Vegetables servings: 3 days a week Fruits: 3 servings a day  Drinks: water daily- drinking about half a gallon, has been drinking soft drinks this past week  Exercise: none  USPSTF grade A and B recommendations    [facility]  AUDIT-C Score  0     Depression: Phq 9 is  negative Depression screen [facility] 2/[facility] 11/08/2018 08/26/2018 05/09/2018 03/22/2018 02/08/2018  Decreased Interest 0 1 2 0 0  Down, Depressed, Hopeless 0 1 1 0 0  PHQ - 2 Score 0 2 3 0 0  Altered sleeping 0 1 0 0 0  Tired, decreased energy 0 0 1 0 0  Change in appetite 0 2 3 0 0  Feeling bad or failure about yourself  0 1 1 0 0  Trouble concentrating 0 2 0 0 0  Moving slowly or fidgety/restless 0 1 0 0 0  Suicidal thoughts 0 0 0 0 0  PHQ-9 Score 0 9 8 0 0  Difficult doing work/chores Not difficult at all - Somewhat difficult Not difficult at all Not difficult  at all  Some recent data might be hidden   Hypertension: BP Readings from Last 3 Encounters:  11/08/18 122/80  05/09/18 118/74  03/22/18 120/68   Obesity: Wt Readings from Last 3 Encounters:  11/08/18 256 lb 6.4 oz (116.3 kg)  08/26/18 252 lb 9.6 oz (114.6 kg)  05/09/18 250 lb 1.6 oz (113.4 kg)   BMI Readings from Last 3 Encounters:  11/08/18 42.67 kg/m  08/26/18 42.03 kg/m  05/09/18 41.62 kg/m    Hep C Screening: ordered  STD testing and prevention (HIV/chl/gon/syphilis): ordered HIV declines others Intimate partner violence: denies  Sexual History/Pain during Intercourse: denies  Menstrual History/LMP/Abnormal Bleeding: denies   Breast cancer: ordered Cervical cancer screening: due 2022 Skin cancer: discussed Colorectal cancer: denies family history, due age 43.  Lung cancer:  Low Dose CT Chest recommended if Age 20-80 years, 30 pack-year currently smoking OR have quit w/in 15years. Patient does not qualify.    Lipids:  Lab Results  Component Value Date   CHOL 136 10/22/2017   CHOL 153 10/16/2016   CHOL 183 10/11/2015   Lab Results  Component Value Date   HDL 60 10/22/2017  HDL 63 10/16/2016   HDL 51 10/11/2015   Lab Results  Component Value Date   LDLCALC 58 10/22/2017   LDLCALC 76 10/16/2016   LDLCALC 109 10/11/2015   Lab Results  Component Value Date   TRIG 93 10/22/2017   TRIG 69 10/16/2016   TRIG 113 10/11/2015   Lab Results  Component Value Date   CHOLHDL 2.3 10/22/2017   CHOLHDL 2.4 10/16/2016   CHOLHDL 3.6 10/11/2015   No results found for: LDLDIRECT  Glucose:  Glucose, Bld  Date Value Ref Range Status  05/09/2018 111 (H) 65 - 99 mg/dL Final    Comment:    .            Fasting reference interval . For someone without known diabetes, a glucose value between 100 and 125 mg/dL is consistent with prediabetes and should be confirmed with a follow-up test. .   03/22/2018 163 (H) 65 - 99 mg/dL Final    Comment:    .             Fasting reference interval . For someone without known diabetes, a glucose value >125 mg/dL indicates that they may have diabetes and this should be confirmed with a follow-up test. .   02/08/2018 96 65 - 99 mg/dL Final    Comment:    .            Fasting reference interval .       Patient Active Problem List   Diagnosis Date Noted  . Bipolar 2 disorder, major depressive episode (HCC) 09/05/2018  . Insomnia due to mental condition 09/05/2018  . Morbid obesity (HCC) 05/09/2018  . Insulin resistance 10/27/2017  . Acanthosis nigricans 10/22/2017  . Moderate episode of recurrent major depressive disorder (HCC) 10/22/2017  . Acid reflux 04/01/2017  . Anxiety 06/01/2016  . Preventative health care 10/11/2015  . Peripheral neuropathy 10/09/2014  . Hypothyroidism   . IFG (impaired fasting glucose)   . Neuropathy     Past Surgical History:  Procedure Laterality Date  . BREAST BIOPSY Left 2014   us bx/clip-neg  . INTRAUTERINE DEVICE INSERTION  03/11/2012    Family History  Problem Relation Age of Onset  . Diabetes Mother   . Pancreatitis Mother   . Hypertension Mother   . Alcohol abuse Mother   . Schizophrenia Mother   . Depression Mother   . Diabetes Sister        pre-diabetic  . Diabetes Brother   . Drug abuse Brother   . ADD / ADHD Son   . Eczema Son   . Breast cancer Cousin        pat cousin  . Anxiety disorder Other   . Ovarian cancer Neg Hx     Social History   Socioeconomic History  . Marital status: Married    Spouse name: jeffrey  . Number of children: 2  . Years of education: Not on file  . Highest education level: Bachelor's degree (e.g., BA, AB, BS)  Occupational History  . Not on file  Social Needs  . Financial resource strain: Not hard at all  . Food insecurity    Worry: Never true    Inability: Never true  . Transportation needs    Medical: No    Non-medical: No  Tobacco Use  . Smoking status: Never Smoker  . Smokeless tobacco:  Never Used  Substance and Sexual Activity  . Alcohol use: No  . Drug use: No  . Sexual  activity: Yes    Partners: Male    Birth control/protection: Other-see comments    Comment: nuvaring  Lifestyle  . Physical activity    Days per week: 0 days    Minutes per session: 0 min  . Stress: Very much  Relationships  . Social Herbalist on phone: Never    Gets together: Once a week    Attends religious service: More than 4 times per year    Active member of club or organization: Yes    Attends meetings of clubs or organizations: More than 4 times per year    Relationship status: Married  . Intimate partner violence    Fear of current or ex partner: No    Emotionally abused: No    Physically abused: No    Forced sexual activity: No  Other Topics Concern  . Not on file  Social History Narrative  . Not on file     Current Outpatient Medications:  .  citalopram (CELEXA) 40 MG tablet, TAKE 1 TABLET BY MOUTH EVERY DAY, Disp: 90 tablet, Rfl: 1 .  EPINEPHrine 0.3 mg/0.3 mL IJ SOAJ injection, INJECT 0.3 MLS INTO THE MUSCLE ONCE FOR 1 DOSE. FOR LIFE-THREATENING ALLERGIC REACTION / ANAPHYLAXIS, Disp: , Rfl: 1 .  etonogestrel-ethinyl estradiol (NUVARING) 0.12-0.015 MG/24HR vaginal ring, Place 1 each vaginally every 28 (twenty-eight) days. Insert vaginally and leave in place for 3 consecutive weeks, then remove for 1 week., Disp: 3 each, Rfl: 0 .  lamoTRIgine (LAMICTAL) 100 MG tablet, Take 1 tablet (100 mg total) by mouth daily., Disp: 90 tablet, Rfl: 0 .  lamoTRIgine (LAMICTAL) 25 MG tablet, Take 1 tablet (25 mg total) by mouth daily. To be combined with Lamictal 100 mg, Disp: 90 tablet, Rfl: 0 .  levothyroxine (SYNTHROID, LEVOTHROID) 50 MCG tablet, TAKE 1 TABLET (50 MCG TOTAL) BY MOUTH DAILY BEFORE BREAKFAST., Disp: 90 tablet, Rfl: 3 .  metFORMIN (GLUCOPHAGE-XR) 500 MG 24 hr tablet, TAKE 3 TABLETS (1,500 MG TOTAL) BY MOUTH DAILY., Disp: 270 tablet, Rfl: 1 .  nystatin-triamcinolone  ointment (MYCOLOG), Apply 1 application topically 2 (two) times daily. X 14 days, Disp: 30 g, Rfl: 2 .  polyethylene glycol (MIRALAX / GLYCOLAX) packet, Take 17 g by mouth as needed. , Disp: , Rfl:  .  QUEtiapine (SEROQUEL) 25 MG tablet, TAKE 1 TABLET (25 MG TOTAL) BY MOUTH AT BEDTIME. FOR MOOD AND SLEEP, Disp: 90 tablet, Rfl: 1 .  traZODone (DESYREL) 50 MG tablet, Take 1 tablet (50 mg total) by mouth at bedtime as needed for sleep. Patient has supplies, Disp: 30 tablet, Rfl: 1 .  Cholecalciferol (VITAMIN D3) 1000 units CAPS, Take 1,000 Int'l Units by mouth daily. , Disp: , Rfl:  .  meloxicam (MOBIC) 15 MG tablet, TAKE 1 TABLET BY MOUTH DAILY AS NEEDED FOR PAIN. WITH FOOD DO NOT TAKE ANY OTHER NSAIDS (Patient not taking: Reported on 11/08/2018), Disp: 20 tablet, Rfl: 0 .  Multiple Vitamins-Minerals (MULTIVITAMIN WITH MINERALS) tablet, Take 1 tablet by mouth daily., Disp: , Rfl:   Allergies  Allergen Reactions  . Date Seed Extract  [Zizyphus Jujuba] Anaphylaxis, Cough, Itching and Swelling     Review of Systems  Constitutional: Negative for chills, fever and malaise/fatigue.  HENT: Negative for congestion, sinus pain and sore throat.   Eyes: Negative for blurred vision and double vision.  Respiratory: Negative for cough and shortness of breath.   Cardiovascular: Negative for chest pain, palpitations and leg swelling.  Gastrointestinal: Negative for abdominal  pain, blood in stool, constipation, diarrhea and nausea.  Genitourinary: Negative for dysuria and urgency.  Musculoskeletal: Negative for falls and joint pain.  Skin: Positive for rash.  Neurological: Negative for dizziness, weakness and headaches.  Endo/Heme/Allergies: Negative for polydipsia.  Psychiatric/Behavioral: The patient is nervous/anxious and has insomnia (gets about 8 hours, tossing and turning).      Objective  Vitals:   11/08/18 1534  BP: 122/80  Pulse: 82  Resp: 14  Temp: (!) 97.1 F (36.2 C)  SpO2: 99%   Weight: 256 lb 6.4 oz (116.3 kg)  Height: 5\' 5"  (1.651 m)    Body mass index is 42.67 kg/m.  Physical Exam Constitutional: Patient appears well-developed and obese. No distress.  HENT: Head: Normocephalic and atraumatic. Ears: B TMs ok, no erythema or effusion;  Eyes: Conjunctivae and EOM are normal. Pupils are equal, round, and reactive to light. No scleral icterus.  Neck: Normal range of motion. Neck supple. No JVD present. No thyromegaly present.  Cardiovascular: Normal rate, regular rhythm and normal heart sounds.   Pulmonary/Chest: Effort normal and breath sounds normal. No respiratory distress. Abdominal: Soft. Obese Bowel sounds are normal, no distension. There is no tenderness. no masses Breast: no lumps or masses, no nipple discharge or rashes FEMALE GENITALIA: deferred Musculoskeletal: Normal range of motion, no joint effusions. No gross deformities Neurological: he is alert and oriented to person, place, and time. No cranial nerve deficit. Coordination, balance, strength, speech and gait are normal.  Skin: Skin is warm and dry. Feet dry peeling skin along bilateral interior aspects of feet, some discoloration under skin flaps in upper and lower abdomen- no erythema, moist.  Psychiatric: Patient has a normal mood and affect. behavior is normal. Judgment and thought content normal.    No results found for this or any previous visit (from the past 2160 hour(s)).   Fall Risk: Fall Risk  11/08/2018 08/26/2018 05/09/2018 03/22/2018 02/08/2018  Falls in the past year? 0 0 0 0 0  Number falls in past yr: 0 - - 0 -  Injury with Fall? 0 - - 0 -     Functional Status Survey: Is the patient deaf or have difficulty hearing?: No Does the patient have difficulty seeing, even when wearing glasses/contacts?: No Does the patient have difficulty concentrating, remembering, or making decisions?: No Does the patient have difficulty walking or climbing stairs?: No Does the patient have  difficulty dressing or bathing?: No Does the patient have difficulty doing errands alone such as visiting a doctor's office or shopping?: No   Assessment & Plan  1. Preventative health care Discussed diet  - MM 3D SCREEN BREAST BILATERAL; Future - COMPLETE METABOLIC PANEL WITH GFR - Lipid Profile - HIV antibody (with reflex) - Hepatitis C Antibody  2. Screening for breast cancer - MM 3D SCREEN BREAST BILATERAL; Future  3. Medication monitoring encounter - COMPLETE METABOLIC PANEL WITH GFR - Lipid Profile  4. Encounter for screening for HIV - HIV antibody (with reflex)  5. Encounter for hepatitis C screening test for low risk patient - Hepatitis C Antibody  6. Screening for STD (sexually transmitted disease) - Cervicovaginal ancillary only  7. Vaginal discharge - Cervicovaginal ancillary only  8. Tinea pedis of both feet Refer to derm if not improving  - terbinafine (LAMISIL) 1 % cream; Apply 1 application topically 2 (two) times daily.  Dispense: 30 g; Refill: 0  9. Intertrigo Continue cream looks much improved keep area clean and dry  - referred to derm  per patients request   -USPSTF grade A and B recommendations reviewed with patient; age-appropriate recommendations, preventive care, screening tests, etc discussed and encouraged; healthy living encouraged; see AVS for patient education given to patient -Discussed importance of 150 minutes of physical activity weekly, eat two servings of fish weekly, eat one serving of tree nuts ( cashews, pistachios, pecans, almonds.Marland Kitchen.) every other day, eat 6 servings of fruit/vegetables daily and drink plenty of water and avoid sweet beverages.   -Reviewed Health Maintenance: labs

## 2018-11-11 LAB — LIPID PANEL
Cholesterol: 168 mg/dL (ref <200)
HDL: 54 mg/dL (ref >=50)
LDL Cholesterol (Calc): 95 mg/dL (calc)
Non-HDL Cholesterol (Calc): 114 mg/dL (calc) (ref <130)
Total CHOL/HDL Ratio: 3.1 (calc) (ref <5.0)
Triglycerides: 101 mg/dL (ref <150)

## 2018-11-11 LAB — HEPATITIS C ANTIBODY
Hepatitis C Ab: NONREACTIVE
SIGNAL TO CUT-OFF: 0.02 (ref <1.00)

## 2018-11-11 LAB — HIV ANTIBODY (ROUTINE TESTING W REFLEX): HIV 1&2 Ab, 4th Generation: NONREACTIVE

## 2018-11-11 LAB — COMPLETE METABOLIC PANEL WITH GFR
AG Ratio: 1.7 (calc) (ref 1.0–2.5)
ALT: 17 U/L (ref 6–29)
AST: 19 U/L (ref 10–30)
Albumin: 4.3 g/dL (ref 3.6–5.1)
Alkaline phosphatase (APISO): 53 U/L (ref 31–125)
BUN: 9 mg/dL (ref 7–25)
CO2: 24 mmol/L (ref 20–32)
Calcium: 9.4 mg/dL (ref 8.6–10.2)
Chloride: 105 mmol/L (ref 98–110)
Creat: 0.84 mg/dL (ref 0.50–1.10)
GFR, Est African American: 100 mL/min/{1.73_m2} (ref >=60)
GFR, Est Non African American: 86 mL/min/{1.73_m2} (ref >=60)
Globulin: 2.6 g/dL (calc) (ref 1.9–3.7)
Glucose, Bld: 85 mg/dL (ref 65–99)
Potassium: 4.1 mmol/L (ref 3.5–5.3)
Sodium: 139 mmol/L (ref 135–146)
Total Bilirubin: 0.7 mg/dL (ref 0.2–1.2)
Total Protein: 6.9 g/dL (ref 6.1–8.1)

## 2018-11-13 LAB — CERVICOVAGINAL ANCILLARY ONLY
Bacterial vaginitis: NEGATIVE
Candida vaginitis: NEGATIVE
Chlamydia: NEGATIVE
Neisseria Gonorrhea: NEGATIVE
Trichomonas: NEGATIVE

## 2018-11-21 ENCOUNTER — Other Ambulatory Visit: Payer: Self-pay

## 2018-11-21 ENCOUNTER — Telehealth: Payer: Self-pay

## 2018-11-21 ENCOUNTER — Other Ambulatory Visit: Payer: Self-pay | Admitting: Family Medicine

## 2018-11-21 ENCOUNTER — Other Ambulatory Visit: Payer: Self-pay | Admitting: Obstetrics and Gynecology

## 2018-11-21 NOTE — Telephone Encounter (Signed)
Patient states she did not get the birth control refill till mid April.  So will not need another refill until next week

## 2018-11-21 NOTE — Telephone Encounter (Signed)
Sorry duplicate messages

## 2018-11-21 NOTE — Telephone Encounter (Signed)
Copied from [place] [id number]. Topic: Quick Communication - Rx Refill/Question >> Nov 21, 2018  1:44 PM Yvette Rack wrote: Medication: etonogestrel-ethinyl estradiol (NUVARING) 0.12-0.015 MG/24HR vaginal ring  Has the patient contacted their pharmacy? no  Preferred Pharmacy (with phone number or street name): [id number] [address], [place] [phone] (Phone) [phone] (Fax)  Agent: Please be advised that RX refills may take up to 3 business days. We ask that you follow-up with your pharmacy.

## 2018-11-21 NOTE — Telephone Encounter (Signed)
Patient is going to call her PCP to see if they will fill.

## 2018-11-21 NOTE — Telephone Encounter (Signed)
Pt use to see[facility], but we do early CPE and pap's , just had recent CPE

## 2018-11-21 NOTE — Telephone Encounter (Signed)
erro  neous encounter

## 2018-11-21 NOTE — Telephone Encounter (Signed)
Pt states did not get filled to April and has not ran out yet will

## 2018-11-22 NOTE — Telephone Encounter (Signed)
Pt states setup appt with gyn

## 2018-11-26 ENCOUNTER — Other Ambulatory Visit: Payer: Self-pay

## 2018-11-26 ENCOUNTER — Ambulatory Visit
Admission: RE | Admit: 2018-11-26 | Discharge: 2018-11-26 | Disposition: A | Discharge status: Home / Self Care | Payer: BC Managed Care – PPO | Source: Ambulatory Visit | Attending: Nurse Practitioner | Admitting: Nurse Practitioner

## 2018-11-26 DIAGNOSIS — Z1231 Encounter for screening mammogram for malignant neoplasm of breast: Secondary | ICD-10-CM | POA: Diagnosis not present

## 2018-11-26 DIAGNOSIS — Z0000 Encounter for general adult medical examination without abnormal findings: Secondary | ICD-10-CM

## 2018-11-26 DIAGNOSIS — Z1239 Encounter for other screening for malignant neoplasm of breast: Secondary | ICD-10-CM

## 2018-11-27 ENCOUNTER — Encounter: Payer: BC Managed Care – PPO | Admitting: Obstetrics and Gynecology

## 2018-11-29 ENCOUNTER — Other Ambulatory Visit: Payer: Self-pay

## 2018-11-29 ENCOUNTER — Ambulatory Visit (INDEPENDENT_AMBULATORY_CARE_PROVIDER_SITE_OTHER): Payer: BC Managed Care – PPO | Admitting: Obstetrics and Gynecology

## 2018-11-29 ENCOUNTER — Encounter: Payer: Self-pay | Admitting: Obstetrics and Gynecology

## 2018-11-29 VITALS — BP 123/83 | HR 81 | Wt 256.4 lb

## 2018-11-29 DIAGNOSIS — Z3009 Encounter for other general counseling and advice on contraception: Secondary | ICD-10-CM

## 2018-11-29 MED ORDER — ETONOGESTREL-ETHINYL ESTRADIOL 0.12-0.015 MG/24HR VA RING: VAGINAL_RING | VAGINAL | 3 refills | Status: DC

## 2018-11-29 NOTE — Progress Notes (Signed)
Patient comes in today for refill of her BC.

## 2018-11-29 NOTE — Progress Notes (Signed)
HPI:      [name] is a 43 y.o. [id number] who LMP was Patient's last menstrual period was 11/19/2018.  Subjective:   She presents today because she would like to continue use of NuvaRing.  She is using it as prescribed 3 weeks and 1 week out having normal regular cycles.  She is using it both for birth control and cycle control. She states that she is followed by her primary care physician for well woman exams.  She would like to continue this. She does complain that the rash on her abdomen is back (history of extensive cutaneous monilia).  But she says she does not want it evaluated today because she has an appointment with a dermatologist next week.  We have discussed the issue of diabetes and cutaneous monilia and she states that it is very likely her sugars are out of control. She also complains of numbness and her middle finger on her left hand "locking".  She has previously been diagnosed with carpal tunnel syndrome and has received a steroid injection for this.  She would like to discuss this but plans to return to [facility] for continued evaluation.    Hx: The following portions of the patient's history were reviewed and updated as appropriate:             She  has a past medical history of Abnormal mammogram, Anxiety, Bipolar 2 disorder (HCC), Depression, Dermatitis, History of gestational diabetes, History of kidney stones (2014), Hypothyroidism, IFG (impaired fasting glucose), Insulin resistance, Irregular periods, Neuropathy, Obesity, OCD (obsessive compulsive disorder), and Vitamin D deficiency disease. She does not have any pertinent problems on file. She  has a past surgical history that includes Intrauterine device insertion (03/11/2012) and Breast biopsy (Left, 2014). Her family history includes ADD / ADHD in her son; Alcohol abuse in her mother; Anxiety disorder in an other family member; Breast cancer in her cousin; Depression in her mother; Diabetes in her brother,  mother, and sister; Drug abuse in her brother; Eczema in her son; Hypertension in her mother; Pancreatitis in her mother; Schizophrenia in her mother. She  reports that she has never smoked. She has never used smokeless tobacco. She reports that she does not drink alcohol or use drugs. She has a current medication list which includes the following prescription(s): vitamin d3, citalopram, epinephrine, etonogestrel-ethinyl estradiol, lamotrigine, lamotrigine, levothyroxine, metformin, multivitamin with minerals, nystatin-triamcinolone ointment, polyethylene glycol, quetiapine, terbinafine, trazodone, and etonogestrel-ethinyl estradiol. She is allergic to date seed extract  [zizyphus jujuba].       Review of Systems:  Review of Systems  Constitutional: Denied constitutional symptoms, night sweats, recent illness, fatigue, fever, insomnia and weight loss.  Eyes: Denied eye symptoms, eye pain, photophobia, vision change and visual disturbance.  Ears/Nose/Throat/Neck: Denied ear, nose, throat or neck symptoms, hearing loss, nasal discharge, sinus congestion and sore throat.  Cardiovascular: Denied cardiovascular symptoms, arrhythmia, chest pain/pressure, edema, exercise intolerance, orthopnea and palpitations.  Respiratory: Denied pulmonary symptoms, asthma, pleuritic pain, productive sputum, cough, dyspnea and wheezing.  Gastrointestinal: Denied, gastro-esophageal reflux, melena, nausea and vomiting.  Genitourinary: Denied genitourinary symptoms including symptomatic vaginal discharge, pelvic relaxation issues, and urinary complaints.  Musculoskeletal: Denied musculoskeletal symptoms, stiffness, swelling, muscle weakness and myalgia.  Dermatologic: Denied dermatology symptoms, rash and scar.  Neurologic: Denied neurology symptoms, dizziness, headache, neck pain and syncope.  Psychiatric: Denied psychiatric symptoms, anxiety and depression.  Endocrine: Denied endocrine symptoms including hot flashes and  night sweats.   Meds:   Current  Outpatient Medications on File Prior to Visit  Medication Sig Dispense Refill  . Cholecalciferol (VITAMIN D3) 1000 units CAPS Take 1,000 Int'l Units by mouth daily.     . citalopram (CELEXA) 40 MG tablet TAKE 1 TABLET BY MOUTH EVERY DAY 90 tablet 1  . EPINEPHrine 0.3 mg/0.3 mL IJ SOAJ injection INJECT 0.3 MLS INTO THE MUSCLE ONCE FOR 1 DOSE. FOR LIFE-THREATENING ALLERGIC REACTION / ANAPHYLAXIS  1  . etonogestrel-ethinyl estradiol (NUVARING) 0.12-0.015 MG/24HR vaginal ring Place 1 each vaginally every 28 (twenty-eight) days. Insert vaginally and leave in place for 3 consecutive weeks, then remove for 1 week. 3 each 0  . lamoTRIgine (LAMICTAL) 100 MG tablet Take 1 tablet (100 mg total) by mouth daily. 90 tablet 0  . lamoTRIgine (LAMICTAL) 25 MG tablet Take 1 tablet (25 mg total) by mouth daily. To be combined with Lamictal 100 mg 90 tablet 0  . levothyroxine (SYNTHROID, LEVOTHROID) 50 MCG tablet TAKE 1 TABLET (50 MCG TOTAL) BY MOUTH DAILY BEFORE BREAKFAST. 90 tablet 3  . metFORMIN (GLUCOPHAGE-XR) 500 MG 24 hr tablet TAKE 3 TABLETS (1,500 MG TOTAL) BY MOUTH DAILY. 270 tablet 1  . Multiple Vitamins-Minerals (MULTIVITAMIN WITH MINERALS) tablet Take 1 tablet by mouth daily.    Marland Kitchen nystatin-triamcinolone ointment (MYCOLOG) Apply 1 application topically 2 (two) times daily. X 14 days 30 g 2  . polyethylene glycol (MIRALAX / GLYCOLAX) packet Take 17 g by mouth as needed.     Marland Kitchen QUEtiapine (SEROQUEL) 25 MG tablet TAKE 1 TABLET (25 MG TOTAL) BY MOUTH AT BEDTIME. FOR MOOD AND SLEEP 90 tablet 1  . terbinafine (LAMISIL) 1 % cream Apply 1 application topically 2 (two) times daily. 30 g 0  . traZODone (DESYREL) 50 MG tablet Take 1 tablet (50 mg total) by mouth at bedtime as needed for sleep. Patient has supplies 30 tablet 1   No current facility-administered medications on file prior to visit.     Objective:     Vitals:   11/29/18 1024  BP: 123/83  Pulse: 81                 Assessment:    [id number] Patient Active Problem List   Diagnosis Date Noted  . Bipolar 2 disorder, major depressive episode ([place]) 09/05/2018  . Insomnia due to mental condition 09/05/2018  . Morbid obesity ([place]) 05/09/2018  . Insulin resistance 10/27/2017  . Acanthosis nigricans 10/22/2017  . Acid reflux 04/01/2017  . Anxiety 06/01/2016  . Peripheral neuropathy 10/09/2014  . Hypothyroidism   . IFG (impaired fasting glucose)   . Neuropathy      1. Birth control counseling     Patient would like to continue NuvaRing   Plan:            1.  Continue NuvaRing Orders No orders of the defined types were placed in this encounter.    Meds ordered this encounter  Medications  . etonogestrel-ethinyl estradiol (NUVARING) 0.12-0.015 MG/24HR vaginal ring    Sig: Insert vaginally and leave in place for 3 consecutive weeks, then remove for 1 week.    Dispense:  3 each    Refill:  3      F/U  Return in about 1 year (around 11/29/2019). I spent 18 minutes involved in the care of this patient of which greater than 50% was spent discussing use of NuvaRing, cutaneous monilia, relationship of monilia and diabetes, carpal tunnel syndrome.  All questions answered  Finis Bud, M.D. 11/29/2018 11:07 AM

## 2018-12-08 ENCOUNTER — Other Ambulatory Visit: Payer: Self-pay | Admitting: Psychiatry

## 2018-12-08 DIAGNOSIS — F5105 Insomnia due to other mental disorder: Secondary | ICD-10-CM

## 2019-01-01 ENCOUNTER — Other Ambulatory Visit: Payer: Self-pay

## 2019-01-01 ENCOUNTER — Encounter: Payer: Self-pay | Admitting: Psychiatry

## 2019-01-01 ENCOUNTER — Ambulatory Visit (INDEPENDENT_AMBULATORY_CARE_PROVIDER_SITE_OTHER): Payer: BC Managed Care – PPO | Admitting: Psychiatry

## 2019-01-01 DIAGNOSIS — F429 Obsessive-compulsive disorder, unspecified: Secondary | ICD-10-CM | POA: Diagnosis not present

## 2019-01-01 DIAGNOSIS — F3181 Bipolar II disorder: Secondary | ICD-10-CM

## 2019-01-01 DIAGNOSIS — F5105 Insomnia due to other mental disorder: Secondary | ICD-10-CM | POA: Diagnosis not present

## 2019-01-01 MED ORDER — LAMOTRIGINE 100 MG PO TABS: 100.0000 mg | ORAL_TABLET | Freq: Every day | ORAL | 0 refills | Status: DC

## 2019-01-01 MED ORDER — CITALOPRAM HYDROBROMIDE 40 MG PO TABS: 40.0000 mg | ORAL_TABLET | Freq: Every day | ORAL | 1 refills | Status: DC

## 2019-01-01 MED ORDER — LAMOTRIGINE 25 MG PO TABS: 25.0000 mg | ORAL_TABLET | Freq: Every day | ORAL | 0 refills | Status: DC

## 2019-01-01 NOTE — Progress Notes (Signed)
Virtual Visit via Video Note  I connected with Kathryn Munoz on 01/01/19 at  2:30 PM EDT by a video enabled telemedicine application and verified that I am speaking with the correct person using two identifiers.   I discussed the limitations of evaluation and management by telemedicine and the availability of in person appointments. The patient expressed understanding and agreed to proceed.  I discussed the assessment and treatment plan with the patient. The patient was provided an opportunity to ask questions and all were answered. The patient agreed with the plan and demonstrated an understanding of the instructions.   The patient was advised to call back or seek an in-person evaluation if the symptoms worsen or if the condition fails to improve as anticipated.   BH MD OP Progress Note  01/01/2019 5:24 PM Kathryn Munoz  MRN:  [id number]  Chief Complaint:  Chief Complaint    Follow-up     HPI: Kathryn Munoz is a 43 year old African-American female, lives in [place], has a history of bipolar disorder, insomnia, OCD, insulin resistance, hypothyroidism was evaluated by telemedicine today.  A video call was initiated however due to connection problem it had to be changed to a phone call.  Patient today reports mood wise she is okay.  She however does have some anxiety on and off more so because of her situational stressors.  She reports her husband's unemployment ran out and that does make her feel anxious.  She however reports they have been managing okay so far.  She also reports she feels bored most of the time due to the pandemic.  The social isolation and the restrictions affects her ability to do things that she used to enjoy before.  She reports work continues to be going well.  She reports she is compliant on her medications as prescribed.  She does report having an increased appetite.  She however does not think this is due to medications side effects however rather because she is bored  and does not have anything else to do.  Patient denies any suicidality, homicidality or perceptual disturbances.  She denies any other concerns today.   Visit Diagnosis:    ICD-10-CM   1. Bipolar 2 disorder, major depressive episode (HCC)  F31.81 lamoTRIgine (LAMICTAL) 100 MG tablet    lamoTRIgine (LAMICTAL) 25 MG tablet    citalopram (CELEXA) 40 MG tablet  2. Insomnia due to mental condition  F51.05   3. 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  F42.9 citalopram (CELEXA) 40 MG tablet    Past Psychiatric History: I reviewed past psychiatric history from my progress note on 11/08/2017.  Past trials of Celexa.  Patient continues to be in psychotherapy with Dr. Clarisa Kindredynthia Herlong in [place].  Past Medical History:  Past Medical History:  Diagnosis Date  . Abnormal mammogram    breast biopsy, PASH 2014  . Anxiety   . Bipolar 2 disorder (HCC)   . Depression   . Dermatitis   . History of gestational diabetes   . History of kidney stones 2014  . Hypothyroidism   . IFG (impaired fasting glucose)   . Insulin resistance   . Irregular periods   . Neuropathy   . Obesity   . OCD (obsessive compulsive disorder)   . Vitamin D deficiency disease     Past Surgical History:  Procedure Laterality Date  . BREAST BIOPSY Left 2014   us bx/clip-neg  . INTRAUTERINE DEVICE INSERTION  03/11/2012    Family Psychiatric History: Reviewed family  psychiatric history from my progress note on 11/08/2017.  Family History:  Family History  Problem Relation Age of Onset  . Diabetes Mother   . Pancreatitis Mother   . Hypertension Mother   . Alcohol abuse Mother   . Schizophrenia Mother   . Depression Mother   . Diabetes Sister        pre-diabetic  . Diabetes Brother   . Drug abuse Brother   . ADD / ADHD Son   . Eczema Son   . Breast cancer Cousin        pat cousin  . Anxiety disorder Other   . Ovarian cancer Neg Hx     Social History: Reviewed social history from my progress  note on 11/08/2017. Social History   Socioeconomic History  . Marital status: Married    Spouse name: jeffrey  . Number of children: 2  . Years of education: Not on file  . Highest education level: Bachelor's degree (e.g., BA, AB, BS)  Occupational History  . Not on file  Social Needs  . Financial resource strain: Not hard at all  . Food insecurity    Worry: Never true    Inability: Never true  . Transportation needs    Medical: No    Non-medical: No  Tobacco Use  . Smoking status: Never Smoker  . Smokeless tobacco: Never Used  Substance and Sexual Activity  . Alcohol use: No  . Drug use: No  . Sexual activity: Yes    Partners: Male    Birth control/protection: Other-see comments    Comment: nuvaring  Lifestyle  . Physical activity    Days per week: 0 days    Minutes per session: 0 min  . Stress: Very much  Relationships  . Social Herbalist on phone: Never    Gets together: Once a week    Attends religious service: More than 4 times per year    Active member of club or organization: Yes    Attends meetings of clubs or organizations: More than 4 times per year    Relationship status: Married  Other Topics Concern  . Not on file  Social History Narrative  . Not on file    Allergies:  Allergies  Allergen Reactions  . Date Seed Extract  [Zizyphus Jujuba] Anaphylaxis, Cough, Itching and Swelling    Metabolic Disorder Labs: Lab Results  Component Value Date   HGBA1C 4.8 05/09/2018   MPG 91 05/09/2018   MPG 111 10/22/2017   No results found for: PROLACTIN Lab Results  Component Value Date   CHOL 168 11/08/2018   TRIG 101 11/08/2018   HDL 54 11/08/2018   CHOLHDL 3.1 11/08/2018   VLDL 14 10/16/2016   LDLCALC 95 11/08/2018   LDLCALC 58 10/22/2017   Lab Results  Component Value Date   TSH 1.92 03/22/2018   TSH 3.42 10/22/2017    Therapeutic Level Labs: No results found for: LITHIUM No results found for: VALPROATE No components found for:   CBMZ  Current Medications: Current Outpatient Medications  Medication Sig Dispense Refill  . Cholecalciferol (VITAMIN D3) 1000 units CAPS Take 1,000 Int'l Units by mouth daily.     . citalopram (CELEXA) 40 MG tablet Take 1 tablet (40 mg total) by mouth daily. 90 tablet 1  . EPINEPHrine 0.3 mg/0.3 mL IJ SOAJ injection INJECT 0.3 MLS INTO THE MUSCLE ONCE FOR 1 DOSE. FOR LIFE-THREATENING ALLERGIC REACTION / ANAPHYLAXIS  1  . etonogestrel-ethinyl estradiol (NUVARING) 0.12-0.015  MG/24HR vaginal ring Place 1 each vaginally every 28 (twenty-eight) days. Insert vaginally and leave in place for 3 consecutive weeks, then remove for 1 week. 3 each 0  . etonogestrel-ethinyl estradiol (NUVARING) 0.12-0.015 MG/24HR vaginal ring Insert vaginally and leave in place for 3 consecutive weeks, then remove for 1 week. 3 each 3  . lamoTRIgine (LAMICTAL) 100 MG tablet Take 1 tablet (100 mg total) by mouth daily. 90 tablet 0  . lamoTRIgine (LAMICTAL) 25 MG tablet Take 1 tablet (25 mg total) by mouth daily. To be combined with Lamictal 100 mg 90 tablet 0  . levothyroxine (SYNTHROID, LEVOTHROID) 50 MCG tablet TAKE 1 TABLET (50 MCG TOTAL) BY MOUTH DAILY BEFORE BREAKFAST. 90 tablet 3  . Luliconazole 1 % CREA     . metFORMIN (GLUCOPHAGE-XR) 500 MG 24 hr tablet TAKE 3 TABLETS (1,500 MG TOTAL) BY MOUTH DAILY. 270 tablet 1  . Multiple Vitamins-Minerals (MULTIVITAMIN WITH MINERALS) tablet Take 1 tablet by mouth daily.    Marland Kitchen nystatin-triamcinolone ointment (MYCOLOG) Apply 1 application topically 2 (two) times daily. X 14 days 30 g 2  . polyethylene glycol (MIRALAX / GLYCOLAX) packet Take 17 g by mouth as needed.     Marland Kitchen QUEtiapine (SEROQUEL) 25 MG tablet TAKE 1 TABLET (25 MG TOTAL) BY MOUTH AT BEDTIME. FOR MOOD AND SLEEP 90 tablet 1  . terbinafine (LAMISIL) 1 % cream Apply 1 application topically 2 (two) times daily. 30 g 0  . terbinafine (LAMISIL) 250 MG tablet Take 250 mg by mouth daily.    . traZODone (DESYREL) 50 MG tablet  TAKE 1 AND 1/2 TO 2 TABLETS (75-100 MG TOTAL) BY MOUTH AT BEDTIME AS NEEDED FOR SLEEP. FOR SLEEP 180 tablet 1   No current facility-administered medications for this visit.      Musculoskeletal: Strength & Muscle Tone: UTA Gait & Station: Reports as WNL Patient leans: N/A  Psychiatric Specialty Exam: Review of Systems  Psychiatric/Behavioral: The patient is nervous/anxious.   All other systems reviewed and are negative.   There were no vitals taken for this visit.There is no height or weight on file to calculate BMI.  General Appearance: UTA  Eye Contact:  UTA  Speech:  Clear and Coherent  Volume:  Normal  Mood:  Anxious  Affect:  UTA  Thought Process:  Goal Directed and Descriptions of Associations: Intact  Orientation:  Full (Time, Place, and Person)  Thought Content: Logical   Suicidal Thoughts:  No  Homicidal Thoughts:  No  Memory:  Immediate;   Fair Recent;   Fair Remote;   Fair  Judgement:  Fair  Insight:  Fair  Psychomotor Activity:  UTA  Concentration:  Concentration: Fair and Attention Span: Fair  Recall:  [name] of Knowledge: Fair  Language: Fair  Akathisia:  No  Handed:  Right  AIMS (if indicated): Denies tremors, rigidity  Assets:  Communication Skills Desire for Improvement Housing Intimacy Social Support Talents/Skills  ADL's:  Intact  Cognition: WNL  Sleep:  Fair   Screenings: PHQ2-9     Office Visit from 11/08/2018 in [facility] Nutrition from 08/26/2018 in [facility] Office Visit from 05/09/2018 in [facility] Office Visit from 03/22/2018 in [facility] Office Visit from 02/08/2018 in [facility]  PHQ-2 Total Score  0  2  3  0  0  PHQ-9 Total Score  0  9  8  0  0  Assessment and Plan: Averianna is a 43 year old African-American female, married, employed, lives in [place], has a history of bipolar disorder, sleep problems, OCD, hypothyroidism  was evaluated by telemedicine today.  She is biologically predisposed given her family history of mental health problems as well as history of trauma.  She also has current stressors of COVID-19 outbreak.  Patient continues to do well on current medications.  Plan as noted below.  Plan Bipolar disorder type II-stable Lamotrigine 125 mg p.o. daily Celexa 40 mg p.o. daily Seroquel 25 mg p.o. nightly  OCD-stable Continue psychotherapy with Dr. Clarisa Kindred Celexa 40 mg p.o. daily  Insomnia-stable Seroquel 25 mg p.o. nightly Trazodone as needed  Some time was spent providing supportive psychotherapy.  Discussed healthy eating habits.  Discussed moderate exercise 4-5 times a week.  Follow-up in clinic in 1-2 months or sooner if needed.  November 25 at 2:20 PM  I have spent atleast 15 minutes non  face to face with patient today. More than 50 % of the time was spent for psychoeducation and supportive psychotherapy and care coordination. This note was generated in part or whole with voice recognition software. Voice recognition is usually quite accurate but there are transcription errors that can and very often do occur. I apologize for any typographical errors that were not detected and corrected.       Jomarie Longs, MD 01/01/2019, 5:24 PM

## 2019-01-16 ENCOUNTER — Other Ambulatory Visit: Payer: Self-pay | Admitting: Family Medicine

## 2019-02-12 ENCOUNTER — Ambulatory Visit (INDEPENDENT_AMBULATORY_CARE_PROVIDER_SITE_OTHER): Payer: BC Managed Care – PPO | Admitting: Psychiatry

## 2019-02-12 ENCOUNTER — Other Ambulatory Visit: Payer: Self-pay

## 2019-02-12 ENCOUNTER — Encounter: Payer: Self-pay | Admitting: Psychiatry

## 2019-02-12 DIAGNOSIS — F3176 Bipolar disorder, in full remission, most recent episode depressed: Secondary | ICD-10-CM | POA: Diagnosis not present

## 2019-02-12 DIAGNOSIS — F429 Obsessive-compulsive disorder, unspecified: Secondary | ICD-10-CM

## 2019-02-12 DIAGNOSIS — F5105 Insomnia due to other mental disorder: Secondary | ICD-10-CM | POA: Diagnosis not present

## 2019-02-12 NOTE — Progress Notes (Signed)
Virtual Visit via Video Note  I connected with Kathryn Munoz on 02/12/19 at  2:20 PM EST by a video enabled telemedicine application and verified that I am speaking with the correct person using two identifiers.   I discussed the limitations of evaluation and management by telemedicine and the availability of in person appointments. The patient expressed understanding and agreed to proceed.    I discussed the assessment and treatment plan with the patient. The patient was provided an opportunity to ask questions and all were answered. The patient agreed with the plan and demonstrated an understanding of the instructions.   The patient was advised to call back or seek an in-person evaluation if the symptoms worsen or if the condition fails to improve as anticipated.   BH MD OP Progress Note  02/12/2019 2:36 PM Kathryn Munoz  MRN:  [id number]  Chief Complaint:  Chief Complaint    Follow-up     HPI: Kathryn Munoz is a 43 year old African-American female, lives in [place], has a history of bipolar disorder, insomnia, OCD, insulin resistance, hypothyroidism was evaluated by telemedicine today.  Patient today reports she is currently doing well with regards to her mood symptoms.  Patient denies any significant anxiety symptoms at this time.  She reports sleep is good.  She continues to be concerned about her increased appetite and weight gain.  She believes it is because she is bored.  Discussed with patient again that Seroquel could be also contributing to the same.  Since her symptoms are currently stable recommended reducing the dosage of Seroquel and using it as needed on and off and coming off of it if she tolerates it okay.  Patient reports she has not been following up with her therapist anymore since her therapist is retiring.  She is planning to find someone new.  Her therapist will be giving her referrals.  Patient plans to spend her Thanksgiving with family.  Patient denies any  other concerns today.   Visit Diagnosis:    ICD-10-CM   1. Bipolar disorder, in full remission, most recent episode depressed (HCC)  F31.76   2. Insomnia due to mental condition  F51.05    stable  3. 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  F42.9     Past Psychiatric History: I have reviewed past psychiatric history from my progress note on 11/08/2017.  Past trials of Celexa.  Patient continues to work with her therapist-Dr. Clarisa Kindredynthia Herlong.  Past Medical History:  Past Medical History:  Diagnosis Date  . Abnormal mammogram    breast biopsy, PASH 2014  . Anxiety   . Bipolar 2 disorder (HCC)   . Depression   . Dermatitis   . History of gestational diabetes   . History of kidney stones 2014  . Hypothyroidism   . IFG (impaired fasting glucose)   . Insulin resistance   . Irregular periods   . Neuropathy   . Obesity   . OCD (obsessive compulsive disorder)   . Vitamin D deficiency disease     Past Surgical History:  Procedure Laterality Date  . BREAST BIOPSY Left 2014   us bx/clip-neg  . INTRAUTERINE DEVICE INSERTION  03/11/2012    Family Psychiatric History: I have reviewed family psychiatric history from my progress note on 11/08/2017  Family History:  Family History  Problem Relation Age of Onset  . Diabetes Mother   . Pancreatitis Mother   . Hypertension Mother   . Alcohol abuse Mother   . Schizophrenia Mother   .  Depression Mother   . Diabetes Sister        pre-diabetic  . Diabetes Brother   . Drug abuse Brother   . ADD / ADHD Son   . Eczema Son   . Breast cancer Cousin        pat cousin  . Anxiety disorder Other   . Ovarian cancer Neg Hx     Social History: Reviewed social history from my progress note on 11/08/2017 Social History   Socioeconomic History  . Marital status: Married    Spouse name: jeffrey  . Number of children: 2  . Years of education: Not on file  . Highest education level: Bachelor's degree (e.g., BA, AB, BS)   Occupational History  . Not on file  Social Needs  . Financial resource strain: Not hard at all  . Food insecurity    Worry: Never true    Inability: Never true  . Transportation needs    Medical: No    Non-medical: No  Tobacco Use  . Smoking status: Never Smoker  . Smokeless tobacco: Never Used  Substance and Sexual Activity  . Alcohol use: No  . Drug use: No  . Sexual activity: Yes    Partners: Male    Birth control/protection: Other-see comments    Comment: nuvaring  Lifestyle  . Physical activity    Days per week: 0 days    Minutes per session: 0 min  . Stress: Very much  Relationships  . Social Musician on phone: Never    Gets together: Once a week    Attends religious service: More than 4 times per year    Active member of club or organization: Yes    Attends meetings of clubs or organizations: More than 4 times per year    Relationship status: Married  Other Topics Concern  . Not on file  Social History Narrative  . Not on file    Allergies:  Allergies  Allergen Reactions  . Date Seed Extract  [Zizyphus Jujuba] Anaphylaxis, Cough, Itching and Swelling    Metabolic Disorder Labs: Lab Results  Component Value Date   HGBA1C 4.8 05/09/2018   MPG 91 05/09/2018   MPG 111 10/22/2017   No results found for: PROLACTIN Lab Results  Component Value Date   CHOL 168 11/08/2018   TRIG 101 11/08/2018   HDL 54 11/08/2018   CHOLHDL 3.1 11/08/2018   VLDL 14 10/16/2016   LDLCALC 95 11/08/2018   LDLCALC 58 10/22/2017   Lab Results  Component Value Date   TSH 1.92 03/22/2018   TSH 3.42 10/22/2017    Therapeutic Level Labs: No results found for: LITHIUM No results found for: VALPROATE No components found for:  CBMZ  Current Medications: Current Outpatient Medications  Medication Sig Dispense Refill  . Cholecalciferol (VITAMIN D3) 1000 units CAPS Take 1,000 Int'l Units by mouth daily.     . citalopram (CELEXA) 40 MG tablet Take 1 tablet (40  mg total) by mouth daily. 90 tablet 1  . EPINEPHrine 0.3 mg/0.3 mL IJ SOAJ injection INJECT 0.3 MLS INTO THE MUSCLE ONCE FOR 1 DOSE. FOR LIFE-THREATENING ALLERGIC REACTION / ANAPHYLAXIS  1  . etonogestrel-ethinyl estradiol (NUVARING) 0.12-0.015 MG/24HR vaginal ring Place 1 each vaginally every 28 (twenty-eight) days. Insert vaginally and leave in place for 3 consecutive weeks, then remove for 1 week. 3 each 0  . etonogestrel-ethinyl estradiol (NUVARING) 0.12-0.015 MG/24HR vaginal ring Insert vaginally and leave in place for 3 consecutive weeks,  then remove for 1 week. 3 each 3  . lamoTRIgine (LAMICTAL) 100 MG tablet Take 1 tablet (100 mg total) by mouth daily. 90 tablet 0  . lamoTRIgine (LAMICTAL) 25 MG tablet Take 1 tablet (25 mg total) by mouth daily. To be combined with Lamictal 100 mg 90 tablet 0  . levothyroxine (SYNTHROID, LEVOTHROID) 50 MCG tablet TAKE 1 TABLET (50 MCG TOTAL) BY MOUTH DAILY BEFORE BREAKFAST. 90 tablet 3  . Luliconazole 1 % CREA     . metFORMIN (GLUCOPHAGE-XR) 500 MG 24 hr tablet TAKE 3 TABLETS (1,500 MG TOTAL) BY MOUTH DAILY. 270 tablet 1  . Multiple Vitamins-Minerals (MULTIVITAMIN WITH MINERALS) tablet Take 1 tablet by mouth daily.    Marland Kitchen nystatin-triamcinolone ointment (MYCOLOG) Apply 1 application topically 2 (two) times daily. X 14 days 30 g 2  . polyethylene glycol (MIRALAX / GLYCOLAX) packet Take 17 g by mouth as needed.     Marland Kitchen QUEtiapine (SEROQUEL) 25 MG tablet TAKE 1 TABLET (25 MG TOTAL) BY MOUTH AT BEDTIME. FOR MOOD AND SLEEP 90 tablet 1  . terbinafine (LAMISIL) 1 % cream Apply 1 application topically 2 (two) times daily. 30 g 0  . terbinafine (LAMISIL) 250 MG tablet Take 250 mg by mouth daily.    . traZODone (DESYREL) 50 MG tablet TAKE 1 AND 1/2 TO 2 TABLETS (75-100 MG TOTAL) BY MOUTH AT BEDTIME AS NEEDED FOR SLEEP. FOR SLEEP 180 tablet 1   No current facility-administered medications for this visit.      Musculoskeletal: Strength & Muscle Tone: UTA Gait &  Station: normal Patient leans: N/A  Psychiatric Specialty Exam: Review of Systems  Constitutional:       Weight gain  Psychiatric/Behavioral: Negative for depression, hallucinations, substance abuse and suicidal ideas. The patient is not nervous/anxious and does not have insomnia.   All other systems reviewed and are negative.   There were no vitals taken for this visit.There is no height or weight on file to calculate BMI.  General Appearance: Casual  Eye Contact:  Fair  Speech:  Normal Rate  Volume:  Normal  Mood:  Euthymic  Affect:  Congruent  Thought Process:  Goal Directed and Descriptions of Associations: Intact  Orientation:  Full (Time, Place, and Person)  Thought Content: Logical   Suicidal Thoughts:  No  Homicidal Thoughts:  No  Memory:  Immediate;   Fair Recent;   Fair Remote;   Fair  Judgement:  Fair  Insight:  Fair  Psychomotor Activity:  Normal  Concentration:  Concentration: Fair and Attention Span: Fair  Recall:  [employer] of Knowledge: Fair  Language: Fair  Akathisia:  No  Handed:  Right  AIMS (if indicated): denies tremors, rigidity  Assets:  Communication Skills Desire for [place] Talents/Skills Transportation Vocational/Educational  ADL's:  Intact  Cognition: WNL  Sleep:  Fair   Screenings: PHQ2-9     Office Visit from 11/08/2018 in [facility] Nutrition from 08/26/2018 in [place] Office Visit from 05/09/2018 in [facility] Office Visit from 03/22/2018 in [facility] Office Visit from 02/08/2018 in [facility]  PHQ-2 Total Score  0  2  3  0  0  PHQ-9 Total Score  0  9  8  0  0       Assessment and Plan: Kathryn Munoz is a 43 year old African-American female, married, employed, lives in [place], has a history of bipolar disorder, sleep problems, OCD, hypothyroidism  was evaluated by telemedicine today.  Patient is  biologically predisposed given her family history of mental health problems as well as history of trauma.  Patient also has current stressors of COVID-19 outbreak.  Patient is currently doing well on the current medication regimen although she struggles with  appetite increase and weight gain.  Plan as noted below.  Plan Bipolar disorder-in remission Lamotrigine 125 mg p.o. daily Celexa 40 mg p.o. daily Discussed tapering off Seroquel.  Patient will try to stop taking it and monitor her symptoms closely.  OCD-stable Continue psychotherapy sessions-her therapist will make the referral to other therapists in the community since she is retiring. Celexa 40 mg p.o. daily  Insomnia-stable Since she is coming off of Seroquel advised taking melatonin over-the-counter as needed. Trazodone as needed  Follow-up in clinic in 1 month or sooner if needed.  December 28 at 11:30 AM  I have spent atleast 15 minutes non face to face with patient today. More than 50 % of the time was spent for psychoeducation and supportive psychotherapy and care coordination. This note was generated in part or whole with voice recognition software. Voice recognition is usually quite accurate but there are transcription errors that can and very often do occur. I apologize for any typographical errors that were not detected and corrected.       Jomarie Longs, MD 02/12/2019, 2:36 PM

## 2019-03-11 ENCOUNTER — Ambulatory Visit: Payer: BC Managed Care – PPO | Admitting: Family Medicine

## 2019-03-12 ENCOUNTER — Other Ambulatory Visit: Payer: Self-pay | Admitting: Psychiatry

## 2019-03-12 DIAGNOSIS — F5105 Insomnia due to other mental disorder: Secondary | ICD-10-CM

## 2019-03-12 DIAGNOSIS — F3181 Bipolar II disorder: Secondary | ICD-10-CM

## 2019-03-17 ENCOUNTER — Ambulatory Visit: Payer: BC Managed Care – PPO | Admitting: Psychiatry

## 2019-03-24 ENCOUNTER — Ambulatory Visit (INDEPENDENT_AMBULATORY_CARE_PROVIDER_SITE_OTHER): Payer: BC Managed Care – PPO | Admitting: Family Medicine

## 2019-03-24 ENCOUNTER — Encounter: Payer: Self-pay | Admitting: Family Medicine

## 2019-03-24 VITALS — Ht 65.0 in | Wt 258.0 lb

## 2019-03-24 DIAGNOSIS — F331 Major depressive disorder, recurrent, moderate: Secondary | ICD-10-CM

## 2019-03-24 DIAGNOSIS — E039 Hypothyroidism, unspecified: Secondary | ICD-10-CM | POA: Diagnosis not present

## 2019-03-24 DIAGNOSIS — E8881 Metabolic syndrome: Secondary | ICD-10-CM | POA: Diagnosis not present

## 2019-03-24 DIAGNOSIS — E559 Vitamin D deficiency, unspecified: Secondary | ICD-10-CM | POA: Diagnosis not present

## 2019-03-24 DIAGNOSIS — Z5181 Encounter for therapeutic drug level monitoring: Secondary | ICD-10-CM

## 2019-03-24 DIAGNOSIS — Z6841 Body Mass Index (BMI) 40.0 and over, adult: Secondary | ICD-10-CM

## 2019-03-24 DIAGNOSIS — R7989 Other specified abnormal findings of blood chemistry: Secondary | ICD-10-CM

## 2019-03-24 DIAGNOSIS — F3181 Bipolar II disorder: Secondary | ICD-10-CM

## 2019-03-24 NOTE — Progress Notes (Signed)
Name: Kathryn Munoz   MRN: [id number]    DOB: February 28, 1976   Date:03/24/2019       Progress Note  Subjective:    Chief Complaint  Chief Complaint  Patient presents with  . Follow-up  . Tinea Pedis    already seen dermatologist    I connected with  Myra Rude  on 03/24/19 at  3:20 PM EST by a video enabled telemedicine application and verified that I am speaking with the correct person using two identifiers.  I discussed the limitations of evaluation and management by telemedicine and the availability of in person appointments. The patient expressed understanding and agreed to proceed. Staff also discussed with the patient that there may be a patient responsible charge related to this service. Patient Location: home Provider Location: [facility] Additional Individuals present: none  HPI  Hypothyroidism: Current Medication Regimen: 50 mcg synthroid Takes medicine in the morning before breakfast Current Symptoms: denies fatigue, heat/cold intolerance, bowel/skin changes or CVS symptoms She is having weight gain, possibly from med SE of seroquel but also pt thinks from being at home a lot and eating more.  Her psychiatrist wants her to try and hold seroquel and take melatonin for sleep to see if her appetite will decrease or pt can stop gaining weight. Most recent results are below; we will be repeating labs today. Lab Results  Component Value Date   TSH 1.92 03/22/2018   T4TOTAL 11.1 03/21/2017   Wt Readings from Last 5 Encounters:  03/24/19 258 lb (117 kg)  11/29/18 256 lb 6.4 oz (116.3 kg)  11/08/18 256 lb 6.4 oz (116.3 kg)  08/26/18 252 lb 9.6 oz (114.6 kg)  05/09/18 250 lb 1.6 oz (113.4 kg)   BMI Readings from Last 5 Encounters:  03/24/19 42.93 kg/m  11/29/18 42.67 kg/m  11/08/18 42.67 kg/m  08/26/18 42.03 kg/m  05/09/18 41.62 kg/m     Obesity - morbid obesity On Seroquel - may be increasing her appetite, but she's also  She has gone to a dietician  before.  She has not gone to weight management  She has elevated insulin, elevated blood sugar and last A1C was normal.       Patient Active Problem List   Diagnosis Date Noted  . 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 01/01/2019  . Bipolar 2 disorder, major depressive episode (HCC) 09/05/2018  . Insomnia due to mental condition 09/05/2018  . Morbid obesity (HCC) 05/09/2018  . Metabolic syndrome 10/27/2017  . Acanthosis nigricans 10/22/2017  . Moderate episode of recurrent major depressive disorder (HCC) 10/22/2017  . Acid reflux 04/01/2017  . Anxiety 06/01/2016  . Morbid obesity with BMI of 40.0-44.9, adult (HCC) 03/12/2015  . Peripheral neuropathy 10/09/2014  . Hypothyroidism   . IFG (impaired fasting glucose)   . Vitamin D deficiency   . Neuropathy     Social History   Tobacco Use  . Smoking status: Never Smoker  . Smokeless tobacco: Never Used  Substance Use Topics  . Alcohol use: No     Current Outpatient Medications:  .  citalopram (CELEXA) 40 MG tablet, Take 1 tablet (40 mg total) by mouth daily., Disp: 90 tablet, Rfl: 1 .  EPINEPHrine 0.3 mg/0.3 mL IJ SOAJ injection, INJECT 0.3 MLS INTO THE MUSCLE ONCE FOR 1 DOSE. FOR LIFE-THREATENING ALLERGIC REACTION / ANAPHYLAXIS, Disp: , Rfl: 1 .  etonogestrel-ethinyl estradiol (NUVARING) 0.12-0.015 MG/24HR vaginal ring, Place 1 each vaginally every 28 (twenty-eight) days. Insert vaginally and leave in  place for 3 consecutive weeks, then remove for 1 week., Disp: 3 each, Rfl: 0 .  etonogestrel-ethinyl estradiol (NUVARING) 0.12-0.015 MG/24HR vaginal ring, Insert vaginally and leave in place for 3 consecutive weeks, then remove for 1 week., Disp: 3 each, Rfl: 3 .  lamoTRIgine (LAMICTAL) 100 MG tablet, Take 1 tablet (100 mg total) by mouth daily., Disp: 90 tablet, Rfl: 0 .  lamoTRIgine (LAMICTAL) 25 MG tablet, Take 1 tablet (25 mg total) by mouth daily. To be combined with Lamictal 100 mg, Disp: 90 tablet, Rfl: 0 .   levothyroxine (SYNTHROID, LEVOTHROID) 50 MCG tablet, TAKE 1 TABLET (50 MCG TOTAL) BY MOUTH DAILY BEFORE BREAKFAST., Disp: 90 tablet, Rfl: 3 .  Luliconazole 1 % CREA, , Disp: , Rfl:  .  metFORMIN (GLUCOPHAGE-XR) 500 MG 24 hr tablet, TAKE 3 TABLETS (1,500 MG TOTAL) BY MOUTH DAILY., Disp: 270 tablet, Rfl: 1 .  Multiple Vitamins-Minerals (MULTIVITAMIN WITH MINERALS) tablet, Take 1 tablet by mouth daily., Disp: , Rfl:  .  nystatin-triamcinolone ointment (MYCOLOG), Apply 1 application topically 2 (two) times daily. X 14 days, Disp: 30 g, Rfl: 2 .  polyethylene glycol (MIRALAX / GLYCOLAX) packet, Take 17 g by mouth as needed. , Disp: , Rfl:  .  QUEtiapine (SEROQUEL) 25 MG tablet, TAKE 1 TABLET (25 MG TOTAL) BY MOUTH AT BEDTIME. FOR MOOD AND SLEEP, Disp: 90 tablet, Rfl: 1 .  terbinafine (LAMISIL) 1 % cream, Apply 1 application topically 2 (two) times daily., Disp: 30 g, Rfl: 0 .  terbinafine (LAMISIL) 250 MG tablet, Take 250 mg by mouth daily., Disp: , Rfl:  .  traZODone (DESYREL) 50 MG tablet, TAKE 1 AND 1/2 TO 2 TABLETS (75-100 MG TOTAL) BY MOUTH AT BEDTIME AS NEEDED FOR SLEEP. FOR SLEEP, Disp: 180 tablet, Rfl: 1  Allergies  Allergen Reactions  . Date Seed Extract  [Zizyphus Jujuba] Anaphylaxis, Cough, Itching and Swelling   I personally reviewed active problem list, medication list, allergies, family history, social history, health maintenance, notes from last encounter, lab results, imaging with the patient/caregiver today. Pt new to me, reviewed OV at [facility], psychiatry, OBGYN and nutritionist over the past year as well as all labs over the past couple years.      Objective:   Virtual encounter, vitals limited, only able to obtain the following Today's Vitals   03/24/19 1551  Weight: 258 lb (117 kg)  Height: 5\' 5"  (1.651 m)   Body mass index is 42.93 kg/m. Nursing Note and Vital Signs reviewed.  Physical Exam Vitals and nursing note reviewed.  Constitutional:      General: She is not in  acute distress.    Appearance: Normal appearance. She is well-developed. She is not ill-appearing, toxic-appearing or diaphoretic.  HENT:     Head: Normocephalic and atraumatic.  Eyes:     General:        Right eye: No discharge.        Left eye: No discharge.     Conjunctiva/sclera: Conjunctivae normal.  Neck:     Trachea: No tracheal deviation.  Cardiovascular:     Rate and Rhythm: Normal rate.  Pulmonary:     Effort: Pulmonary effort is normal. No respiratory distress.     Breath sounds: No stridor.  Skin:    Coloration: Skin is not jaundiced or pale.     Findings: No rash.  Neurological:     Mental Status: She is alert.  Psychiatric:        Mood and Affect: Mood normal.  Behavior: Behavior normal.     PE limited by telephone encounter  No results found for this or any previous visit (from the past 72 hour(s)).  Assessment and Plan:   1. Insulin resistance Known hx, recheck labs - CMP w GFR - Lipid Panel - A1C  2. Hypothyroidism, unspecified type Recheck labs, some weight gain, otherwise no sx or concerns - CBC w/ Diff - TSH  3. Morbid obesity with BMI of 40.0-44.9, adult (HCC) Encouraged diet and exercise for health and calorie deficit for weight loss - Lipid Panel - A1C - TSH  4. Metabolic syndrome Suspect with hx, pt may qualify for meds with indication for weight loss, would treat insulin resistance and other developed comorbidities - CMP w GFR - CBC w/ Diff - Lipid Panel - A1C - TSH  5. Vitamin D deficiency Recheck level - Vit D  6. Bipolar 2 disorder, major depressive episode (HCC) Managed by psych, feels she is currently well controlled and mood good  7. Moderate episode of recurrent major depressive disorder (HCC) See above  8. Encounter for medication monitoring - CMP w GFR - CBC w/ Diff - Lipid Panel - A1C - TSH    I provided 16 minutes of non-face-to-face time during this encounter.  Danelle Berry, PA-C 03/24/19 3:56 PM

## 2019-03-31 ENCOUNTER — Ambulatory Visit: Payer: BC Managed Care – PPO | Admitting: Psychiatry

## 2019-04-01 ENCOUNTER — Other Ambulatory Visit: Payer: Self-pay | Admitting: Psychiatry

## 2019-04-01 DIAGNOSIS — F3181 Bipolar II disorder: Secondary | ICD-10-CM

## 2019-04-01 LAB — COMPLETE METABOLIC PANEL WITH GFR
AG Ratio: 1.3 (calc) (ref 1.0–2.5)
ALT: 24 U/L (ref 6–29)
AST: 35 U/L — ABNORMAL HIGH (ref 10–30)
Albumin: 4.2 g/dL (ref 3.6–5.1)
Alkaline phosphatase (APISO): 52 U/L (ref 31–125)
BUN: 12 mg/dL (ref 7–25)
CO2: 24 mmol/L (ref 20–32)
Calcium: 9.7 mg/dL (ref 8.6–10.2)
Chloride: 103 mmol/L (ref 98–110)
Creat: 0.89 mg/dL (ref 0.50–1.10)
GFR, Est African American: 93 mL/min/{1.73_m2} (ref >=60)
GFR, Est Non African American: 81 mL/min/{1.73_m2} (ref >=60)
Globulin: 3.2 g/dL (calc) (ref 1.9–3.7)
Glucose, Bld: 159 mg/dL — ABNORMAL HIGH (ref 65–139)
Potassium: 4.4 mmol/L (ref 3.5–5.3)
Sodium: 138 mmol/L (ref 135–146)
Total Bilirubin: 0.6 mg/dL (ref 0.2–1.2)
Total Protein: 7.4 g/dL (ref 6.1–8.1)

## 2019-04-01 LAB — TSH: TSH: 8.4 mIU/L — ABNORMAL HIGH

## 2019-04-01 LAB — CBC WITH DIFFERENTIAL/PLATELET
Absolute Monocytes: 323 cells/uL (ref 200–950)
Basophils Absolute: 68 cells/uL (ref 0–200)
Basophils Relative: 0.9 %
Eosinophils Absolute: 158 cells/uL (ref 15–500)
Eosinophils Relative: 2.1 %
HCT: 39 % (ref 35.0–45.0)
Hemoglobin: 12.7 g/dL (ref 11.7–15.5)
Lymphs Abs: 2745 cells/uL (ref 850–3900)
MCH: 30.9 pg (ref 27.0–33.0)
MCHC: 32.6 g/dL (ref 32.0–36.0)
MCV: 94.9 fL (ref 80.0–100.0)
MPV: 9.7 fL (ref 7.5–12.5)
Monocytes Relative: 4.3 %
Neutro Abs: 4208 cells/uL (ref 1500–7800)
Neutrophils Relative %: 56.1 %
Platelets: 305 10*3/uL (ref 140–400)
RBC: 4.11 10*6/uL (ref 3.80–5.10)
RDW: 10.8 % — ABNORMAL LOW (ref 11.0–15.0)
Total Lymphocyte: 36.6 %
WBC: 7.5 10*3/uL (ref 3.8–10.8)

## 2019-04-01 LAB — LIPID PANEL
Cholesterol: 186 mg/dL (ref <200)
HDL: 50 mg/dL (ref >=50)
LDL Cholesterol (Calc): 110 mg/dL (calc) — ABNORMAL HIGH
Non-HDL Cholesterol (Calc): 136 mg/dL (calc) — ABNORMAL HIGH (ref <130)
Total CHOL/HDL Ratio: 3.7 (calc) (ref <5.0)
Triglycerides: 148 mg/dL (ref <150)

## 2019-04-01 LAB — HEMOGLOBIN A1C
Hgb A1c MFr Bld: 5.7 % of total Hgb — ABNORMAL HIGH (ref <5.7)
Mean Plasma Glucose: 117 (calc)
eAG (mmol/L): 6.5 (calc)

## 2019-04-01 LAB — VITAMIN D 25 HYDROXY (VIT D DEFICIENCY, FRACTURES): Vit D, 25-Hydroxy: 18 ng/mL — ABNORMAL LOW (ref 30–100)

## 2019-04-04 NOTE — Progress Notes (Signed)
See result notes Pt referred to medical weight management I am titration up thyroid dose and will recheck pt TSH in 6 weeks  Worse thyroid, A1C, weight possibly some medication SE contributing, has done dietician referral before - willing to try med weight management   1. Insulin resistance Known - Amb Ref to Medical Weight Management  2. Hypothyroidism, unspecified type Chemical hypothyroid, dose increase 6 week recheck  3. Morbid obesity with BMI of 40.0-44.9, adult (HCC) - Lipid Panel - A1C - TSH - Amb Ref to Medical Weight Management  4. Metabolic syndrome - CMP w GFR - CBC w/ Diff - Lipid Panel - A1C - TSH - Amb Ref to Medical Weight Management   5. High serum thyroid stimulating hormone (TSH) - TSH - 6 week recheck after dose increase

## 2019-04-04 NOTE — Addendum Note (Signed)
Addended by: Danelle Berry on: 04/04/2019 10:59 AM   Modules accepted: Orders

## 2019-05-19 ENCOUNTER — Other Ambulatory Visit: Payer: Self-pay | Admitting: Family Medicine

## 2019-06-26 ENCOUNTER — Other Ambulatory Visit: Payer: Self-pay | Admitting: Psychiatry

## 2019-06-26 DIAGNOSIS — F3181 Bipolar II disorder: Secondary | ICD-10-CM

## 2019-06-28 DIAGNOSIS — N920 Excessive and frequent menstruation with regular cycle: Secondary | ICD-10-CM | POA: Insufficient documentation

## 2019-07-10 ENCOUNTER — Other Ambulatory Visit: Payer: Self-pay

## 2019-07-10 ENCOUNTER — Ambulatory Visit (INDEPENDENT_AMBULATORY_CARE_PROVIDER_SITE_OTHER): Payer: BC Managed Care – PPO | Admitting: Obstetrics and Gynecology

## 2019-07-10 ENCOUNTER — Encounter: Payer: Self-pay | Admitting: Obstetrics and Gynecology

## 2019-07-10 VITALS — BP 141/91 | HR 80 | Ht 65.0 in | Wt 254.0 lb

## 2019-07-10 DIAGNOSIS — Z975 Presence of (intrauterine) contraceptive device: Secondary | ICD-10-CM

## 2019-07-10 DIAGNOSIS — N938 Other specified abnormal uterine and vaginal bleeding: Secondary | ICD-10-CM | POA: Diagnosis not present

## 2019-07-10 DIAGNOSIS — N921 Excessive and frequent menstruation with irregular cycle: Secondary | ICD-10-CM | POA: Diagnosis not present

## 2019-07-10 MED ORDER — NORETHINDRONE ACETATE 5 MG PO TABS: 5.0000 mg | ORAL_TABLET | Freq: Every day | ORAL | 0 refills | Status: DC

## 2019-07-10 NOTE — Progress Notes (Signed)
HPI:      [name] is a 44 y.o. [phone] who LMP was No LMP recorded (lmp unknown). (Menstrual status: Other).  Subjective:   She presents today complaining of very heavy bleeding for the last 2 weeks despite use of NuvaRing.  Patient uses NuvaRing 3 weeks and 1 week out.  This is the first time she has had any issues with bleeding.  Denies postcoital bleeding.  She states that she continues to bleed heavily even today. Of significant note her mother passed in March and she has been under "quite a bit of stress since that time".    Hx: The following portions of the patient's history were reviewed and updated as appropriate:             She  has a past medical history of Abnormal mammogram, Anxiety, Bipolar 2 disorder (HCC), Depression, Dermatitis, History of gestational diabetes, History of kidney stones (2014), Hypothyroidism, IFG (impaired fasting glucose), Insulin resistance, Irregular periods, Neuropathy, Obesity, OCD (obsessive compulsive disorder), and Vitamin D deficiency disease. She does not have any pertinent problems on file. She  has a past surgical history that includes Intrauterine device insertion (03/11/2012) and Breast biopsy (Left, 2014). Her family history includes ADD / ADHD in her son; Alcohol abuse in her mother; Anxiety disorder in an other family member; Breast cancer in her cousin; Depression in her mother; Diabetes in her brother, mother, and sister; Drug abuse in her brother; Eczema in her son; Hypertension in her mother; Pancreatitis in her mother; Schizophrenia in her mother. She  reports that she has never smoked. She has never used smokeless tobacco. She reports that she does not drink alcohol or use drugs. She has a current medication list which includes the following prescription(s): citalopram, epinephrine, etonogestrel-ethinyl estradiol, lamotrigine, lamotrigine, levothyroxine, luliconazole, metformin, polyethylene glycol, quetiapine, trazodone,  norethindrone, nystatin-triamcinolone ointment, terbinafine, and terbinafine. She is allergic to date seed extract  [zizyphus jujuba].       Review of Systems:  Review of Systems  Constitutional: Denied constitutional symptoms, night sweats, recent illness, fatigue, fever, insomnia and weight loss.  Eyes: Denied eye symptoms, eye pain, photophobia, vision change and visual disturbance.  Ears/Nose/Throat/Neck: Denied ear, nose, throat or neck symptoms, hearing loss, nasal discharge, sinus congestion and sore throat.  Cardiovascular: Denied cardiovascular symptoms, arrhythmia, chest pain/pressure, edema, exercise intolerance, orthopnea and palpitations.  Respiratory: Denied pulmonary symptoms, asthma, pleuritic pain, productive sputum, cough, dyspnea and wheezing.  Gastrointestinal: Denied, gastro-esophageal reflux, melena, nausea and vomiting.  Genitourinary: See HPI for additional information.  Musculoskeletal: Denied musculoskeletal symptoms, stiffness, swelling, muscle weakness and myalgia.  Dermatologic: Denied dermatology symptoms, rash and scar.  Neurologic: Denied neurology symptoms, dizziness, headache, neck pain and syncope.  Psychiatric: Denied psychiatric symptoms, anxiety and depression.  Endocrine: Denied endocrine symptoms including hot flashes and night sweats.   Meds:   Current Outpatient Medications on File Prior to Visit  Medication Sig Dispense Refill  . citalopram (CELEXA) 40 MG tablet Take 1 tablet (40 mg total) by mouth daily. 90 tablet 1  . EPINEPHrine 0.3 mg/0.3 mL IJ SOAJ injection INJECT 0.3 MLS INTO THE MUSCLE ONCE FOR 1 DOSE. FOR LIFE-THREATENING ALLERGIC REACTION / ANAPHYLAXIS  1  . etonogestrel-ethinyl estradiol (NUVARING) 0.12-0.015 MG/24HR vaginal ring Place 1 each vaginally every 28 (twenty-eight) days. Insert vaginally and leave in place for 3 consecutive weeks, then remove for 1 week. 3 each 0  . lamoTRIgine (LAMICTAL) 100 MG tablet TAKE 1 TABLET BY MOUTH  EVERY DAY 90  tablet 0  . lamoTRIgine (LAMICTAL) 25 MG tablet TAKE 1 TABLET (25 MG TOTAL) BY MOUTH DAILY. TO BE COMBINED WITH LAMICTAL 100 MG 90 tablet 0  . levothyroxine (SYNTHROID) 50 MCG tablet TAKE 1 TABLET BY MOUTH DAILY BEFORE BREAKFAST 90 tablet 1  . Luliconazole 1 % CREA     . metFORMIN (GLUCOPHAGE-XR) 500 MG 24 hr tablet TAKE 3 TABLETS (1,500 MG TOTAL) BY MOUTH DAILY. 270 tablet 1  . polyethylene glycol (MIRALAX / GLYCOLAX) packet Take 17 g by mouth as needed.     Marland Kitchen QUEtiapine (SEROQUEL) 25 MG tablet TAKE 1 TABLET (25 MG TOTAL) BY MOUTH AT BEDTIME. FOR MOOD AND SLEEP 90 tablet 1  . traZODone (DESYREL) 50 MG tablet TAKE 1 AND 1/2 TO 2 TABLETS (75-100 MG TOTAL) BY MOUTH AT BEDTIME AS NEEDED FOR SLEEP. FOR SLEEP 180 tablet 1  . nystatin-triamcinolone ointment (MYCOLOG) Apply 1 application topically 2 (two) times daily. X 14 days (Patient not taking: Reported on 07/10/2019) 30 g 2  . terbinafine (LAMISIL) 1 % cream Apply 1 application topically 2 (two) times daily. (Patient not taking: Reported on 07/10/2019) 30 g 0  . terbinafine (LAMISIL) 250 MG tablet Take 250 mg by mouth daily.     No current facility-administered medications on file prior to visit.    Objective:     Vitals:   07/10/19 0843  BP: (!) 141/91  Pulse: 80              Examination deferred because of heavy menstrual bleeding.  Assessment:    [id number] Patient Active Problem List   Diagnosis Date Noted  . 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 01/01/2019  . Bipolar 2 disorder, major depressive episode (HCC) 09/05/2018  . Insomnia due to mental condition 09/05/2018  . Morbid obesity (HCC) 05/09/2018  . Metabolic syndrome 10/27/2017  . Acanthosis nigricans 10/22/2017  . Moderate episode of recurrent major depressive disorder (HCC) 10/22/2017  . Acid reflux 04/01/2017  . Anxiety 06/01/2016  . Morbid obesity with BMI of 40.0-44.9, adult (HCC) 03/12/2015  . Peripheral neuropathy 10/09/2014  .  Hypothyroidism   . IFG (impaired fasting glucose)   . Vitamin D deficiency   . Neuropathy      1. Breakthrough bleeding with NuvaRing   2. DUB (dysfunctional uterine bleeding)        Plan:            1.  We will stop bleeding with Aygestin taper dose and then get patient back on appropriate cycling with NuvaRing.  If her irregular bleeding continues despite Aygestin and continuation of NuvaRing she will need an exam for endocervical polyps and likely an ultrasound.  She will contact us if her bleeding does not regulate.  Orders No orders of the defined types were placed in this encounter.    Meds ordered this encounter  Medications  . norethindrone (AYGESTIN) 5 MG tablet    Sig: Take 1 tablet (5 mg total) by mouth daily for 21 days. As directed taper dose :  3X 1 wk - 2X - 1wk  Then daily    Dispense:  60 tablet    Refill:  0      F/U  Return for Pt to contact us if symptoms worsen. I spent 23 minutes involved in the care of this patient preparing to see the patient by obtaining and reviewing her medical history (including labs, imaging tests and prior procedures), documenting clinical information in the electronic health record (EHR), counseling  and coordinating care plans, writing and sending prescriptions, ordering tests or procedures and directly communicating with the patient by discussing pertinent items from her history and physical exam as well as detailing my assessment and plan as noted above so that she has an informed understanding.  All of her questions were answered.  Finis Bud, M.D. 07/10/2019 9:33 AM

## 2019-07-17 ENCOUNTER — Encounter (INDEPENDENT_AMBULATORY_CARE_PROVIDER_SITE_OTHER): Payer: Self-pay | Admitting: Family Medicine

## 2019-07-17 ENCOUNTER — Other Ambulatory Visit: Payer: Self-pay

## 2019-07-17 ENCOUNTER — Ambulatory Visit (INDEPENDENT_AMBULATORY_CARE_PROVIDER_SITE_OTHER): Payer: BC Managed Care – PPO | Admitting: Family Medicine

## 2019-07-17 VITALS — BP 136/85 | HR 79 | Temp 98.8°F | Wt 252.0 lb

## 2019-07-17 DIAGNOSIS — Z6841 Body Mass Index (BMI) 40.0 and over, adult: Secondary | ICD-10-CM

## 2019-07-17 DIAGNOSIS — R0602 Shortness of breath: Secondary | ICD-10-CM | POA: Diagnosis not present

## 2019-07-17 DIAGNOSIS — Z1331 Encounter for screening for depression: Secondary | ICD-10-CM | POA: Diagnosis not present

## 2019-07-17 DIAGNOSIS — E038 Other specified hypothyroidism: Secondary | ICD-10-CM

## 2019-07-17 DIAGNOSIS — Z0289 Encounter for other administrative examinations: Secondary | ICD-10-CM

## 2019-07-17 DIAGNOSIS — R5383 Other fatigue: Secondary | ICD-10-CM

## 2019-07-17 DIAGNOSIS — E559 Vitamin D deficiency, unspecified: Secondary | ICD-10-CM | POA: Diagnosis not present

## 2019-07-17 DIAGNOSIS — R739 Hyperglycemia, unspecified: Secondary | ICD-10-CM

## 2019-07-17 DIAGNOSIS — F3181 Bipolar II disorder: Secondary | ICD-10-CM
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
## 2019-07-18 LAB — CBC WITH DIFFERENTIAL/PLATELET
Basophils Absolute: 0 10*3/uL (ref 0.0–0.2)
Basos: 1 %
EOS (ABSOLUTE): 0.1 10*3/uL (ref 0.0–0.4)
Eos: 2 %
Hematocrit: 38.6 % (ref 34.0–46.6)
Hemoglobin: 12.6 g/dL (ref 11.1–15.9)
Immature Grans (Abs): 0 10*3/uL (ref 0.0–0.1)
Immature Granulocytes: 0 %
Lymphocytes Absolute: 2.7 10*3/uL (ref 0.7–3.1)
Lymphs: 33 %
MCH: 32.1 pg (ref 26.6–33.0)
MCHC: 32.6 g/dL (ref 31.5–35.7)
MCV: 98 fL — ABNORMAL HIGH (ref 79–97)
Monocytes Absolute: 0.2 10*3/uL (ref 0.1–0.9)
Monocytes: 3 %
Neutrophils Absolute: 4.9 10*3/uL (ref 1.4–7.0)
Neutrophils: 61 %
Platelets: 319 10*3/uL (ref 150–450)
RBC: 3.93 x10E6/uL (ref 3.77–5.28)
RDW: 11.4 % — ABNORMAL LOW (ref 11.7–15.4)
WBC: 8 10*3/uL (ref 3.4–10.8)

## 2019-07-18 LAB — COMPREHENSIVE METABOLIC PANEL
ALT: 22 IU/L (ref 0–32)
AST: 31 IU/L (ref 0–40)
Albumin/Globulin Ratio: 1.5 (ref 1.2–2.2)
Albumin: 4.4 g/dL (ref 3.8–4.8)
Alkaline Phosphatase: 52 IU/L (ref 39–117)
BUN/Creatinine Ratio: 6 — ABNORMAL LOW (ref 9–23)
BUN: 5 mg/dL — ABNORMAL LOW (ref 6–24)
Bilirubin Total: 0.5 mg/dL (ref 0.0–1.2)
CO2: 20 mmol/L (ref 20–29)
Calcium: 9.3 mg/dL (ref 8.7–10.2)
Chloride: 104 mmol/L (ref 96–106)
Creatinine, Ser: 0.83 mg/dL (ref 0.57–1.00)
GFR calc Af Amer: 101 mL/min/{1.73_m2} (ref >59)
GFR calc non Af Amer: 87 mL/min/{1.73_m2} (ref >59)
Globulin, Total: 2.9 g/dL (ref 1.5–4.5)
Glucose: 122 mg/dL — ABNORMAL HIGH (ref 65–99)
Potassium: 4.5 mmol/L (ref 3.5–5.2)
Sodium: 138 mmol/L (ref 134–144)
Total Protein: 7.3 g/dL (ref 6.0–8.5)

## 2019-07-18 LAB — LIPID PANEL WITH LDL/HDL RATIO
Cholesterol, Total: 193 mg/dL (ref 100–199)
HDL: 39 mg/dL — ABNORMAL LOW (ref >39)
LDL Chol Calc (NIH): 129 mg/dL — ABNORMAL HIGH (ref 0–99)
LDL/HDL Ratio: 3.3 ratio — ABNORMAL HIGH (ref 0.0–3.2)
Triglycerides: 136 mg/dL (ref 0–149)
VLDL Cholesterol Cal: 25 mg/dL (ref 5–40)

## 2019-07-18 LAB — T3: T3, Total: 170 ng/dL (ref 71–180)

## 2019-07-18 LAB — INSULIN, RANDOM: INSULIN: 19.6 u[IU]/mL (ref 2.6–24.9)

## 2019-07-18 LAB — T4, FREE: Free T4: 1.02 ng/dL (ref 0.82–1.77)

## 2019-07-18 LAB — HEMOGLOBIN A1C
Est. average glucose Bld gHb Est-mCnc: 111 mg/dL
Hgb A1c MFr Bld: 5.5 % (ref 4.8–5.6)

## 2019-07-18 LAB — VITAMIN B12: Vitamin B-12: 585 pg/mL (ref 232–1245)

## 2019-07-18 LAB — TSH: TSH: 2.21 u[IU]/mL (ref 0.450–4.500)

## 2019-07-18 LAB — VITAMIN D 25 HYDROXY (VIT D DEFICIENCY, FRACTURES): Vit D, 25-Hydroxy: 20.1 ng/mL — ABNORMAL LOW (ref 30.0–100.0)

## 2019-07-18 LAB — FOLATE: Folate: 9.1 ng/mL (ref >3.0)

## 2019-07-21 ENCOUNTER — Other Ambulatory Visit: Payer: Self-pay | Admitting: Family Medicine

## 2019-07-21 NOTE — Progress Notes (Signed)
Chief Complaint:   OBESITY Kathryn Munoz (MR# [id number]) is a 44 y.o. female who presents for evaluation and treatment of obesity and related comorbidities. Current BMI is Body mass index is 41.93 kg/m. Kathryn Munoz has been struggling with her weight for many years and has been unsuccessful in either losing weight, maintaining weight loss, or reaching her healthy weight goal.  Kathryn Munoz is currently in the action stage of change and ready to dedicate time achieving and maintaining a healthier weight. Kathryn Munoz is interested in becoming our patient and working on intensive lifestyle modifications including (but not limited to) diet and exercise for weight loss.  Kathryn Munoz's habits were reviewed today and are as follows: she thinks her family will eat healthier with her, her desired weight loss is 67 lbs, she has been heavy most of her life, she started gaining weight in 3rd grade, her heaviest weight ever was 256 pounds, she has significant food cravings issues, she is frequently drinking liquids with calories, she frequently makes poor food choices, she frequently eats larger portions than normal and she struggles with emotional eating.  Depression Screen Kathryn Munoz's Food and Mood (modified PHQ-9) score was 19.  Depression screen [facility] 2/9 07/17/2019  Decreased Interest 3  Down, Depressed, Hopeless 3  PHQ - 2 Score 6  Altered sleeping 3  Tired, decreased energy 3  Change in appetite 2  Feeling bad or failure about yourself  1  Trouble concentrating 1  Moving slowly or fidgety/restless 2  Suicidal thoughts 1  PHQ-9 Score 19  Difficult doing work/chores Very difficult  Some recent data might be hidden   Subjective:   1. Other fatigue Kathryn Munoz admits to daytime somnolence and admits to waking up still tired. Patent has a history of symptoms of daytime fatigue. Kathryn Munoz generally gets 7 hours of sleep per night, and states that she has nightime awakenings. Snoring is present. Apneic episodes  are not present. Epworth Sleepiness Score is 14.  2. Shortness of breath on exertion Kathryn Munoz notes increasing shortness of breath with exercising and seems to be worsening over time with weight gain. She notes getting out of breath sooner with activity than she used to. This has not gotten worse recently. Kathryn Munoz denies shortness of breath at rest or orthopnea.  3. Hyperglycemia Kathryn Munoz is on metformin, and her last A1c was elevated at 5.7. This may be related in part to some of her medications, but she has a family history of diabetes mellitus as well.  4. Vitamin D deficiency Kathryn Munoz's last Vit D level was low and she notes fatigue.  5. Bipolar 2 disorder, major depressive episode (HCC) Kathryn Munoz has bipolar 2 with depression, anxiety, and ADHD. She has her own Psychiatrist, but her therapist retired recently and she is looking for another. She states she is stable on her medications.  6. Other specified hypothyroidism Kathryn Munoz is on levothyroxine, and last TSH was elevated at 8.4. She notes fatigue.  Assessment/Plan:   1. Other fatigue Kathryn Munoz does feel that her weight is causing her energy to be lower than it should be. Fatigue may be related to obesity, depression or many other causes. Labs will be ordered, and in the meanwhile, Kathryn Munoz will focus on self care including making healthy food choices, increasing physical activity and focusing on stress reduction.  - EKG 12-Lead - Vitamin B12 - CBC with Differential/Platelet - Folate  2. Shortness of breath on exertion Kathryn Munoz does feel that she gets out of breath more easily that she used  to when she exercises. Kathryn Munoz shortness of breath appears to be obesity related and exercise induced. She has agreed to work on weight loss and gradually increase exercise to treat her exercise induced shortness of breath. Will continue to monitor closely.  - Lipid Panel With LDL/HDL Ratio  3. Hyperglycemia Fasting labs will be obtained today and  results with be discussed with Kathryn Munoz in 2 weeks at her follow up visit. In the meanwhile Kathryn Munoz was started on a lower simple carbohydrate diet and will work on weight loss efforts.  - Comprehensive metabolic panel - Hemoglobin A1c - Insulin, random  4. Vitamin D deficiency Low Vitamin D level contributes to fatigue and are associated with obesity, breast, and colon cancer. Kathryn Munoz will follow-up for routine testing of Vitamin D, at least 2-3 times per year to avoid over-replacement. We will check labs today.  - VITAMIN D 25 Hydroxy (Vit-D Deficiency, Fractures)  5. Bipolar 2 disorder, major depressive episode Kathryn Munoz) We will refer Kathryn Munoz to Kathryn Munoz, our Bariatric Psychologist for evaluation.  6. Other specified hypothyroidism Patient with long-standing hypothyroidism, on levothyroxine therapy. She appears euthyroid. We will check labs today. Orders and follow up as documented in patient record.  - T3 - T4, free - TSH  7. Depression screening Kathryn Munoz had a positive depression screening. Depression is commonly associated with obesity and often results in emotional eating behaviors. We will monitor this closely and work on CBT to help improve the non-hunger eating patterns. Referral to Psychology may be required if no improvement is seen as she continues in our clinic.  8. Class 3 severe obesity with serious comorbidity and body mass index (BMI) of 40.0 to 44.9 in adult, unspecified obesity type Kathryn Munoz) Kathryn Munoz is currently in the action stage of change and her goal is to continue with weight loss efforts. I recommend Kathryn Munoz begin the structured treatment plan as follows:  She has agreed to the Category 3 Plan.  Exercise goals: No exercise has been prescribed for now, while we concentrate on nutritional changes.   Behavioral modification strategies: increasing lean protein intake and no skipping meals.  She was informed of the importance of frequent follow-up visits to maximize her  success with intensive lifestyle modifications for her multiple health conditions. She was informed we would discuss her lab results at her next visit unless there is a critical issue that needs to be addressed sooner. Kathryn Munoz agreed to keep her next visit at the agreed upon time to discuss these results.  Objective:   Blood pressure 136/85, pulse 79, temperature 98.8 F (37.1 C), temperature source Oral, weight 252 lb (114.3 kg), last menstrual period 06/28/2019, SpO2 97 %. Body mass index is 41.93 kg/m.  EKG: Normal sinus rhythm, rate 78 BPM.  Indirect Calorimeter completed today shows a VO2 of 322 and a REE of 2238.  Her calculated basal metabolic rate is 7782 thus her basal metabolic rate is better than expected.  General: Cooperative, alert, well developed, in no acute distress. HEENT: Conjunctivae and lids unremarkable. Cardiovascular: Regular rhythm.  Lungs: Normal work of breathing. Neurologic: No focal deficits.   Lab Results  Component Value Date   CREATININE 0.83 07/17/2019   BUN 5 (L) 07/17/2019   NA 138 07/17/2019   K 4.5 07/17/2019   CL 104 07/17/2019   CO2 20 07/17/2019   Lab Results  Component Value Date   ALT 22 07/17/2019   AST 31 07/17/2019   ALKPHOS 52 07/17/2019   BILITOT 0.5 07/17/2019   Lab  Results  Component Value Date   HGBA1C 5.5 07/17/2019   HGBA1C 5.7 (H) 03/31/2019   HGBA1C 4.8 05/09/2018   HGBA1C 5.5 10/22/2017   HGBA1C 4.7 10/11/2015   Lab Results  Component Value Date   INSULIN 19.6 07/17/2019   Lab Results  Component Value Date   TSH 2.210 07/17/2019   Lab Results  Component Value Date   CHOL 193 07/17/2019   HDL 39 (L) 07/17/2019   LDLCALC 129 (H) 07/17/2019   TRIG 136 07/17/2019   CHOLHDL 3.7 03/31/2019   Lab Results  Component Value Date   WBC 8.0 07/17/2019   HGB 12.6 07/17/2019   HCT 38.6 07/17/2019   MCV 98 (H) 07/17/2019   PLT 319 07/17/2019   No results found for: IRON, TIBC, FERRITIN  Attestation Statements:    Reviewed by clinician on day of visit: allergies, medications, problem list, medical history, surgical history, family history, social history, and previous encounter notes.  Time spent on visit including pre-visit chart review and post-visit charting and care was 60 minutes.    I, Burt Knack, am acting as transcriptionist for Quillian Quince, MD.  I have reviewed the above documentation for accuracy and completeness, and I agree with the above. - Quillian Quince, MD

## 2019-07-28 ENCOUNTER — Telehealth (INDEPENDENT_AMBULATORY_CARE_PROVIDER_SITE_OTHER): Payer: Self-pay | Admitting: Psychology

## 2019-07-28 ENCOUNTER — Telehealth (INDEPENDENT_AMBULATORY_CARE_PROVIDER_SITE_OTHER): Payer: BC Managed Care – PPO | Admitting: Psychology

## 2019-07-28 DIAGNOSIS — F5089 Other specified eating disorder: Secondary | ICD-10-CM

## 2019-07-28 DIAGNOSIS — F319 Bipolar disorder, unspecified: Secondary | ICD-10-CM

## 2019-07-28 NOTE — Telephone Encounter (Signed)
Error

## 2019-07-29 NOTE — Progress Notes (Signed)
Office: [phone]  /  Fax: [phone]    Date: Aug 12, 2019   Appointment Start Time: 10:38am Duration: 24 minutes Provider: Glennie Isle, Psy.D. Type of Session: Individual Therapy  Location of Patient: Work Location of Provider: Provider's Home Type of Contact: Telepsychological Visit via Telephone call  Session Content: This provider called Kathryn Munoz at 10:00am and 10:02am as MyChart Video Visit indicated Kathryn Munoz joined the visit; however, there was no audio or video capability on [name]'s end. A HIPAA compliant voicemail was left at 10:32am requesting she call back to address the connection issues. This provider called her again at 10:34am and was able to provide assistance. This provider called again at 10:37am, as Kathryn Munoz was unable to connect with video and audio capabilities. She provided verbal consent to proceed via a regular telephone call. As such, today's appointment was initiated 8 minutes late. Kathryn Munoz is a 44 y.o. female presenting via telephone for a follow-up appointment to address the previously established treatment goal of increasing coping skills. Today's appointment was a telepsychological visit due to COVID-19. Kathryn Munoz provided verbal consent for today's telepsychological appointment and she is aware she is responsible for securing confidentiality on her end of the session. Prior to proceeding with today's appointment, Kathryn Munoz's physical location at the time of this appointment was obtained as well a phone number she could be reached at in the event of technical difficulties. Kathryn Munoz and this provider participated in today's telepsychological service.   This provider conducted a brief check-in. Kathryn Munoz stated she met with a mental health provider at [facility], noting "It went good." She stated they have an appointment next Tuesday. She recalled having a "temper tantrum" for approximately 20 minutes last Saturday. This was further explored and psychoeducation  regarding the consequences of not eating regularly was provided. Session also focused on problem solving to help her cope should the aforementioned happen again in the future. She was receptive to having snacks congruent to the meal plan available to her. Moreover, psychoeducation regarding a grounding technique was provided and she was engaged in an exercise. Kathryn Munoz provided verbal consent during today's appointment for this provider to send a handout for the grounding exercise via e-mail. Overall, Kathryn Munoz was receptive to today's appointment as evidenced by openness to sharing, responsiveness to feedback, and willingness to implement discussed strategies .  Mental Status Examination:  Appearance: unable to assess  Behavior: unable to assess Mood: euthymic Affect: unable to fully assess Speech: normal in rate, volume, and tone Eye Contact: unable to assess Psychomotor Activity: unable to assess  Gait: unable to assess Thought Process: linear, logical, and goal directed  Thought Content/Perception: denies suicidal and homicidal ideation, plan, and intent and no hallucinations, delusions, bizarre thinking or behavior reported or observed Orientation: time, person, place and purpose of appointment Memory/Concentration: memory, attention, language, and fund of knowledge intact  Insight/Judgment: good  Interventions:  Conducted a brief chart review Provided empathic reflections and validation Employed supportive psychotherapy interventions to facilitate reduced distress and to improve coping skills with identified stressors Employed motivational interviewing skills to assess patient's willingness/desire to adhere to recommended medical treatments and assignments Engaged patient in problem solving Psychoeducation regarding grounding techniques was provided Engaged patient in a grounding technique  DSM-5 Diagnosis(es): 307.59 (F50.8) Other Specified Feeding or Eating Disorder, Emotional Eating  Behaviors and 296.80 (F31.9) Unspecified Bipolar and Related Disorder  Treatment Goal & Progress: During the initial appointment with this provider, the following treatment goal was established: increase coping skills. Progress is limited, as [name]  has just begun treatment with this provider; however, she is receptive to the interaction and interventions and rapport is being established.   Plan: The next appointment will be scheduled in approximately three weeks, which will be via MyChart Video Visit. The next session will focus on working towards the established treatment goal.

## 2019-07-31 ENCOUNTER — Ambulatory Visit (INDEPENDENT_AMBULATORY_CARE_PROVIDER_SITE_OTHER): Payer: BC Managed Care – PPO | Admitting: Family Medicine

## 2019-07-31 ENCOUNTER — Encounter (INDEPENDENT_AMBULATORY_CARE_PROVIDER_SITE_OTHER): Payer: Self-pay | Admitting: Family Medicine

## 2019-07-31 VITALS — BP 125/75 | HR 88 | Temp 98.0°F | Ht 65.0 in | Wt 245.0 lb

## 2019-07-31 DIAGNOSIS — E559 Vitamin D deficiency, unspecified: Secondary | ICD-10-CM | POA: Diagnosis not present

## 2019-07-31 DIAGNOSIS — R7303 Prediabetes: Secondary | ICD-10-CM

## 2019-07-31 DIAGNOSIS — E038 Other specified hypothyroidism: Secondary | ICD-10-CM

## 2019-07-31 DIAGNOSIS — E7849 Other hyperlipidemia: Secondary | ICD-10-CM | POA: Diagnosis not present

## 2019-07-31 DIAGNOSIS — Z9189 Other specified personal risk factors, not elsewhere classified: Secondary | ICD-10-CM

## 2019-07-31 DIAGNOSIS — Z6841 Body Mass Index (BMI) 40.0 and over, adult: Secondary | ICD-10-CM

## 2019-08-01 ENCOUNTER — Other Ambulatory Visit: Payer: Self-pay | Admitting: Obstetrics and Gynecology

## 2019-08-01 DIAGNOSIS — N921 Excessive and frequent menstruation with irregular cycle: Secondary | ICD-10-CM

## 2019-08-04 MED ORDER — VITAMIN D (ERGOCALCIFEROL) 1.25 MG (50000 UNIT) PO CAPS: 50000.0000 [IU] | ORAL_CAPSULE | ORAL | 0 refills | Status: DC

## 2019-08-04 MED ORDER — LEVOTHYROXINE SODIUM 75 MCG PO TABS: 75.0000 ug | ORAL_TABLET | Freq: Every day | ORAL | 0 refills | Status: DC

## 2019-08-04 NOTE — Progress Notes (Signed)
Chief Complaint:   OBESITY Kathryn Munoz is here to discuss her progress with her obesity treatment plan along with follow-up of her obesity related diagnoses. Kathryn Munoz is on the Category 3 Plan and states she is following her eating plan approximately 95% of the time. Kathryn Munoz states she is painted a room and went on a walk.  Today's visit was #: 2 Starting weight: 252 lbs Starting date: 07/17/2019 Today's weight: 245 lbs Today's date: 07/31/2019 Total lbs lost to date: 7 Total lbs lost since last in-office visit: 7  Interim History: Kathryn Munoz enjoyed breakfast and felt that lunch and dinner were okay. She felt that her hunger was well controlled and she was able to consistently consume  All of the food on the meal plan.  Subjective:   1. Other specified hypothyroidism Kathryn Munoz's TSH was elevated at 8.40 on 03/31/2019. Her thyroid panel on 07/17/2019 is now within normal limits. She is currently on levothyroxine 75 mcg, increased from 50 mcg in January. I discussed labs with the patient today.  2. Vitamin D deficiency Kathryn Munoz's Vit D level on 07/17/2019 was 20.1. She is not on Vit D supplementation. I discussed labs with the patient today.  3. Other hyperlipidemia Kathryn Munoz's lipid panel on 07/17/2019, showed a total cholesterol of 193, triglycerides 136, HDL 39, and LDL 129. She is not on statin therapy. I discussed labs with the patient today.  4. Pre-diabetes Kathryn Munoz's A1c on 4/29/21021 was 5.5, and insulin level was 19.6. Her last A1c on 03/31/2019 was 5.7. She is taking metformin 500 mg 3 tablets q daily, and is tolerating it well. She has a history of gestational diabetes.  5. At risk for osteoporosis Kathryn Munoz is at higher risk of osteopenia and osteoporosis due to Vitamin D deficiency.   Assessment/Plan:   1. Other specified hypothyroidism Patient with long-standing hypothyroidism, on levothyroxine therapy. She appears euthyroid. We will refill levothyroxine for 1 month. Orders and  follow up as documented in patient record.  - levothyroxine (SYNTHROID) 75 MCG tablet; Take 1 tablet (75 mcg total) by mouth daily before breakfast.  Dispense: 30 tablet; Refill: 0  2. Vitamin D deficiency Low Vitamin D level contributes to fatigue and are associated with obesity, breast, and colon cancer. Dearra agreed to start prescription Vitamin D 50,000 IU every week with no refills. She will follow-up for routine testing of Vitamin D, at least 2-3 times per year to avoid over-replacement. We will recheck labs in 3 months.  - Vitamin D, Ergocalciferol, (DRISDOL) 1.25 MG (50000 UNIT) CAPS capsule; Take 1 capsule (50,000 Units total) by mouth every 7 (seven) days.  Dispense: 4 capsule; Refill: 0  3. Other hyperlipidemia Cardiovascular risk and specific lipid/LDL goals reviewed. We discussed several lifestyle modifications today. Navie will continue her Category 3 meal plan, and will continue to work on exercise and weight loss efforts. We will recheck labs in 3 months. Orders and follow up as documented in patient record.   4. Pre-diabetes Juni will continue her current metformin dose, and her Category 3 meal plan. She will continue to work on weight loss, exercise, and decreasing simple carbohydrates to help decrease the risk of diabetes.   5. At risk for osteoporosis Terrilyn was given approximately 30 minutes of osteoporosis prevention counseling today. Kathryn Munoz is at risk for osteopenia and osteoporosis due to her Vitamin D deficiency. She was encouraged to take her Vitamin D and follow her higher calcium diet and increase strengthening exercise to help strengthen her bones and decrease her  risk of osteopenia and osteoporosis.  Repetitive spaced learning was employed today to elicit superior memory formation and behavioral change.  6. Class 3 severe obesity with serious comorbidity and body mass index (BMI) of 40.0 to 44.9 in adult, unspecified obesity type [facility]) Kathryn Munoz is currently  in the action stage of change. As such, her goal is to continue with weight loss efforts. She has agreed to the Category 3 Plan.   Exercise goals: As is.  Behavioral modification strategies: increasing lean protein intake, decreasing simple carbohydrates and meal planning and cooking strategies.  Kathryn Munoz has agreed to follow-up with our clinic in 2 weeks with Dr. Manson Passey. She was informed of the importance of frequent follow-up visits to maximize her success with intensive lifestyle modifications for her multiple health conditions.   Objective:   Blood pressure 125/75, pulse 88, temperature 98 F (36.7 C), temperature source Oral, height 5\' 5"  (1.651 m), weight 245 lb (111.1 kg), SpO2 98 %. Body mass index is 40.77 kg/m.  General: Cooperative, alert, well developed, in no acute distress. HEENT: Conjunctivae and lids unremarkable. Cardiovascular: Regular rhythm.  Lungs: Normal work of breathing. Neurologic: No focal deficits.   Lab Results  Component Value Date   CREATININE 0.83 07/17/2019   BUN 5 (L) 07/17/2019   NA 138 07/17/2019   K 4.5 07/17/2019   CL 104 07/17/2019   CO2 20 07/17/2019   Lab Results  Component Value Date   ALT 22 07/17/2019   AST 31 07/17/2019   ALKPHOS 52 07/17/2019   BILITOT 0.5 07/17/2019   Lab Results  Component Value Date   HGBA1C 5.5 07/17/2019   HGBA1C 5.7 (H) 03/31/2019   HGBA1C 4.8 05/09/2018   HGBA1C 5.5 10/22/2017   HGBA1C 4.7 10/11/2015   Lab Results  Component Value Date   INSULIN 19.6 07/17/2019   Lab Results  Component Value Date   TSH 2.210 07/17/2019   Lab Results  Component Value Date   CHOL 193 07/17/2019   HDL 39 (L) 07/17/2019   LDLCALC 129 (H) 07/17/2019   TRIG 136 07/17/2019   CHOLHDL 3.7 03/31/2019   Lab Results  Component Value Date   WBC 8.0 07/17/2019   HGB 12.6 07/17/2019   HCT 38.6 07/17/2019   MCV 98 (H) 07/17/2019   PLT 319 07/17/2019   No results found for: IRON, TIBC, FERRITIN  Attestation  Statements:   Reviewed by clinician on day of visit: allergies, medications, problem list, medical history, surgical history, family history, social history, and previous encounter notes.   I, 07/19/2019, am acting as transcriptionist for Burt Knack, MD.  I have reviewed the above documentation for accuracy and completeness, and I agree with the above. -  Quillian Quince, MD

## 2019-08-12 ENCOUNTER — Telehealth (INDEPENDENT_AMBULATORY_CARE_PROVIDER_SITE_OTHER): Payer: BC Managed Care – PPO | Admitting: Psychology

## 2019-08-12 ENCOUNTER — Other Ambulatory Visit: Payer: Self-pay

## 2019-08-12 DIAGNOSIS — F319 Bipolar disorder, unspecified: Secondary | ICD-10-CM | POA: Diagnosis not present

## 2019-08-12 DIAGNOSIS — F5089 Other specified eating disorder: Secondary | ICD-10-CM | POA: Diagnosis not present

## 2019-08-14 ENCOUNTER — Other Ambulatory Visit: Payer: Self-pay

## 2019-08-14 ENCOUNTER — Ambulatory Visit (INDEPENDENT_AMBULATORY_CARE_PROVIDER_SITE_OTHER): Payer: BC Managed Care – PPO | Admitting: Bariatrics

## 2019-08-14 ENCOUNTER — Encounter (INDEPENDENT_AMBULATORY_CARE_PROVIDER_SITE_OTHER): Payer: Self-pay | Admitting: Bariatrics

## 2019-08-14 VITALS — BP 117/75 | HR 73 | Temp 98.6°F | Ht 65.0 in | Wt 243.0 lb

## 2019-08-14 DIAGNOSIS — E559 Vitamin D deficiency, unspecified: Secondary | ICD-10-CM

## 2019-08-14 DIAGNOSIS — Z6841 Body Mass Index (BMI) 40.0 and over, adult: Secondary | ICD-10-CM

## 2019-08-14 DIAGNOSIS — E8881 Metabolic syndrome: Secondary | ICD-10-CM | POA: Diagnosis not present

## 2019-08-14 NOTE — Progress Notes (Signed)
Chief Complaint:   OBESITY Kathryn Munoz is here to discuss her progress with her obesity treatment plan along with follow-up of her obesity related diagnoses. Kathryn Munoz is on the Category 3 Plan and states she is following her eating plan approximately 90% of the time. Kathryn Munoz states she has increased her activity.   Today's visit was #: 3 Starting weight: 252 lbs Starting date: 07/17/2019 Today's weight: 243 lbs Today's date: 08/14/2019 Total lbs lost to date: 9 Total lbs lost since last in-office visit: 2  Interim History: Kathryn Munoz is down 2 lbs and doing well overall. She has had some social situations that did disrupt her eating.  Subjective:   Insulin resistance. Kathryn Munoz has a diagnosis of insulin resistance based on her elevated fasting insulin level >5. She continues to work on diet and exercise to decrease her risk of diabetes. Kathryn Munoz is taking Glucophage.  Lab Results  Component Value Date   INSULIN 19.6 07/17/2019   Lab Results  Component Value Date   HGBA1C 5.5 07/17/2019   Vitamin D deficiency. Kathryn Munoz is taking Vitamin D supplementation. Last Vitamin D 20.1 on 07/17/2019.  Assessment/Plan:   Insulin resistance. Kathryn Munoz will continue to work on weight loss, exercise, and decreasing simple carbohydrates to help decrease the risk of diabetes. Kathryn Munoz agreed to follow-up with [place] as directed to closely monitor her progress. She will continue her medication as directed.   Vitamin D deficiency. Low Vitamin D level contributes to fatigue and are associated with obesity, breast, and colon cancer. She agrees to continue to take Vitamin D and will follow-up for routine testing of Vitamin D, at least 2-3 times per year to avoid over-replacement.  Class 3 severe obesity with serious comorbidity and body mass index (BMI) of 40.0 to 44.9 in adult, unspecified obesity type (HCC).  Kathryn Munoz is currently in the action stage of change. As such, her goal is to continue with  weight loss efforts. She has agreed to the Category 3 Plan.   She will work on meal planning, intentional eating, and continue to work on snacking. Handout was given for "[employer]."  Exercise goals: Kathryn Munoz has increased her activity.  Behavioral modification strategies: increasing lean protein intake, decreasing simple carbohydrates, increasing vegetables, increasing water intake, decreasing eating out, no skipping meals, meal planning and cooking strategies, keeping healthy foods in the home, ways to avoid boredom eating and planning for success.  Kathryn Munoz has agreed to follow-up with our clinic in 2-3 weeks. She was informed of the importance of frequent follow-up visits to maximize her success with intensive lifestyle modifications for her multiple health conditions.   Objective:   Blood pressure 117/75, pulse 73, temperature 98.6 F (37 C), temperature source Oral, height 5\' 5"  (1.651 m), weight 243 lb (110.2 kg), SpO2 99 %. Body mass index is 40.44 kg/m.  General: Cooperative, alert, well developed, in no acute distress. HEENT: Conjunctivae and lids unremarkable. Cardiovascular: Regular rhythm.  Lungs: Normal work of breathing. Neurologic: No focal deficits.   Lab Results  Component Value Date   CREATININE 0.83 07/17/2019   BUN 5 (L) 07/17/2019   NA 138 07/17/2019   K 4.5 07/17/2019   CL 104 07/17/2019   CO2 20 07/17/2019   Lab Results  Component Value Date   ALT 22 07/17/2019   AST 31 07/17/2019   ALKPHOS 52 07/17/2019   BILITOT 0.5 07/17/2019   Lab Results  Component Value Date   HGBA1C 5.5 07/17/2019   HGBA1C 5.7 (H)  03/31/2019   HGBA1C 4.8 05/09/2018   HGBA1C 5.5 10/22/2017   HGBA1C 4.7 10/11/2015   Lab Results  Component Value Date   INSULIN 19.6 07/17/2019   Lab Results  Component Value Date   TSH 2.210 07/17/2019   Lab Results  Component Value Date   CHOL 193 07/17/2019   HDL 39 (L) 07/17/2019   LDLCALC 129 (H) 07/17/2019   TRIG 136  07/17/2019   CHOLHDL 3.7 03/31/2019   Lab Results  Component Value Date   WBC 8.0 07/17/2019   HGB 12.6 07/17/2019   HCT 38.6 07/17/2019   MCV 98 (H) 07/17/2019   PLT 319 07/17/2019   No results found for: IRON, TIBC, FERRITIN  Attestation Statements:   Reviewed by clinician on day of visit: allergies, medications, problem list, medical history, surgical history, family history, social history, and previous encounter notes.  Time spent on visit including pre-visit chart review and post-visit charting and care was 20 minutes.   Migdalia Dk, am acting as Location manager for [employer], DO   I have reviewed the above documentation for accuracy and completeness, and I agree with the above. Jearld Lesch, DO

## 2019-08-19 ENCOUNTER — Encounter (INDEPENDENT_AMBULATORY_CARE_PROVIDER_SITE_OTHER): Payer: Self-pay | Admitting: Bariatrics

## 2019-08-19 NOTE — Progress Notes (Signed)
Office: [phone]  /  Fax: [phone]    Date: September 01, 2019   Appointment Start Time: 10:37am Duration: 23 minutes Munoz: Lawerance Cruel, Psy.D. Type of Session: Individual Therapy  Location of Patient: Work Location of Munoz: Provider's Home Type of Contact: Telepsychological Visit via Telephone Call  Session Content:Kathryn Munoz called Kathryn Munoz at 10:32 as MyChart Video Visit showed she joined, but there was no audio/video capabilities on her end. She indicated she called IT and would try to re-join. Kathryn Munoz called her again due to the same issue. She provided verbal consent to provided via a regular telephone call. As such, today's appointment was initiated 7 minutes late. Kathryn Munoz is a 44 y.o. female presenting via telephone call for a follow-up appointment to address the previously established treatment goal of increasing coping skills. Today's appointment was a telepsychological visit due to COVID-19. Kathryn Munoz provided verbal consent for today's telepsychological appointment and she is aware she is responsible for securing confidentiality on her end of the session. Prior to proceeding with today's appointment, Kathryn Munoz physical location at the time of Kathryn appointment was obtained as well a phone number she could be reached at in the event of technical difficulties. Kathryn Munoz and Kathryn Munoz participated in today's telepsychological service.   Kathryn Munoz conducted a brief check-in. Kathryn Munoz shared about her recent appointment with her psychiatrist, noting changes in her medications. She noted she also continues to meet with her other therapist. Regarding eating, Kathryn Munoz reported she was triggered to eat yesterday as she was "upset." Psychoeducation regarding triggers for emotional eating was provided. Kathryn Munoz was provided a handout, and encouraged to utilize the handout between now and the next appointment to increase awareness of triggers and frequency. Kathryn Munoz agreed. Kathryn  Munoz also discussed behavioral strategies for specific triggers, such as placing the utensil down when conversing to avoid mindless eating. She identified "anger" as her most frequent trigger. Psychoeducation regarding anger as a secondary emotion was also provided. Nakina provided verbal consent during today's appointment for Kathryn Munoz to send a handout about triggers via e-mail. Shayona was receptive to today's appointment as evidenced by openness to sharing, responsiveness to feedback, and willingness to explore triggers for emotional eating.  Mental Status Examination:  Appearance: unable to assess  Behavior: unable to assess Mood: euthymic Affect: unable to fully assess Speech: normal in rate, volume, and tone Eye Contact: unable to assess Psychomotor Activity: unable to assess  Gait: unable to assess Thought Process: linear, logical, and goal directed  Thought Content/Perception: denies suicidal and homicidal ideation, plan, and intent and no hallucinations, delusions, bizarre thinking or behavior reported or observed Orientation: time, person, place, and purpose of appointment Memory/Concentration: memory, attention, language, and fund of knowledge intact  Insight/Judgment: good  Interventions:  Conducted a brief chart review Conducted a risk assessment Provided empathic reflections and validation Employed supportive psychotherapy interventions to facilitate reduced distress and to improve coping skills with identified stressors Employed motivational interviewing skills to assess patient's willingness/desire to adhere to recommended medical treatments and assignments Psychoeducation provided regarding triggers for emotional eating  DSM-5 Diagnosis(es): 307.59 (F50.8) Other Specified Feeding or Eating Disorder, Emotional Eating Behaviors and 296.80 (F31.9) Unspecified Bipolar and Related Disorder  Treatment Goal & Progress: During the initial appointment with Kathryn Munoz,  the following treatment goal was established: increase coping skills. Genessis has demonstrated progress in her goal as evidenced by increased awareness of hunger patterns.   Plan: The next appointment will be scheduled in two weeks, which will be via MyChart  Video Visit. The next session will focus on working towards the established treatment goal.

## 2019-08-27 ENCOUNTER — Other Ambulatory Visit: Payer: Self-pay

## 2019-08-27 ENCOUNTER — Encounter: Payer: Self-pay | Admitting: Psychiatry

## 2019-08-27 ENCOUNTER — Telehealth (INDEPENDENT_AMBULATORY_CARE_PROVIDER_SITE_OTHER): Payer: BC Managed Care – PPO | Admitting: Psychiatry

## 2019-08-27 DIAGNOSIS — F3181 Bipolar II disorder: Secondary | ICD-10-CM

## 2019-08-27 DIAGNOSIS — F5105 Insomnia due to other mental disorder: Secondary | ICD-10-CM | POA: Diagnosis not present

## 2019-08-27 DIAGNOSIS — F429 Obsessive-compulsive disorder, unspecified: Secondary | ICD-10-CM

## 2019-08-27 MED ORDER — LAMOTRIGINE 25 MG PO TABS: 25.0000 mg | ORAL_TABLET | Freq: Every day | ORAL | 0 refills | Status: DC

## 2019-08-27 MED ORDER — CITALOPRAM HYDROBROMIDE 40 MG PO TABS: 40.0000 mg | ORAL_TABLET | Freq: Every day | ORAL | 1 refills | Status: DC

## 2019-08-27 MED ORDER — LAMOTRIGINE 150 MG PO TABS: 150.0000 mg | ORAL_TABLET | Freq: Every day | ORAL | 1 refills | Status: DC

## 2019-08-27 MED ORDER — QUETIAPINE FUMARATE 50 MG PO TABS: 50.0000 mg | ORAL_TABLET | Freq: Every day | ORAL | 0 refills | Status: DC

## 2019-08-27 NOTE — Progress Notes (Signed)
Provider Location : [facility] Patient Location : Work  Virtual Visit via Video Note  I connected with Kathryn Munoz on 08/27/19 at 11:45 AM EDT by a video enabled telemedicine application and verified that I am speaking with the correct person using two identifiers.   I discussed the limitations of evaluation and management by telemedicine and the availability of in person appointments. The patient expressed understanding and agreed to proceed.   I discussed the assessment and treatment plan with the patient. The patient was provided an opportunity to ask questions and all were answered. The patient agreed with the plan and demonstrated an understanding of the instructions.   The patient was advised to call back or seek an in-person evaluation if the symptoms worsen or if the condition fails to improve as anticipated.   BH MD OP Progress Note  08/27/2019 5:03 PM Kathryn Munoz  MRN:  [id number]  Chief Complaint:  Chief Complaint    Follow-up     HPI: Kathryn Munoz is a 44 year old African-American female, lives in [place], has a history of bipolar disorder, insomnia, OCD, insulin resistance, hypothyroidism was evaluated by telemedicine today.  A video call was initiated however due to connection problem it had to be changed to a phone call.  Patient today reports she is currently struggling with depression, mood swings, irritability since the past several weeks.  She reports her mother passed away due to medical causes on March 15.  She reports she had some anger outbursts soon after that however did not have any significant mood swings until few weeks ago.  Patient reports sleep is okay.  She is currently using trazodone as needed along with the Seroquel.  She is tolerating this combination well.  She denies any suicidality, homicidality or perceptual disturbances.  She reports she [facility]  She also has started counseling  with wake counseling and meditation and sees her counselor every 2 weeks or so.  She reports the counseling sessions is going well.  Patient denies any other concerns today.  Visit Diagnosis:    ICD-10-CM   1. Bipolar 2 disorder, major depressive episode (HCC)  F31.81 lamoTRIgine (LAMICTAL) 150 MG tablet    lamoTRIgine (LAMICTAL) 25 MG tablet    QUEtiapine (SEROQUEL) 50 MG tablet    citalopram (CELEXA) 40 MG tablet  2. Insomnia due to mental condition  F51.05   3. 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  Past trials of Celexa.  Patient continues to follow-up with counselor-however is currently with wake counseling and meditation.  Past Medical History:  Past Medical History:  Diagnosis Date   Abnormal mammogram    breast biopsy, [facility] 2014   ADD (attention deficit disorder)    Anxiety    Bipolar 2 disorder (HCC)    Constipation    Depression    Dermatitis    Heavy menstrual bleeding    History of gestational diabetes    History of kidney stones 2014   Hypothyroidism    IFG (impaired fasting glucose)    Insulin resistance    Irregular periods    Joint pain    Multiple food allergies    Dates and nuts   Neuropathy    Obesity    OCD (obsessive compulsive disorder)    Prediabetes  Vitamin D deficiency disease     Past Surgical History:  Procedure Laterality Date   BREAST BIOPSY Left 2014   [place] bx/clip-neg   INTRAUTERINE DEVICE INSERTION  03/11/2012    Family Psychiatric History: I have reviewed family psychiatric history from my progress note on 11/08/2017  Family History:  Family History  Problem Relation Age of Onset   Diabetes Mother    Pancreatitis Mother    Hypertension Mother    Alcohol abuse Mother    Schizophrenia Mother    Depression Mother    Heart disease Mother     Stroke Mother    Thyroid disease Mother    Kidney disease Mother    Anxiety disorder Mother    Alcohol abuse Father    Diabetes Sister        pre-diabetic   Diabetes Brother    Drug abuse Brother    ADD / ADHD Son    Eczema Son    Breast cancer Cousin        pat cousin   Anxiety disorder Other    Ovarian cancer Neg Hx     Social History: Reviewed social history from my progress note on 11/08/2017 Social History   Socioeconomic History   Marital status: Married    Spouse name: Kathyjo Briere   Number of children: 2   Years of education: Not on file   Highest education level: Bachelor's degree (e.g., BA, AB, BS)  Occupational History   Occupation: English as a second language teacher  Tobacco Use   Smoking status: Never Smoker   Smokeless tobacco: Never Used  Substance and Sexual Activity   Alcohol use: No   Drug use: No   Sexual activity: Yes    Partners: Male    Birth control/protection: Other-see comments    Comment: nuvaring  Other Topics Concern   Not on file  Social History Narrative   Not on file   Social Determinants of Health   Financial Resource Strain:    Difficulty of Paying Living Expenses:   Food Insecurity:    Worried About Charity fundraiser in the Last Year:    Arboriculturist in the Last Year:   Transportation Needs:    Film/video editor (Medical):    Lack of Transportation (Non-Medical):   Physical Activity: Inactive   Days of Exercise per Week: 0 days   Minutes of Exercise per Session: 0 min  Stress:    Feeling of Stress :   Social Connections:    Frequency of Communication with Friends and Family:    Frequency of Social Gatherings with Friends and Family:    Attends Religious Services:    Active Member of Clubs or Organizations:    Attends Archivist Meetings:    Marital Status:     Allergies:  Allergies  Allergen Reactions   Date Seed Extract  [Zizyphus Jujuba] Anaphylaxis, Cough, Itching and  Swelling    Metabolic Disorder Labs: Lab Results  Component Value Date   HGBA1C 5.5 07/17/2019   MPG 117 03/31/2019   MPG 91 05/09/2018   No results found for: PROLACTIN Lab Results  Component Value Date   CHOL 193 07/17/2019   TRIG 136 07/17/2019   HDL 39 (L) 07/17/2019   CHOLHDL 3.7 03/31/2019   VLDL 14 10/16/2016   LDLCALC 129 (H) 07/17/2019   LDLCALC 110 (H) 03/31/2019   Lab Results  Component Value Date   TSH 2.210 07/17/2019   TSH 8.40 (H) 03/31/2019    Therapeutic  Level Labs: No results found for: LITHIUM No results found for: VALPROATE No components found for:  CBMZ  Current Medications: Current Outpatient Medications  Medication Sig Dispense Refill   citalopram (CELEXA) 40 MG tablet Take 1 tablet (40 mg total) by mouth daily. 90 tablet 1   EPINEPHrine 0.3 mg/0.3 mL IJ SOAJ injection INJECT 0.3 MLS INTO THE MUSCLE ONCE FOR 1 DOSE. FOR LIFE-THREATENING ALLERGIC REACTION / ANAPHYLAXIS  1   etonogestrel-ethinyl estradiol (NUVARING) 0.12-0.015 MG/24HR vaginal ring Place 1 each vaginally every 28 (twenty-eight) days. Insert vaginally and leave in place for 3 consecutive weeks, then remove for 1 week. 3 each 0   lamoTRIgine (LAMICTAL) 150 MG tablet Take 1 tablet (150 mg total) by mouth daily. 90 tablet 1   [START ON 09/10/2019] lamoTRIgine (LAMICTAL) 25 MG tablet Take 1 tablet (25 mg total) by mouth daily. To be combined with Lamictal 150 MG to make 175 mg- start 09/10/2019 90 tablet 0   levothyroxine (SYNTHROID) 75 MCG tablet Take 1 tablet (75 mcg total) by mouth daily before breakfast. 30 tablet 0   metFORMIN (GLUCOPHAGE-XR) 500 MG 24 hr tablet TAKE 3 TABLETS (1,500 MG TOTAL) BY MOUTH DAILY. 270 tablet 3   norethindrone (AYGESTIN) 5 MG tablet Take 1 tablet (5 mg total) by mouth daily for 21 days. As directed taper dose :  3X 1 wk - 2X - 1wk  Then daily 60 tablet 0   polyethylene glycol (MIRALAX / GLYCOLAX) packet Take 17 g by mouth as needed.      QUEtiapine  (SEROQUEL) 50 MG tablet Take 1 tablet (50 mg total) by mouth at bedtime. 90 tablet 0   traZODone (DESYREL) 50 MG tablet TAKE 1 AND 1/2 TO 2 TABLETS (75-100 MG TOTAL) BY MOUTH AT BEDTIME AS NEEDED FOR SLEEP. FOR SLEEP 180 tablet 1   Vitamin D, Ergocalciferol, (DRISDOL) 1.25 MG (50000 UNIT) CAPS capsule Take 1 capsule (50,000 Units total) by mouth every 7 (seven) days. 4 capsule 0   No current facility-administered medications for this visit.     Musculoskeletal: Strength & Muscle Tone: UTA Gait & Station: UTA Patient leans: N/A  Psychiatric Specialty Exam: Review of Systems  Psychiatric/Behavioral: Positive for dysphoric mood. Negative for agitation, behavioral problems, confusion, decreased concentration, hallucinations, self-injury, sleep disturbance and suicidal ideas. The patient is not nervous/anxious and is not hyperactive.   All other systems reviewed and are negative.   There were no vitals taken for this visit.There is no height or weight on file to calculate BMI.  General Appearance: UTA  Eye Contact:  UTA  Speech:  Normal Rate  Volume:  Normal  Mood:  Depressed and Irritable, mood swings  Affect:  UTA  Thought Process:  Goal Directed and NA  Orientation:  Full (Time, Place, and Person)  Thought Content: Logical   Suicidal Thoughts:  No  Homicidal Thoughts:  No  Memory:  Immediate;   Fair Recent;   Fair Remote;   Fair  Judgement:  Fair  Insight:  Fair  Psychomotor Activity:  UTA  Concentration:  Concentration: Fair and Attention Span: Fair  Recall:  [name] of Knowledge: Fair  Language: Fair  Akathisia:  No  Handed:  Right  AIMS (if indicated):UTA  Assets:  Communication Skills Desire for Improvement Housing Social Support  ADL's:  Intact  Cognition: WNL  Sleep:  Fair   Screenings: PHQ2-9     Office Visit from 07/17/2019 in [facility] Office Visit from 03/24/2019 in [facility] Office Visit from 11/08/2018 in [facility] Nutrition from 08/26/2018 in [facility] Office Visit from 05/09/2018 in [facility]  PHQ-2 Total Score  6  1  0  2  3  PHQ-9 Total Score  19  5  0  9  8       Assessment and Plan: CHAVA DULAC is a 44 year old African-American female, married, employed, lives in [place], has a history of bipolar disorder, sleep problems, OCD, hypothyroidism was evaluated by telemedicine today.  Patient is biologically predisposed given her history of mental health problems in her family, history of trauma.  Patient with current psychosocial stressors of death of her mother.  Patient will benefit from medication readjustment and psychotherapy sessions.  Plan as noted below.  Plan Bipolar disorder-depressive episode-type II-unstable Increase lamotrigine to 150 mg p.o. daily for 2 weeks.  She can increase it to 175 mg p.o. daily after 2 weeks. Celexa 40 mg p.o. daily Increase Seroquel to 50 mg p.o. nightly.  OCD-stable She will continue psychotherapy sessions. Celexa 40 mg p.o. daily  Insomnia-stable Continue Seroquel as prescribed She also has trazodone 50 mg p.o. nightly as needed.  Patient to continue psychotherapy sessions with wake counseling and medication.  Follow-up in clinic in 6 to 8 weeks or sooner if needed.  I have spent atleast 20 minutes non face to face with patient today. More than 50 % of the time was spent for preparing to see the patient ( e.g., review of test, records ), ordering medications and test ,psychoeducation and supportive psychotherapy and care coordination,as well as documenting clinical information in electronic health record. This note was generated in part or whole with voice recognition software. Voice recognition is usually quite accurate but there are transcription errors that can and very often do occur. I apologize for any typographical errors that were not detected and corrected.      Jomarie Longs, MD 08/27/2019, 5:03 PM

## 2019-08-28 ENCOUNTER — Other Ambulatory Visit (INDEPENDENT_AMBULATORY_CARE_PROVIDER_SITE_OTHER): Payer: Self-pay | Admitting: Family Medicine

## 2019-08-28 DIAGNOSIS — E038 Other specified hypothyroidism: Secondary | ICD-10-CM

## 2019-08-28 DIAGNOSIS — E559 Vitamin D deficiency, unspecified: Secondary | ICD-10-CM

## 2019-09-01 ENCOUNTER — Telehealth (INDEPENDENT_AMBULATORY_CARE_PROVIDER_SITE_OTHER): Payer: BC Managed Care – PPO | Admitting: Psychology

## 2019-09-01 ENCOUNTER — Other Ambulatory Visit: Payer: Self-pay

## 2019-09-01 DIAGNOSIS — F5089 Other specified eating disorder: Secondary | ICD-10-CM | POA: Diagnosis not present

## 2019-09-01 DIAGNOSIS — F319 Bipolar disorder, unspecified: Secondary | ICD-10-CM

## 2019-09-01 NOTE — Progress Notes (Signed)
Office: [phone]  /  Fax: [phone]    Date: September 15, 2019   Appointment Start Time: 2:03pm Duration: 23 minutes Provider: Glennie Isle, Psy.D. Type of Session: Individual Therapy  Location of Patient: Work Biomedical scientist of Provider: [facility] Type of Contact: Telepsychological Visit via Radiographer, therapeutic Visit/Telephone Call  Session Content: This provider called Kathryn Munoz at 2:02pm as MyChart Video Visit indicated she joined the appointment; however, there were no audio and visual capabilities on her end. As such, Kathryn Munoz provided verbal consent to proceed via a regular telephone call. As such, today's appointment was initiated 3 minutes late. Kathryn Munoz is a 44 y.o. female presenting via [place] call for a follow-up appointment to address the previously established treatment goal of increasing coping skills. Today's appointment was a telepsychological visit due to COVID-19. Kathryn Munoz provided verbal consent for today's telepsychological appointment and she is aware she is responsible for securing confidentiality on her end of the session. Prior to proceeding with today's appointment, Kathryn Munoz's physical location at the time of this appointment was obtained as well a phone number she could be reached at in the event of technical difficulties. Kathryn Munoz and this provider participated in today's telepsychological service.   This provider conducted a brief check-in. Kathryn Munoz shared she has been thinking about her mother frequently, which she feels has impacted her mood and eating habits. She indicated she continued to meet with her other therapist, noting their next appointment is in mid-August as her therapist is reportedly taking a "sabbatical." She added she can e-mail her as needed. She described some deviations from the meal plan, adding she has not gone grocery shopping recently. She reported willingness to using a food delivery service. Psychoeducation  regarding mindfulness was provided to assist with coping. A handout was provided to [facility] with further information regarding mindfulness, including exercises. This provider also explained the benefit of mindfulness as it relates to emotional eating. Kathryn Munoz was encouraged to engage in the provided exercises between now and the next appointment with this provider. Kathryn Munoz agreed. During today's appointment, Kathryn Munoz was led through a mindfulness exercise involving her senses. She noted, "I feel a lot of better." Kathryn Munoz provided verbal consent during today's appointment for this provider to send a handout about mindfulness via e-mail with exercises. Kathryn Munoz was receptive to today's appointment as evidenced by openness to sharing, responsiveness to feedback, and willingness to engage in mindfulness exercises to assist with coping.  Mental Status Examination:  Appearance: unable to assess  Behavior: unable to assess Mood: sad Affect: unable to fully assess Speech: normal in rate, volume, and tone Eye Contact: unable to assess Psychomotor Activity: unable to assess  Gait: unable to assess Thought Process: linear, logical, and goal directed  Thought Content/Perception: denies suicidal and homicidal ideation, plan, and intent and no hallucinations, delusions, bizarre thinking or behavior reported or observed Orientation: time, person, place, and purpose of appointment Memory/Concentration: memory, attention, language, and fund of knowledge intact  Insight/Judgment: good  Interventions:  Conducted a brief chart review Conducted a risk assessment Provided empathic reflections and validation Employed supportive psychotherapy interventions to facilitate reduced distress and to improve coping skills with identified stressors Employed motivational interviewing skills to assess patient's willingness/desire to adhere to recommended medical treatments and assignments Psychoeducation provided regarding  mindfulness Engaged patient in mindfulness exercise(s) Employed acceptance and commitment interventions to emphasize mindfulness and acceptance without struggle  DSM-5 Diagnosis(es): 307.59 (F50.8) Other Specified Feeding or Eating Disorder, Emotional Eating Behaviors and 296.80 (F31.9) Unspecified Bipolar  and Related Disorder  Treatment Goal & Progress: During the initial appointment with this provider, the following treatment goal was established: increase coping skills. Kathryn Munoz has demonstrated progress in her goal as evidenced by increased awareness of hunger patterns and increased awareness of triggers for emotional eating. Kathryn Munoz also continues to demonstrate willingness to engage in learned skill(s).  Plan: The next appointment will be scheduled in approximately two weeks, which will be via MyChart Video Visit. The next session will focus on working towards the established treatment goal.

## 2019-09-04 ENCOUNTER — Ambulatory Visit (INDEPENDENT_AMBULATORY_CARE_PROVIDER_SITE_OTHER): Payer: BC Managed Care – PPO | Admitting: Adult Health

## 2019-09-04 ENCOUNTER — Encounter (INDEPENDENT_AMBULATORY_CARE_PROVIDER_SITE_OTHER): Payer: Self-pay | Admitting: Adult Health

## 2019-09-04 ENCOUNTER — Other Ambulatory Visit: Payer: Self-pay

## 2019-09-04 VITALS — BP 109/70 | HR 67 | Temp 98.6°F | Ht 65.0 in | Wt 237.0 lb

## 2019-09-04 DIAGNOSIS — E038 Other specified hypothyroidism: Secondary | ICD-10-CM

## 2019-09-04 DIAGNOSIS — Z9189 Other specified personal risk factors, not elsewhere classified: Secondary | ICD-10-CM

## 2019-09-04 DIAGNOSIS — Z6839 Body mass index (BMI) 39.0-39.9, adult: Secondary | ICD-10-CM

## 2019-09-04 DIAGNOSIS — E559 Vitamin D deficiency, unspecified: Secondary | ICD-10-CM | POA: Diagnosis not present

## 2019-09-04 MED ORDER — VITAMIN D (ERGOCALCIFEROL) 1.25 MG (50000 UNIT) PO CAPS: 50000.0000 [IU] | ORAL_CAPSULE | ORAL | 0 refills | Status: DC

## 2019-09-04 MED ORDER — LEVOTHYROXINE SODIUM 75 MCG PO TABS: 75.0000 ug | ORAL_TABLET | Freq: Every day | ORAL | 0 refills | Status: DC

## 2019-09-09 NOTE — Progress Notes (Signed)
Chief Complaint:   OBESITY Kathryn Munoz is here to discuss her progress with her obesity treatment plan along with follow-up of her obesity related diagnoses. Kathryn Munoz is on the Category 3 Plan and states she is following her eating plan approximately 90% of the time. Kathryn Munoz states she is doing 0 minutes 0 times per week.  Today's visit was #: 4 Starting weight: 252 lbs Starting date: 07/17/2019 Today's weight: 237 lbs Today's date: 09/04/2019 Total lbs lost to date: 15 Total lbs lost since last in-office visit: 6  Interim History: Kathryn Munoz reports reduction in fatigue and that her clothes are fitting better. She is able to eat all of the food on the plan, and she denies polyphagia.  Subjective:   1. Vitamin D deficiency Kathryn Munoz's Vitamin D level was 20.1 on 07/17/2019. She is currently taking prescription vitamin D 50,000 IU each week. She denies nausea, vomiting or muscle weakness. I discussed labs with the patient today.  2. Other specified hypothyroidism Kathryn Munoz denies increased fatigue levels. Last thyroid panel was stable. I discussed labs with the patient.  3. At risk for complication associated with hypotension The patient is at a higher than average risk of hypotension due to weight loss and she denies symptoms of hypotension.  Assessment/Plan:   1. Vitamin D deficiency Low Vitamin D level contributes to fatigue and are associated with obesity, breast, and colon cancer. We will refill prescription Vitamin D for 1 month. Kathryn Munoz will follow-up for routine testing of Vitamin D, at least 2-3 times per year to avoid over-replacement.  - Vitamin D, Ergocalciferol, (DRISDOL) 1.25 MG (50000 UNIT) CAPS capsule; Take 1 capsule (50,000 Units total) by mouth every 7 (seven) days.  Dispense: 4 capsule; Refill: 0  2. Other specified hypothyroidism Kathryn Munoz is with long-standing hypothyroidism, and we will refill levothyroxine for 1 month. She appears euthyroid. Orders and follow up as  documented in patient record.  Counseling . Good thyroid control is important for overall health. Supratherapeutic thyroid levels are dangerous and will not improve weight loss results. . The correct way to take levothyroxine is fasting, with water, separated by at least 30 minutes from breakfast, and separated by more than 4 hours from calcium, iron, multivitamins, acid reflux medications (PPIs).   - levothyroxine (SYNTHROID) 75 MCG tablet; Take 1 tablet (75 mcg total) by mouth daily before breakfast.  Dispense: 30 tablet; Refill: 0  3. At risk for complication associated with hypotension Kathryn Munoz was given approximately 15 minutes of education and counseling today to help avoid hypotension. We discussed risks of hypotension with weight loss and signs of hypotension such as feeling lightheaded or unsteady.  Repetitive spaced learning was employed today to elicit superior memory formation and behavioral change.  4. Class 2 severe obesity with serious comorbidity and body mass index (BMI) of 39.0 to 39.9 in adult, unspecified obesity type [facility]) Kathryn Munoz is currently in the action stage of change. As such, her goal is to continue with weight loss efforts. She has agreed to the Category 3 Plan.   Handout provided today: Additional Breakfast Options.  Exercise goals: No exercise has been prescribed at this time.  Behavioral modification strategies: increasing lean protein intake, meal planning and cooking strategies and planning for success.  Munoz has agreed to follow-up with our clinic in 2 weeks. She was informed of the importance of frequent follow-up visits to maximize her success with intensive lifestyle modifications for her multiple health conditions.   Objective:   Blood pressure 109/70, pulse 67,  temperature 98.6 F (37 C), temperature source Oral, height 5\' 5"  (1.651 m), weight 237 lb (107.5 kg), SpO2 97 %. Body mass index is 39.44 kg/m.  General: Cooperative, alert, well  developed, in no acute distress. HEENT: Conjunctivae and lids unremarkable. Cardiovascular: Regular rhythm.  Lungs: Normal work of breathing. Neurologic: No focal deficits.   Lab Results  Component Value Date   CREATININE 0.83 07/17/2019   BUN 5 (L) 07/17/2019   NA 138 07/17/2019   K 4.5 07/17/2019   CL 104 07/17/2019   CO2 20 07/17/2019   Lab Results  Component Value Date   ALT 22 07/17/2019   AST 31 07/17/2019   ALKPHOS 52 07/17/2019   BILITOT 0.5 07/17/2019   Lab Results  Component Value Date   HGBA1C 5.5 07/17/2019   HGBA1C 5.7 (H) 03/31/2019   HGBA1C 4.8 05/09/2018   HGBA1C 5.5 10/22/2017   HGBA1C 4.7 10/11/2015   Lab Results  Component Value Date   INSULIN 19.6 07/17/2019   Lab Results  Component Value Date   TSH 2.210 07/17/2019   Lab Results  Component Value Date   CHOL 193 07/17/2019   HDL 39 (L) 07/17/2019   LDLCALC 129 (H) 07/17/2019   TRIG 136 07/17/2019   CHOLHDL 3.7 03/31/2019   Lab Results  Component Value Date   WBC 8.0 07/17/2019   HGB 12.6 07/17/2019   HCT 38.6 07/17/2019   MCV 98 (H) 07/17/2019   PLT 319 07/17/2019   No results found for: IRON, TIBC, FERRITIN  Attestation Statements:   Reviewed by clinician on day of visit: allergies, medications, problem list, medical history, surgical history, family history, social history, and previous encounter notes.   07/19/2019, am acting as Trude Mcburney for Energy manager, NP-C.  I have reviewed the above documentation for accuracy and completeness, and I agree with the above. -  [employer], NP

## 2019-09-15 ENCOUNTER — Other Ambulatory Visit: Payer: Self-pay

## 2019-09-15 ENCOUNTER — Telehealth (INDEPENDENT_AMBULATORY_CARE_PROVIDER_SITE_OTHER): Payer: BC Managed Care – PPO | Admitting: Psychology

## 2019-09-15 DIAGNOSIS — F5089 Other specified eating disorder: Secondary | ICD-10-CM | POA: Diagnosis not present

## 2019-09-15 DIAGNOSIS — F319 Bipolar disorder, unspecified: Secondary | ICD-10-CM | POA: Diagnosis not present

## 2019-09-16 NOTE — Progress Notes (Signed)
  Office: [phone]  /  Fax: [phone]    Date: September 30, 2019   Appointment Start Time: 1:56pm Duration: 22 minutes Provider: Lawerance Cruel, Psy.D. Type of Session: Individual Therapy  Location of Patient: Parked in car outside of work Location of Provider: [facility] Type of Contact: Telepsychological Visit via MyChart Video Visit  Session Content: Kathryn Munoz is a 44 y.o. female presenting via MyChart Video Visit for a follow-up appointment to address the previously established treatment goal of increasing coping skills. Today's appointment was a telepsychological visit due to COVID-19. Kathryn Munoz provided verbal consent for today's telepsychological appointment and she is aware she is responsible for securing confidentiality on her end of the session. Prior to proceeding with today's appointment, Kathryn Munoz's physical location at the time of this appointment was obtained as well a phone number she could be reached at in the event of technical difficulties. Kathryn Munoz and this provider participated in today's telepsychological service. Of note, today's appointment was switched to a regular telephone call with [name]'s verbal consent at 1:57pm due to connection issues.   This provider conducted a brief check-in. [name] reported things are going better since the last appointment with this provider. Regarding eating, she stated she went grocery shopping this week to assist with planning. Notably, [name] reported she has been feeling "shaky" in the past couple days between breakfast and lunch. She was receptive to sending Kathryn Hamburger, NP a MyChart message. Session focused further on mindfulness to assist with coping. Kathryn Munoz stated she is more mindful of hunger patterns versus mindlessly eating. Kathryn Munoz was led through a mindfulness exercise (A Taste of Mindfulness) and her experience was processed. She noted, "It felt good." Kathryn Munoz provided verbal consent during today's appointment  for this provider to send the handout for today's exercise via e-mail. This provider also discussed the utilization of YouTube for mindfulness exercises (e.g., exercises by Rhae Hammock). Kathryn Munoz was receptive to today's appointment as evidenced by openness to sharing, responsiveness to feedback, and willingness to continue engaging in mindfulness exercises.  Mental Status Examination:  Appearance: well groomed and appropriate hygiene  Behavior: appropriate to circumstances Mood: euthymic Affect: mood congruent Speech: normal in rate, volume, and tone Eye Contact: appropriate Psychomotor Activity: appropriate Gait: unable to assess Thought Process: linear, logical, and goal directed  Thought Content/Perception: no hallucinations, delusions, bizarre thinking or behavior reported or observed and no evidence of suicidal and homicidal ideation, plan, and intent Orientation: time, person, place, and purpose of appointment Memory/Concentration: memory, attention, language, and fund of knowledge intact  Insight/Judgment: good  Interventions:  Conducted a brief chart review Provided empathic reflections and validation Employed supportive psychotherapy interventions to facilitate reduced distress and to improve coping skills with identified stressors Engaged patient in mindfulness exercise(s) Employed acceptance and commitment interventions to emphasize mindfulness and acceptance without struggle  DSM-5 Diagnosis(es): 307.59 (F50.8) Other Specified Feeding or Eating Disorder, Emotional Eating Behaviors and 296.80 (F31.9) Unspecified Bipolar and Related Disorder  Treatment Goal & Progress: During the initial appointment with this provider, the following treatment goal was established: increase coping skills. Kathryn Munoz has demonstrated progress in her goal as evidenced by increased awareness of hunger patterns, increased awareness of triggers for emotional eating and reduction in emotional eating.  Kathryn Munoz also continues to demonstrate willingness to engage in learned skill(s).  Plan: The next appointment will be scheduled in approximately two weeks, which will be via MyChart Video Visit. The next session will focus on working towards the established treatment goal.

## 2019-09-18 ENCOUNTER — Encounter (INDEPENDENT_AMBULATORY_CARE_PROVIDER_SITE_OTHER): Payer: Self-pay

## 2019-09-23 ENCOUNTER — Other Ambulatory Visit: Payer: Self-pay

## 2019-09-23 ENCOUNTER — Ambulatory Visit (INDEPENDENT_AMBULATORY_CARE_PROVIDER_SITE_OTHER): Payer: BC Managed Care – PPO | Admitting: Adult Health

## 2019-09-23 ENCOUNTER — Encounter (INDEPENDENT_AMBULATORY_CARE_PROVIDER_SITE_OTHER): Payer: Self-pay | Admitting: Adult Health

## 2019-09-23 VITALS — BP 111/75 | HR 72 | Temp 98.4°F | Ht 65.0 in | Wt 241.0 lb

## 2019-09-23 DIAGNOSIS — E038 Other specified hypothyroidism: Secondary | ICD-10-CM

## 2019-09-23 DIAGNOSIS — E559 Vitamin D deficiency, unspecified: Secondary | ICD-10-CM

## 2019-09-23 DIAGNOSIS — Z6841 Body Mass Index (BMI) 40.0 and over, adult: Secondary | ICD-10-CM

## 2019-09-23 DIAGNOSIS — Z9189 Other specified personal risk factors, not elsewhere classified: Secondary | ICD-10-CM

## 2019-09-23 MED ORDER — LEVOTHYROXINE SODIUM 75 MCG PO TABS: 75.0000 ug | ORAL_TABLET | Freq: Every day | ORAL | 0 refills | Status: DC

## 2019-09-23 MED ORDER — VITAMIN D (ERGOCALCIFEROL) 1.25 MG (50000 UNIT) PO CAPS: 50000.0000 [IU] | ORAL_CAPSULE | ORAL | 0 refills | Status: DC

## 2019-09-23 NOTE — Progress Notes (Signed)
Chief Complaint:   OBESITY Kathryn Munoz is here to discuss her progress with her obesity treatment plan along with follow-up of her obesity related diagnoses. Kathryn Munoz is on the Category 3 Plan and states she is following her eating plan approximately 85% of the time. Kathryn Munoz states she is walking 20 minutes 3 times per week.  Today's visit was #: 5 Starting weight: 252 lbs Starting date: 07/17/2019 Today's weight: 241 lbs Today's date: 09/23/2019 Total lbs lost to date: 11 Total lbs lost since last in-office visit: 0  Interim History: Kathryn Munoz deviated from the plan with food at a baseball game and at a family cookout, however, she focused on portion control and protein intake - great! She continues to struggle with grief over the recent passing of her mother in March. She is working with Dr. Dewaine Conger biweekly and her regular therapist is on sabbatical until mid-August.  Subjective:   Other specified hypothyroidism. Last thyroid panel was stable. Kathryn Munoz denies excessive fatigue.  Lab Results  Component Value Date   TSH 2.210 07/17/2019   Vitamin D deficiency. Kathryn Munoz is on prescription strength Vitamin D supplementation and tolerating it well. She denies nausea/vomiting/muscle weakness.    Ref. Range 07/17/2019 12:49  Vitamin D, 25-Hydroxy Latest Ref Range: 30.0 - 100.0 ng/mL 20.1 (L)   At risk for osteoporosis. Kathryn Munoz is at higher risk of osteopenia and osteoporosis due to Vitamin D deficiency and obesity.   Assessment/Plan:   Other specified hypothyroidism. Patient with long-standing hypothyroidism, on levothyroxine therapy. She appears euthyroid. Orders and follow up as documented in patient record. Refill was given for levothyroxine (SYNTHROID) 75 MCG tablet daily #30 with 0 refills.  Counseling . Good thyroid control is important for overall health. Supratherapeutic thyroid levels are dangerous and will not improve weight loss results. . The correct way to take  levothyroxine is fasting, with water, separated by at least 30 minutes from breakfast, and separated by more than 4 hours from calcium, iron, multivitamins, acid reflux medications (PPIs).    Vitamin D deficiency. Low Vitamin D level contributes to fatigue and are associated with obesity, breast, and colon cancer. She was given a refill on her Vitamin D, Ergocalciferol, (DRISDOL) 1.25 MG (50000 UNIT) CAPS capsule every week #4 with 0 refills and will follow-up for routine testing of Vitamin D at her next office visit.   At risk for osteoporosis. Kathryn Munoz was given approximately 15 minutes of osteoporosis prevention counseling today. Kathryn Munoz is at risk for osteopenia and osteoporosis due to her Vitamin D deficiency. She was encouraged to take her Vitamin D and follow her higher calcium diet and increase strengthening exercise to help strengthen her bones and decrease her risk of osteopenia and osteoporosis.  Repetitive spaced learning was employed today to elicit superior memory formation and behavioral change.  Class 3 severe obesity with serious comorbidity and body mass index (BMI) of 40.0 to 44.9 in adult, unspecified obesity type (HCC).  Kathryn Munoz is currently in the action stage of change. As such, her goal is to continue with weight loss efforts. She has agreed to the Category 3 Plan.   Exercise goals: Kathryn Munoz will continue her current exercise regimen.   Behavioral modification strategies: increasing lean protein intake, meal planning and cooking strategies, better snacking choices and planning for success.  Kathryn Munoz has agreed to follow-up with our clinic in 2 weeks. She was informed of the importance of frequent follow-up visits to maximize her success with intensive lifestyle modifications for her multiple health  conditions.   Objective:   Blood pressure 111/75, pulse 72, temperature 98.4 F (36.9 C), temperature source Oral, height 5\' 5"  (1.651 m), weight 241 lb (109.3 kg), SpO2 98  %. Body mass index is 40.1 kg/m.  General: Cooperative, alert, well developed, in no acute distress. HEENT: Conjunctivae and lids unremarkable. Cardiovascular: Regular rhythm.  Lungs: Normal work of breathing. Neurologic: No focal deficits.   Lab Results  Component Value Date   CREATININE 0.83 07/17/2019   BUN 5 (L) 07/17/2019   NA 138 07/17/2019   K 4.5 07/17/2019   CL 104 07/17/2019   CO2 20 07/17/2019   Lab Results  Component Value Date   ALT 22 07/17/2019   AST 31 07/17/2019   ALKPHOS 52 07/17/2019   BILITOT 0.5 07/17/2019   Lab Results  Component Value Date   HGBA1C 5.5 07/17/2019   HGBA1C 5.7 (H) 03/31/2019   HGBA1C 4.8 05/09/2018   HGBA1C 5.5 10/22/2017   HGBA1C 4.7 10/11/2015   Lab Results  Component Value Date   INSULIN 19.6 07/17/2019   Lab Results  Component Value Date   TSH 2.210 07/17/2019   Lab Results  Component Value Date   CHOL 193 07/17/2019   HDL 39 (L) 07/17/2019   LDLCALC 129 (H) 07/17/2019   TRIG 136 07/17/2019   CHOLHDL 3.7 03/31/2019   Lab Results  Component Value Date   WBC 8.0 07/17/2019   HGB 12.6 07/17/2019   HCT 38.6 07/17/2019   MCV 98 (H) 07/17/2019   PLT 319 07/17/2019   No results found for: IRON, TIBC, FERRITIN  Attestation Statements:   Reviewed by clinician on day of visit: allergies, medications, problem list, medical history, surgical history, family history, social history, and previous encounter notes.  I, 07/19/2019, am acting as Marianna Payment for Energy manager, NP-C   I have reviewed the above documentation for accuracy and completeness, and I agree with the above. -  [employer], NP

## 2019-09-26 ENCOUNTER — Encounter: Payer: Self-pay | Admitting: Family Medicine

## 2019-09-29 ENCOUNTER — Other Ambulatory Visit (INDEPENDENT_AMBULATORY_CARE_PROVIDER_SITE_OTHER): Payer: Self-pay | Admitting: Adult Health

## 2019-09-29 DIAGNOSIS — E559 Vitamin D deficiency, unspecified: Secondary | ICD-10-CM

## 2019-09-30 ENCOUNTER — Other Ambulatory Visit: Payer: Self-pay

## 2019-09-30 ENCOUNTER — Telehealth (INDEPENDENT_AMBULATORY_CARE_PROVIDER_SITE_OTHER): Payer: BC Managed Care – PPO | Admitting: Psychology

## 2019-09-30 DIAGNOSIS — F5089 Other specified eating disorder: Secondary | ICD-10-CM | POA: Diagnosis not present

## 2019-09-30 DIAGNOSIS — F319 Bipolar disorder, unspecified: Secondary | ICD-10-CM | POA: Diagnosis not present

## 2019-10-01 NOTE — Progress Notes (Signed)
  Office: [phone]  /  Fax: [phone]    Date: October 15, 2019   Appointment Start Time: 1:56pm Duration: 20 minutes Provider: Lawerance Cruel, Psy.D. Type of Session: Individual Therapy  Location of Patient: Parked in car at work Location of Provider: Provider's Home Type of Contact: Telepsychological Visit via MyChart Video Visit  Session Content: Shila is a 44 y.o. female presenting via MyChart Video Visit for a follow-up appointment to address the previously established treatment goal of increasing coping skills. Today's appointment was a telepsychological visit due to COVID-19. Kenika provided verbal consent for today's telepsychological appointment and she is aware she is responsible for securing confidentiality on her end of the session. Prior to proceeding with today's appointment, Leronda's physical location at the time of this appointment was obtained as well a phone number she could be reached at in the event of technical difficulties. Tanetta and this provider participated in today's telepsychological service.   This provider conducted a brief check-in. Kyndall shared, "Things are going well." She shared about her recent ER visit, noting symptoms have started subsiding. She expressed desire to increase physical activity and discussed plans to do so. Session focused further on mindfulness to assist with coping. Griselda stated she continues to engage in mindfulness exercises, adding, "I can feel my stress levels going down." Positive reinforcement was provided. Hayslee was led through a mindfulness exercise (10 minute sitting) and her experience was processed.  Furthermore, termination planning was discussed. Agness was receptive to a follow-up appointment in 3-4 weeks and an additional follow-up/termination appointment in 3-4 weeks after that. Piper was receptive to today's appointment as evidenced by openness to sharing, responsiveness to feedback, and willingness to continue  engaging in mindfulness exercises.  Mental Status Examination:  Appearance: well groomed and appropriate hygiene  Behavior: appropriate to circumstances Mood: euthymic Affect: mood congruent Speech: normal in rate, volume, and tone Eye Contact: appropriate Psychomotor Activity: appropriate Gait: unable to assess Thought Process: linear, logical, and goal directed  Thought Content/Perception: no hallucinations, delusions, bizarre thinking or behavior reported or observed and no evidence of suicidal and homicidal ideation, plan, and intent Orientation: time, person, place, and purpose of appointment Memory/Concentration: memory, attention, language, and fund of knowledge intact  Insight/Judgment: good   Interventions:  Conducted a brief chart review Provided empathic reflections and validation Employed supportive psychotherapy interventions to facilitate reduced distress and to improve coping skills with identified stressors Engaged patient in mindfulness exercise(s) Employed acceptance and commitment interventions to emphasize mindfulness and acceptance without struggle Discussed termination planning  DSM-5 Diagnosis(es): 307.59 (F50.8) Other Specified Feeding or Eating Disorder, Emotional Eating Behaviors and 296.80 (F31.9) Unspecified Bipolar and Related Disorder  Treatment Goal & Progress: During the initial appointment with this provider, the following treatment goal was established: increase coping skills. Kabrea has demonstrated progress in her goal as evidenced by increased awareness of hunger patterns and increased awareness of triggers for emotional eating. Kaleyah also continues to demonstrate willingness to engage in learned skill(s).  Plan: The next appointment will be scheduled in three weeks, which will be via MyChart Video Visit. The next session will focus on working towards the established treatment goal. Additionally, Dalayna continues to meet with another mental health  provider with [facility], noting their next appointment is November 11, 2019.

## 2019-10-13 ENCOUNTER — Ambulatory Visit: Payer: Self-pay | Admitting: *Deleted

## 2019-10-13 NOTE — Telephone Encounter (Signed)
Pt called in c/o dizziness that started Saturday during intercourse.   It just came on her suddenly.   She is having to hold onto the walls and objects to keep her balance enough to ambulate around the house.    She has noticed starting last week she is having to use her reading glasses more and her vision seems to have changed.   She had one headache and took medication and it went away.  I have referred her to the ED per the protocol.  Going to [facility] ED.   Husband taking her.    I sent my notes to [facility] Shea Tapia's office.   Reason for Disposition  SEVERE dizziness (e.g., unable to stand, requires support to walk, feels like passing out now)    Having to hold onto the walls to ambulate.  Answer Assessment - Initial Assessment Questions 1. DESCRIPTION: "Describe your dizziness."     Started Saturday.   I had vertigo started immediately.   Nausea.   I'm still off balance.   If I turn my head the room spins. Earlier this week I've needed my reading glasses more.  I had a headache that I took medicine and it helped. 2 wks ago I had a cold. 2. LIGHTHEADED: "Do you feel lightheaded?" (e.g., somewhat faint, woozy, weak upon standing)     No 3. VERTIGO: "Do you feel like either you or the room is spinning or tilting?" (i.e. vertigo)     The room is spinning.  Changes position makes  it worse. 4. SEVERITY: "How bad is it?"  "Do you feel like you are going to faint?" "Can you stand and walk?"   - MILD: Feels slightly dizzy, but walking normally.   - MODERATE: Feels very unsteady when walking, but not falling; interferes with normal activities (e.g., school, work) .   - SEVERE: Unable to walk without falling, or requires assistance to walk without falling; feels like passing out now.      I'm having to hold onto the wall. 5. ONSET:  "When did the dizziness begin?"     Saturday it started during intercourse. 6. AGGRAVATING FACTORS: "Does anything make it worse?" (e.g., standing, change in  head position)     Changing positions 7. HEART RATE: "Can you tell me your heart rate?" "How many beats in 15 seconds?"  (Note: not all patients can do this)       Not asked 8. CAUSE: "What do you think is causing the dizziness?"     I don't know 9. RECURRENT SYMPTOM: "Have you had dizziness before?" If Yes, ask: "When was the last time?" "What happened that time?"     No 10. OTHER SYMPTOMS: "Do you have any other symptoms?" (e.g., fever, chest pain, vomiting, diarrhea, bleeding)       No other symptoms except I'm needing my reading glasses more. 11. PREGNANCY: "Is there any chance you are pregnant?" "When was your last menstrual period?"       Not asked  Protocols used: DIZZINESS [facility]

## 2019-10-14 ENCOUNTER — Ambulatory Visit (INDEPENDENT_AMBULATORY_CARE_PROVIDER_SITE_OTHER): Payer: BC Managed Care – PPO | Admitting: Bariatrics

## 2019-10-14 ENCOUNTER — Encounter (INDEPENDENT_AMBULATORY_CARE_PROVIDER_SITE_OTHER): Payer: Self-pay | Admitting: Bariatrics

## 2019-10-14 ENCOUNTER — Other Ambulatory Visit: Payer: Self-pay

## 2019-10-14 VITALS — BP 111/74 | HR 70 | Temp 98.5°F | Ht 65.0 in | Wt 230.0 lb

## 2019-10-14 DIAGNOSIS — E559 Vitamin D deficiency, unspecified: Secondary | ICD-10-CM | POA: Diagnosis not present

## 2019-10-14 DIAGNOSIS — Z6838 Body mass index (BMI) 38.0-38.9, adult: Secondary | ICD-10-CM

## 2019-10-14 DIAGNOSIS — E8881 Metabolic syndrome: Secondary | ICD-10-CM | POA: Diagnosis not present

## 2019-10-14 DIAGNOSIS — Z9189 Other specified personal risk factors, not elsewhere classified: Secondary | ICD-10-CM | POA: Diagnosis not present

## 2019-10-14 NOTE — Progress Notes (Signed)
Chief Complaint:   OBESITY Kathryn Munoz is here to discuss her progress with her obesity treatment plan along with follow-up of her obesity related diagnoses. Kathryn Munoz is on the Category 3 Plan and states she is following her eating plan approximately 75% of the time. Kathryn Munoz states she is exercising 0 minutes 0 times per week.  Today's visit was #: 6 Starting weight: 252 lbs Starting date: 07/17/2019 Today's weight: 230 lbs Today's date: 10/14/2019 Total lbs lost to date: 22 Total lbs lost since last in-office visit: 11  Interim History: Kathryn Munoz is down 11 lbs.  Subjective:   Vitamin D deficiency. Kathryn Munoz is taking Vitamin D supplementation.    Ref. Range 07/17/2019 12:49  Vitamin D, 25-Hydroxy Latest Ref Range: 30.0 - 100.0 ng/mL 20.1 (L)   Insulin resistance. Kathryn Munoz has a diagnosis of insulin resistance based on her elevated fasting insulin level >5. She continues to work on diet and exercise to decrease her risk of diabetes. She is taking metformin and denies polyphagia.  Lab Results  Component Value Date   INSULIN 19.6 07/17/2019   Lab Results  Component Value Date   HGBA1C 5.5 07/17/2019   At risk for hypoglycemia. Kathryn Munoz is at increased risk for hypoglycemia due to insulin resistance.  Assessment/Plan:   Vitamin D deficiency. Low Vitamin D level contributes to fatigue and are associated with obesity, breast, and colon cancer. She agrees to continue to take Vitamin D as directed and VITAMIN D 25 Hydroxy (Vit-D Deficiency, Fractures) level will be checked today.  Insulin resistance. Kathryn Munoz will continue to work on weight loss, exercise, and decreasing simple carbohydrates to help decrease the risk of diabetes. Kathryn Munoz agreed to follow-up with [place] as directed to closely monitor her progress. Comprehensive metabolic panel, Hemoglobin A1c, Insulin, random, Lipid Panel With LDL/HDL Ratio labs will be checked today. She was instructed to eat small, frequent  meals.  At risk for hypoglycemia. Kathryn Munoz was given approximately 15 minutes of counseling today regarding prevention of hypoglycemia. She was advised of symptoms of hypoglycemia. Kathryn Munoz was instructed to avoid skipping meals, eat regular protein rich meals and schedule low calorie snacks as needed. She was instructed to eat small, frequent meals.  Repetitive spaced learning was employed today to elicit superior memory formation and behavioral change.  Class 2 severe obesity with serious comorbidity and body mass index (BMI) of 38.0 to 38.9 in adult, unspecified obesity type (HCC).  Kathryn Munoz is currently in the action stage of change. As such, her goal is to continue with weight loss efforts. She has agreed to the Category 3 Plan.   She will work on meal planning, intentional eating, and increasing her water intake.   Exercise goals: Kathryn Munoz was advised to be more active overall.  Behavioral modification strategies: increasing lean protein intake, decreasing simple carbohydrates, increasing vegetables, increasing water intake, decreasing eating out, no skipping meals, meal planning and cooking strategies, keeping healthy foods in the home and planning for success.  Kathryn Munoz has agreed to follow-up with our clinic in 2 weeks. She was informed of the importance of frequent follow-up visits to maximize her success with intensive lifestyle modifications for her multiple health conditions.   Objective:   Blood pressure 111/74, pulse 70, temperature 98.5 F (36.9 C), height 5\' 5"  (1.651 m), weight (!) 230 lb (104.3 kg), SpO2 98 %. Body mass index is 38.27 kg/m.  General: Cooperative, alert, well developed, in no acute distress. HEENT: Conjunctivae and lids unremarkable. Cardiovascular: Regular rhythm.  Lungs: Normal  work of breathing. Neurologic: No focal deficits.   Lab Results  Component Value Date   CREATININE 0.83 07/17/2019   BUN 5 (L) 07/17/2019   NA 138 07/17/2019   K 4.5  07/17/2019   CL 104 07/17/2019   CO2 20 07/17/2019   Lab Results  Component Value Date   ALT 22 07/17/2019   AST 31 07/17/2019   ALKPHOS 52 07/17/2019   BILITOT 0.5 07/17/2019   Lab Results  Component Value Date   HGBA1C 5.5 07/17/2019   HGBA1C 5.7 (H) 03/31/2019   HGBA1C 4.8 05/09/2018   HGBA1C 5.5 10/22/2017   HGBA1C 4.7 10/11/2015   Lab Results  Component Value Date   INSULIN 19.6 07/17/2019   Lab Results  Component Value Date   TSH 2.210 07/17/2019   Lab Results  Component Value Date   CHOL 193 07/17/2019   HDL 39 (L) 07/17/2019   LDLCALC 129 (H) 07/17/2019   TRIG 136 07/17/2019   CHOLHDL 3.7 03/31/2019   Lab Results  Component Value Date   WBC 8.0 07/17/2019   HGB 12.6 07/17/2019   HCT 38.6 07/17/2019   MCV 98 (H) 07/17/2019   PLT 319 07/17/2019   No results found for: IRON, TIBC, FERRITIN  Attestation Statements:   Reviewed by clinician on day of visit: allergies, medications, problem list, medical history, surgical history, family history, social history, and previous encounter notes.  Fernanda Drum, am acting as Energy manager for [employer], DO   I have reviewed the above documentation for accuracy and completeness, and I agree with the above. Corinna Capra, DO

## 2019-10-15 ENCOUNTER — Telehealth (INDEPENDENT_AMBULATORY_CARE_PROVIDER_SITE_OTHER): Payer: BC Managed Care – PPO | Admitting: Psychology

## 2019-10-15 DIAGNOSIS — F5089 Other specified eating disorder: Secondary | ICD-10-CM

## 2019-10-15 DIAGNOSIS — F319 Bipolar disorder, unspecified: Secondary | ICD-10-CM | POA: Diagnosis not present

## 2019-10-15 LAB — HEMOGLOBIN A1C
Est. average glucose Bld gHb Est-mCnc: 103 mg/dL
Hgb A1c MFr Bld: 5.2 % (ref 4.8–5.6)

## 2019-10-15 LAB — COMPREHENSIVE METABOLIC PANEL
ALT: 13 IU/L (ref 0–32)
AST: 16 IU/L (ref 0–40)
Albumin/Globulin Ratio: 1.5 (ref 1.2–2.2)
Albumin: 4.4 g/dL (ref 3.8–4.8)
Alkaline Phosphatase: 65 IU/L (ref 48–121)
BUN/Creatinine Ratio: 9 (ref 9–23)
BUN: 9 mg/dL (ref 6–24)
Bilirubin Total: 0.4 mg/dL (ref 0.0–1.2)
CO2: 22 mmol/L (ref 20–29)
Calcium: 9.3 mg/dL (ref 8.7–10.2)
Chloride: 104 mmol/L (ref 96–106)
Creatinine, Ser: 0.97 mg/dL (ref 0.57–1.00)
GFR calc Af Amer: 83 mL/min/{1.73_m2} (ref >59)
GFR calc non Af Amer: 72 mL/min/{1.73_m2} (ref >59)
Globulin, Total: 3 g/dL (ref 1.5–4.5)
Glucose: 94 mg/dL (ref 65–99)
Potassium: 4.6 mmol/L (ref 3.5–5.2)
Sodium: 139 mmol/L (ref 134–144)
Total Protein: 7.4 g/dL (ref 6.0–8.5)

## 2019-10-15 LAB — INSULIN, RANDOM: INSULIN: 32.5 u[IU]/mL — ABNORMAL HIGH (ref 2.6–24.9)

## 2019-10-15 LAB — LIPID PANEL WITH LDL/HDL RATIO
Cholesterol, Total: 186 mg/dL (ref 100–199)
HDL: 55 mg/dL (ref >39)
LDL Chol Calc (NIH): 113 mg/dL — ABNORMAL HIGH (ref 0–99)
LDL/HDL Ratio: 2.1 ratio (ref 0.0–3.2)
Triglycerides: 102 mg/dL (ref 0–149)
VLDL Cholesterol Cal: 18 mg/dL (ref 5–40)

## 2019-10-15 LAB — VITAMIN D 25 HYDROXY (VIT D DEFICIENCY, FRACTURES): Vit D, 25-Hydroxy: 55.7 ng/mL (ref 30.0–100.0)

## 2019-10-21 ENCOUNTER — Telehealth (INDEPENDENT_AMBULATORY_CARE_PROVIDER_SITE_OTHER): Payer: BC Managed Care – PPO | Admitting: Psychiatry

## 2019-10-21 ENCOUNTER — Encounter: Payer: Self-pay | Admitting: Psychiatry

## 2019-10-21 ENCOUNTER — Other Ambulatory Visit: Payer: Self-pay

## 2019-10-21 DIAGNOSIS — F429 Obsessive-compulsive disorder, unspecified: Secondary | ICD-10-CM

## 2019-10-21 DIAGNOSIS — F5105 Insomnia due to other mental disorder: Secondary | ICD-10-CM

## 2019-10-21 DIAGNOSIS — F3176 Bipolar disorder, in full remission, most recent episode depressed: Secondary | ICD-10-CM

## 2019-10-21 MED ORDER — QUETIAPINE FUMARATE 50 MG PO TABS: 50.0000 mg | ORAL_TABLET | Freq: Every day | ORAL | 0 refills | Status: DC

## 2019-10-21 MED ORDER — LAMOTRIGINE 25 MG PO TABS: 25.0000 mg | ORAL_TABLET | Freq: Every day | ORAL | 0 refills | Status: DC

## 2019-10-21 NOTE — Progress Notes (Signed)
Provider Location : [facility] Patient Location : Home  Virtual Visit via Video Note  I connected with Kathryn Munoz on 10/21/19 at  2:40 PM EDT by a video enabled telemedicine application and verified that I am speaking with the correct person using two identifiers.   I discussed the limitations of evaluation and management by telemedicine and the availability of in person appointments. The patient expressed understanding and agreed to proceed.     I discussed the assessment and treatment plan with the patient. The patient was provided an opportunity to ask questions and all were answered. The patient agreed with the plan and demonstrated an understanding of the instructions.   The patient was advised to call back or seek an in-person evaluation if the symptoms worsen or if the condition fails to improve as anticipated. [facility] MD OP Progress Note  10/21/2019 4:16 PM Kathryn Munoz  MRN:  [id number]  Chief Complaint:  Chief Complaint    Follow-up     HPI: Kathryn Munoz is a 44 year old African-American female, lives in [place], has a history of bipolar disorder, insomnia, OCD, insulin resistance, hypothyroidism was evaluated by telemedicine today.  Patient today reports she is coping with the loss of her mother who passed away on 13-Jun-2022.  She reports that her increased dosage of Lamictal has definitely helped her with her mood.  She denies any significant sadness or crying spells.  She reports sleep is okay on the Seroquel.  She reports she no longer takes the trazodone.  She reports she recently had vertigo and was evaluated and was started on Antivert.  She however reports that has since resolved and she feels better.  Patient denies any suicidality, homicidality or perceptual disturbances.  She reports work is going well.  Patient reports she is currently under the care of [facility] and has lost more than 20 pounds.  Patient denies any other concerns  today.  Visit Diagnosis:    ICD-10-CM   1. Bipolar disorder, in full remission, most recent episode depressed (HCC)  F31.76 QUEtiapine (SEROQUEL) 50 MG tablet    lamoTRIgine (LAMICTAL) 25 MG tablet  2. Insomnia due to mental condition  F51.05   3. 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  F42.9     Past Psychiatric History: I have reviewed past psychiatric history from my progress note on 11/08/2017.  Past trials of Celexa.  Past Medical History:  Past Medical History:  Diagnosis Date  . Abnormal mammogram    breast biopsy, [facility] 2014  . ADD (attention deficit disorder)   . Anxiety   . Bipolar 2 disorder (HCC)   . Constipation   . Depression   . Dermatitis   . Heavy menstrual bleeding   . History of gestational diabetes   . History of kidney stones 2014  . Hypothyroidism   . IFG (impaired fasting glucose)   . Insulin resistance   . Irregular periods   . Joint pain   . Multiple food allergies    Dates and nuts  . Neuropathy   . Obesity   . OCD (obsessive compulsive disorder)   . Prediabetes   . Vitamin D deficiency disease     Past Surgical History:  Procedure Laterality Date  . BREAST BIOPSY Left 2014   [place] bx/clip-neg  . INTRAUTERINE DEVICE INSERTION  03/11/2012    Family Psychiatric History: I have reviewed family psychiatric history from my progress note on 11/08/2017  Family History:  Family  History  Problem Relation Age of Onset  . Diabetes Mother   . Pancreatitis Mother   . Hypertension Mother   . Alcohol abuse Mother   . Schizophrenia Mother   . Depression Mother   . Heart disease Mother   . Stroke Mother   . Thyroid disease Mother   . Kidney disease Mother   . Anxiety disorder Mother   . Alcohol abuse Father   . Diabetes Sister        pre-diabetic  . Diabetes Brother   . Drug abuse Brother   . ADD / ADHD Son   . Eczema Son   . Breast cancer Cousin        pat cousin  . Anxiety disorder Other   . Ovarian cancer Neg Hx      Social History: Reviewed social history from my progress note on 11/08/2017 Social History   Socioeconomic History  . Marital status: Married    Spouse name: Saamiya Jeppsen  . Number of children: 2  . Years of education: Not on file  . Highest education level: Bachelor's degree (e.g., BA, AB, BS)  Occupational History  . Occupation: Charity fundraiser  Tobacco Use  . Smoking status: Never Smoker  . Smokeless tobacco: Never Used  Vaping Use  . Vaping Use: Never used  Substance and Sexual Activity  . Alcohol use: No  . Drug use: No  . Sexual activity: Yes    Partners: Male    Birth control/protection: Other-see comments    Comment: nuvaring  Other Topics Concern  . Not on file  Social History Narrative  . Not on file   Social Determinants of Health   Financial Resource Strain:   . Difficulty of Paying Living Expenses:   Food Insecurity:   . Worried About Programme researcher, broadcasting/film/video in the Last Year:   . Barista in the Last Year:   Transportation Needs:   . Freight forwarder (Medical):   Marland Kitchen Lack of Transportation (Non-Medical):   Physical Activity: Inactive  . Days of Exercise per Week: 0 days  . Minutes of Exercise per Session: 0 min  Stress:   . Feeling of Stress :   Social Connections:   . Frequency of Communication with Friends and Family:   . Frequency of Social Gatherings with Friends and Family:   . Attends Religious Services:   . Active Member of Clubs or Organizations:   . Attends Banker Meetings:   Marland Kitchen Marital Status:     Allergies:  Allergies  Allergen Reactions  . Date Seed Extract  [Zizyphus Jujuba] Anaphylaxis, Cough, Itching and Swelling  . Other Swelling    Metabolic Disorder Labs: Lab Results  Component Value Date   HGBA1C 5.2 10/14/2019   MPG 117 03/31/2019   MPG 91 05/09/2018   No results found for: PROLACTIN Lab Results  Component Value Date   CHOL 186 10/14/2019   TRIG 102 10/14/2019   HDL 55 10/14/2019   CHOLHDL 3.7  03/31/2019   VLDL 14 10/16/2016   LDLCALC 113 (H) 10/14/2019   LDLCALC 129 (H) 07/17/2019   Lab Results  Component Value Date   TSH 2.210 07/17/2019   TSH 8.40 (H) 03/31/2019    Therapeutic Level Labs: No results found for: LITHIUM No results found for: VALPROATE No components found for:  CBMZ  Current Medications: Current Outpatient Medications  Medication Sig Dispense Refill  . citalopram (CELEXA) 40 MG tablet Take 1 tablet (40 mg total) by  mouth daily. 90 tablet 1  . EPINEPHrine 0.3 mg/0.3 mL IJ SOAJ injection INJECT 0.3 MLS INTO THE MUSCLE ONCE FOR 1 DOSE. FOR LIFE-THREATENING ALLERGIC REACTION / ANAPHYLAXIS  1  . etonogestrel-ethinyl estradiol (NUVARING) 0.12-0.015 MG/24HR vaginal ring Place 1 each vaginally every 28 (twenty-eight) days. Insert vaginally and leave in place for 3 consecutive weeks, then remove for 1 week. 3 each 0  . lamoTRIgine (LAMICTAL) 150 MG tablet Take 1 tablet (150 mg total) by mouth daily. 90 tablet 1  . lamoTRIgine (LAMICTAL) 25 MG tablet Take 1 tablet (25 mg total) by mouth daily. To be combined with Lamictal 150 MG to make 175 mg 90 tablet 0  . levothyroxine (SYNTHROID) 75 MCG tablet Take 1 tablet (75 mcg total) by mouth daily before breakfast. 30 tablet 0  . meclizine (ANTIVERT) 12.5 MG tablet Take 12.5 mg by mouth 3 (three) times daily as needed for dizziness.    . metFORMIN (GLUCOPHAGE-XR) 500 MG 24 hr tablet TAKE 3 TABLETS (1,500 MG TOTAL) BY MOUTH DAILY. 270 tablet 3  . polyethylene glycol (MIRALAX / GLYCOLAX) packet Take 17 g by mouth as needed.     Marland Kitchen. QUEtiapine (SEROQUEL) 50 MG tablet Take 1 tablet (50 mg total) by mouth at bedtime. 90 tablet 0  . traZODone (DESYREL) 50 MG tablet TAKE 1 AND 1/2 TO 2 TABLETS (75-100 MG TOTAL) BY MOUTH AT BEDTIME AS NEEDED FOR SLEEP. FOR SLEEP 180 tablet 1  . Vitamin D, Ergocalciferol, (DRISDOL) 1.25 MG (50000 UNIT) CAPS capsule TAKE 1 CAPSULE (50,000 UNITS TOTAL) BY MOUTH EVERY 7 (SEVEN) DAYS. 4 capsule 0   No  current facility-administered medications for this visit.     Musculoskeletal: Strength & Muscle Tone: UTA Gait & Station: normal Patient leans: N/A  Psychiatric Specialty Exam: Review of Systems  Neurological: Positive for dizziness (Improving).  Psychiatric/Behavioral: Negative for agitation, behavioral problems, confusion, decreased concentration, dysphoric mood, hallucinations, self-injury, sleep disturbance and suicidal ideas. The patient is not nervous/anxious and is not hyperactive.   All other systems reviewed and are negative.   There were no vitals taken for this visit.There is no height or weight on file to calculate BMI.  General Appearance: Casual  Eye Contact:  Fair  Speech:  Clear and Coherent  Volume:  Normal  Mood:  Euthymic  Affect:  Congruent  Thought Process:  Goal Directed and Descriptions of Associations: Intact  Orientation:  Full (Time, Place, and Person)  Thought Content: Logical   Suicidal Thoughts:  No  Homicidal Thoughts:  No  Memory:  Immediate;   Fair Recent;   Fair Remote;   Fair  Judgement:  Fair  Insight:  Fair  Psychomotor Activity:  Normal  Concentration:  Concentration: Fair and Attention Span: Fair  Recall:  FiservFair  Fund of Knowledge: Fair  Language: Fair  Akathisia:  No  Handed:  Right  AIMS (if indicated): UTA  Assets:  Communication Skills Desire for Improvement Housing Social Support  ADL's:  Intact  Cognition: WNL  Sleep:  Fair   Screenings: PHQ2-9     [facility] [facility]  PHQ-2 Total Score 6 1 0 2 3  PHQ-9 Total Score 19 5 0 9 8       Assessment and Plan: Kathryn Munoz is  a 44 year old African-American female, married, employed, lives  in [place], has a history of bipolar disorder, sleep problems, OCD, hypothyroidism was evaluated by telemedicine today.  Patient is biologically predisposed given her history of mental health problems in her family as well as history of trauma.  Patient with recent psychosocial stressors of death of her mother, current pandemic.  Patient however is currently making progress and will continue to benefit from medications and psychotherapy sessions.  Plan as noted below.  Plan Bipolar disorder in remission Lamictal 175 mg p.o. daily Celexa 40 mg p.o. daily Seroquel 50 mg p.o. nightly  OCD-stable Continue CBT as needed Celexa 40 mg p.o. daily  Insomnia-stable Continue Seroquel 50 mg p.o. nightly She does have trazodone 50 mg p.o. nightly as needed available however she has not been using it. Discussed the adverse side effect of dizziness, orthostatic hypotension when combining medications like Seroquel and trazodone.  Follow-up in clinic in 2 months or sooner if needed.  I have spent atleast 20 minutes non face to face with patient today. More than 50 % of the time was spent for preparing to see the patient ( e.g., review of test, records ), ordering medications and test ,psychoeducation and supportive psychotherapy and care coordination,as well as documenting clinical information in electronic health record. This note was generated in part or whole with voice recognition software. Voice recognition is usually quite accurate but there are transcription errors that can and very often do occur. I apologize for any typographical errors that were not detected and corrected.     Jomarie Longs, MD 10/21/2019, 4:16 PM

## 2019-10-22 NOTE — Progress Notes (Signed)
  Office: [phone]  /  Fax: [phone]    Date: November 05, 2019   Appointment Start Time: 2:05pm Duration: 20 minutes Provider: Lawerance Cruel, Psy.D. Type of Session: Individual Therapy  Location of Patient: Parked in car at work Location of Provider: Provider's Home Type of Contact: Telepsychological Visit via Telephone call  Session Content: This provider called Kathryn Munoz at 2:03pm as she did not present for the telepsychological appointment. She stated she attempted to login. Assistance was provided. Due to continued issues, Kathryn Munoz provided verbal consent to continue with today's appointment via a regular telephone call. As such, today's appointment was initiated 5 minutes late. Kathryn Munoz is a 44 y.o. female presenting via a telephone call for a follow-up appointment to address the previously established treatment goal of increasing coping skills. Today's appointment was a telepsychological visit due to COVID-19. Kathryn Munoz provided verbal consent for today's telepsychological appointment and she is aware she is responsible for securing confidentiality on her end of the session. Prior to proceeding with today's appointment, Kathryn Munoz's physical location at the time of this appointment was obtained as well a phone number she could be reached at in the event of technical difficulties. Kathryn Munoz and this provider participated in today's telepsychological service.   This provider conducted a brief check-in. Kathryn Munoz shared, "The eating is still pretty good." She acknowledged deviations while on vacation; however, discussed making better choices and engaging in portion control. She also discussed a plan to join [employer] to increase physical activity. Session focused further on mindfulness to assist with coping. She discussed meditating while on vacation and noted utilizing previously shared exercises. Positive reinforcement was provided. Kathryn Munoz was led through a breathing mindfulness exercise and her  experience was processed. Kathryn Munoz provided verbal consent during today's appointment for this provider to send the handout for today's exercise via e-mail. Kathryn Munoz was receptive to today's appointment as evidenced by openness to sharing, responsiveness to feedback, and willingness to continue engaging in mindfulness exercises.  Mental Status Examination:  Appearance: unable to assess Behavior: unable to assess Mood: euthymic Affect: unable to fully assess Speech: normal in rate, volume, and tone Eye Contact: unable to assess Psychomotor Activity: unable to assess Gait: unable to assess Thought Process: linear, logical, and goal directed  Thought Content/Perception: denies suicidal and homicidal ideation, plan, and intent and no hallucinations, delusions, bizarre thinking or behavior reported or observed Orientation: time, person, place, and purpose of appointment Memory/Concentration: memory, attention, language, and fund of knowledge intact  Insight/Judgment: good  Interventions:  Conducted a brief chart review Provided empathic reflections and validation Reviewed content from the previous session Provided positive reinforcement Employed supportive psychotherapy interventions to facilitate reduced distress and to improve coping skills with identified stressors Engaged patient in mindfulness exercise(s) Employed acceptance and commitment interventions to emphasize mindfulness and acceptance without struggle  DSM-5 Diagnosis(es): 307.59 (F50.8) Other Specified Feeding or Eating Disorder, Emotional Eating Behaviors and 296.80 (F31.9) Unspecified Bipolar and Related Disorder  Treatment Goal & Progress: During the initial appointment with this provider, the following treatment goal was established: increase coping skills. Kathryn Munoz has demonstrated progress in her goal as evidenced by increased awareness of hunger patterns and increased awareness of triggers for emotional eating. Kathryn Munoz also  continues to demonstrate willingness to engage in learned skill(s).  Plan: The next appointment will be scheduled in one month, which will be via MyChart Video Visit. The next session will focus on working towards the established treatment goal and termination.

## 2019-10-27 ENCOUNTER — Other Ambulatory Visit: Payer: Self-pay | Admitting: Obstetrics and Gynecology

## 2019-10-27 DIAGNOSIS — Z3009 Encounter for other general counseling and advice on contraception: Secondary | ICD-10-CM

## 2019-10-30 ENCOUNTER — Other Ambulatory Visit (INDEPENDENT_AMBULATORY_CARE_PROVIDER_SITE_OTHER): Payer: Self-pay | Admitting: Adult Health

## 2019-10-30 DIAGNOSIS — E038 Other specified hypothyroidism: Secondary | ICD-10-CM

## 2019-11-05 ENCOUNTER — Other Ambulatory Visit: Payer: Self-pay

## 2019-11-05 ENCOUNTER — Telehealth (INDEPENDENT_AMBULATORY_CARE_PROVIDER_SITE_OTHER): Payer: BC Managed Care – PPO | Admitting: Psychology

## 2019-11-05 DIAGNOSIS — F319 Bipolar disorder, unspecified: Secondary | ICD-10-CM

## 2019-11-05 DIAGNOSIS — F5089 Other specified eating disorder: Secondary | ICD-10-CM | POA: Diagnosis not present

## 2019-11-06 ENCOUNTER — Ambulatory Visit (INDEPENDENT_AMBULATORY_CARE_PROVIDER_SITE_OTHER): Payer: BC Managed Care – PPO | Admitting: Bariatrics

## 2019-11-06 ENCOUNTER — Other Ambulatory Visit: Payer: Self-pay

## 2019-11-06 ENCOUNTER — Encounter (INDEPENDENT_AMBULATORY_CARE_PROVIDER_SITE_OTHER): Payer: Self-pay | Admitting: Bariatrics

## 2019-11-06 VITALS — BP 119/80 | HR 83 | Temp 98.1°F | Ht 65.0 in | Wt 228.0 lb

## 2019-11-06 DIAGNOSIS — Z9189 Other specified personal risk factors, not elsewhere classified: Secondary | ICD-10-CM

## 2019-11-06 DIAGNOSIS — E8881 Metabolic syndrome: Secondary | ICD-10-CM | POA: Diagnosis not present

## 2019-11-06 DIAGNOSIS — F3289 Other specified depressive episodes: Secondary | ICD-10-CM

## 2019-11-06 DIAGNOSIS — Z6838 Body mass index (BMI) 38.0-38.9, adult: Secondary | ICD-10-CM

## 2019-11-10 NOTE — Progress Notes (Signed)
Chief Complaint:   OBESITY Kathryn Munoz is here to discuss her progress with her obesity treatment plan along with follow-up of her obesity related diagnoses. Kathryn Munoz is on the Category 3 Plan and states she is following her eating plan approximately 9% of the time. Hanadi states she is doing sitting exercises 15 minutes 3 times per week.  Today's visit was #: 7 Starting weight: 252 lbs Starting date: 07/17/2019 Today's weight: 228 lbs Today's date: 11/06/2019 Total lbs lost to date: 24 Total lbs lost since last in-office visit: 2  Interim History: Kathryn Munoz has lost 2 lbs despite being on vacation. She has been making better choices.  Subjective:   Insulin resistance. Kathryn Munoz has a diagnosis of insulin resistance based on her elevated fasting insulin level >5. She continues to work on diet and exercise to decrease her risk of diabetes. No polyphagia.  Lab Results  Component Value Date   INSULIN 32.5 (H) 10/14/2019   INSULIN 19.6 07/17/2019   Lab Results  Component Value Date   HGBA1C 5.2 10/14/2019   Other depression, with emotional eating. Kathryn Munoz is struggling with emotional eating and using food for comfort to the extent that it is negatively impacting her health. She has been working on behavior modification techniques to help reduce her emotional eating and has been somewhat successful. She shows no sign of suicidal or homicidal ideations. Kathryn Munoz has seen Dr. Dewaine Conger.  At risk for diabetes mellitus. Kathryn Munoz is at higher than average risk for developing diabetes due to insulin resistance.  Assessment/Plan:   Insulin resistance. Kathryn Munoz will continue to work on weight loss, increasing activities, and decreasing carbohydrates ( sweets and refined carbohydrates) to help decrease the risk of diabetes. Kathryn Munoz agreed to follow-up with [place] as directed to closely monitor her progress.  Other depression, with emotional eating. Behavior modification techniques were discussed  today to help Kathryn Munoz deal with her emotional/non-hunger eating behaviors.  Orders and follow up as documented in patient record. Kathryn Munoz will continue follow-up with Dr. Dewaine Conger as scheduled and will follow CBT techniques.  At risk for diabetes mellitus. Kathryn Munoz was given approximately 15 minutes of diabetes education and counseling today. We discussed intensive lifestyle modifications today with an emphasis on weight loss as well as increasing exercise and decreasing simple carbohydrates in her diet. We also reviewed medication options with an emphasis on risk versus benefit of those discussed.   Repetitive spaced learning was employed today to elicit superior memory formation and behavioral change.  Class 2 severe obesity with serious comorbidity and body mass index (BMI) of 38.0 to 38.9 in adult, unspecified obesity type (HCC).  Kathryn Munoz is currently in the action stage of change. As such, her goal is to continue with weight loss efforts. She has agreed to the Category 3 Plan.   She will work on meal planning, intentional eating, and increasing her water intake.   We reviewed with the patient labs from 10/14/2019 including CMP, lipids, Vitamin D, A1c, and insulin.  Exercise goals: Kathryn Munoz will continue chair exercises.  Behavioral modification strategies: increasing lean protein intake, decreasing simple carbohydrates, increasing vegetables, increasing water intake, decreasing eating out, no skipping meals, meal planning and cooking strategies, keeping healthy foods in the home and planning for success.  Kathryn Munoz has agreed to follow-up with our clinic in 2 weeks. She was informed of the importance of frequent follow-up visits to maximize her success with intensive lifestyle modifications for her multiple health conditions.   Objective:   Blood pressure 119/80,  pulse 83, temperature 98.1 F (36.7 C), height 5\' 5"  (1.651 m), weight 228 lb (103.4 kg), SpO2 96 %. Body mass index is 37.94  kg/m.  General: Cooperative, alert, well developed, in no acute distress. HEENT: Conjunctivae and lids unremarkable. Cardiovascular: Regular rhythm.  Lungs: Normal work of breathing. Neurologic: No focal deficits.   Lab Results  Component Value Date   CREATININE 0.97 10/14/2019   BUN 9 10/14/2019   NA 139 10/14/2019   K 4.6 10/14/2019   CL 104 10/14/2019   CO2 22 10/14/2019   Lab Results  Component Value Date   ALT 13 10/14/2019   AST 16 10/14/2019   ALKPHOS 65 10/14/2019   BILITOT 0.4 10/14/2019   Lab Results  Component Value Date   HGBA1C 5.2 10/14/2019   HGBA1C 5.5 07/17/2019   HGBA1C 5.7 (H) 03/31/2019   HGBA1C 4.8 05/09/2018   HGBA1C 5.5 10/22/2017   Lab Results  Component Value Date   INSULIN 32.5 (H) 10/14/2019   INSULIN 19.6 07/17/2019   Lab Results  Component Value Date   TSH 2.210 07/17/2019   Lab Results  Component Value Date   CHOL 186 10/14/2019   HDL 55 10/14/2019   LDLCALC 113 (H) 10/14/2019   TRIG 102 10/14/2019   CHOLHDL 3.7 03/31/2019   Lab Results  Component Value Date   WBC 8.0 07/17/2019   HGB 12.6 07/17/2019   HCT 38.6 07/17/2019   MCV 98 (H) 07/17/2019   PLT 319 07/17/2019   No results found for: IRON, TIBC, FERRITIN  Attestation Statements:   Reviewed by clinician on day of visit: allergies, medications, problem list, medical history, surgical history, family history, social history, and previous encounter notes.  07/19/2019, am acting as Fernanda Drum for Energy manager, DO   I have reviewed the above documentation for accuracy and completeness, and I agree with the above. [employer], DO

## 2019-11-11 ENCOUNTER — Other Ambulatory Visit: Payer: Self-pay

## 2019-11-11 ENCOUNTER — Ambulatory Visit (INDEPENDENT_AMBULATORY_CARE_PROVIDER_SITE_OTHER): Payer: BC Managed Care – PPO | Admitting: Family Medicine

## 2019-11-11 ENCOUNTER — Encounter: Payer: Self-pay | Admitting: Family Medicine

## 2019-11-11 VITALS — BP 120/74 | HR 85 | Temp 98.8°F | Resp 16 | Ht 65.0 in | Wt 232.9 lb

## 2019-11-11 DIAGNOSIS — Z0000 Encounter for general adult medical examination without abnormal findings: Secondary | ICD-10-CM

## 2019-11-11 DIAGNOSIS — Z6838 Body mass index (BMI) 38.0-38.9, adult: Secondary | ICD-10-CM

## 2019-11-11 DIAGNOSIS — Z1231 Encounter for screening mammogram for malignant neoplasm of breast: Secondary | ICD-10-CM

## 2019-11-11 DIAGNOSIS — L509 Urticaria, unspecified: Secondary | ICD-10-CM

## 2019-11-11 DIAGNOSIS — F3181 Bipolar II disorder: Secondary | ICD-10-CM

## 2019-11-11 DIAGNOSIS — F331 Major depressive disorder, recurrent, moderate: Secondary | ICD-10-CM

## 2019-11-11 DIAGNOSIS — T63481A Toxic effect of venom of other arthropod, accidental (unintentional), initial encounter: Secondary | ICD-10-CM | POA: Diagnosis not present

## 2019-11-11 DIAGNOSIS — E559 Vitamin D deficiency, unspecified: Secondary | ICD-10-CM

## 2019-11-11 DIAGNOSIS — F5105 Insomnia due to other mental disorder: Secondary | ICD-10-CM

## 2019-11-11 DIAGNOSIS — E039 Hypothyroidism, unspecified: Secondary | ICD-10-CM

## 2019-11-11 DIAGNOSIS — E8881 Metabolic syndrome: Secondary | ICD-10-CM

## 2019-11-11 MED ORDER — DEXAMETHASONE SODIUM PHOSPHATE 10 MG/ML IJ SOLN
10.0000 mg | Freq: Once | INTRAMUSCULAR | Status: AC
  Administered 2019-11-11: 10 mg via INTRAMUSCULAR

## 2019-11-11 MED ORDER — DIPHENHYDRAMINE HCL 50 MG/ML IJ SOLN
50.0000 mg | Freq: Once | INTRAMUSCULAR | Status: AC
  Administered 2019-11-11: 50 mg via INTRAMUSCULAR

## 2019-11-11 NOTE — Patient Instructions (Addendum)
Benadryl 25-50 mg by mouth every 4-6 hours as needed for allergic reaction, itching, hives 300 mg max in 24 hours   Add pepcid 20 mg twice a day as needed to help the antihistamine be stronger    [facility] [place],  [place]  [id number] Get Driving Directions Main: [phone]  Health Maintenance  Topic Date Due  . Flu Shot  10/19/2019  . Mammogram  11/26/2019  . Pap Smear  10/22/2020  . Tetanus Vaccine  10/10/2025  . COVID-19 Vaccine  Completed  .  Hepatitis C: One time screening is recommended by Center for Disease Control  (CDC) for  adults born from [birth year] through 1963.   Completed  . HIV Screening  Completed      Anaphylactic Reaction, Adult An anaphylactic reaction (anaphylaxis) is a sudden, severe allergic reaction by the body's disease-fighting system (immune system). Anaphylaxis can be life-threatening. This condition must be treated right away. Sometimes a person may need to be treated in the hospital. What are the causes? This condition is caused by exposure to a substance that you are allergic to (allergen). In response to this exposure, the body releases proteins (antibodies) and other compounds, such as histamine, into the bloodstream. This causes swelling in certain tissues and loss of blood pressure to important areas, such as the heart and lungs. Common allergens that can cause anaphylaxis include:  Foods, especially peanuts, wheat, shellfish, milk, and eggs.  Medicines.  Insect bites or stings.  Blood or parts of blood received for treatment (transfusions).  Chemicals, such as dyes, latex, and contrast material that is used for medical tests. What increases the risk? This condition is more likely to occur in people who:  Have allergies.  Have had anaphylaxis before.  Have a family history of anaphylaxis.  Have certain medical conditions, including asthma and eczema. What are the signs or  symptoms? Symptoms of anaphylaxis may include:  Feeling warm in the face (flushed). This may include redness.  Itchy, red, swollen areas of skin (hives).  Swelling of the eyes, lips, face, mouth, tongue, or throat.  Difficulty breathing, speaking, or swallowing.  Noisy breathing (wheezing).  Dizziness or light-headedness.  Fainting.  Pain or cramping in the abdomen.  Vomiting.  Diarrhea. How is this diagnosed? This condition is diagnosed based on:  Your symptoms.  A physical exam.  Blood tests.  Recent history of exposure to allergens. How is this treated? If you think you are having an anaphylactic reaction, you should do the following right away:  Give yourself an epinephrine injection using what is commonly called an auto-injector "pen" (pre-filled automatic epinephrine injection device). Your health care provider will teach you how to use an auto-injector pen.  Call for emergency help. If you use a pen, you must still get emergency medical treatment in the hospital. Treatment in the hospital may include: ? Medicines to help:  Tighten your blood vessels (epinephrine).  Relieve itching and hives (antihistamines).  Reduce swelling (corticosteroids). ? Oxygen therapy to help you breathe. ? IV fluids to keep you hydrated. Follow these instructions at home: Safety  Always keep an auto-injector pen near you. This can be lifesaving if you have a severe anaphylactic reaction. Use your auto-injector pen as told by your health care provider.  Do not drive after an anaphylactic reaction until your health care provider approves.  Make sure that you, the members of your household, and your employer know: ? What you are allergic  to, so it can be avoided. ? How to use an auto-injector pen to give you an epinephrine injection.  Replace your epinephrine immediately after you use your auto-injector pen. This is important if you have another reaction.  If told by your  health care provider, wear a medical alert bracelet or necklace that states your allergy.  Learn the signs of anaphylaxis so that you can recognize and treat it right away.  Work with your health care providers to make an anaphylaxis plan. Preparation is important. General instructions  If you have hives or rash: ? Use an over-the-counter antihistamine as told by your health care provider. ? Apply cold, wet cloths (cold compresses) to your skin or take baths or showers in cool water. Avoid hot water.  Take over-the-counter and prescription medicines only as told by your health care provider.  Tell all your health care providers that you have an allergy.  Keep all follow-up visits as told by your health care provider. This is important. How is this prevented?  Avoid allergens that have caused an anaphylactic reaction in the past.  When you are at a restaurant, tell your server that you have an allergy. If you are not sure whether a menu item contains an ingredient that you are allergic to, ask your server. Where to find more information  American Academy of Allergy, Asthma and Immunology: aaaai.org  American Academy of Pediatrics: healthychildren.org Get help right away if:  You develop symptoms of an allergic reaction. You may notice them soon after you are exposed to a substance. Symptoms may include: ? Flushed skin. ? Hives. ? Swelling of the eyes, lips, face, mouth, tongue, or throat. ? Difficulty breathing, speaking, or swallowing. ? Wheezing. ? Dizziness or light-headedness. ? Fainting. ? Pain or cramping in the abdomen. ? Vomiting. ? Diarrhea.  You used epinephrine. You need more medical care even if the medicine seems to be working. This is important because anaphylaxis may happen again within 72 hours (rebound anaphylaxis). You may need more doses of epinephrine. These symptoms may represent a serious problem that is an emergency. Do not wait to see if the symptoms  will go away. Do the following right away:  Use the auto-injector pen as you have been instructed.  Get medical help. Call your local emergency services (911 in the [place] Do not drive yourself to the hospital. Summary  An anaphylactic reaction (anaphylaxis) is a sudden, severe allergic reaction by the body's disease-fighting system (immune system).  This condition can be life-threatening. If you have an anaphylactic reaction, get medical help right away.  Your health care provider may teach you how to use an auto-injector "pen" (pre-filled automatic epinephrine injection device) to give yourself a shot.  Always keep an auto-injector pen with you. This could save your life. Use it as told by your health care provider.  If you use epinephrine, you must still get emergency medical treatment, even if the medicine seems to be working. This information is not intended to replace advice given to you by your health care provider. Make sure you discuss any questions you have with your health care provider. Document Revised: 06/28/2017 Document Reviewed: 06/28/2017 Elsevier Patient Education  2020 Elsevier Inc.    Preventive Care 28-45 Years Old, Female Preventive care refers to visits with your health care provider and lifestyle choices that can promote health and wellness. This includes:  A yearly physical exam. This may also be called an annual well check.  Regular dental visits and  eye exams.  Immunizations.  Screening for certain conditions.  Healthy lifestyle choices, such as eating a healthy diet, getting regular exercise, not using drugs or products that contain nicotine and tobacco, and limiting alcohol use. What can I expect for my preventive care visit? Physical exam Your health care provider will check your:  Height and weight. This may be used to calculate body mass index (BMI), which tells if you are at a healthy weight.  Heart rate and blood pressure.  Skin for  abnormal spots. Counseling Your health care provider may ask you questions about your:  Alcohol, tobacco, and drug use.  Emotional well-being.  Home and relationship well-being.  Sexual activity.  Eating habits.  Work and work Statistician.  Method of birth control.  Menstrual cycle.  Pregnancy history. What immunizations do I need?  Influenza (flu) vaccine  This is recommended every year. Tetanus, diphtheria, and pertussis (Tdap) vaccine  You may need a Td booster every 10 years. Varicella (chickenpox) vaccine  You may need this if you have not been vaccinated. Zoster (shingles) vaccine  You may need this after age 17. Measles, mumps, and rubella (MMR) vaccine  You may need at least one dose of MMR if you were born in 1957 or later. You may also need a second dose. Pneumococcal conjugate (PCV13) vaccine  You may need this if you have certain conditions and were not previously vaccinated. Pneumococcal polysaccharide (PPSV23) vaccine  You may need one or two doses if you smoke cigarettes or if you have certain conditions. Meningococcal conjugate (MenACWY) vaccine  You may need this if you have certain conditions. Hepatitis A vaccine  You may need this if you have certain conditions or if you travel or work in places where you may be exposed to hepatitis A. Hepatitis B vaccine  You may need this if you have certain conditions or if you travel or work in places where you may be exposed to hepatitis B. Haemophilus influenzae type b (Hib) vaccine  You may need this if you have certain conditions. Human papillomavirus (HPV) vaccine  If recommended by your health care provider, you may need three doses over 6 months. You may receive vaccines as individual doses or as more than one vaccine together in one shot (combination vaccines). Talk with your health care provider about the risks and benefits of combination vaccines. What tests do I need? Blood tests  Lipid  and cholesterol levels. These may be checked every 5 years, or more frequently if you are over 63 years old.  Hepatitis C test.  Hepatitis B test. Screening  Lung cancer screening. You may have this screening every year starting at age 34 if you have a 30-pack-year history of smoking and currently smoke or have quit within the past 15 years.  Colorectal cancer screening. All adults should have this screening starting at age 63 and continuing until age 4. Your health care provider may recommend screening at age 30 if you are at increased risk. You will have tests every 1-10 years, depending on your results and the type of screening test.  Diabetes screening. This is done by checking your blood sugar (glucose) after you have not eaten for a while (fasting). You may have this done every 1-3 years.  Mammogram. This may be done every 1-2 years. Talk with your health care provider about when you should start having regular mammograms. This may depend on whether you have a family history of breast cancer.  BRCA-related cancer screening. This  may be done if you have a family history of breast, ovarian, tubal, or peritoneal cancers.  Pelvic exam and Pap test. This may be done every 3 years starting at age 41. Starting at age 93, this may be done every 5 years if you have a Pap test in combination with an HPV test. Other tests  Sexually transmitted disease (STD) testing.  Bone density scan. This is done to screen for osteoporosis. You may have this scan if you are at high risk for osteoporosis. Follow these instructions at home: Eating and drinking  Eat a diet that includes fresh fruits and vegetables, whole grains, lean protein, and low-fat dairy.  Take vitamin and mineral supplements as recommended by your health care provider.  Do not drink alcohol if: ? Your health care provider tells you not to drink. ? You are pregnant, may be pregnant, or are planning to become pregnant.  If you drink  alcohol: ? Limit how much you have to 0-1 drink a day. ? Be aware of how much alcohol is in your drink. In the [place] one drink equals one 12 oz bottle of beer (355 mL), one 5 oz glass of wine (148 mL), or one 1 oz glass of hard liquor (44 mL). Lifestyle  Take daily care of your teeth and gums.  Stay active. Exercise for at least 30 minutes on 5 or more days each week.  Do not use any products that contain nicotine or tobacco, such as cigarettes, e-cigarettes, and chewing tobacco. If you need help quitting, ask your health care provider.  If you are sexually active, practice safe sex. Use a condom or other form of birth control (contraception) in order to prevent pregnancy and STIs (sexually transmitted infections).  If told by your health care provider, take low-dose aspirin daily starting at age 54. What's next?  Visit your health care provider once a year for a well check visit.  Ask your health care provider how often you should have your eyes and teeth checked.  Stay up to date on all vaccines. This information is not intended to replace advice given to you by your health care provider. Make sure you discuss any questions you have with your health care provider. Document Revised: 11/15/2017 Document Reviewed: 11/15/2017 Elsevier Patient Education  2020 [employer].

## 2019-11-11 NOTE — Progress Notes (Signed)
Patient: Kathryn Munoz, Female    DOB: [birth year], 44 y.o.   MRN: [id number] Delsa Grana, PA-C Visit Date: 11/11/2019  Today's Provider: Delsa Grana, PA-C   Chief Complaint  Patient presents with  . Annual Exam   Subjective:   Annual physical exam:  Kathryn Munoz is a 44 y.o. female who presents today for complete physical exam:  Exercise/Activity:  walking 3 times a week   Diet/nutrition:  working with [facility] wellness program, has lost 25 lbs in 4 months. Eating well balance diet  Sleep:  8 to 10 hours a sleep   Pt wished to discuss acute complaints bee sting while walking into door for appointment  Advised pt of separate visit billing/coding    Pt has been seeing Dr. Leafy Ro for medical weight management - changed diet and she has a meal plan, taking metformin 1500 mg once a day, she's lost 20+ lbs, feeling much better  Psychiatry has adjusted her meds - her mom diet in March, with  meds adjusted she feels much better than last appt.  She is talking to psych and a therapist  Pt got stung by something when walking into clinic today, she has hives developing to her right night and itching rapidly worsening to   USPSTF grade A and B recommendations - reviewed and addressed today  Depression:  Phq 9 completed today by patient, was reviewed by me with patient in the room PHQ score is 1, pt feels great- much better PHQ 2/9 Scores 11/11/2019 07/17/2019 03/24/2019 11/08/2018  PHQ - 2 Score 0 6 1 0  PHQ- 9 Score 1 19 5  0   Depression screen [facility] 2/9 11/11/2019 07/17/2019 03/24/2019 11/08/2018 08/26/2018  Decreased Interest 0 3 - 0 1  Down, Depressed, Hopeless 0 3 1 0 1  PHQ - 2 Score 0 6 1 0 2  Altered sleeping 0 3 1 0 1  Tired, decreased energy 0 3 1 0 0  Change in appetite 0 2 1 0 2  Feeling bad or failure about yourself  0 1 0 0 1  Trouble concentrating 1 1 0 0 2  Moving slowly or fidgety/restless 0 2 1 0 1  Suicidal thoughts 0 1 0 0 0  PHQ-9 Score 1 19 5  0 9   Difficult doing work/chores Not difficult at all Very difficult Somewhat difficult Not difficult at all -  Some recent data might be hidden   Alcohol screening:   Office Visit from 11/11/2019 in [facility]  AUDIT-C Score 0     Immunizations and Health Maintenance: Health Maintenance  Topic Date Due  . INFLUENZA VACCINE  10/19/2019  . MAMMOGRAM  11/26/2019  . PAP SMEAR-Modifier  10/22/2020  . TETANUS/TDAP  10/10/2025  . COVID-19 Vaccine  Completed  . Hepatitis C Screening  Completed  . HIV Screening  Completed     Hep C Screening: Completed  STD testing and prevention (HIV/chl/gon/syphilis):  see above, no additional testing desired by pt today  Intimate partner violence: safe  Sexual History/Pain during Intercourse: Married - no concerns  Menstrual History/LMP/Abnormal Bleeding: irregular - seeing OBGYN for it Patient's last menstrual period was 09/11/2019.  Incontinence Symptoms: none  Breast cancer:   Last Mammogram: see HM list above BRCA gene screening:   Cervical cancer screening: completed, next due on 10/22/2020 Pt denies immediate family hx of cancers - breast, ovarian, uterine, colon:    Distant cousin with breast CA  Osteoporosis:  Discussion on osteoporosis per age, including high calcium and vitamin D supplementation, weight bearing exercises N/a today due to age  Skin cancer:  Hx of skin CA -  NO Discussed atypical lesions   Colorectal cancer:   Colonoscopy is due at 65 Discussed concerning signs and sx of CRC, pt denies melena, hematochezia, no BM change, chronic constipation  Lung cancer:   Low Dose CT Chest recommended if Age 43-80 years, 30 pack-year currently smoking OR have quit w/in 15years. Patient does not qualify.    Social History   Tobacco Use  . Smoking status: Never Smoker  . Smokeless tobacco: Never Used  Vaping Use  . Vaping Use: Never used  Substance Use Topics  . Alcohol use: No  . Drug use: No        [facility] Visit from 11/11/[facility] in [facility]  AUDIT-C Score 0      Family History  Problem Relation Age of Onset  . Diabetes Mother   . Pancreatitis Mother   . Hypertension Mother   . Alcohol abuse Mother   . Schizophrenia Mother   . Depression Mother   . Heart disease Mother   . Stroke Mother   . Thyroid disease Mother   . Kidney disease Mother   . Anxiety disorder Mother   . Alcohol abuse Father   . Diabetes Sister        pre-diabetic  . Diabetes Brother   . Drug abuse Brother   . ADD / ADHD Son   . Eczema Son   . Breast cancer Cousin        pat cousin  . Anxiety disorder Other   . Ovarian cancer Neg Hx      Blood pressure/Hypertension: BP Readings from Last 3 Encounters:  11/11/19 120/74  11/06/19 119/80  10/14/19 111/74    Weight/Obesity: Wt Readings from Last 3 Encounters:  11/11/19 232 lb 14.4 oz (105.6 kg)  11/06/19 228 lb (103.4 kg)  10/14/19 (!) 230 lb (104.3 kg)   BMI Readings from Last 3 Encounters:  11/11/19 38.76 kg/m  11/06/19 37.94 kg/m  10/14/19 38.27 kg/m     Lipids:  Lab Results  Component Value Date   CHOL 186 10/14/2019   CHOL 193 07/17/2019   CHOL 186 03/31/2019   Lab Results  Component Value Date   HDL 55 10/14/2019   HDL 39 (L) 07/17/2019   HDL 50 03/31/2019   Lab Results  Component Value Date   LDLCALC 113 (H) 10/14/2019   LDLCALC 129 (H) 07/17/2019   LDLCALC 110 (H) 03/31/2019   Lab Results  Component Value Date   TRIG 102 10/14/2019   TRIG 136 07/17/2019   TRIG 148 03/31/2019   Lab Results  Component Value Date   CHOLHDL 3.7 03/31/2019   CHOLHDL 3.1 11/08/2018   CHOLHDL 2.3 10/22/2017   No results found for: LDLDIRECT Based on the results of lipid panel his/her cardiovascular risk factor ( using [facility] )  in the next 10 years is: The 10-year ASCVD risk score Mikey Bussing DC Brooke Bonito., et al., 2013) is: 0.5%   Values used to calculate the score:     Age: 72 years     Sex: Female     Is  Non-Hispanic African American: Yes     Diabetic: No     Tobacco smoker: No     Systolic Blood Pressure: 671 mmHg     Is BP treated: No     HDL Cholesterol: 55 mg/dL  Total Cholesterol: 186 mg/dL  Glucose:  Glucose  Date Value Ref Range Status  10/14/2019 94 65 - 99 mg/dL Final  07/17/2019 122 (H) 65 - 99 mg/dL Final   Glucose, Bld  Date Value Ref Range Status  03/31/2019 159 (H) 65 - 139 mg/dL Final    Comment:    .        Non-fasting reference interval .   11/08/2018 85 65 - 99 mg/dL Final    Comment:    .            Fasting reference interval .   05/09/2018 111 (H) 65 - 99 mg/dL Final    Comment:    .            Fasting reference interval . For someone without known diabetes, a glucose value between 100 and 125 mg/dL is consistent with prediabetes and should be confirmed with a follow-up test. .    Hypertension: BP Readings from Last 3 Encounters:  11/11/19 120/74  11/06/19 119/80  10/14/19 111/74   Obesity: Wt Readings from Last 3 Encounters:  11/11/19 232 lb 14.4 oz (105.6 kg)  11/06/19 228 lb (103.4 kg)  10/14/19 (!) 230 lb (104.3 kg)   BMI Readings from Last 3 Encounters:  11/11/19 38.76 kg/m  11/06/19 37.94 kg/m  10/14/19 38.27 kg/m      Advanced Care Planning:  A voluntary discussion about advance care planning including the explanation and discussion of advance directives.   Discussed health care proxy and Living will, and the patient was able to identify a health care proxy as Shahla Betsill.   Patient does not have a living will at present time.   Social History      She        Social History   Socioeconomic History  . Marital status: Married    Spouse name: Harini Dearmond  . Number of children: 2  . Years of education: Not on file  . Highest education level: Bachelor's degree (e.g., BA, AB, BS)  Occupational History  . Occupation: English as a second language teacher  Tobacco Use  . Smoking status: Never Smoker  . Smokeless tobacco: Never Used   Vaping Use  . Vaping Use: Never used  Substance and Sexual Activity  . Alcohol use: No  . Drug use: No  . Sexual activity: Yes    Partners: Male    Birth control/protection: Other-see comments    Comment: nuvaring  Other Topics Concern  . Not on file  Social History Narrative  . Not on file   Social Determinants of Health   Financial Resource Strain:   . Difficulty of Paying Living Expenses: Not on file  Food Insecurity:   . Worried About Charity fundraiser in the Last Year: Not on file  . Ran Out of Food in the Last Year: Not on file  Transportation Needs:   . Lack of Transportation (Medical): Not on file  . Lack of Transportation (Non-Medical): Not on file  Physical Activity:   . Days of Exercise per Week: Not on file  . Minutes of Exercise per Session: Not on file  Stress:   . Feeling of Stress : Not on file  Social Connections:   . Frequency of Communication with Friends and Family: Not on file  . Frequency of Social Gatherings with Friends and Family: Not on file  . Attends Religious Services: Not on file  . Active Member of Clubs or Organizations: Not on file  . Attends Club or  Organization Meetings: Not on file  . Marital Status: Not on file    Family History        Family History  Problem Relation Age of Onset  . Diabetes Mother   . Pancreatitis Mother   . Hypertension Mother   . Alcohol abuse Mother   . Schizophrenia Mother   . Depression Mother   . Heart disease Mother   . Stroke Mother   . Thyroid disease Mother   . Kidney disease Mother   . Anxiety disorder Mother   . Alcohol abuse Father   . Diabetes Sister        pre-diabetic  . Diabetes Brother   . Drug abuse Brother   . ADD / ADHD Son   . Eczema Son   . Breast cancer Cousin        pat cousin  . Anxiety disorder Other   . Ovarian cancer Neg Hx     Patient Active Problem List   Diagnosis Date Noted  . Heavy menstrual period 06/28/2019  . Obsessive-compulsive disorder with good or  fair insight 01/01/2019  . Bipolar 2 disorder, major depressive episode ([place]) 09/05/2018  . Insomnia due to mental condition 09/05/2018  . Class 3 severe obesity with serious comorbidity and body mass index (BMI) of 40.0 to 44.9 in adult ([place]) 05/09/2018  . Metabolic syndrome 62/37/6283  . Acanthosis nigricans 10/22/2017  . Moderate episode of recurrent major depressive disorder ([place]) 10/22/2017  . Acid reflux 04/01/2017  . Anxiety 06/01/2016  . Morbid obesity with BMI of 40.0-44.9, adult ([place]) 03/12/2015  . Peripheral neuropathy 10/09/2014  . Hypothyroidism   . IFG (impaired fasting glucose)   . Vitamin D deficiency   . Neuropathy     Past Surgical History:  Procedure Laterality Date  . BREAST BIOPSY Left 2014   [place] bx/clip-neg  . INTRAUTERINE DEVICE INSERTION  03/11/2012     Current Outpatient Medications:  .  citalopram (CELEXA) 40 MG tablet, Take 1 tablet (40 mg total) by mouth daily., Disp: 90 tablet, Rfl: 1 .  EPINEPHrine 0.3 mg/0.3 mL IJ SOAJ injection, INJECT 0.3 MLS INTO THE MUSCLE ONCE FOR 1 DOSE. FOR LIFE-THREATENING ALLERGIC REACTION / ANAPHYLAXIS, Disp: , Rfl: 1 .  etonogestrel-ethinyl estradiol (NUVARING) 0.12-0.015 MG/24HR vaginal ring, INSERT 1 RING VAGINALLY AS DIRECTED. REMOVE AFTER 3 WEEKS & WAIT 7 DAYS BEFORE INSERTING A NEW RING, Disp: 3 each, Rfl: 3 .  lamoTRIgine (LAMICTAL) 150 MG tablet, Take 1 tablet (150 mg total) by mouth daily., Disp: 90 tablet, Rfl: 1 .  lamoTRIgine (LAMICTAL) 25 MG tablet, Take 1 tablet (25 mg total) by mouth daily. To be combined with Lamictal 150 MG to make 175 mg, Disp: 90 tablet, Rfl: 0 .  levothyroxine (SYNTHROID) 75 MCG tablet, TAKE 1 TABLET (75 MCG TOTAL) BY MOUTH DAILY BEFORE BREAKFAST., Disp: 30 tablet, Rfl: 0 .  metFORMIN (GLUCOPHAGE-XR) 500 MG 24 hr tablet, TAKE 3 TABLETS (1,500 MG TOTAL) BY MOUTH DAILY., Disp: 270 tablet, Rfl: 3 .  polyethylene glycol (MIRALAX / GLYCOLAX) packet, Take 17 g by mouth as needed. , Disp: , Rfl:   .  QUEtiapine (SEROQUEL) 50 MG tablet, Take 1 tablet (50 mg total) by mouth at bedtime., Disp: 90 tablet, Rfl: 0 .  traZODone (DESYREL) 50 MG tablet, TAKE 1 AND 1/2 TO 2 TABLETS (75-100 MG TOTAL) BY MOUTH AT BEDTIME AS NEEDED FOR SLEEP. FOR SLEEP, Disp: 180 tablet, Rfl: 1 .  Vitamin D, Ergocalciferol, (DRISDOL) 1.25 MG (50000 UNIT) CAPS capsule,  TAKE 1 CAPSULE (50,000 UNITS TOTAL) BY MOUTH EVERY 7 (SEVEN) DAYS., Disp: 4 capsule, Rfl: 0 .  meclizine (ANTIVERT) 12.5 MG tablet, Take 12.5 mg by mouth 3 (three) times daily as needed for dizziness., Disp: , Rfl:   Allergies  Allergen Reactions  . Date Seed Extract  [Zizyphus Jujuba] Anaphylaxis, Cough, Itching and Swelling  . Other Swelling    Patient Care Team: Delsa Grana, PA-C as PCP - General (Family Medicine) Tiajuana Amass, MD as Referring Physician (Allergy and Immunology)  Review of Systems  Constitutional: Negative.  Negative for activity change, appetite change, fatigue and unexpected weight change.  HENT: Negative.   Eyes: Negative.   Respiratory: Negative.  Negative for cough, choking, chest tightness, shortness of breath and wheezing.   Cardiovascular: Negative.  Negative for chest pain, palpitations and leg swelling.  Gastrointestinal: Negative.  Negative for abdominal pain, blood in stool, constipation, diarrhea, nausea and vomiting.  Endocrine: Negative.   Genitourinary: Negative.   Musculoskeletal: Negative.  Negative for arthralgias, gait problem, joint swelling and myalgias.  Skin: Positive for rash. Negative for pallor.  Allergic/Immunologic: Negative.   Neurological: Negative.  Negative for syncope and weakness.  Hematological: Negative.   Psychiatric/Behavioral: Negative.  Negative for dysphoric mood, self-injury and suicidal ideas. The patient is not nervous/anxious.   All other systems reviewed and are negative.    I personally reviewed active problem list, medication list, allergies, family history, social history,  health maintenance, notes from last encounter, lab results, imaging with the patient/caregiver today.        Objective:   Vitals:  Vitals:   11/11/19 0823  BP: 120/74  Pulse: 85  Resp: 16  Temp: 98.8 F (37.1 C)  SpO2: 99%  Weight: 232 lb 14.4 oz (105.6 kg)  Height: 5' 5"  (1.651 m)    Body mass index is 38.76 kg/m.  Physical Exam Vitals and nursing note reviewed.  Constitutional:      General: She is not in acute distress.    Appearance: Normal appearance. She is well-developed. She is not ill-appearing, toxic-appearing or diaphoretic.     Interventions: Face mask in place.     Comments: Pt overall well appearing, two 2cm areas of hives to right neck, pt itching and scratching arms and legs  HENT:     Head: Normocephalic and atraumatic.     Right Ear: External ear normal.     Left Ear: External ear normal.     Nose: Nose normal. No congestion or rhinorrhea.     Mouth/Throat:     Lips: Pink.     Mouth: Mucous membranes are moist.     Pharynx: Oropharynx is clear. Uvula midline. No pharyngeal swelling, oropharyngeal exudate, posterior oropharyngeal erythema or uvula swelling.     Tonsils: No tonsillar exudate or tonsillar abscesses.     Comments: Airway patent, no stridor  No lip swelling, eye swelling or facial rash or swelling Eyes:     General: Lids are normal. No scleral icterus.       Right eye: No discharge.        Left eye: No discharge.     Conjunctiva/sclera: Conjunctivae normal.     Pupils: Pupils are equal, round, and reactive to light.  Neck:     Trachea: Phonation normal. No tracheal deviation.  Cardiovascular:     Rate and Rhythm: Normal rate and regular rhythm.     Pulses: Normal pulses.          Radial pulses are 2+ on  the right side and 2+ on the left side.       Posterior tibial pulses are 2+ on the right side and 2+ on the left side.     Heart sounds: Normal heart sounds. No murmur heard.  No friction rub. No gallop.   Pulmonary:     Effort:  Pulmonary effort is normal. No respiratory distress.     Breath sounds: Normal breath sounds. No stridor. No wheezing, rhonchi or rales.  Chest:     Chest wall: No tenderness.  Abdominal:     General: Bowel sounds are normal. There is no distension.     Palpations: Abdomen is soft.     Tenderness: There is no abdominal tenderness. There is no guarding or rebound.  Musculoskeletal:        General: No deformity. Normal range of motion.     Cervical back: Normal range of motion and neck supple.     Right lower leg: No edema.     Left lower leg: No edema.  Lymphadenopathy:     Cervical: No cervical adenopathy.  Skin:    General: Skin is warm and dry.     Capillary Refill: Capillary refill takes less than 2 seconds.     Coloration: Skin is not jaundiced or pale.     Findings: Rash present.  Neurological:     Mental Status: She is alert. Mental status is at baseline.     Motor: No abnormal muscle tone.     Gait: Gait normal.  Psychiatric:        Mood and Affect: Mood normal.        Speech: Speech normal.        Behavior: Behavior normal.       Fall Risk: Fall Risk  11/11/2019 03/24/2019 11/08/2018 08/26/2018 05/09/2018  Falls in the past year? 0 0 0 0 0  Number falls in past yr: 0 0 0 - -  Injury with Fall? 0 0 0 - -  Follow up Falls evaluation completed - - - -    Functional Status Survey: Is the patient deaf or have difficulty hearing?: No Does the patient have difficulty seeing, even when wearing glasses/contacts?: No Does the patient have difficulty concentrating, remembering, or making decisions?: No Does the patient have difficulty walking or climbing stairs?: No Does the patient have difficulty dressing or bathing?: No Does the patient have difficulty doing errands alone such as visiting a doctor's office or shopping?: No   Assessment & Plan:    CPE completed today  . USPSTF grade A and B recommendations reviewed with patient; age-appropriate recommendations,  preventive care, screening tests, etc discussed and encouraged; healthy living encouraged; see AVS for patient education given to patient  . Discussed importance of 150 minutes of physical activity weekly, AHA exercise recommendations given to pt in AVS/handout  . Discussed importance of healthy diet:  eating lean meats and proteins, avoiding trans fats and saturated fats, avoid simple sugars and excessive carbs in diet, eat 6 servings of fruit/vegetables daily and drink plenty of water and avoid sweet beverages.    . Recommended pt to do annual eye exam and routine dental exams/cleanings  . Depression, alcohol, fall screening completed as documented above and per flowsheets  . Reviewed Health Maintenance: Health Maintenance  Topic Date Due  . INFLUENZA VACCINE  10/19/2019  . MAMMOGRAM  11/26/2019  . PAP SMEAR-Modifier  10/22/2020  . TETANUS/TDAP  10/10/2025  . COVID-19 Vaccine  Completed  . Hepatitis C  Screening  Completed  . HIV Screening  Completed    . Immunizations: Immunization History  Administered Date(s) Administered  . Influenza, Seasonal, Injecte, Preservative Fre 12/24/2006  . Influenza,inj,Quad PF,6+ Mos 01/03/2017, 11/08/2017  . PFIZER SARS-COV-2 Vaccination 05/07/2019, 05/29/2019  . Td 03/20/2004  . Tdap 10/11/2015      ICD-10-CM   1. Adult general medical exam  Z00.00   2. Encounter for screening mammogram for malignant neoplasm of breast  Z12.31 MM 3D SCREEN BREAST BILATERAL   due for mammogram  3. Hives  L50.9 dexamethasone (DECADRON) injection 10 mg    diphenhydrAMINE (BENADRYL) injection 50 mg   given 25 mg IM benadryl injection and 10 mg decadron in clinic, reviewed anaphylactic rxn and tx at home - benadryl 25-50 mg q4-6 h, +pepcid 20 mg BID  4. Allergic reaction to insect sting, accidental or unintentional, initial encounter  T63.481A dexamethasone (DECADRON) injection 10 mg    diphenhydrAMINE (BENADRYL) injection 50 mg   some kind of insect bite or  sting upon walking into clinic today, hives started locally to right neck and more systemic rxn follow rapidly (see above)  5. Insulin resistance  E88.81    insulin level increased, discussed rechecking in a few months, pt could increase metforming to 1000 mg BID and continue working on diet plan and exercise  6. Hypothyroidism, unspecified type  E03.9    she feels thyroid meds are still working and stable - will recheck at next appt  7. Class 2 severe obesity with serious comorbidity and body mass index (BMI) of 38.0 to 38.9 in adult, unspecified obesity type ([facility])  E66.01    Z68.38    improving weight/BMI - continue with medical weight management - congratulated pt   8. Moderate episode of recurrent major depressive disorder ([facility])  F33.1    improving with med changes - per psych  9. Metabolic syndrome  S81.10    per weight management - doing well with weight loss, cholesterol improving, insulin increased, A1C decreased   10. Insomnia due to mental condition  F51.05    well controlled with current meds - per psych, on seroquel and trazodone (uses trazodone only about once a week)  11. Bipolar 2 disorder, major depressive episode ([place])  F31.81    mood good, well controlled with current meds, seeing psych and therapist, compliant with tx plan and meds  12. Vitamin D deficiency  E55.9    levels improved and were normal, will take high dose vit D supplement every other week to maintain    Allergic reaction - Patient re-evaluated prior to dc, is hemodynamically stable, in no respiratory distress, and denies the feeling of throat closing, hives and itching improving at the time that pt left today  Pt has been advised to take OTC benadryl 25-50 mg q 4-6 hr PRN for rash/itching.  Go to the ER or call 911 if they have a mod-severe allergic rxn (s/s including throat closing, difficulty breathing, swelling of lips face or tongue).  Follow up in clinic if sx not improving or if they return after  completion of steroid taper. Pt is agreeable with plan & verbalizes understanding.  Work note given so pt could go home and go to bed today - reviewed epi pen instructions and indications, reviewed ER precautions   Delsa Grana, PA-C 11/11/19 9:40 AM  [place]

## 2019-11-19 NOTE — Progress Notes (Signed)
Office: [phone]  /  Fax: [phone]    Date: December 03, 2019   Appointment Start Time: 1:55pm Duration: 31 minutes Provider: Lawerance Cruel, Psy.D. Type of Session: Individual Therapy  Location of Patient: Work Location of Provider: Provider's Home Type of Contact: Telepsychological Visit via MyChart Video Visit  Session Content: Kathryn Munoz is a 44 y.o. female presenting for a follow-up appointment to address the previously established treatment goal of increasing coping skills. Today's appointment was a telepsychological visit due to COVID-19. Kathryn Munoz provided verbal consent for today's telepsychological appointment and she is aware she is responsible for securing confidentiality on her end of the session. Prior to proceeding with today's appointment, Kathryn Munoz's physical location at the time of this appointment was obtained as well a phone number she could be reached at in the event of technical difficulties. Kathryn Munoz and this provider participated in today's telepsychological service.   Of note, today's appointment was switched to a regular telephone call at 2:22pm with Celestial's verbal consent due to technical issues.   This provider conducted a brief check-in and verbally administered the PHQ-9 and GAD-7. A risk assessment was completed. Kathryn Munoz denied experiencing suicidal and homicidal ideation, plan, and intent since the last appointment with this provider. She reported she continues to have easy access to the developed safety plan, and continues to acknowledge understanding regarding the importance of reaching out to trusted individuals and/or emergency resources if she is unable to ensure safety.   Kathryn Munoz reported she had a "rough week last," noting she deviated from her meal plan. She stated she is back on track. This was further explored. Kathryn Munoz explained she was experiencing decreased mood due to dealing with her mother's estate. Due to ongoing worry with events, she indicated  her sleep has been impacted. Thus, psychoeducation regarding sleep hygiene was provided. Kathryn Munoz provided verbal consent during today's appointment for this provider to send a handout about sleep hygiene via e-mail. In addition to the discussed strategies, she stated a plan to incorporate mindfulness exercises into her nighttime routine. Notably, Kathryn Munoz shared her other therapist was on sabbatical and then was out of office due to a family emergency. It was recommended Kathryn Munoz reach out to schedule an appointment; she agreed.  Kathryn Munoz was receptive to today's appointment as evidenced by openness to sharing, responsiveness to feedback, and willingness to implement discussed strategies .  Mental Status Examination:  Appearance: well groomed and appropriate hygiene  Behavior: appropriate to circumstances Mood: euthymic Affect: mood congruent Speech: normal in rate, volume, and tone Eye Contact: appropriate Psychomotor Activity: appropriate Gait: unable to assess Thought Process: linear, logical, and goal directed  Thought Content/Perception: denies suicidal and homicidal ideation, plan, and intent and no hallucinations, delusions, bizarre thinking or behavior reported or observed Orientation: time, person, place, and purpose of appointment Memory/Concentration: memory, attention, language, and fund of knowledge intact  Insight/Judgment: good  Structured Assessments Results: The Patient Health Questionnaire-9 (PHQ-9) is a self-report measure that assesses symptoms and severity of depression over the course of the last two weeks. Kathryn Munoz obtained a score of 10 suggesting moderate depression. Kathryn Munoz finds the endorsed symptoms to be somewhat difficult. [0= Not at all; 1= Several days; 2= More than half the days; 3= Nearly every day] Little interest or pleasure in doing things 2  Feeling down, depressed, or hopeless 1  Trouble falling or staying asleep, or sleeping too much 3  Feeling tired or  having little energy 0  Poor appetite or overeating 1  Feeling bad about yourself ---  or that you are a failure or have let yourself or your family down 0  Trouble concentrating on things, such as reading the newspaper or watching television 1  Moving or speaking so slowly that other people could have noticed? Or the opposite --- being so fidgety or restless that you have been moving around a lot more than usual 2  Thoughts that you would be better off dead or hurting yourself in some way 0  PHQ-9 Score 10    The Generalized Anxiety Disorder-7 (GAD-7) is a brief self-report measure that assesses symptoms of anxiety over the course of the last two weeks. Kathryn Munoz obtained a score of 18 suggesting severe anxiety. Kathryn Munoz finds the endorsed symptoms to be very difficult. [0= Not at all; 1= Several days; 2= Over half the days; 3= Nearly every day] Feeling nervous, anxious, on edge 3  Not being able to stop or control worrying 3  Worrying too much about different things 3  Trouble relaxing 3  Being so restless that it's hard to sit still 0  Becoming easily annoyed or irritable 3  Feeling afraid as if something awful might happen 3  GAD-7 Score 18   Interventions:  Conducted a brief chart review Verbally administered PHQ-9 and GAD-7 for symptom monitoring Conducted a risk assessment Provided empathic reflections and validation Employed supportive psychotherapy interventions to facilitate reduced distress and to improve coping skills with identified stressors Psychoeducation provided regarding sleep hygiene  DSM-5 Diagnosis(es): 307.59 (F50.8) Other Specified Feeding or Eating Disorder, Emotional Eating Behaviors and 296.80 (F31.9) Unspecified Bipolar and Related Disorder  Treatment Goal & Progress: During the initial appointment with this provider, the following treatment goal was established: increase coping skills. Kathryn Munoz has demonstrated progress in her goal as evidenced by increased  awareness of hunger patterns and increased awareness of triggers for emotional eating. Kathryn Munoz also continues to demonstrate willingness to engage in learned skill(s).  Plan: Kathryn Munoz requested an additional appointment to assist with accountability due to recent deviations. As such, the next appointment will be scheduled in two weeks, which will be via MyChart Video Visit. The next session will focus on working towards the established treatment goal. Additionally, Kathryn Munoz agreed to reach out to her other therapist today to schedule a follow-up appointment.

## 2019-11-20 ENCOUNTER — Other Ambulatory Visit: Payer: Self-pay

## 2019-11-20 ENCOUNTER — Ambulatory Visit (INDEPENDENT_AMBULATORY_CARE_PROVIDER_SITE_OTHER): Payer: BC Managed Care – PPO | Admitting: Bariatrics

## 2019-11-20 ENCOUNTER — Encounter (INDEPENDENT_AMBULATORY_CARE_PROVIDER_SITE_OTHER): Payer: Self-pay | Admitting: Bariatrics

## 2019-11-20 VITALS — BP 124/84 | HR 76 | Temp 98.3°F | Ht 65.0 in | Wt 227.0 lb

## 2019-11-20 DIAGNOSIS — Z6837 Body mass index (BMI) 37.0-37.9, adult: Secondary | ICD-10-CM

## 2019-11-20 DIAGNOSIS — E8881 Metabolic syndrome: Secondary | ICD-10-CM | POA: Diagnosis not present

## 2019-11-20 DIAGNOSIS — F3289 Other specified depressive episodes: Secondary | ICD-10-CM | POA: Diagnosis not present

## 2019-11-20 NOTE — Progress Notes (Signed)
Chief Complaint:   OBESITY Kathryn Munoz is here to discuss her progress with her obesity treatment plan along with follow-up of her obesity related diagnoses. Kathryn Munoz is on the Category 3 Plan and states she is following her eating plan approximately 60% of the time. Kathryn Munoz states she is walking 20-30 minutes 3 times per week.  Today's visit was #: 8 Starting weight: 252 lbs Starting date: 07/17/2019 Today's weight: 227 lbs Today's date: 11/20/2019 Total lbs lost to date: 25 Total lbs lost since last in-office visit: 1  Interim History: Kathryn Munoz is down 1 lb. She states she has increased her water intake.  Subjective:   Other depression, with emotional eating. Kathryn Munoz is struggling with emotional eating and using food for comfort to the extent that it is negatively impacting her health. She has been working on behavior modification techniques to help reduce her emotional eating and has been somewhat successful. She shows no sign of suicidal or homicidal ideations. Kathryn Munoz sees Dr. Dewaine Conger. She is taking Lamictal and Seroquel.  Insulin resistance. Kathryn Munoz has a diagnosis of insulin resistance based on her elevated fasting insulin level >5. She continues to work on diet and exercise to decrease her risk of diabetes. She reports increased appetite.  Lab Results  Component Value Date   INSULIN 32.5 (H) 10/14/2019   INSULIN 19.6 07/17/2019   Lab Results  Component Value Date   HGBA1C 5.2 10/14/2019   Assessment/Plan:   Other depression, with emotional eating. Behavior modification techniques were discussed today to help Shatavia deal with her emotional/non-hunger eating behaviors.  Orders and follow up as documented in patient record. Kathryn Munoz will continue follow-up with Dr. Dewaine Conger and will continue her medications as directed.   Insulin resistance. Kathryn Munoz will continue to work on weight loss, exercise, increasing healthy fats and protein, and decreasing simple carbohydrates to  help decrease the risk of diabetes. Kathryn Munoz agreed to follow-up with [place] as directed to closely monitor her progress.  Class 2 severe obesity with serious comorbidity and body mass index (BMI) of 37.0 to 37.9 in adult, unspecified obesity type (HCC).  Kathryn Munoz is currently in the action stage of change. As such, her goal is to continue with weight loss efforts. She has agreed to the Category 3 Plan.   She will work on meal planning and intentional eating.   Exercise goals: Kathryn Munoz will walk more for exercise.  Behavioral modification strategies: increasing lean protein intake, decreasing simple carbohydrates, increasing vegetables, increasing water intake, decreasing eating out, no skipping meals, meal planning and cooking strategies and keeping healthy foods in the home.  Kathryn Munoz has agreed to follow-up with our clinic in 2-3 weeks. She was informed of the importance of frequent follow-up visits to maximize her success with intensive lifestyle modifications for her multiple health conditions.   Objective:   Blood pressure 124/84, pulse 76, temperature 98.3 F (36.8 C), height 5\' 5"  (1.651 m), weight 227 lb (103 kg), SpO2 96 %. Body mass index is 37.77 kg/m.  General: Cooperative, alert, well developed, in no acute distress. HEENT: Conjunctivae and lids unremarkable. Cardiovascular: Regular rhythm.  Lungs: Normal work of breathing. Neurologic: No focal deficits.   Lab Results  Component Value Date   CREATININE 0.97 10/14/2019   BUN 9 10/14/2019   NA 139 10/14/2019   K 4.6 10/14/2019   CL 104 10/14/2019   CO2 22 10/14/2019   Lab Results  Component Value Date   ALT 13 10/14/2019   AST 16 10/14/2019  ALKPHOS 65 10/14/2019   BILITOT 0.4 10/14/2019   Lab Results  Component Value Date   HGBA1C 5.2 10/14/2019   HGBA1C 5.5 07/17/2019   HGBA1C 5.7 (H) 03/31/2019   HGBA1C 4.8 05/09/2018   HGBA1C 5.5 10/22/2017   Lab Results  Component Value Date   INSULIN 32.5 (H) 10/14/2019    INSULIN 19.6 07/17/2019   Lab Results  Component Value Date   TSH 2.210 07/17/2019   Lab Results  Component Value Date   CHOL 186 10/14/2019   HDL 55 10/14/2019   LDLCALC 113 (H) 10/14/2019   TRIG 102 10/14/2019   CHOLHDL 3.7 03/31/2019   Lab Results  Component Value Date   WBC 8.0 07/17/2019   HGB 12.6 07/17/2019   HCT 38.6 07/17/2019   MCV 98 (H) 07/17/2019   PLT 319 07/17/2019   No results found for: IRON, TIBC, FERRITIN  Attestation Statements:   Reviewed by clinician on day of visit: allergies, medications, problem list, medical history, surgical history, family history, social history, and previous encounter notes.  Time spent on visit including pre-visit chart review and post-visit charting and care was 20 minutes.   Fernanda Drum, am acting as Energy manager for [employer], DO   I have reviewed the above documentation for accuracy and completeness, and I agree with the above. Corinna Capra, DO

## 2019-11-22 ENCOUNTER — Other Ambulatory Visit (INDEPENDENT_AMBULATORY_CARE_PROVIDER_SITE_OTHER): Payer: Self-pay | Admitting: Adult Health

## 2019-11-22 DIAGNOSIS — E038 Other specified hypothyroidism: Secondary | ICD-10-CM

## 2019-11-23 ENCOUNTER — Other Ambulatory Visit (INDEPENDENT_AMBULATORY_CARE_PROVIDER_SITE_OTHER): Payer: Self-pay | Admitting: Adult Health

## 2019-11-23 DIAGNOSIS — E559 Vitamin D deficiency, unspecified: Secondary | ICD-10-CM

## 2019-12-03 ENCOUNTER — Telehealth (INDEPENDENT_AMBULATORY_CARE_PROVIDER_SITE_OTHER): Payer: BC Managed Care – PPO | Admitting: Psychology

## 2019-12-03 DIAGNOSIS — F319 Bipolar disorder, unspecified: Secondary | ICD-10-CM

## 2019-12-03 DIAGNOSIS — F5089 Other specified eating disorder: Secondary | ICD-10-CM

## 2019-12-03 NOTE — Progress Notes (Signed)
Office: [phone]  /  Fax: [phone]    Date: December 17, 2019   Appointment Start Time: 2:05pm Duration: 26 minutes Provider: Lawerance Cruel, Psy.D. 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 This provider was able to connect with [facility] via telephone at 2:05pm and she provided verbal consent to proceed via a telephone call given the technical difficulties. As such, today's appointment was initiated 5 minutes late. Kathryn Munoz is a 44 y.o. female presenting for a follow-up appointment to address the previously established treatment goal of increasing coping skills. Today's appointment was a telepsychological visit due to COVID-19. Kathryn Munoz provided verbal consent for today's telepsychological appointment and she is aware she is responsible for securing confidentiality on her end of the session. Prior to proceeding with today's appointment, Kathryn Munoz's physical location at the time of this appointment was obtained as well a phone number she could be reached at in the event of technical difficulties. Maddyn and this provider participated in today's telepsychological service.   This provider conducted a brief check-in and verbally administered the PHQ-9 and GAD-7. Kathryn Munoz reported she continues to lose weight, but is "feeling a little depressed" in the past week resulting in emotional eating. She discussed ongoing stress related to selling her mother's home. Reviewed sleep hygiene. She reported focusing on establishing a routine. She stated ongoing sleep issues, but indicated she continues to engage in the strategies as they have been helpful overall. This provider recommended  longer-term therapeutic services due to the aforementioned and discussed options to establish care with a new provider. [name] provided verbal consent for this provider to place a referral with [facility] to address grief, depression, and anxiety. Remainder of today's appointment focused on problem solving to assist Kathryn Munoz to eat congruent to her meal plan. Kathryn Munoz and this provider discussed the dieting mentality. Additionally, she was receptive to focusing on protein intake and portion control. She also expressed willingness to develop a Theatre stage manager list with foods congruent to her meal plan that her husband can utilize for grocery shopping on a weekly basis. She also noted a plan to go grocery shopping while at work to leave foods at work to ensure she is eating congruent to her meal plan on work days. Overall, Kathryn Munoz was receptive to today's appointment as evidenced by openness to sharing, responsiveness to feedback, and willingness to implement discussed strategies .  Mental Status Examination:  Appearance: unable to assess  Behavior: unable to assess Mood: sad Affect: unable to fully assess Speech: normal in rate, volume, and tone Eye Contact: unable to assess Psychomotor Activity: unable to assess  Gait: unable to assess Thought Process: linear, logical, and goal directed  Thought Content/Perception: denies suicidal and homicidal ideation, plan, and intent, no hallucinations, delusions, bizarre thinking or behavior reported or observed and denies ideation and engagement in self-injurious behaviors Orientation: time, person, place, and purpose of appointment Memory/Concentration: memory, attention, language, and fund of knowledge intact  Insight/Judgment: fair  Structured Assessments Results: The Patient Health Questionnaire-9 (PHQ-9) is a self-report measure that assesses symptoms and severity of depression over the course of the last two weeks. Kathryn Munoz obtained a score  of 10 suggesting moderate depression. Kathryn Munoz finds the endorsed symptoms to be very difficult. [0= Not  at all; 1= Several days; 2= More than half the days; 3= Nearly every day] Little interest or pleasure in doing things 2  Feeling down, depressed, or hopeless 1  Trouble falling or staying asleep, or sleeping too much 2  Feeling tired or having little energy 1  Poor appetite or overeating 2  Feeling bad about yourself --- or that you are a failure or have let yourself or your family down 1  Trouble concentrating on things, such as reading the newspaper or watching television 1  Moving or speaking so slowly that other people could have noticed? Or the opposite --- being so fidgety or restless that you have been moving around a lot more than usual 0  Thoughts that you would be better off dead or hurting yourself in some way 0  PHQ-9 Score 10    The Generalized Anxiety Disorder-7 (GAD-7) is a brief self-report measure that assesses symptoms of anxiety over the course of the last two weeks. Kathryn Munoz obtained a score of 16 suggesting severe anxiety. Kathryn Munoz finds the endorsed symptoms to be very difficult. [0= Not at all; 1= Several days; 2= Over half the days; 3= Nearly every day] Feeling nervous, anxious, on edge 3  Not being able to stop or control worrying 3  Worrying too much about different things 3  Trouble relaxing 2  Being so restless that it's hard to sit still 1  Becoming easily annoyed or irritable 1  Feeling afraid as if something awful might happen- related to selling mother's home 3  GAD-7 Score 16   Interventions:  Conducted a brief chart review Verbally administered PHQ-9 and GAD-7 for symptom monitoring Conducted a risk assessment Provided empathic reflections and validation Employed supportive psychotherapy interventions to facilitate reduced distress and to improve coping skills with identified stressors Engaged patient in problem solving Recommended/discussed option for  longer-term therapeutic services  DSM-5 Diagnosis(es): 307.59 (F50.8) Other Specified Feeding or Eating Disorder, Emotional Eating Behaviors and 296.80 (F31.9) Unspecified Bipolar and Related Disorder  Treatment Goal & Progress: During the initial appointment with this provider, the following treatment goal was established: increase coping skills. Cedra has demonstrated progress in her goal as evidenced by increased awareness of hunger patterns and increased awareness of triggers for emotional eating. Hannahmarie also continues to demonstrate willingness to engage in learned skill(s).  Plan: Due to current challenges per Deloria's self-report, an additional follow-up appointment was recommended. She agreed. As such, the next appointment will be scheduled in two weeks, which will be via MyChart Video Visit. The next session will focus on working towards the established treatment goal.

## 2019-12-11 ENCOUNTER — Ambulatory Visit (INDEPENDENT_AMBULATORY_CARE_PROVIDER_SITE_OTHER): Payer: BC Managed Care – PPO | Admitting: Bariatrics

## 2019-12-11 ENCOUNTER — Other Ambulatory Visit: Payer: Self-pay

## 2019-12-11 ENCOUNTER — Encounter (INDEPENDENT_AMBULATORY_CARE_PROVIDER_SITE_OTHER): Payer: Self-pay | Admitting: Bariatrics

## 2019-12-11 VITALS — BP 96/65 | HR 76 | Temp 98.4°F | Ht 65.0 in | Wt 225.0 lb

## 2019-12-11 DIAGNOSIS — E8881 Metabolic syndrome: Secondary | ICD-10-CM | POA: Diagnosis not present

## 2019-12-11 DIAGNOSIS — Z6837 Body mass index (BMI) 37.0-37.9, adult: Secondary | ICD-10-CM | POA: Diagnosis not present

## 2019-12-11 DIAGNOSIS — E66812 Obesity, class 2: Secondary | ICD-10-CM

## 2019-12-11 DIAGNOSIS — Z9189 Other specified personal risk factors, not elsewhere classified: Secondary | ICD-10-CM

## 2019-12-11 DIAGNOSIS — E88819 Insulin resistance, unspecified: Secondary | ICD-10-CM

## 2019-12-11 DIAGNOSIS — E559 Vitamin D deficiency, unspecified: Secondary | ICD-10-CM

## 2019-12-11 MED ORDER — VITAMIN D (ERGOCALCIFEROL) 1.25 MG (50000 UNIT) PO CAPS: 50000.0000 [IU] | ORAL_CAPSULE | ORAL | 0 refills | Status: DC

## 2019-12-15 NOTE — Progress Notes (Signed)
Chief Complaint:   OBESITY Kathryn Munoz is here to discuss her progress with her obesity treatment plan along with follow-up of her obesity related diagnoses. Kathryn Munoz is on the Category 3 Plan and states she is following her eating plan approximately 85% of the time. Kathryn Munoz states she is exercising for 0 minutes 0 times per week.  Today's visit was #: 9 Starting weight: 252 lbs Starting date: 07/17/2019 Today's weight: 225 lbs Today's date: 12/11/2019 Total lbs lost to date: 27 lbs Total lbs lost since last in-office visit: 2 lbs  Interim History: Kathryn Munoz is down 2 pounds and is doing well overall.  She is not always sticking to the 300 calories.  Subjective:   1. Vitamin D deficiency Kathryn Munoz's Vitamin D level was 55.7 on 10/14/2019. She is currently taking prescription vitamin D 50,000 IU each week. She denies nausea, vomiting or muscle weakness.    2. Insulin resistance Kathryn Munoz has a diagnosis of insulin resistance based on her elevated fasting insulin level >5. She continues to work on diet and exercise to decrease her risk of diabetes.  She is taking metformin.  Lab Results  Component Value Date   INSULIN 32.5 (H) 10/14/2019   INSULIN 19.6 07/17/2019   Lab Results  Component Value Date   HGBA1C 5.2 10/14/2019   3. At risk for hypoglycemia Kathryn Munoz is at increased risk for hypoglycemia due to insulin resistance.   Assessment/Plan:   1. Vitamin D deficiency Low Vitamin D level contributes to fatigue and are associated with obesity, breast, and colon cancer. She agrees to continue to take prescription Vitamin D @50 ,000 IU every week and will follow-up for routine testing of Vitamin D, at least 2-3 times per year to avoid over-replacement.  -Refill Vitamin D, Ergocalciferol, (DRISDOL) 1.25 MG (50000 UNIT) CAPS capsule; Take 1 capsule (50,000 Units total) by mouth every 7 (seven) days.  Dispense: 4 capsule; Refill: 0  2. Insulin resistance Harrison will continue to work on  weight loss, exercise, and decreasing simple carbohydrates to help decrease the risk of diabetes. Shavaughn agreed to follow-up with [facility] as directed to closely monitor her progress.  Continue metformin.  3. At risk for hypoglycemia Kathryn Munoz was given approximately 15 minutes of counseling today regarding prevention of hypoglycemia. She was advised of symptoms of hypoglycemia. Kathryn Munoz was instructed to avoid skipping meals, eat regular protein rich meals and schedule low calorie snacks as needed.   Repetitive spaced learning was employed today to elicit superior memory formation and behavioral change  4. Class 2 severe obesity with serious comorbidity and body mass index (BMI) of 37.0 to 37.9 in adult, unspecified obesity type [facility]) Kathryn Munoz is currently in the action stage of change. As such, her goal is to continue with weight loss efforts. She has agreed to the Category 3 Plan.   She will work on meal planning, mindful eating, increasing protein intake, minimizing snacking, 300 calories (do not exceed).  Exercise goals: Will use her elliptical at home.  Behavioral modification strategies: increasing lean protein intake, decreasing simple carbohydrates, increasing vegetables, increasing water intake, decreasing eating out, no skipping meals, meal planning and cooking strategies, keeping healthy foods in the home and planning for success.  Kathryn Munoz has agreed to follow-up with our clinic in 3-4 weeks. She was informed of the importance of frequent follow-up visits to maximize her success with intensive lifestyle modifications for her multiple health conditions.   Objective:   Blood pressure 96/65, pulse 76, temperature 98.4 F (36.9 C), height 5'  5" (1.651 m), weight 225 lb (102.1 kg), SpO2 98 %. Body mass index is 37.44 kg/m.  General: Cooperative, alert, well developed, in no acute distress. HEENT: Conjunctivae and lids unremarkable. Cardiovascular: Regular rhythm.  Lungs: Normal work of  breathing. Neurologic: No focal deficits.   Lab Results  Component Value Date   CREATININE 0.97 10/14/2019   BUN 9 10/14/2019   NA 139 10/14/2019   K 4.6 10/14/2019   CL 104 10/14/2019   CO2 22 10/14/2019   Lab Results  Component Value Date   ALT 13 10/14/2019   AST 16 10/14/2019   ALKPHOS 65 10/14/2019   BILITOT 0.4 10/14/2019   Lab Results  Component Value Date   HGBA1C 5.2 10/14/2019   HGBA1C 5.5 07/17/2019   HGBA1C 5.7 (H) 03/31/2019   HGBA1C 4.8 05/09/2018   HGBA1C 5.5 10/22/2017   Lab Results  Component Value Date   INSULIN 32.5 (H) 10/14/2019   INSULIN 19.6 07/17/2019   Lab Results  Component Value Date   TSH 2.210 07/17/2019   Lab Results  Component Value Date   CHOL 186 10/14/2019   HDL 55 10/14/2019   LDLCALC 113 (H) 10/14/2019   TRIG 102 10/14/2019   CHOLHDL 3.7 03/31/2019   Lab Results  Component Value Date   WBC 8.0 07/17/2019   HGB 12.6 07/17/2019   HCT 38.6 07/17/2019   MCV 98 (H) 07/17/2019   PLT 319 07/17/2019   Attestation Statements:   Reviewed by clinician on day of visit: allergies, medications, problem list, medical history, surgical history, family history, social history, and previous encounter notes.  I, Insurance claims handler, CMA, am acting as Energy manager for [employer], DO  I have reviewed the above documentation for accuracy and completeness, and I agree with the above. Corinna Capra, DO

## 2019-12-16 ENCOUNTER — Encounter (INDEPENDENT_AMBULATORY_CARE_PROVIDER_SITE_OTHER): Payer: Self-pay | Admitting: Bariatrics

## 2019-12-17 ENCOUNTER — Other Ambulatory Visit (INDEPENDENT_AMBULATORY_CARE_PROVIDER_SITE_OTHER): Payer: Self-pay | Admitting: Adult Health

## 2019-12-17 ENCOUNTER — Telehealth (INDEPENDENT_AMBULATORY_CARE_PROVIDER_SITE_OTHER): Payer: BC Managed Care – PPO | Admitting: Psychology

## 2019-12-17 DIAGNOSIS — E038 Other specified hypothyroidism: Secondary | ICD-10-CM

## 2019-12-17 DIAGNOSIS — F319 Bipolar disorder, unspecified: Secondary | ICD-10-CM | POA: Diagnosis not present

## 2019-12-17 DIAGNOSIS — F5089 Other specified eating disorder: Secondary | ICD-10-CM | POA: Diagnosis not present

## 2019-12-17 NOTE — Progress Notes (Signed)
Office: [phone]  /  Fax: [phone]    Date: December 31, 2019   Appointment Start Time: 2:31pm Duration: 27 minutes Provider: Lawerance Cruel, Psy.D. 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 Assistance was provided. This provider called her again at 2:30pm as she was still unable to connect. Kathryn Munoz provided verbal consent to proceed via a regular telephone call. Kathryn Munoz Kathryn a 44 y.o. female presenting for a follow-up appointment to address the previously established treatment goal of increasing coping skills. Today's appointment was a telepsychological visit due to COVID-19. Kathryn Munoz provided verbal consent for today's telepsychological appointment and she Kathryn aware she Kathryn responsible for securing confidentiality on her end of the session. Prior to proceeding with today's appointment, Kathryn Munoz physical location at the time of this appointment was obtained as well a phone number she could be reached at in the event of technical difficulties. Kathryn Munoz and this provider participated in today's telepsychological service.   This provider conducted a brief check-in. Kathryn Munoz reported she feels she may be "in denial" about engaging in binge eating behaviors. This was further explored. She recalled two days last week where she was "mindlessly searching for food" when upset. Notably, she indicated the aforementioned has improved and she Kathryn "trying to stick to the plan as much as possible." Positive reinforcement was provided and it was reflected there Kathryn a reduction in all or nothing thinking. She agreed. Moreover, Kathryn Munoz and this provider explored learned skills she could have engaged in to help her cope in those  moments that resulted in emotional eating. Furthermore, a plan was developed to help Kathryn Munoz cope with emotional eating in the future using learned skills. She wrote down the following plan: focus on hydration; be prepared with snacks congruent to the meal plan; pause to ask questions when experiencing cravings/urges to eat (e.g., Kathryn I really hungry?, Kathryn there something bothering me?, and Kathryn I feel better if I eat?); and engage in discussed coping strategies after going through the aforementioned questions. Overall, Kathryn Munoz was receptive to today's appointment as evidenced by openness to sharing, responsiveness to feedback, and willingness to continue engaging in learned skills.  Mental Status Examination:  Appearance: unable to assess  Behavior: unable to assess Mood: euthymic Affect: unable to fully assess Speech: normal in rate, volume, and tone Eye Contact: unable to assess Psychomotor Activity: unable to assess  Gait: unable to assess Thought Process: linear, logical, and goal directed  Thought Content/Perception: denies suicidal and homicidal ideation, plan, and intent, no hallucinations, delusions, bizarre thinking or behavior reported or observed and denies ideation and engagement in self-injurious behaviors Orientation: time, person, place, and purpose of appointment Memory/Concentration: memory, attention, language, and fund of knowledge intact  Insight/Judgment: fair  Interventions:  Conducted a brief chart review Provided empathic reflections and validation Employed supportive psychotherapy interventions to facilitate reduced distress and to improve coping skills with identified stressors Reviewed learned skills  DSM-5 Diagnosis(es): 307.59 (F50.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 Kathryn Munoz demonstrated progress in her goal as evidenced by increased awareness of hunger patterns and increased awareness of triggers  for emotional eating. Kathryn Munoz also continues to demonstrate willingness to engage in learned skill(s).  Plan: As previously planned, today was Kathryn Munoz's last appointment with this provider. She acknowledged understanding that she may request a follow-up appointment with this provider in the future as long as she Kathryn still established with the clinic. Notably, this provider discussed her upcoming maternity leave toward the end of November. Kathryn Munoz acknowledged understanding given the uncertain nature of the circumstances, this provider may be out of the office sooner. Kathryn Munoz Kathryn be initiating therapeutic services with [facility] on January 09, 2020 with Dr. Haig Prophet. All questions/concerns were addressed. [name] denied any concerns. No further follow-up planned by this provider.

## 2019-12-23 ENCOUNTER — Encounter: Payer: Self-pay | Admitting: Psychiatry

## 2019-12-23 ENCOUNTER — Other Ambulatory Visit: Payer: Self-pay

## 2019-12-23 ENCOUNTER — Telehealth (INDEPENDENT_AMBULATORY_CARE_PROVIDER_SITE_OTHER): Payer: BC Managed Care – PPO | Admitting: Psychiatry

## 2019-12-23 DIAGNOSIS — F429 Obsessive-compulsive disorder, unspecified: Secondary | ICD-10-CM

## 2019-12-23 DIAGNOSIS — F5105 Insomnia due to other mental disorder: Secondary | ICD-10-CM | POA: Diagnosis not present

## 2019-12-23 DIAGNOSIS — F3176 Bipolar disorder, in full remission, most recent episode depressed: Secondary | ICD-10-CM | POA: Diagnosis not present

## 2019-12-23 MED ORDER — QUETIAPINE FUMARATE 25 MG PO TABS: 75.0000 mg | ORAL_TABLET | Freq: Every day | ORAL | 1 refills | Status: DC

## 2019-12-23 NOTE — Progress Notes (Signed)
Provider Location : [facility] Patient Location : Work  Participants: Patient , Provider  Virtual Visit via Telephone Note  I connected with Kathryn Munoz on 12/23/19 at  2:00 PM EDT by telephone and verified that I am speaking with the correct person using two identifiers.   I discussed the limitations, risks, security and privacy concerns of performing an evaluation and management service by telephone and the availability of in person appointments. I also discussed with the patient that there may be a patient responsible charge related to this service. The patient expressed understanding and agreed to proceed.     I discussed the assessment and treatment plan with the patient. The patient was provided an opportunity to ask questions and all were answered. The patient agreed with the plan and demonstrated an understanding of the instructions.   The patient was advised to call back or seek an in-person evaluation if the symptoms worsen or if the condition fails to improve as anticipated.  BH MD OP Progress Note  12/23/2019 2:08 PM Kathryn Munoz  MRN:  [id number]  Chief Complaint:  Chief Complaint    Follow-up     HPI: Kathryn Munoz is a 44 year old African-American female, lives in [place], has a history of bipolar disorder, insomnia, OCD, insulin resistance, hypothyroidism, other specified eating disorder, was evaluated by phone today.  Patient today reports she is currently going through psychosocial stressors of selling her house.  She reports that does make her anxious.  She however has been coping okay so far.  She reports because of the situational stressors she has been having sleep issues the past 1 week.  She reports she does not take the trazodone anymore with the Seroquel since it makes her groggy when she wakes up in the morning.  Patient denies any suicidality, homicidality or perceptual disturbances.  She continues to follow-up with her weight loss clinic and currently  follows up with a psychologist there.  She reports therapy sessions is going well.  She denies any other concerns today.  Visit Diagnosis:    ICD-10-CM   1. Bipolar disorder, in full remission, most recent episode depressed (HCC)  F31.76   2. Insomnia due to mental condition  F51.05 QUEtiapine (SEROQUEL) 25 MG tablet  3. 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  F42.9     Past Psychiatric History: I have reviewed past psychiatric history, progress note on 11/08/2017.  Past trials of Celexa.  Past Medical History:  Past Medical History:  Diagnosis Date  . Abnormal mammogram    breast biopsy, [facility] 2014  . ADD (attention deficit disorder)   . Anxiety   . Bipolar 2 disorder (HCC)   . Constipation   . Depression   . Dermatitis   . Heavy menstrual bleeding   . History of gestational diabetes   . History of kidney stones 2014  . Hypothyroidism   . IFG (impaired fasting glucose)   . Insulin resistance   . Irregular periods   . Joint pain   . Multiple food allergies    Dates and nuts  . Neuropathy   . Obesity   . OCD (obsessive compulsive disorder)   . Prediabetes   . Vitamin D deficiency disease     Past Surgical History:  Procedure Laterality Date  . BREAST BIOPSY Left 2014   [place] bx/clip-neg  . INTRAUTERINE DEVICE INSERTION  03/11/2012    Family Psychiatric History: I have reviewed family psychiatric history from my progress note on 11/08/2017  Family History:  Family History  Problem Relation Age of Onset  . Diabetes Mother   . Pancreatitis Mother   . Hypertension Mother   . Alcohol abuse Mother   . Schizophrenia Mother   . Depression Mother   . Heart disease Mother   . Stroke Mother   . Thyroid disease Mother   . Kidney disease Mother   . Anxiety disorder Mother   . Alcohol abuse Father   . Diabetes Sister        pre-diabetic  . Diabetes Brother   . Drug abuse Brother   . ADD / ADHD Son   . Eczema Son   . Breast cancer Cousin        pat  cousin  . Anxiety disorder Other   . Ovarian cancer Neg Hx     Social History: I have reviewed social history from my progress note on 11/08/2017 Social History   Socioeconomic History  . Marital status: Married    Spouse name: Aline Wesche  . Number of children: 2  . Years of education: Not on file  . Highest education level: Bachelor's degree (e.g., BA, AB, BS)  Occupational History  . Occupation: Charity fundraiser  Tobacco Use  . Smoking status: Never Smoker  . Smokeless tobacco: Never Used  Vaping Use  . Vaping Use: Never used  Substance and Sexual Activity  . Alcohol use: No  . Drug use: No  . Sexual activity: Yes    Partners: Male    Birth control/protection: Other-see comments    Comment: nuvaring  Other Topics Concern  . Not on file  Social History Narrative  . Not on file   Social Determinants of Health   Financial Resource Strain:   . Difficulty of Paying Living Expenses: Not on file  Food Insecurity:   . Worried About Programme researcher, broadcasting/film/video in the Last Year: Not on file  . Ran Out of Food in the Last Year: Not on file  Transportation Needs:   . Lack of Transportation (Medical): Not on file  . Lack of Transportation (Non-Medical): Not on file  Physical Activity:   . Days of Exercise per Week: Not on file  . Minutes of Exercise per Session: Not on file  Stress:   . Feeling of Stress : Not on file  Social Connections:   . Frequency of Communication with Friends and Family: Not on file  . Frequency of Social Gatherings with Friends and Family: Not on file  . Attends Religious Services: Not on file  . Active Member of Clubs or Organizations: Not on file  . Attends Banker Meetings: Not on file  . Marital Status: Not on file    Allergies:  Allergies  Allergen Reactions  . Date Seed Extract  [Zizyphus Jujuba] Anaphylaxis, Cough, Itching and Swelling  . Other Swelling    Metabolic Disorder Labs: Lab Results  Component Value Date   HGBA1C 5.2  10/14/2019   MPG 117 03/31/2019   MPG 91 05/09/2018   No results found for: PROLACTIN Lab Results  Component Value Date   CHOL 186 10/14/2019   TRIG 102 10/14/2019   HDL 55 10/14/2019   CHOLHDL 3.7 03/31/2019   VLDL 14 10/16/2016   LDLCALC 113 (H) 10/14/2019   LDLCALC 129 (H) 07/17/2019   Lab Results  Component Value Date   TSH 2.210 07/17/2019   TSH 8.40 (H) 03/31/2019    Therapeutic Level Labs: No results found for: LITHIUM No results found for:  VALPROATE No components found for:  CBMZ  Current Medications: Current Outpatient Medications  Medication Sig Dispense Refill  . citalopram (CELEXA) 40 MG tablet Take 1 tablet (40 mg total) by mouth daily. 90 tablet 1  . EPINEPHrine 0.3 mg/0.3 mL IJ SOAJ injection INJECT 0.3 MLS INTO THE MUSCLE ONCE FOR 1 DOSE. FOR LIFE-THREATENING ALLERGIC REACTION / ANAPHYLAXIS  1  . etonogestrel-ethinyl estradiol (NUVARING) 0.12-0.015 MG/24HR vaginal ring INSERT 1 RING VAGINALLY AS DIRECTED. REMOVE AFTER 3 WEEKS & WAIT 7 DAYS BEFORE INSERTING A NEW RING 3 each 3  . lamoTRIgine (LAMICTAL) 150 MG tablet Take 1 tablet (150 mg total) by mouth daily. 90 tablet 1  . lamoTRIgine (LAMICTAL) 25 MG tablet Take 1 tablet (25 mg total) by mouth daily. To be combined with Lamictal 150 MG to make 175 mg 90 tablet 0  . levothyroxine (SYNTHROID) 75 MCG tablet TAKE 1 TABLET (75 MCG TOTAL) BY MOUTH DAILY BEFORE BREAKFAST. 30 tablet 0  . metFORMIN (GLUCOPHAGE-XR) 500 MG 24 hr tablet TAKE 3 TABLETS (1,500 MG TOTAL) BY MOUTH DAILY. 270 tablet 3  . polyethylene glycol (MIRALAX / GLYCOLAX) packet Take 17 g by mouth as needed.     Marland Kitchen QUEtiapine (SEROQUEL) 25 MG tablet Take 3 tablets (75 mg total) by mouth at bedtime. 90 tablet 1  . traZODone (DESYREL) 50 MG tablet TAKE 1 AND 1/2 TO 2 TABLETS (75-100 MG TOTAL) BY MOUTH AT BEDTIME AS NEEDED FOR SLEEP. FOR SLEEP 180 tablet 1  . Vitamin D, Ergocalciferol, (DRISDOL) 1.25 MG (50000 UNIT) CAPS capsule Take 1 capsule (50,000 Units  total) by mouth every 7 (seven) days. 4 capsule 0   No current facility-administered medications for this visit.     Musculoskeletal: Strength & Muscle Tone: UTA Gait & Station: UTA Patient leans: N/A  Psychiatric Specialty Exam: Review of Systems  Psychiatric/Behavioral: Positive for sleep disturbance. The patient is nervous/anxious.   All other systems reviewed and are negative.   There were no vitals taken for this visit.There is no height or weight on file to calculate BMI.  General Appearance: UTA  Eye Contact:  UTA  Speech:  Clear and Coherent  Volume:  Normal  Mood:  Anxious  Affect:  UTA  Thought Process:  Goal Directed and Descriptions of Associations: Intact  Orientation:  Full (Time, Place, and Person)  Thought Content: Logical   Suicidal Thoughts:  No  Homicidal Thoughts:  No  Memory:  Immediate;   Fair Recent;   Fair Remote;   Fair  Judgement:  Fair  Insight:  Fair  Psychomotor Activity:  UTA  Concentration:  Concentration: Fair and Attention Span: Fair  Recall:  [name] of Knowledge: Fair  Language: Fair  Akathisia:  No  Handed:  Right  AIMS (if indicated): UTA  Assets:  Communication Skills Desire for Improvement Housing Social Support  ADL's:  Intact  Cognition: WNL  Sleep:  Restless   Screenings: PHQ2-9     Office Visit from 11/11/2019 in [facility] Office Visit from 07/17/2019 in [facility] Office Visit from 03/24/2019 in [facility] Office Visit from 11/08/2018 in [facility] Nutrition from 08/26/2018 in [facility] [place]  PHQ-2 Total Score 0 6 1 0 2  PHQ-9 Total Score 1 19 5  0 9       Assessment and Plan: Kathryn Munoz is a 44 year old African-American female, married, employed, lives in [place], has a history of bipolar disorder, sleep problems, OCD,  hypothyroidism was evaluated by telemedicine today.  Patient is biologically predisposed given her  history of mental health problems in her family as well as history of trauma.  Patient with recent psychosocial stressors of situational stressors as noted above.  She will continue to benefit from medication readjustment and psychotherapy sessions.  Plan as noted below.  Plan Bipolar disorder in remission Lamictal 175 mg p.o. daily Celexa 40 mg p.o. daily Seroquel as prescribed  OCD-stable Continue CBT. Celexa 40 mg p.o. daily  Insomnia-unstable Patient to work on sleep hygiene techniques Increase Seroquel to 75 mg p.o. nightly. She does have trazodone 50 mg as needed available however has been noncompliant.   Follow-up in clinic in 6 weeks or sooner if needed.  I have spent atleast 20 minutes non face to face with patient today. More than 50 % of the time was spent for preparing to see the patient ( e.g., review of test, records ), ordering medications and test ,psychoeducation and supportive psychotherapy and care coordination,as well as documenting clinical information in electronic health record. This note was generated in part or whole with voice recognition software. Voice recognition is usually quite accurate but there are transcription errors that can and very often do occur. I apologize for any typographical errors that were not detected and corrected.      Jomarie LongsSaramma Dakotah Heiman, MD 12/23/2019, 2:08 PM

## 2019-12-31 ENCOUNTER — Other Ambulatory Visit (INDEPENDENT_AMBULATORY_CARE_PROVIDER_SITE_OTHER): Payer: Self-pay | Admitting: Bariatrics

## 2019-12-31 ENCOUNTER — Telehealth (INDEPENDENT_AMBULATORY_CARE_PROVIDER_SITE_OTHER): Payer: BC Managed Care – PPO | Admitting: Psychology

## 2019-12-31 DIAGNOSIS — F319 Bipolar disorder, unspecified: Secondary | ICD-10-CM

## 2019-12-31 DIAGNOSIS — F5089 Other specified eating disorder: Secondary | ICD-10-CM | POA: Diagnosis not present

## 2019-12-31 DIAGNOSIS — E559 Vitamin D deficiency, unspecified: Secondary | ICD-10-CM

## 2020-01-07 ENCOUNTER — Encounter (INDEPENDENT_AMBULATORY_CARE_PROVIDER_SITE_OTHER): Payer: Self-pay | Admitting: Bariatrics

## 2020-01-07 ENCOUNTER — Ambulatory Visit (INDEPENDENT_AMBULATORY_CARE_PROVIDER_SITE_OTHER): Payer: BC Managed Care – PPO | Admitting: Bariatrics

## 2020-01-07 ENCOUNTER — Other Ambulatory Visit: Payer: Self-pay

## 2020-01-07 VITALS — BP 115/76 | HR 81 | Temp 98.8°F | Ht 65.0 in | Wt 228.0 lb

## 2020-01-07 DIAGNOSIS — E8881 Metabolic syndrome: Secondary | ICD-10-CM

## 2020-01-07 DIAGNOSIS — E559 Vitamin D deficiency, unspecified: Secondary | ICD-10-CM | POA: Diagnosis not present

## 2020-01-07 DIAGNOSIS — Z6838 Body mass index (BMI) 38.0-38.9, adult: Secondary | ICD-10-CM

## 2020-01-08 ENCOUNTER — Encounter (INDEPENDENT_AMBULATORY_CARE_PROVIDER_SITE_OTHER): Payer: Self-pay | Admitting: Bariatrics

## 2020-01-08 DIAGNOSIS — E8881 Metabolic syndrome: Secondary | ICD-10-CM | POA: Insufficient documentation

## 2020-01-08 LAB — VITAMIN D 25 HYDROXY (VIT D DEFICIENCY, FRACTURES): Vit D, 25-Hydroxy: 47.4 ng/mL (ref 30.0–100.0)

## 2020-01-08 LAB — INSULIN, RANDOM: INSULIN: 29 u[IU]/mL — ABNORMAL HIGH (ref 2.6–24.9)

## 2020-01-08 LAB — HEMOGLOBIN A1C
Est. average glucose Bld gHb Est-mCnc: 103 mg/dL
Hgb A1c MFr Bld: 5.2 % (ref 4.8–5.6)

## 2020-01-08 NOTE — Progress Notes (Signed)
Chief Complaint:   OBESITY Kathryn Munoz is here to discuss her progress with her obesity treatment plan along with follow-up of her obesity related diagnoses. Kathryn Munoz is on the Category 3 Plan and states she is following her eating plan approximately 30% of the time. Kathryn Munoz states she is exercising on the elliptical 15 minutes 3 times per week and doing cardio 20 minutes 3 times per week.  Today's visit was #: 10 Starting weight: 252 lbs Starting date: 07/17/2019 Today's weight: 228 lbs Today's date: 01/07/2020 Total lbs lost to date: 24 Total lbs lost since last in-office visit: 0  Interim History: Kathryn Munoz is up 3 lbs, but has done well overall. She states things have been up and down due to stress.  Subjective:   Vitamin D deficiency. Kathryn Munoz is taking Vitamin D supplementation.    Ref. Range 10/14/2019 11:09  Vitamin D, 25-Hydroxy Latest Ref Range: 30.0 - 100.0 ng/mL 55.7   Insulin resistance. Kathryn Munoz has a diagnosis of insulin resistance based on her elevated fasting insulin level >5. She continues to work on diet and exercise to decrease her risk of diabetes. Kathryn Munoz is taking metformin.  Lab Results  Component Value Date   INSULIN 32.5 (H) 10/14/2019   INSULIN 19.6 07/17/2019   Assessment/Plan:   Vitamin D deficiency. Low Vitamin D level contributes to fatigue and are associated with obesity, breast, and colon cancer. VITAMIN D 25 Hydroxy (Vit-D Deficiency, Fractures) level will be checked today.   Insulin resistance. Kathryn Munoz will continue to work on weight loss, exercise, and decreasing simple carbohydrates to help decrease the risk of diabetes. Kathryn Munoz agreed to follow-up with Kathryn Munoz as directed to closely monitor her progress. She will continue metformin as directed. Hemoglobin A1c, Insulin, random labs will be checked today.   Class 2 severe obesity with serious comorbidity and body mass index (BMI) of 38.0 to 38.9 in adult, unspecified obesity type  (HCC).  Kathryn Munoz is currently in the action stage of change. As such, her goal is to continue with weight loss efforts. She has agreed to the Category 3 Plan.    She will work on meal planning, intentional eating, and adhering more closely to the plan.  Exercise goals: Kathryn Munoz will resume exercising.  Behavioral modification strategies: increasing lean protein intake, decreasing simple carbohydrates, increasing vegetables, increasing water intake, decreasing eating out, no skipping meals, meal planning and cooking strategies, keeping healthy foods in the home and planning for success.  Kathryn Munoz has agreed to follow-up with our clinic in 2-3 weeks. She was informed of the importance of frequent follow-up visits to maximize her success with intensive lifestyle modifications for her multiple health conditions.   Kathryn Munoz was informed we would discuss her lab results at her next visit unless there is a critical issue that needs to be addressed sooner. Kathryn Munoz agreed to keep her next visit at the agreed upon time to discuss these results.  Objective:   Blood pressure 115/76, pulse 81, temperature 98.8 F (37.1 C), height 5\' 5"  (1.651 m), weight 228 lb (103.4 kg), SpO2 98 %. Body mass index is 37.94 kg/m.  General: Cooperative, alert, well developed, in no acute distress. HEENT: Conjunctivae and lids unremarkable. Cardiovascular: Regular rhythm.  Lungs: Normal work of breathing. Neurologic: No focal deficits.   Lab Results  Component Value Date   CREATININE 0.97 10/14/2019   BUN 9 10/14/2019   NA 139 10/14/2019   K 4.6 10/14/2019   CL 104 10/14/2019   CO2 22 10/14/2019  Lab Results  Component Value Date   ALT 13 10/14/2019   AST 16 10/14/2019   ALKPHOS 65 10/14/2019   BILITOT 0.4 10/14/2019   Lab Results  Component Value Date   HGBA1C 5.2 10/14/2019   HGBA1C 5.5 07/17/2019   HGBA1C 5.7 (H) 03/31/2019   HGBA1C 4.8 05/09/2018   Lab Results  Component Value Date   INSULIN  32.5 (H) 10/14/2019   INSULIN 19.6 07/17/2019   Lab Results  Component Value Date   TSH 2.210 07/17/2019   Lab Results  Component Value Date   CHOL 186 10/14/2019   HDL 55 10/14/2019   LDLCALC 113 (H) 10/14/2019   TRIG 102 10/14/2019   CHOLHDL 3.7 03/31/2019   Lab Results  Component Value Date   WBC 8.0 07/17/2019   HGB 12.6 07/17/2019   HCT 38.6 07/17/2019   MCV 98 (H) 07/17/2019   PLT 319 07/17/2019   No results found for: IRON, TIBC, FERRITIN  Attestation Statements:   Reviewed by clinician on day of visit: allergies, medications, problem list, medical history, surgical history, family history, social history, and previous encounter notes.  Time spent on visit including pre-visit chart review and post-visit charting and care was 20 minutes.   Fernanda Drum, am acting as Energy manager for [employer], DO   I have reviewed the above documentation for accuracy and completeness, and I agree with the above. Corinna Capra, DO

## 2020-01-09 ENCOUNTER — Ambulatory Visit (INDEPENDENT_AMBULATORY_CARE_PROVIDER_SITE_OTHER): Payer: BC Managed Care – PPO | Admitting: Psychology

## 2020-01-09 DIAGNOSIS — F321 Major depressive disorder, single episode, moderate: Secondary | ICD-10-CM

## 2020-01-15 ENCOUNTER — Other Ambulatory Visit: Payer: Self-pay | Admitting: Psychiatry

## 2020-01-15 DIAGNOSIS — F5105 Insomnia due to other mental disorder: Secondary | ICD-10-CM

## 2020-01-26 ENCOUNTER — Ambulatory Visit: Payer: Self-pay

## 2020-01-29 ENCOUNTER — Ambulatory Visit (INDEPENDENT_AMBULATORY_CARE_PROVIDER_SITE_OTHER): Payer: BC Managed Care – PPO | Admitting: Bariatrics

## 2020-01-29 ENCOUNTER — Encounter (INDEPENDENT_AMBULATORY_CARE_PROVIDER_SITE_OTHER): Payer: Self-pay | Admitting: Bariatrics

## 2020-01-29 ENCOUNTER — Other Ambulatory Visit: Payer: Self-pay

## 2020-01-29 VITALS — BP 117/77 | HR 74 | Temp 98.2°F | Ht 65.0 in | Wt 228.0 lb

## 2020-01-29 DIAGNOSIS — E038 Other specified hypothyroidism: Secondary | ICD-10-CM

## 2020-01-29 DIAGNOSIS — Z9189 Other specified personal risk factors, not elsewhere classified: Secondary | ICD-10-CM | POA: Diagnosis not present

## 2020-01-29 DIAGNOSIS — E559 Vitamin D deficiency, unspecified: Secondary | ICD-10-CM | POA: Diagnosis not present

## 2020-01-29 DIAGNOSIS — Z6838 Body mass index (BMI) 38.0-38.9, adult: Secondary | ICD-10-CM

## 2020-01-29 DIAGNOSIS — F3289 Other specified depressive episodes: Secondary | ICD-10-CM | POA: Diagnosis not present

## 2020-01-29 MED ORDER — LEVOTHYROXINE SODIUM 75 MCG PO TABS: 75.0000 ug | ORAL_TABLET | Freq: Every day | ORAL | 0 refills | Status: DC

## 2020-01-29 MED ORDER — VITAMIN D (ERGOCALCIFEROL) 1.25 MG (50000 UNIT) PO CAPS: 50000.0000 [IU] | ORAL_CAPSULE | ORAL | 0 refills | Status: DC

## 2020-01-29 NOTE — Progress Notes (Signed)
Chief Complaint:   OBESITY Kathryn Munoz is here to discuss her progress with her obesity treatment plan along with follow-up of her obesity related diagnoses. Kathryn Munoz is on the Category 3 Plan and states she is following her eating plan approximately 75% of the time. Kathryn Munoz states she is exercising 0 minutes 0 times per week.  Today's visit was #: 11 Starting weight: 252 lbs Starting date: 07/17/2019 Today's weight: 228 lbs Today's date: 01/29/2020 Total lbs lost to date: 24 Total lbs lost since last in-office visit: 0  Interim History: Kathryn Munoz's weight remains the same. She is dealing with stressful issues. She has stopped eating sweets and reports doing well with her water intake.  Subjective:   Other specified hypothyroidism. Kathryn Munoz endorses minimal fatigue.   Lab Results  Component Value Date   TSH 2.210 07/17/2019   Vitamin D deficiency. No nausea, vomiting, or muscle weakness.    Ref. Range 01/07/2020 08:07  Vitamin D, 25-Hydroxy Latest Ref Range: 30.0 - 100.0 ng/mL 47.4   Other depression, with emotional eating. Kathryn Munoz is struggling with emotional eating and using food for comfort to the extent that it is negatively impacting her health. She has been working on behavior modification techniques to help reduce her emotional eating and has been somewhat successful. She shows no sign of suicidal or homicidal ideations. Kathryn Munoz is taking medications per PCP.  At risk for activity intolerance. Kathryn Munoz is at risk of exercise intolerance due to fatigue and hypothyroidism.  Assessment/Plan:   Other specified hypothyroidism. Patient with long-standing hypothyroidism, on levothyroxine therapy. She appears euthyroid. Orders and follow up as documented in patient record. Prescription was given for levothyroxine (SYNTHROID) 75 MCG tablet 1 PO daily with breakfast.  Counseling . Good thyroid control is important for overall health. Supratherapeutic thyroid levels are  dangerous and will not improve weight loss results. . The correct way to take levothyroxine is fasting, with water, separated by at least 30 minutes from breakfast, and separated by more than 4 hours from calcium, iron, multivitamins, acid reflux medications (PPIs).   Vitamin D deficiency. Low Vitamin D level contributes to fatigue and are associated with obesity, breast, and colon cancer. She was given a prescription for Vitamin D, Ergocalciferol, (DRISDOL) 1.25 MG (50000 UNIT) CAPS capsule every week #4 with 0 refills and will follow-up for routine testing of Vitamin D, at least 2-3 times per year to avoid over-replacement.   Other depression, with emotional eating. Behavior modification techniques were discussed today to help Sonda deal with her emotional/non-hunger eating behaviors.  Orders and follow up as documented in patient record. We discussed emotional eating strategies and handouts were given.  At risk for activity intolerance. Kathryn Munoz was given approximately 15 minutes of exercise intolerance counseling today. She is 44 y.o. female and has risk factors exercise intolerance including obesity. We discussed intensive lifestyle modifications today with an emphasis on specific weight loss instructions and strategies. Kathryn Munoz will slowly increase activity as tolerated.  Repetitive spaced learning was employed today to elicit superior memory formation and behavioral change.  Class 2 severe obesity with serious comorbidity and body mass index (BMI) of 38.0 to 38.9 in adult, unspecified obesity type (HCC).  Kathryn Munoz is currently in the action stage of change. As such, her goal is to continue with weight loss efforts. She has agreed to the Category 3 Plan.   She will work on meal planning, intentional eating, and increasing her protein intake.   We reviewed with the patient labs including  Vitamin D level of 47.8, A1c, and insulin.  Exercise goals: Kathryn Munoz will use elliptical during the cold  weather and will join the gym.  Behavioral modification strategies: increasing lean protein intake, decreasing simple carbohydrates, increasing vegetables, increasing water intake, decreasing eating out, no skipping meals, meal planning and cooking strategies, keeping healthy foods in the home, ways to avoid boredom eating, celebration eating strategies and planning for success.  Kathryn Munoz has agreed to follow-up with our clinic in 2-3 weeks. She was informed of the importance of frequent follow-up visits to maximize her success with intensive lifestyle modifications for her multiple health conditions.   Objective:   Pulse 74, temperature 98.2 F (36.8 C), height 5\' 5"  (1.651 m), weight 228 lb (103.4 kg), SpO2 96 %. Body mass index is 37.94 kg/m.  General: Cooperative, alert, well developed, in no acute distress. HEENT: Conjunctivae and lids unremarkable. Cardiovascular: Regular rhythm.  Lungs: Normal work of breathing. Neurologic: No focal deficits.   Lab Results  Component Value Date   CREATININE 0.97 10/14/2019   BUN 9 10/14/2019   NA 139 10/14/2019   K 4.6 10/14/2019   CL 104 10/14/2019   CO2 22 10/14/2019   Lab Results  Component Value Date   ALT 13 10/14/2019   AST 16 10/14/2019   ALKPHOS 65 10/14/2019   BILITOT 0.4 10/14/2019   Lab Results  Component Value Date   HGBA1C 5.2 01/07/2020   HGBA1C 5.2 10/14/2019   HGBA1C 5.5 07/17/2019   HGBA1C 5.7 (H) 03/31/2019   HGBA1C 4.8 05/09/2018   Lab Results  Component Value Date   INSULIN 29.0 (H) 01/07/2020   INSULIN 32.5 (H) 10/14/2019   INSULIN 19.6 07/17/2019   Lab Results  Component Value Date   TSH 2.210 07/17/2019   Lab Results  Component Value Date   CHOL 186 10/14/2019   HDL 55 10/14/2019   LDLCALC 113 (H) 10/14/2019   TRIG 102 10/14/2019   CHOLHDL 3.7 03/31/2019   Lab Results  Component Value Date   WBC 8.0 07/17/2019   HGB 12.6 07/17/2019   HCT 38.6 07/17/2019   MCV 98 (H) 07/17/2019   PLT 319  07/17/2019   No results found for: IRON, TIBC, FERRITIN  Attestation Statements:   Reviewed by clinician on day of visit: allergies, medications, problem list, medical history, surgical history, family history, social history, and previous encounter notes.  07/19/2019, am acting as Fernanda Drum for Energy manager, DO   I have reviewed the above documentation for accuracy and completeness, and I agree with the above. [employer], DO

## 2020-02-02 ENCOUNTER — Encounter (INDEPENDENT_AMBULATORY_CARE_PROVIDER_SITE_OTHER): Payer: Self-pay | Admitting: Bariatrics

## 2020-02-11 ENCOUNTER — Ambulatory Visit
Admission: RE | Admit: 2020-02-11 | Discharge: 2020-02-11 | Disposition: A | Discharge status: Home / Self Care | Payer: BC Managed Care – PPO | Source: Ambulatory Visit | Attending: Family Medicine | Admitting: Family Medicine

## 2020-02-11 ENCOUNTER — Other Ambulatory Visit: Payer: Self-pay

## 2020-02-11 DIAGNOSIS — Z1231 Encounter for screening mammogram for malignant neoplasm of breast: Secondary | ICD-10-CM | POA: Insufficient documentation

## 2020-02-17 ENCOUNTER — Telehealth: Payer: BC Managed Care – PPO | Admitting: Psychiatry

## 2020-02-17 ENCOUNTER — Encounter (INDEPENDENT_AMBULATORY_CARE_PROVIDER_SITE_OTHER): Payer: Self-pay | Admitting: Bariatrics

## 2020-02-17 ENCOUNTER — Other Ambulatory Visit: Payer: Self-pay

## 2020-02-17 ENCOUNTER — Ambulatory Visit (INDEPENDENT_AMBULATORY_CARE_PROVIDER_SITE_OTHER): Payer: BC Managed Care – PPO | Admitting: Bariatrics

## 2020-02-17 VITALS — BP 122/79 | HR 99 | Temp 98.4°F | Ht 65.0 in | Wt 232.0 lb

## 2020-02-17 DIAGNOSIS — Z9189 Other specified personal risk factors, not elsewhere classified: Secondary | ICD-10-CM | POA: Diagnosis not present

## 2020-02-17 DIAGNOSIS — Z6838 Body mass index (BMI) 38.0-38.9, adult: Secondary | ICD-10-CM

## 2020-02-17 DIAGNOSIS — E8881 Metabolic syndrome: Secondary | ICD-10-CM | POA: Diagnosis not present

## 2020-02-17 DIAGNOSIS — E559 Vitamin D deficiency, unspecified: Secondary | ICD-10-CM

## 2020-02-17 MED ORDER — VITAMIN D (ERGOCALCIFEROL) 1.25 MG (50000 UNIT) PO CAPS: 50000.0000 [IU] | ORAL_CAPSULE | ORAL | 0 refills | Status: DC

## 2020-02-17 NOTE — Progress Notes (Signed)
Chief Complaint:   OBESITY Kathryn Munoz is here to discuss her progress with her obesity treatment plan along with follow-up of her obesity related diagnoses. Kathryn Munoz is on the Category 3 Plan and states she is following her eating plan approximately 10% of the time. Kathryn Munoz states she is exercising 0 minutes 0 times per week.  Today's visit was #: 12 Starting weight: 252 lbs Starting date: 07/17/2019 Today's weight: 232 lbs Today's date: 02/17/2020 Total lbs lost to date: 20 Total lbs lost since last in-office visit: 0  Interim History: Kathryn Munoz is up 4 lbs, but doing well. She reports doing well with her water intake.  Subjective:   Insulin resistance. Kathryn Munoz has a diagnosis of insulin resistance based on her elevated fasting insulin level >5. She continues to work on diet and exercise to decrease her risk of diabetes. Kathryn Munoz is taking metformin.  Lab Results  Component Value Date   INSULIN 29.0 (H) 01/07/2020   INSULIN 32.5 (H) 10/14/2019   INSULIN 19.6 07/17/2019   Lab Results  Component Value Date   HGBA1C 5.2 01/07/2020   Vitamin D deficiency. No nausea, vomiting, or muscle weakness.    Ref. Range 01/07/2020 08:07  Vitamin D, 25-Hydroxy Latest Ref Range: 30.0 - 100.0 ng/mL 47.4   At risk for hypoglycemia. Kathryn Munoz is at increased risk for hypoglycemia due to insulin resistance.  Assessment/Plan:   Insulin resistance. Kathryn Munoz will continue to work on weight loss, exercise, and decreasing simple carbohydrates to help decrease the risk of diabetes. Kathryn Munoz agreed to follow-up with [place] as directed to closely monitor her progress. She will continue metformin as directed.   Vitamin D deficiency. Low Vitamin D level contributes to fatigue and are associated with obesity, breast, and colon cancer. She was given a prescription for Vitamin D, Ergocalciferol, (DRISDOL) 1.25 MG (50000 UNIT) CAPS capsule every week #4 with 0 refills and will follow-up for routine testing of  Vitamin D, at least 2-3 times per year to avoid over-replacement.   At risk for hypoglycemia. Kathryn Munoz was given approximately 15 minutes of counseling today regarding prevention of hypoglycemia. She was advised of symptoms of hypoglycemia. Trevia was instructed to avoid skipping meals, eat regular protein rich meals and schedule low calorie snacks as needed.   Repetitive spaced learning was employed today to elicit superior memory formation and behavioral change.   Class 2 severe obesity with serious comorbidity and body mass index (BMI) of 38.0 to 38.9 in adult, unspecified obesity type (HCC).  Kathryn Munoz is currently in the action stage of change. As such, her goal is to continue with weight loss efforts. She has agreed to the Category 3 Plan.   She will work on meal planning, mindful eating, and increasing her protein intake.  Exercise goals: Kathryn Munoz will be going to [employer] to exercise.  Behavioral modification strategies: increasing lean protein intake, decreasing simple carbohydrates, increasing vegetables, increasing water intake, decreasing eating out, no skipping meals, meal planning and cooking strategies, keeping healthy foods in the home and planning for success.  Kathryn Munoz has agreed to follow-up with our clinic in 2-3 weeks. She was informed of the importance of frequent follow-up visits to maximize her success with intensive lifestyle modifications for her multiple health conditions.   Objective:   Blood pressure 122/79, pulse 99, temperature 98.4 F (36.9 C), height 5\' 5"  (1.651 m), weight 232 lb (105.2 kg), SpO2 97 %. Body mass index is 38.61 kg/m.  General: Cooperative, alert, well developed, in no acute  distress. HEENT: Conjunctivae and lids unremarkable. Cardiovascular: Regular rhythm.  Lungs: Normal work of breathing. Neurologic: No focal deficits.   Lab Results  Component Value Date   CREATININE 0.97 10/14/2019   BUN 9 10/14/2019   NA 139 10/14/2019   K  4.6 10/14/2019   CL 104 10/14/2019   CO2 22 10/14/2019   Lab Results  Component Value Date   ALT 13 10/14/2019   AST 16 10/14/2019   ALKPHOS 65 10/14/2019   BILITOT 0.4 10/14/2019   Lab Results  Component Value Date   HGBA1C 5.2 01/07/2020   HGBA1C 5.2 10/14/2019   HGBA1C 5.5 07/17/2019   HGBA1C 5.7 (H) 03/31/2019   HGBA1C 4.8 05/09/2018   Lab Results  Component Value Date   INSULIN 29.0 (H) 01/07/2020   INSULIN 32.5 (H) 10/14/2019   INSULIN 19.6 07/17/2019   Lab Results  Component Value Date   TSH 2.210 07/17/2019   Lab Results  Component Value Date   CHOL 186 10/14/2019   HDL 55 10/14/2019   LDLCALC 113 (H) 10/14/2019   TRIG 102 10/14/2019   CHOLHDL 3.7 03/31/2019   Lab Results  Component Value Date   WBC 8.0 07/17/2019   HGB 12.6 07/17/2019   HCT 38.6 07/17/2019   MCV 98 (H) 07/17/2019   PLT 319 07/17/2019   No results found for: IRON, TIBC, FERRITIN  Attestation Statements:   Reviewed by clinician on day of visit: allergies, medications, problem list, medical history, surgical history, family history, social history, and previous encounter notes.  Fernanda Drum, am acting as Energy manager for [employer], DO   I have reviewed the above documentation for accuracy and completeness, and I agree with the above. Corinna Capra, DO

## 2020-02-28 ENCOUNTER — Other Ambulatory Visit (INDEPENDENT_AMBULATORY_CARE_PROVIDER_SITE_OTHER): Payer: Self-pay | Admitting: Bariatrics

## 2020-02-28 DIAGNOSIS — E038 Other specified hypothyroidism: Secondary | ICD-10-CM

## 2020-03-01 NOTE — Telephone Encounter (Signed)
Last OV with Dr Brown 

## 2020-03-03 ENCOUNTER — Other Ambulatory Visit: Payer: Self-pay | Admitting: Psychiatry

## 2020-03-03 DIAGNOSIS — F3181 Bipolar II disorder: Secondary | ICD-10-CM

## 2020-03-08 ENCOUNTER — Other Ambulatory Visit: Payer: Self-pay

## 2020-03-08 ENCOUNTER — Encounter: Payer: Self-pay | Admitting: Psychiatry

## 2020-03-08 ENCOUNTER — Telehealth (INDEPENDENT_AMBULATORY_CARE_PROVIDER_SITE_OTHER): Payer: BC Managed Care – PPO | Admitting: Psychiatry

## 2020-03-08 DIAGNOSIS — F5105 Insomnia due to other mental disorder: Secondary | ICD-10-CM | POA: Diagnosis not present

## 2020-03-08 DIAGNOSIS — F3131 Bipolar disorder, current episode depressed, mild: Secondary | ICD-10-CM | POA: Diagnosis not present

## 2020-03-08 DIAGNOSIS — F429 Obsessive-compulsive disorder, unspecified: Secondary | ICD-10-CM

## 2020-03-08 MED ORDER — CITALOPRAM HYDROBROMIDE 40 MG PO TABS: 40.0000 mg | ORAL_TABLET | Freq: Every day | ORAL | 1 refills | Status: DC

## 2020-03-08 MED ORDER — LAMOTRIGINE 200 MG PO TABS: 100.0000 mg | ORAL_TABLET | Freq: Two times a day (BID) | ORAL | 0 refills | Status: DC

## 2020-03-08 NOTE — Progress Notes (Signed)
This note is not being shared with the patient for the following reason: To prevent harm (release of this note would result in harm to the life or physical safety of the patient or another).   Virtual Visit via Telephone Note  I connected with Kathryn Munoz on 03/08/20 at  1:30 PM EST by telephone and verified that I am speaking with the correct person using two identifiers.  Location Provider Location : [facility] Patient Location : Work  Participants: Patient , Provider    I discussed the limitations, risks, security and privacy concerns of performing an evaluation and management service by telephone and the availability of in person appointments. I also discussed with the patient that there may be a patient responsible charge related to this service. The patient expressed understanding and agreed to proceed.   I discussed the assessment and treatment plan with the patient. The patient was provided an opportunity to ask questions and all were answered. The patient agreed with the plan and demonstrated an understanding of the instructions.   The patient was advised to call back or seek an in-person evaluation if the symptoms worsen or if the condition fails to improve as anticipated.   BH MD OP Progress Note  03/08/2020 1:59 PM Kathryn Munoz  MRN:  [id number]  Chief Complaint:  Chief Complaint    Follow-up     HPI: Kathryn Munoz is a 44 year old African-American female, lives in [place], has a history of bipolar disorder, insomnia, OCD, insulin resistance, hypothyroidism, other specified eating disorder was evaluated by telemedicine today.  Patient today reports she has a lot of psychosocial stressors.  She reports the holidays are bringing back a lot of memories of her mother who passed away, she is currently struggling with relationship struggles with her husband, she is also struggling with body image problems.  She reports she had recent suicidal thoughts and almost had a plan.   She took out her bottle of pills however did not open it and was able to distract herself and cope with her thoughts.  She reports she has a support system, has a friend who she can contact if she has suicidal thought or worsening mood symptoms.  She agrees to get in touch with her friend if she needs to.  She is currently looking for a therapist and has not been successful finding one.  Patient  denies any suicidality at this time.  Patient reports she is interested in dosage increase of her Lamictal.  Denies any side effects.  She is compliant on the Seroquel.  She reports some twitching of her cheeks in the past which happened only once.  It has not happened again.  She will monitor herself.  Patient reports sleep and appetite as fair.  Patient denies any other concerns today.  Visit Diagnosis:    ICD-10-CM   1. Bipolar 1 disorder, depressed, mild (HCC)  F31.31 lamoTRIgine (LAMICTAL) 200 MG tablet  2. Insomnia due to mental condition  F51.05    depression  3. 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  Past trials of Celexa.  Patient reports past history of suicide attempt at the age of 44 when she overdosed on pills, was admitted to the hospital.  She had another episode of overdose on NyQuil at the age of 44 however she just slept and did not  tell anyone.  Past Medical History:  Past Medical History:  Diagnosis Date  . Abnormal mammogram    breast biopsy, [facility] 2014  . ADD (attention deficit disorder)   . Anxiety   . Bipolar 2 disorder (HCC)   . Constipation   . Depression   . Dermatitis   . Heavy menstrual bleeding   . History of gestational diabetes   . History of kidney stones 2014  . Hypothyroidism   . IFG (impaired fasting glucose)   . Insulin resistance   . Irregular periods   . Joint pain   . Multiple food  allergies    Dates and nuts  . Neuropathy   . Obesity   . OCD (obsessive compulsive disorder)   . Prediabetes   . Vitamin D deficiency disease     Past Surgical History:  Procedure Laterality Date  . BREAST BIOPSY Left 2014   [place] bx/clip-neg  . INTRAUTERINE DEVICE INSERTION  03/11/2012    Family Psychiatric History: I have reviewed family psychiatric history from my progress note on 11/08/2017  Family History:  Family History  Problem Relation Age of Onset  . Diabetes Mother   . Pancreatitis Mother   . Hypertension Mother   . Alcohol abuse Mother   . Schizophrenia Mother   . Depression Mother   . Heart disease Mother   . Stroke Mother   . Thyroid disease Mother   . Kidney disease Mother   . Anxiety disorder Mother   . Alcohol abuse Father   . Diabetes Sister        pre-diabetic  . Diabetes Brother   . Drug abuse Brother   . ADD / ADHD Son   . Eczema Son   . Breast cancer Cousin        pat cousin  . Anxiety disorder Other   . Ovarian cancer Neg Hx     Social History: Reviewed social history from my progress note on 11/08/2017 Social History   Socioeconomic History  . Marital status: Married    Spouse name: Mry Lamia  . Number of children: 2  . Years of education: Not on file  . Highest education level: Bachelor's degree (e.g., BA, AB, BS)  Occupational History  . Occupation: Charity fundraiser  Tobacco Use  . Smoking status: Never Smoker  . Smokeless tobacco: Never Used  Vaping Use  . Vaping Use: Never used  Substance and Sexual Activity  . Alcohol use: No  . Drug use: No  . Sexual activity: Yes    Partners: Male    Birth control/protection: Other-see comments    Comment: nuvaring  Other Topics Concern  . Not on file  Social History Narrative  . Not on file   Social Determinants of Health   Financial Resource Strain: Not on file  Food Insecurity: Not on file  Transportation Needs: Not on file  Physical Activity: Not on file  Stress: Not on file   Social Connections: Not on file    Allergies:  Allergies  Allergen Reactions  . Date Seed Extract  [Zizyphus Jujuba] Anaphylaxis, Cough, Itching and Swelling  . Other Swelling    Metabolic Disorder Labs: Lab Results  Component Value Date   HGBA1C 5.2 01/07/2020   MPG 117 03/31/2019   MPG 91 05/09/2018   No results found for: PROLACTIN Lab Results  Component Value Date   CHOL 186 10/14/2019   TRIG 102 10/14/2019   HDL 55 10/14/2019   CHOLHDL 3.7 03/31/2019   VLDL 14  10/16/2016   LDLCALC 113 (H) 10/14/2019   LDLCALC 129 (H) 07/17/2019   Lab Results  Component Value Date   TSH 2.210 07/17/2019   TSH 8.40 (H) 03/31/2019    Therapeutic Level Labs: No results found for: LITHIUM No results found for: VALPROATE No components found for:  CBMZ  Current Medications: Current Outpatient Medications  Medication Sig Dispense Refill  . citalopram (CELEXA) 40 MG tablet Take 1 tablet (40 mg total) by mouth daily. 90 tablet 1  . EPINEPHrine 0.3 mg/0.3 mL IJ SOAJ injection INJECT 0.3 MLS INTO THE MUSCLE ONCE FOR 1 DOSE. FOR LIFE-THREATENING ALLERGIC REACTION / ANAPHYLAXIS  1  . etonogestrel-ethinyl estradiol (NUVARING) 0.12-0.015 MG/24HR vaginal ring INSERT 1 RING VAGINALLY AS DIRECTED. REMOVE AFTER 3 WEEKS & WAIT 7 DAYS BEFORE INSERTING A NEW RING 3 each 3  . lamoTRIgine (LAMICTAL) 200 MG tablet Take 0.5 tablets (100 mg total) by mouth 2 (two) times daily. 90 tablet 0  . levothyroxine (SYNTHROID) 75 MCG tablet Take 1 tablet (75 mcg total) by mouth daily before breakfast. 30 tablet 0  . metFORMIN (GLUCOPHAGE-XR) 500 MG 24 hr tablet TAKE 3 TABLETS (1,500 MG TOTAL) BY MOUTH DAILY. 270 tablet 3  . polyethylene glycol (MIRALAX / GLYCOLAX) packet Take 17 g by mouth as needed.     Marland Kitchen QUEtiapine (SEROQUEL) 25 MG tablet TAKE 3 TABLETS (75 MG TOTAL) BY MOUTH AT BEDTIME. 270 tablet 1  . traZODone (DESYREL) 50 MG tablet TAKE 1 AND 1/2 TO 2 TABLETS (75-100 MG TOTAL) BY MOUTH AT BEDTIME AS NEEDED  FOR SLEEP. FOR SLEEP 180 tablet 1  . Vitamin D, Ergocalciferol, (DRISDOL) 1.25 MG (50000 UNIT) CAPS capsule Take 1 capsule (50,000 Units total) by mouth every 7 (seven) days. 4 capsule 0   No current facility-administered medications for this visit.     Musculoskeletal: Strength & Muscle Tone: UTA Gait & Station: UTA Patient leans: N/A  Psychiatric Specialty Exam: Review of Systems  Psychiatric/Behavioral: Positive for dysphoric mood.  All other systems reviewed and are negative.   There were no vitals taken for this visit.There is no height or weight on file to calculate BMI.  General Appearance: UTA  Eye Contact:  UTA  Speech:  Clear and Coherent  Volume:  Normal  Mood:  Dysphoric  Affect:  UTA  Thought Process:  Goal Directed and Descriptions of Associations: Intact  Orientation:  Full (Time, Place, and Person)  Thought Content: Logical   Suicidal Thoughts:  No had recent SI with plan , currently denies  Homicidal Thoughts:  No  Memory:  Immediate;   Fair Recent;   Fair Remote;   Fair  Judgement:  Fair  Insight:  Fair  Psychomotor Activity:  UTA  Concentration:  Concentration: Fair and Attention Span: Fair  Recall:  [name] of Knowledge: Fair  Language: Fair  Akathisia:  No  Handed:  Right  AIMS (if indicated): UTA  Assets:  Communication Skills Desire for Improvement Housing Intimacy Social Support Talents/Skills Transportation Vocational/Educational  ADL's:  Intact  Cognition: WNL  Sleep:  Fair   Screenings: Optometrist Office Visit from 11/11/2019 in [facility] Office Visit from 07/17/2019 in [facility] Office Visit from 03/24/2019 in [facility] Office Visit from 11/08/2018 in [facility] Nutrition from 08/26/2018 in [facility] [place]  PHQ-2 Total Score 0 6 1 0 2  PHQ-9 Total Score 1 19 5  0 9  Assessment and Plan: JULL HARRAL is a  44 year old African-American female, married, employed, lives in [place], has a history of bipolar disorder, sleep problems, OCD, hypothyroidism was evaluated by telemedicine today.  Patient is biologically predisposed given her history of mental health problems in her family as well as history of trauma.  Patient with recent psychosocial stressors of situational stressors as noted above.  She will benefit from medication readjustment and psychotherapy sessions.  Plan as noted below. Risk factors for suicide-recent suicidal thoughts with plan, depressive symptoms, mental health problems, past history of suicide attempt Protective factors are-she is married, has a child who she is responsible for, has good social support system, is employed, currently denies any suicidal thoughts, denies family history of suicide, denies access to firearms.  She is motivated to start therapy and also continue treatment. Acute risk for suicide is low.  Plan Bipolar disorder-unstable Increase Lamictal to 200 mg p.o. daily Celexa 40 mg p.o. daily Seroquel as prescribed  OCD-stable Celexa 40 mg p.o. daily  Insomnia-improving Seroquel 75 mg p.o. nightly Trazodone 50 mg p.o. nightly as needed. Patient to monitor for side effects of Seroquel.  Will refer patient for CBT.  Follow-up in clinic in 4 weeks or sooner if needed.  I have spent atleast 20 minutes non face to face  with patient today. More than 50 % of the time was spent for preparing to see the patient ( e.g., review of test, records ),  ordering medications and test ,psychoeducation and supportive psychotherapy and care coordination,as well as documenting clinical information in electronic health record. This note was generated in part or whole with voice recognition software. Voice recognition is usually quite accurate but there are transcription errors that can and very often do occur. I apologize for any typographical errors that were not detected and  corrected.      Jomarie Longs, MD 03/09/2020, 9:18 AM

## 2020-03-09 ENCOUNTER — Ambulatory Visit (INDEPENDENT_AMBULATORY_CARE_PROVIDER_SITE_OTHER): Payer: BC Managed Care – PPO | Admitting: Licensed Clinical Social Worker

## 2020-03-09 DIAGNOSIS — F3131 Bipolar disorder, current episode depressed, mild: Secondary | ICD-10-CM

## 2020-03-09 NOTE — Progress Notes (Signed)
Virtual Visit via Video Note  I connected with Kathryn Munoz on 03/09/20 at  2:30 PM EST by a video enabled telemedicine application and verified that I am speaking with the correct person using two identifiers.  Location: Patient: home Provider: [facility]   I discussed the limitations of evaluation and management by telemedicine and the availability of in person appointments. The patient expressed understanding and agreed to proceed.  I discussed the assessment and treatment plan with the patient. The patient was provided an opportunity to ask questions and all were answered. The patient agreed with the plan and demonstrated an understanding of the instructions.   The patient was advised to call back or seek an in-person evaluation if the symptoms worsen or if the condition fails to improve as anticipated.  I provided 45 minutes of non-face-to-face time during this encounter.   Kathryn Navarrete R Zeferino Mounts, LCSW    THERAPIST PROGRESS NOTE  Session Time: 2:30-3:30p (lost 15 min due to [facility] power outage)  Participation Level: Active  Behavioral Response: Neat and Well GroomedAlertAnxious and Depressed  Type of Therapy: Individual Therapy  Treatment Goals addressed: Anxiety and Coping  Interventions: Supportive  Summary: Kathryn Munoz is a 44 y.o. female who presents with symptoms consistent with bipolar disorder. Pt reports that currently mood is stable and that she is managing stress and anxiety well. Pt reports that she is getting good quality and quantity of sleep.  Pt reports that she had a recent suicidal ideation event that triggered fear and pt knew that she needed to reach out for additional support.   Falecia reports that she has had suicidal thoughts and attempts before in the past at age 40 and 89. Pt reports that she was hospitalized and stabilized.   Pt has been consistent with therapy throughout the years so has some great coping skills. Pt has done CBT therapy with  psychologist. Pt currently in weight management program and has lost 30 lbs. Husband recently had gastric bypass.   Reviewed coping skills: what is working well and what isn't working well. Pt reports that reframing was something that she found helpful and tries to use routinely. Pt also uses mindfulness skills regularly.   Encouraged continuing priority of self care, positive social engagement, physical activity, and overall life balance.   Suicidal/Homicidal: No.  Pt reports incident in the past with SI with plan to OD on medication--pt denies any current SI, HI, or AVH.  Therapist Response: Reviewed previous counseling sessions and developed goals/treatment   Plan: Return again in 4 weeks. Suggested IOP but pt feels that she doesn't need at this point in time.  Diagnosis: Axis I: Bipolar, Depressed    Axis II: No diagnosis    Ernest Haber Emberlyn Burlison, LCSW 03/09/2020

## 2020-03-10 ENCOUNTER — Other Ambulatory Visit: Payer: Self-pay

## 2020-03-10 ENCOUNTER — Ambulatory Visit (INDEPENDENT_AMBULATORY_CARE_PROVIDER_SITE_OTHER): Payer: BC Managed Care – PPO | Admitting: Bariatrics

## 2020-03-10 ENCOUNTER — Telehealth (INDEPENDENT_AMBULATORY_CARE_PROVIDER_SITE_OTHER): Payer: Self-pay

## 2020-03-10 ENCOUNTER — Encounter (INDEPENDENT_AMBULATORY_CARE_PROVIDER_SITE_OTHER): Payer: Self-pay | Admitting: Bariatrics

## 2020-03-10 VITALS — BP 117/77 | HR 75 | Temp 98.5°F | Ht 65.0 in | Wt 237.0 lb

## 2020-03-10 DIAGNOSIS — E038 Other specified hypothyroidism: Secondary | ICD-10-CM

## 2020-03-10 DIAGNOSIS — E559 Vitamin D deficiency, unspecified: Secondary | ICD-10-CM

## 2020-03-10 DIAGNOSIS — Z9189 Other specified personal risk factors, not elsewhere classified: Secondary | ICD-10-CM

## 2020-03-10 DIAGNOSIS — Z6839 Body mass index (BMI) 39.0-39.9, adult: Secondary | ICD-10-CM

## 2020-03-10 MED ORDER — INSULIN PEN NEEDLE 32G X 6 MM MISC: 0 refills | Status: DC

## 2020-03-10 MED ORDER — SAXENDA 18 MG/3ML ~~LOC~~ SOPN: 0.6000 mg | PEN_INJECTOR | Freq: Every day | SUBCUTANEOUS | 0 refills | Status: DC

## 2020-03-10 MED ORDER — VITAMIN D (ERGOCALCIFEROL) 1.25 MG (50000 UNIT) PO CAPS: 50000.0000 [IU] | ORAL_CAPSULE | ORAL | 0 refills | Status: DC

## 2020-03-10 MED ORDER — LEVOTHYROXINE SODIUM 75 MCG PO TABS: 75.0000 ug | ORAL_TABLET | Freq: Every day | ORAL | 0 refills | Status: DC

## 2020-03-10 NOTE — Telephone Encounter (Signed)
PA has been initiated for Saxenda 18mg /21mL via 1m.  Aniella Bialy (Key: [email or web address]) Rx #: [id number] Saxenda 18MG [id number] pen-injectors   Form [employer] Electronic PA Form (2017 NCPDP) Created 6 hours ago Sent to Plan 4 minutes ago Plan Response 3 minutes ago Submit Clinical Questions less than a minute ago Determination Wait for Determination Please wait for [employer] NCPDP 2017 to return a determination.  Your information has been submitted to [employer]. To check for an updated outcome later, reopen this PA request from your dashboard.  If [employer] has not responded to your request within 24 hours, contact [employer] at [phone]. If you think there may be a problem with your PA request, use our live chat feature at the bottom right.

## 2020-03-10 NOTE — Progress Notes (Signed)
Chief Complaint:   OBESITY Kathryn Munoz is here to discuss her progress with her obesity treatment plan along with follow-up of her obesity related diagnoses. Bret is on the Category 3 Plan and states she is following her eating plan approximately 0% of the time. Kathryn Munoz states she is exercising 0 minutes 0 times per week.  Today's visit was #: 13 Starting weight: 252 lbs Starting date: 07/17/2019 Today's weight: 237 lbs Today's date: 03/10/2020 Total lbs lost to date: 15 Total lbs lost since last in-office visit: 0  Interim History: Kathryn Munoz is up 5 lbs since her last visit. She reports not getting that much protein.  Subjective:   Vitamin D deficiency. No nausea, vomiting, or muscle weakness.    Ref. Range 01/07/2020 08:07  Vitamin D, 25-Hydroxy Latest Ref Range: 30.0 - 100.0 ng/mL 47.4   Other specified hypothyroidism. Kahlea reports no problems with medications.   Lab Results  Component Value Date   TSH 2.210 07/17/2019   At risk for activity intolerance. [name] is at risk of exercise intolerance due to cold weather and fatigue.  Assessment/Plan:   Vitamin D deficiency. Low Vitamin D level contributes to fatigue and are associated with obesity, breast, and colon cancer. She was given a prescription for Vitamin D, Ergocalciferol, (DRISDOL) 1.25 MG (50000 UNIT) CAPS capsule every week #4 with 0 refills and will follow-up for routine testing of Vitamin D, at least 2-3 times per year to avoid over-replacement.   Other specified hypothyroidism. Patient with long-standing hypothyroidism, on levothyroxine therapy. She appears euthyroid. Orders and follow up as documented in patient record. Prescription was given for levothyroxine (SYNTHROID) 75 MCG tablet 1 daily before breakfast #30 with 0 refills.  Counseling . Good thyroid control is important for overall health. Supratherapeutic thyroid levels are dangerous and will not improve weight loss results. . The correct  way to take levothyroxine is fasting, with water, separated by at least 30 minutes from breakfast, and separated by more than 4 hours from calcium, iron, multivitamins, acid reflux medications (PPIs).   At risk for activity intolerance. Kathryn Munoz was given approximately 15 minutes of exercise intolerance counseling today. She is 44 y.o. female and has risk factors exercise intolerance including obesity. We discussed intensive lifestyle modifications today with an emphasis on specific weight loss instructions and strategies. Kathryn Munoz will slowly increase activity as tolerated.  Repetitive spaced learning was employed today to elicit superior memory formation and behavioral change.  Class 2 severe obesity with serious comorbidity and body mass index (BMI) of 39.0 to 39.9 in adult, unspecified obesity type (HCC). Prescriptions were given for Saxenda 0.6 mg daily, 1 month supply with 0 refills and needles 32 G 6 MM #50 with 0 refills.  Kathryn Munoz is currently in the action stage of change. As such, her goal is to continue with weight loss efforts. She has agreed to the Category 3 Plan.   She will work on meal planning, intentional eating, and being more adherent to the plan.  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  Behavioral modification strategies: increasing lean protein intake, decreasing simple carbohydrates, increasing vegetables, increasing water intake, decreasing eating out, no skipping meals, meal planning and cooking strategies, keeping healthy foods in the home, dealing with family or coworker sabotage, travel eating strategies, holiday eating strategies , celebration eating strategies, avoiding temptations and planning for success.  Kathryn Munoz has agreed to follow-up with our clinic in 3-4 weeks. She was informed of the importance of frequent  follow-up visits to maximize her success with intensive lifestyle modifications for her multiple health conditions.   Objective:    Blood pressure 117/77, pulse 75, temperature 98.5 F (36.9 C), temperature source Oral, height 5\' 5"  (1.651 m), weight 237 lb (107.5 kg), SpO2 96 %. Body mass index is 39.44 kg/m.  General: Cooperative, alert, well developed, in no acute distress. HEENT: Conjunctivae and lids unremarkable. Cardiovascular: Regular rhythm.  Lungs: Normal work of breathing. Neurologic: No focal deficits.   Lab Results  Component Value Date   CREATININE 0.97 10/14/2019   BUN 9 10/14/2019   NA 139 10/14/2019   K 4.6 10/14/2019   CL 104 10/14/2019   CO2 22 10/14/2019   Lab Results  Component Value Date   ALT 13 10/14/2019   AST 16 10/14/2019   ALKPHOS 65 10/14/2019   BILITOT 0.4 10/14/2019   Lab Results  Component Value Date   HGBA1C 5.2 01/07/2020   HGBA1C 5.2 10/14/2019   HGBA1C 5.5 07/17/2019   HGBA1C 5.7 (H) 03/31/2019   HGBA1C 4.8 05/09/2018   Lab Results  Component Value Date   INSULIN 29.0 (H) 01/07/2020   INSULIN 32.5 (H) 10/14/2019   INSULIN 19.6 07/17/2019   Lab Results  Component Value Date   TSH 2.210 07/17/2019   Lab Results  Component Value Date   CHOL 186 10/14/2019   HDL 55 10/14/2019   LDLCALC 113 (H) 10/14/2019   TRIG 102 10/14/2019   CHOLHDL 3.7 03/31/2019   Lab Results  Component Value Date   WBC 8.0 07/17/2019   HGB 12.6 07/17/2019   HCT 38.6 07/17/2019   MCV 98 (H) 07/17/2019   PLT 319 07/17/2019   No results found for: IRON, TIBC, FERRITIN  Attestation Statements:   Reviewed by clinician on day of visit: allergies, medications, problem list, medical history, surgical history, family history, social history, and previous encounter notes.  07/19/2019, am acting as Fernanda Drum for Energy manager, DO   I have reviewed the above documentation for accuracy and completeness, and I agree with the above. [employer], DO

## 2020-03-22 ENCOUNTER — Other Ambulatory Visit: Payer: Self-pay | Admitting: Psychiatry

## 2020-03-22 DIAGNOSIS — F3176 Bipolar disorder, in full remission, most recent episode depressed: Secondary | ICD-10-CM

## 2020-04-06 ENCOUNTER — Encounter (INDEPENDENT_AMBULATORY_CARE_PROVIDER_SITE_OTHER): Payer: Self-pay | Admitting: Bariatrics

## 2020-04-06 ENCOUNTER — Other Ambulatory Visit: Payer: Self-pay

## 2020-04-06 ENCOUNTER — Encounter: Payer: Self-pay | Admitting: Psychiatry

## 2020-04-06 ENCOUNTER — Telehealth (INDEPENDENT_AMBULATORY_CARE_PROVIDER_SITE_OTHER): Payer: BC Managed Care – PPO | Admitting: Psychiatry

## 2020-04-06 ENCOUNTER — Telehealth (INDEPENDENT_AMBULATORY_CARE_PROVIDER_SITE_OTHER): Payer: BC Managed Care – PPO | Admitting: Bariatrics

## 2020-04-06 DIAGNOSIS — F429 Obsessive-compulsive disorder, unspecified: Secondary | ICD-10-CM

## 2020-04-06 DIAGNOSIS — Z6839 Body mass index (BMI) 39.0-39.9, adult: Secondary | ICD-10-CM | POA: Diagnosis not present

## 2020-04-06 DIAGNOSIS — F3176 Bipolar disorder, in full remission, most recent episode depressed: Secondary | ICD-10-CM

## 2020-04-06 DIAGNOSIS — E8881 Metabolic syndrome: Secondary | ICD-10-CM | POA: Diagnosis not present

## 2020-04-06 DIAGNOSIS — E559 Vitamin D deficiency, unspecified: Secondary | ICD-10-CM

## 2020-04-06 DIAGNOSIS — F5105 Insomnia due to other mental disorder: Secondary | ICD-10-CM

## 2020-04-06 MED ORDER — VITAMIN D (ERGOCALCIFEROL) 1.25 MG (50000 UNIT) PO CAPS: 50000.0000 [IU] | ORAL_CAPSULE | ORAL | 0 refills | Status: DC

## 2020-04-06 NOTE — Progress Notes (Signed)
Virtual Visit via Video Note  I connected with Myra Rude on 04/06/20 at 11:20 AM EST by a video enabled telemedicine application and verified that I am speaking with the correct person using two identifiers.  Location Provider Location : [facility] Patient Location : Work  Participants: Patient , Provider   I discussed the limitations of evaluation and management by telemedicine and the availability of in person appointments. The patient expressed understanding and agreed to proceed.    I discussed the assessment and treatment plan with the patient. The patient was provided an opportunity to ask questions and all were answered. The patient agreed with the plan and demonstrated an understanding of the instructions.   The patient was advised to call back or seek an in-person evaluation if the symptoms worsen or if the condition fails to improve as anticipated.  Video connection was lost at less than 50% of the duration of the visit, at which time the remainder of the visit was completed through audio only   [facility] MD OP Progress Note  04/06/2020 2:24 PM MARSELLA SUMAN  MRN:  [id number]  Chief Complaint:  Chief Complaint    Follow-up     HPI: RYVER ZADROZNY is a 45 year old African-American female, lives in [place], has a history of bipolar disorder, insomnia, OCD, insulin resistance, hypothyroidism, other specified eating disorder was evaluated by telemedicine today.  Patient today reports she is currently making progress on the current medication regimen.  She reports her mood symptoms are stable.  She denies any significant depression or anxiety at this time.  She reports the Lamictal higher dosage as helpful.  She denies side effects.  She is compliant on her medications as prescribed.  She reports sleep as good, increasing the Lamictal helped with sleep also.  Patient reports work is going well.  She recently started Saxenda for weight loss and has lost a few pounds.  She is also  watching her diet.  Patient denies any suicidality, homicidality or perceptual disturbances.  Patient denies any other concerns today.  Visit Diagnosis:    ICD-10-CM   1. Bipolar disorder, in full remission, most recent episode depressed (HCC)  F31.76   2. Insomnia due to mental condition  F51.05    mood  3. 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  F42.9     Past Psychiatric History: I have reviewed past psychiatric history from my progress note on 11/08/2017.  Past trials of Celexa.  Patient reports past history of age of 5 when she overdosed on pills, was admitted to the hospital.  She had another episode of overdose on NyQuil at the age of 75 however slept through it and did not tell anyone.  Past Medical History:  Past Medical History:  Diagnosis Date  . Abnormal mammogram    breast biopsy, [facility] 2014  . ADD (attention deficit disorder)   . Anxiety   . Bipolar 2 disorder (HCC)   . Constipation   . Depression   . Dermatitis   . Heavy menstrual bleeding   . History of gestational diabetes   . History of kidney stones 2014  . Hypothyroidism   . IFG (impaired fasting glucose)   . Insulin resistance   . Irregular periods   . Joint pain   . Multiple food allergies    Dates and nuts  . Neuropathy   . Obesity   . OCD (obsessive compulsive disorder)   . Prediabetes   . Vitamin D deficiency disease  Past Surgical History:  Procedure Laterality Date  . BREAST BIOPSY Left 2014   us bx/clip-neg  . INTRAUTERIKoreaE DEVICE INSERTION  03/11/2012    Family Psychiatric History: I have reviewed family psychiatric history from my progress note on 11/08/2017  Family History:  Family History  Problem Relation Age of Onset  . Diabetes Mother   . Pancreatitis Mother   . Hypertension Mother   . Alcohol abuse Mother   . Schizophrenia Mother   . Depression Mother   . Heart disease Mother   . Stroke Mother   . Thyroid disease Mother   . Kidney disease Mother   .  Anxiety disorder Mother   . Alcohol abuse Father   . Diabetes Sister        pre-diabetic  . Diabetes Brother   . Drug abuse Brother   . ADD / ADHD Son   . Eczema Son   . Breast cancer Cousin        pat cousin  . Anxiety disorder Other   . Ovarian cancer Neg Hx     Social History: Reviewed social history from my progress note on 11/08/2017 Social History   Socioeconomic History  . Marital status: Married    Spouse name: Lily KocherJeffrey Machia  . Number of children: 2  . Years of education: Not on file  . Highest education level: Bachelor's degree (e.g., BA, AB, BS)  Occupational History  . Occupation: Charity fundraiserChemist  Tobacco Use  . Smoking status: Never Smoker  . Smokeless tobacco: Never Used  Vaping Use  . Vaping Use: Never used  Substance and Sexual Activity  . Alcohol use: No  . Drug use: No  . Sexual activity: Yes    Partners: Male    Birth control/protection: Other-see comments    Comment: nuvaring  Other Topics Concern  . Not on file  Social History Narrative  . Not on file   Social Determinants of Health   Financial Resource Strain: Not on file  Food Insecurity: Not on file  Transportation Needs: Not on file  Physical Activity: Not on file  Stress: Not on file  Social Connections: Not on file    Allergies:  Allergies  Allergen Reactions  . Date Seed Extract  [Zizyphus Jujuba] Anaphylaxis, Cough, Itching and Swelling  . Other Swelling    Metabolic Disorder Labs: Lab Results  Component Value Date   HGBA1C 5.2 01/07/2020   MPG 117 03/31/2019   MPG 91 05/09/2018   No results found for: PROLACTIN Lab Results  Component Value Date   CHOL 186 10/14/2019   TRIG 102 10/14/2019   HDL 55 10/14/2019   CHOLHDL 3.7 03/31/2019   VLDL 14 10/16/2016   LDLCALC 113 (H) 10/14/2019   LDLCALC 129 (H) 07/17/2019   Lab Results  Component Value Date   TSH 2.210 07/17/2019   TSH 8.40 (H) 03/31/2019    Therapeutic Level Labs: No results found for: LITHIUM No results  found for: VALPROATE No components found for:  CBMZ  Current Medications: Current Outpatient Medications  Medication Sig Dispense Refill  . citalopram (CELEXA) 40 MG tablet Take 1 tablet (40 mg total) by mouth daily. 90 tablet 1  . EPINEPHrine 0.3 mg/0.3 mL IJ SOAJ injection INJECT 0.3 MLS INTO THE MUSCLE ONCE FOR 1 DOSE. FOR LIFE-THREATENING ALLERGIC REACTION / ANAPHYLAXIS  1  . etonogestrel-ethinyl estradiol (NUVARING) 0.12-0.015 MG/24HR vaginal ring INSERT 1 RING VAGINALLY AS DIRECTED. REMOVE AFTER 3 WEEKS & WAIT 7 DAYS BEFORE INSERTING A NEW RING 3  each 3  . Insulin Pen Needle 32G X 6 MM MISC Will use with Saxenda daily 50 each 0  . lamoTRIgine (LAMICTAL) 200 MG tablet Take 0.5 tablets (100 mg total) by mouth 2 (two) times daily. 90 tablet 0  . levothyroxine (SYNTHROID) 75 MCG tablet Take 1 tablet (75 mcg total) by mouth daily before breakfast. 30 tablet 0  . Liraglutide -Weight Management (SAXENDA) 18 MG/3ML SOPN Inject 0.6 mg into the skin daily. 6 mL 0  . metFORMIN (GLUCOPHAGE-XR) 500 MG 24 hr tablet TAKE 3 TABLETS (1,500 MG TOTAL) BY MOUTH DAILY. 270 tablet 3  . polyethylene glycol (MIRALAX / GLYCOLAX) packet Take 17 g by mouth as needed.     Marland Kitchen QUEtiapine (SEROQUEL) 25 MG tablet TAKE 3 TABLETS (75 MG TOTAL) BY MOUTH AT BEDTIME. 270 tablet 1  . traZODone (DESYREL) 50 MG tablet TAKE 1 AND 1/2 TO 2 TABLETS (75-100 MG TOTAL) BY MOUTH AT BEDTIME AS NEEDED FOR SLEEP. FOR SLEEP 180 tablet 1  . Vitamin D, Ergocalciferol, (DRISDOL) 1.25 MG (50000 UNIT) CAPS capsule Take 1 capsule (50,000 Units total) by mouth every 7 (seven) days. 4 capsule 0   No current facility-administered medications for this visit.     Musculoskeletal: Strength & Muscle Tone: UTA Gait & Station: UTA Patient leans: N/A  Psychiatric Specialty Exam: Review of Systems  Psychiatric/Behavioral: Negative for agitation, behavioral problems, confusion, decreased concentration, dysphoric mood, hallucinations, self-injury,  sleep disturbance and suicidal ideas. The patient is not nervous/anxious and is not hyperactive.   All other systems reviewed and are negative.   There were no vitals taken for this visit.There is no height or weight on file to calculate BMI.  General Appearance: Casual  Eye Contact:  Fair  Speech:  Normal Rate  Volume:  Normal  Mood:  Euthymic  Affect:  Congruent  Thought Process:  Goal Directed and Descriptions of Associations: Intact  Orientation:  Full (Time, Place, and Person)  Thought Content: Logical   Suicidal Thoughts:  No  Homicidal Thoughts:  No  Memory:  Immediate;   Fair Recent;   Fair Remote;   Fair  Judgement:  Fair  Insight:  Fair  Psychomotor Activity:  Normal  Concentration:  Concentration: Fair and Attention Span: Fair  Recall:  [name] of Knowledge: Fair  Language: Fair  Akathisia:  No  Handed:  Right  AIMS (if indicated): UTA  Assets:  Communication Skills Desire for Improvement Housing Social Support Talents/Skills Transportation Vocational/Educational  ADL's:  Intact  Cognition: WNL  Sleep:  Improving   Screenings: PHQ2-9   Flowsheet Row Office Visit from 11/11/2019 in [facility] Office Visit from 07/17/2019 in [facility] Office Visit from 03/24/2019 in [facility] Office Visit from 11/08/2018 in [facility] from 08/26/2018 in [facility]   PHQ-2 Total Score 0 6 1 0 2  PHQ-9 Total Score 1 19 5  0 9       Assessment and Plan: LOVENE MARET is a 45 year old African-American female, married, employed, lives in [place], has a history of bipolar disorder, sleep problems, OCD, hypothyroidism was evaluated by telemedicine today.  She is biologically predisposed given her history of mental health problems in her family, history of trauma.  Patient is currently stable on medications.  Plan as noted below.  Plan Bipolar disorder-in remission Lamictal  200 mg p.o. daily Celexa 40 mg p.o. daily Seroquel as prescribed  OCD-stable Seroquel 75 mg p.o. nightly.  Will consider tapering it off in the future. Trazodone 50 mg p.o. nightly as needed  Patient was referred for CBT, continue psychotherapy sessions.  Follow-up in clinic in 4 weeks or sooner if needed.  I have spent atleast 20 minutes non face to face  with patient today. More than 50 % of the time was spent for preparing to see the patient ( e.g., review of test, records ), ordering medications and test ,psychoeducation and supportive psychotherapy and care coordination,as well as documenting clinical information in electronic health record. This note was generated in part or whole with voice recognition software. Voice recognition is usually quite accurate but there are transcription errors that can and very often do occur. I apologize for any typographical errors that were not detected and corrected.        Jomarie Longs, MD 04/06/2020, 2:24 PM

## 2020-04-07 NOTE — Progress Notes (Unsigned)
TeleHealth Visit:  Due to the COVID-19 pandemic, this visit was completed with telemedicine (audio/video) technology to reduce patient and provider exposure as well as to preserve personal protective equipment.   Kathryn Munoz has verbally consented to this TeleHealth visit. The patient is located at home, the provider is located at the [facility] office. The participants in this visit include the listed provider and patient. The visit was conducted today via telephone after video failed.  Kathryn Munoz was unable to use realtime audiovisual technology today and the telehealth visit was conducted via telephone.  Chief Complaint: OBESITY Kathryn Munoz is here to discuss her progress with her obesity treatment plan along with follow-up of her obesity related diagnoses. Kathryn Munoz is on the Category 3 Plan and states she is following her eating plan approximately 95% of the time. Kathryn Munoz states she is not exercising at this time.  Today's visit was #: 14 Starting weight: 252 lbs Starting date: 07/17/2019  Interim History: Kathryn Munoz is doing well with her water.  She is down to about 232 pounds.  She has started the Saxenda without any side effects.  Subjective:   1. Vitamin D deficiency Kathryn Munoz's Vitamin D level was 47.4 on 01/07/2020. She is currently taking prescription vitamin D 50,000 IU each week. She denies nausea, vomiting or muscle weakness.  She gets minimal sun exposure.  2. Insulin resistance Kathryn Munoz has a diagnosis of insulin resistance based on her elevated fasting insulin level >5. She continues to work on diet and exercise to decrease her risk of diabetes.  She is on no medications for this.  Lab Results  Component Value Date   INSULIN 29.0 (H) 01/07/2020   INSULIN 32.5 (H) 10/14/2019   INSULIN 19.6 07/17/2019   Lab Results  Component Value Date   HGBA1C 5.2 01/07/2020   Assessment/Plan:   1. Vitamin D deficiency Low Vitamin D level contributes to fatigue and are  associated with obesity, breast, and colon cancer. She agrees to continue to take prescription Vitamin D @50 ,000 IU every week and will follow-up for routine testing of Vitamin D, at least 2-3 times per year to avoid over-replacement.  -Refill Vitamin D, Ergocalciferol, (DRISDOL) 1.25 MG (50000 UNIT) CAPS capsule; Take 1 capsule (50,000 Units total) by mouth every 7 (seven) days.  Dispense: 4 capsule; Refill: 0  2. Insulin resistance Kathryn Munoz will continue to work on weight loss, exercise, and decreasing simple carbohydrates to help decrease the risk of diabetes. Kathryn Munoz agreed to follow-up with [facility] as directed to closely monitor her progress.  Decrease carbohydrates and increase healthy fats and protein.   3. Class 2 severe obesity with serious comorbidity and body mass index (BMI) of 39.0 to 39.9 in adult, unspecified obesity type [facility])  Kathryn Munoz is currently in the action stage of change. As such, her goal is to continue with weight loss efforts. She has agreed to the Category 3 Plan.   She will work on meal planning, intentional eating, and will continue Saxenda.  Exercise goals: Will continue to go to the gym.  Behavioral modification strategies: increasing lean protein intake, decreasing simple carbohydrates, increasing vegetables, increasing water intake, decreasing eating out, no skipping meals, meal planning and cooking strategies, keeping healthy foods in the home and planning for success.  Kathryn Munoz has agreed to follow-up with our clinic on 05/04/2020. She was informed of the importance of frequent follow-up visits to maximize her success with intensive lifestyle modifications for her multiple health conditions.  Objective:   VITALS: Per patient if  applicable, see vitals. GENERAL: Alert and in no acute distress. CARDIOPULMONARY: No increased WOB. Speaking in clear sentences.  PSYCH: Pleasant and cooperative. Speech normal rate and rhythm. Affect is appropriate. Insight and judgement are  appropriate. Attention is focused, linear, and appropriate.  NEURO: Oriented as arrived to appointment on time with no prompting.   Lab Results  Component Value Date   CREATININE 0.97 10/14/2019   BUN 9 10/14/2019   NA 139 10/14/2019   K 4.6 10/14/2019   CL 104 10/14/2019   CO2 22 10/14/2019   Lab Results  Component Value Date   ALT 13 10/14/2019   AST 16 10/14/2019   ALKPHOS 65 10/14/2019   BILITOT 0.4 10/14/2019   Lab Results  Component Value Date   HGBA1C 5.2 01/07/2020   HGBA1C 5.2 10/14/2019   HGBA1C 5.5 07/17/2019   HGBA1C 5.7 (H) 03/31/2019   HGBA1C 4.8 05/09/2018   Lab Results  Component Value Date   INSULIN 29.0 (H) 01/07/2020   INSULIN 32.5 (H) 10/14/2019   INSULIN 19.6 07/17/2019   Lab Results  Component Value Date   TSH 2.210 07/17/2019   Lab Results  Component Value Date   CHOL 186 10/14/2019   HDL 55 10/14/2019   LDLCALC 113 (H) 10/14/2019   TRIG 102 10/14/2019   CHOLHDL 3.7 03/31/2019   Lab Results  Component Value Date   WBC 8.0 07/17/2019   HGB 12.6 07/17/2019   HCT 38.6 07/17/2019   MCV 98 (H) 07/17/2019   PLT 319 07/17/2019   Attestation Statements:   Reviewed by clinician on day of visit: allergies, medications, problem list, medical history, surgical history, family history, social history, and previous encounter notes.  Time spent on visit including pre-visit chart review and post-chart visit care was 20 minutes.   I, Insurance claims handler, CMA, am acting as Energy manager for [employer], DO  I have reviewed the above documentation for accuracy and completeness, and I agree with the above. Corinna Capra, DO

## 2020-04-08 ENCOUNTER — Other Ambulatory Visit: Payer: Self-pay

## 2020-04-08 ENCOUNTER — Encounter (INDEPENDENT_AMBULATORY_CARE_PROVIDER_SITE_OTHER): Payer: Self-pay | Admitting: Bariatrics

## 2020-04-08 ENCOUNTER — Ambulatory Visit (INDEPENDENT_AMBULATORY_CARE_PROVIDER_SITE_OTHER): Payer: Self-pay | Admitting: Licensed Clinical Social Worker

## 2020-04-08 DIAGNOSIS — F3176 Bipolar disorder, in full remission, most recent episode depressed: Secondary | ICD-10-CM

## 2020-04-08 NOTE — Progress Notes (Signed)
Virtual Visit via Video Note  I connected with Myra Rude on 04/08/20 at  3:00 PM EST by a video enabled telemedicine application and verified that I am speaking with the correct person using two identifiers.  Location: Patient: home Provider: [facility]   I discussed the limitations of evaluation and management by telemedicine and the availability of in person appointments. The patient expressed understanding and agreed to proceed.  The patient was advised to call back or seek an in-person evaluation if the symptoms worsen or if the condition fails to improve as anticipated.  I provided 30 minutes of non-face-to-face time during this encounter.   Yehudah Standing R Mariana Wiederholt, LCSW    THERAPIST PROGRESS NOTE  Session Time: 3-3:30p  Participation Level: Active  Behavioral Response: Neat and Well GroomedAlertAnxious  Type of Therapy: Individual Therapy  Treatment Goals addressed: Coping  Interventions: Supportive  Summary: ALETHIA MELENDREZ is a 45 y.o. female who presents with improving symptoms related to bipolar disorder diagnosis. Pt reports that her mood has been stable, and that she is managing stress and anxiety well.   Allowed pt to explore and express thoughts and feelings triggered by positive changes that are happening. Pt reports that medication dosages were increased by psychiatrist and pt feels that mood has leveled out and is staying consistent. "I am in a really good place right now".  Praised pts progress and explored some other coping mechanisms that pt is currently utilizing. Helped pt identify current coping mechanisms and encouraged pt to continue utilizing to help maintain current levels of progress.  Pt excited about trips planned--[place] and [place]. Discussed mindful travels and making the best of the time that you are spending in a novel location. Encouraged bringing mindfulness moments with her and encouraging travel peers to engage too.    Suicidal/Homicidal: No  Therapist Response: Ameliyah continues to make good progress with self understanding, self insight, self care, life management, and overall self esteem and confidence. Ashantae reports a decrease in overall symptoms, which is reflective of ongoing progress. Treatment to continue as indicated.  Plan: Return again in 4 weeks.  Diagnosis: Axis I: Bipolar, Depressed    Axis II: No diagnosis    Ernest Haber Jeshurun [place], LCSW 04/08/2020

## 2020-04-13 ENCOUNTER — Other Ambulatory Visit (INDEPENDENT_AMBULATORY_CARE_PROVIDER_SITE_OTHER): Payer: Self-pay | Admitting: Bariatrics

## 2020-04-13 DIAGNOSIS — E038 Other specified hypothyroidism: Secondary | ICD-10-CM

## 2020-04-13 NOTE — Telephone Encounter (Signed)
Refill request

## 2020-04-27 ENCOUNTER — Encounter: Payer: Self-pay | Admitting: Psychiatry

## 2020-04-27 ENCOUNTER — Telehealth (INDEPENDENT_AMBULATORY_CARE_PROVIDER_SITE_OTHER): Payer: BC Managed Care – PPO | Admitting: Psychiatry

## 2020-04-27 ENCOUNTER — Other Ambulatory Visit: Payer: Self-pay

## 2020-04-27 DIAGNOSIS — F429 Obsessive-compulsive disorder, unspecified: Secondary | ICD-10-CM | POA: Diagnosis not present

## 2020-04-27 DIAGNOSIS — F5105 Insomnia due to other mental disorder: Secondary | ICD-10-CM | POA: Diagnosis not present

## 2020-04-27 DIAGNOSIS — F3176 Bipolar disorder, in full remission, most recent episode depressed: Secondary | ICD-10-CM

## 2020-04-27 MED ORDER — QUETIAPINE FUMARATE 25 MG PO TABS: 25.0000 mg | ORAL_TABLET | Freq: Every day | ORAL | 0 refills | Status: DC

## 2020-04-27 NOTE — Progress Notes (Signed)
Virtual Visit via Video Note  I connected with Kathryn Munoz on 04/27/20 at 11:00 AM EST by a video enabled telemedicine application and verified that I am speaking with the correct person using two identifiers.  Location Provider Location : [facility] Patient Location : Work  Participants: Patient , Provider   I discussed the limitations of evaluation and management by telemedicine and the availability of in person appointments. The patient expressed understanding and agreed to proceed.   I discussed the assessment and treatment plan with the patient. The patient was provided an opportunity to ask questions and all were answered. The patient agreed with the plan and demonstrated an understanding of the instructions.   The patient was advised to call back or seek an in-person evaluation if the symptoms worsen or if the condition fails to improve as anticipated.   BH MD OP Progress Note  04/27/2020 12:57 PM KARYNNA HAWKINS  MRN:  [id number]  Chief Complaint:  Chief Complaint    Follow-up     HPI: Kathryn Munoz is a 45 year old African-American female, lives in [place], has a history of bipolar disorder, insomnia, OCD, insulin resistance, hypothyroidism, other specified eating disorder was evaluated by telemedicine today.  Patient today reports she has had episodes of grief the past few days since its the death anniversary of her mother coming up in March.  She however reports she has been coping okay.  Denies any significant mood swings.  Denies any manic episodes.  She reports sleep is good.  She is compliant on medications as prescribed.  She however reports one of the medications as making her drowsy when she wakes up in the morning.  She does not know if it is the Lamictal or the Seroquel.  She is agreeable to tapering down the Seroquel this visit.  Patient denies any suicidality, homicidality or perceptual disturbances.  She reports she has established care with a new therapist  and therapy sessions as beneficial.  Patient denies any other concerns today.  Visit Diagnosis:    ICD-10-CM   1. Bipolar disorder, in full remission, most recent episode depressed (HCC)  F31.76 QUEtiapine (SEROQUEL) 25 MG tablet  2. Insomnia due to mental condition  F51.05   3. 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  F42.9     Past Psychiatric History: I have reviewed past psychiatric history from my progress note on 11/08/2017.  Past trials of Celexa.  Past history of suicide attempt at the age of 26 when she overdosed on pills, was admitted to the hospital.  Another episode of overdose on NyQuil at the age of 20, however slept through it and did not tell anyone.  Past Medical History:  Past Medical History:  Diagnosis Date  . Abnormal mammogram    breast biopsy, [facility] 2014  . ADD (attention deficit disorder)   . Anxiety   . Bipolar 2 disorder (HCC)   . Constipation   . Depression   . Dermatitis   . Heavy menstrual bleeding   . History of gestational diabetes   . History of kidney stones 2014  . Hypothyroidism   . IFG (impaired fasting glucose)   . Insulin resistance   . Irregular periods   . Joint pain   . Multiple food allergies    Dates and nuts  . Neuropathy   . Obesity   . OCD (obsessive compulsive disorder)   . Prediabetes   . Vitamin D deficiency disease     Past Surgical History:  Procedure Laterality Date  . BREAST BIOPSY Left 2014   [place] bx/clip-neg  . INTRAUTERINE DEVICE INSERTION  03/11/2012    Family Psychiatric History: I have reviewed family psychiatric history from my progress note on 11/08/2017  Family History:  Family History  Problem Relation Age of Onset  . Diabetes Mother   . Pancreatitis Mother   . Hypertension Mother   . Alcohol abuse Mother   . Schizophrenia Mother   . Depression Mother   . Heart disease Mother   . Stroke Mother   . Thyroid disease Mother   . Kidney disease Mother   . Anxiety disorder Mother   .  Alcohol abuse Father   . Diabetes Sister        pre-diabetic  . Diabetes Brother   . Drug abuse Brother   . ADD / ADHD Son   . Eczema Son   . Breast cancer Cousin        pat cousin  . Anxiety disorder Other   . Ovarian cancer Neg Hx     Social History: Reviewed social history from my progress note on 11/08/2017 Social History   Socioeconomic History  . Marital status: Married    Spouse name: Analysia Dungee  . Number of children: 2  . Years of education: Not on file  . Highest education level: Bachelor's degree (e.g., BA, AB, BS)  Occupational History  . Occupation: Charity fundraiser  Tobacco Use  . Smoking status: Never Smoker  . Smokeless tobacco: Never Used  Vaping Use  . Vaping Use: Never used  Substance and Sexual Activity  . Alcohol use: No  . Drug use: No  . Sexual activity: Yes    Partners: Male    Birth control/protection: Other-see comments    Comment: nuvaring  Other Topics Concern  . Not on file  Social History Narrative  . Not on file   Social Determinants of Health   Financial Resource Strain: Not on file  Food Insecurity: Not on file  Transportation Needs: Not on file  Physical Activity: Not on file  Stress: Not on file  Social Connections: Not on file    Allergies:  Allergies  Allergen Reactions  . Date Seed Extract  [Zizyphus Jujuba] Anaphylaxis, Cough, Itching and Swelling  . Other Swelling    Metabolic Disorder Labs: Lab Results  Component Value Date   HGBA1C 5.2 01/07/2020   MPG 117 03/31/2019   MPG 91 05/09/2018   No results found for: PROLACTIN Lab Results  Component Value Date   CHOL 186 10/14/2019   TRIG 102 10/14/2019   HDL 55 10/14/2019   CHOLHDL 3.7 03/31/2019   VLDL 14 10/16/2016   LDLCALC 113 (H) 10/14/2019   LDLCALC 129 (H) 07/17/2019   Lab Results  Component Value Date   TSH 2.210 07/17/2019   TSH 8.40 (H) 03/31/2019    Therapeutic Level Labs: No results found for: LITHIUM No results found for: VALPROATE No  components found for:  CBMZ  Current Medications: Current Outpatient Medications  Medication Sig Dispense Refill  . QUEtiapine (SEROQUEL) 25 MG tablet Take 1 tablet (25 mg total) by mouth at bedtime. 90 tablet 0  . citalopram (CELEXA) 40 MG tablet Take 1 tablet (40 mg total) by mouth daily. 90 tablet 1  . EPINEPHrine 0.3 mg/0.3 mL IJ SOAJ injection INJECT 0.3 MLS INTO THE MUSCLE ONCE FOR 1 DOSE. FOR LIFE-THREATENING ALLERGIC REACTION / ANAPHYLAXIS  1  . etonogestrel-ethinyl estradiol (NUVARING) 0.12-0.015 MG/24HR vaginal ring INSERT 1 RING VAGINALLY  AS DIRECTED. REMOVE AFTER 3 WEEKS & WAIT 7 DAYS BEFORE INSERTING A NEW RING 3 each 3  . Insulin Pen Needle 32G X 6 MM MISC Will use with Saxenda daily 50 each 0  . lamoTRIgine (LAMICTAL) 200 MG tablet Take 0.5 tablets (100 mg total) by mouth 2 (two) times daily. 90 tablet 0  . levothyroxine (SYNTHROID) 75 MCG tablet TAKE 1 TABLET BY MOUTH DAILY BEFORE BREAKFAST. 30 tablet 0  . Liraglutide -Weight Management (SAXENDA) 18 MG/3ML SOPN Inject 0.6 mg into the skin daily. 6 mL 0  . metFORMIN (GLUCOPHAGE-XR) 500 MG 24 hr tablet TAKE 3 TABLETS (1,500 MG TOTAL) BY MOUTH DAILY. 270 tablet 3  . polyethylene glycol (MIRALAX / GLYCOLAX) packet Take 17 g by mouth as needed.     . traZODone (DESYREL) 50 MG tablet TAKE 1 AND 1/2 TO 2 TABLETS (75-100 MG TOTAL) BY MOUTH AT BEDTIME AS NEEDED FOR SLEEP. FOR SLEEP 180 tablet 1  . Vitamin D, Ergocalciferol, (DRISDOL) 1.25 MG (50000 UNIT) CAPS capsule Take 1 capsule (50,000 Units total) by mouth every 7 (seven) days. 4 capsule 0   No current facility-administered medications for this visit.     Musculoskeletal: Strength & Muscle Tone: UTA Gait & Station: UTA Patient leans: N/A  Psychiatric Specialty Exam: Review of Systems  Psychiatric/Behavioral:       Grieving  All other systems reviewed and are negative.   There were no vitals taken for this visit.There is no height or weight on file to calculate BMI.   General Appearance: Casual  Eye Contact:  Fair  Speech:  Clear and Coherent  Volume:  Normal  Mood:  Grieving - coping well  Affect:  Congruent  Thought Process:  Goal Directed and Descriptions of Associations: Intact  Orientation:  Full (Time, Place, and Person)  Thought Content: Logical   Suicidal Thoughts:  No  Homicidal Thoughts:  No  Memory:  Immediate;   Fair Recent;   Fair Remote;   Fair  Judgement:  Fair  Insight:  Fair  Psychomotor Activity:  Normal  Concentration:  Concentration: Fair and Attention Span: Fair  Recall:  [name] of Knowledge: Fair  Language: Fair  Akathisia:  No  Handed:  Right  AIMS (if indicated): UTA  Assets:  Communication Skills Desire for Improvement Housing Intimacy Social Support Talents/Skills Transportation Vocational/Educational  ADL's:  Intact  Cognition: WNL  Sleep:  Fair   Screenings: Optometrist Office Visit from 11/11/2019 in [facility] Office Visit from 07/17/2019 in [facility] Office Visit from 03/24/2019 in [facility] Office Visit from 11/08/2018 in [facility] Nutrition from 08/26/2018 in [facility] [place]  PHQ-2 Total Score 0 6 1 0 2  PHQ-9 Total Score 1 19 5  0 9       Assessment and Plan: JIMI GIZA is a 45 year old African-American female, married, employed, lives in [place], has a history of bipolar disorder, sleep problems, OCD, hypothyroidism was evaluated by telemedicine today.  Patient is biologically predisposed given her history of mental health problems in her family, history of trauma.  Patient with psychosocial stressors of the death anniversary of her mother coming up.  Patient will benefit from the following plan.  Plan Bipolar disorder in remission Lamotrigine 200 mg p.o. daily.  Advised patient to take it in divided dosage since it is making her possibly groggy in the morning.  Also advised to take the  200 mg  earlier , around 6 PM. Celexa 40 mg p.o. daily Reduce Seroquel to 25 mg p.o. nightly.  OCD-stable Seroquel 25 mg p.o. nightly Continue trazodone 50 mg p.o. nightly as needed  Patient to continue psychotherapy sessions with her therapist.  Follow-up in clinic in 2 weeks or sooner if needed.  I have spent atleast 20 minutes face to face by video with patient today. More than 50 % of the time was spent for preparing to see the patient ( e.g., review of test, records ), ordering medications and test ,psychoeducation and supportive psychotherapy and care coordination,as well as documenting clinical information in electronic health record. This note was generated in part or whole with voice recognition software. Voice recognition is usually quite accurate but there are transcription errors that can and very often do occur. I apologize for any typographical errors that were not detected and corrected.       Jomarie Longs, MD 04/27/2020, 12:57 PM

## 2020-05-04 ENCOUNTER — Ambulatory Visit (INDEPENDENT_AMBULATORY_CARE_PROVIDER_SITE_OTHER): Payer: BC Managed Care – PPO | Admitting: Bariatrics

## 2020-05-04 ENCOUNTER — Other Ambulatory Visit: Payer: Self-pay

## 2020-05-04 ENCOUNTER — Encounter (INDEPENDENT_AMBULATORY_CARE_PROVIDER_SITE_OTHER): Payer: Self-pay | Admitting: Bariatrics

## 2020-05-04 VITALS — BP 129/89 | HR 78 | Temp 98.2°F | Ht 65.0 in | Wt 238.0 lb

## 2020-05-04 DIAGNOSIS — Z6839 Body mass index (BMI) 39.0-39.9, adult: Secondary | ICD-10-CM

## 2020-05-04 DIAGNOSIS — E7800 Pure hypercholesterolemia, unspecified: Secondary | ICD-10-CM

## 2020-05-04 DIAGNOSIS — E559 Vitamin D deficiency, unspecified: Secondary | ICD-10-CM | POA: Diagnosis not present

## 2020-05-04 DIAGNOSIS — E039 Hypothyroidism, unspecified: Secondary | ICD-10-CM

## 2020-05-04 DIAGNOSIS — E8881 Metabolic syndrome: Secondary | ICD-10-CM | POA: Diagnosis not present

## 2020-05-04 DIAGNOSIS — Z9189 Other specified personal risk factors, not elsewhere classified: Secondary | ICD-10-CM

## 2020-05-04 DIAGNOSIS — K5903 Drug induced constipation: Secondary | ICD-10-CM

## 2020-05-04 MED ORDER — LINACLOTIDE 145 MCG PO CAPS: 145.0000 ug | ORAL_CAPSULE | Freq: Every day | ORAL | 0 refills | Status: DC

## 2020-05-04 MED ORDER — VITAMIN D (ERGOCALCIFEROL) 1.25 MG (50000 UNIT) PO CAPS
50000.0000 [IU] | ORAL_CAPSULE | ORAL | 0 refills | Status: DC
Start: 2020-05-04 — End: 2020-05-24

## 2020-05-05 LAB — LIPID PANEL WITH LDL/HDL RATIO
Cholesterol, Total: 176 mg/dL (ref 100–199)
HDL: 61 mg/dL (ref >39)
LDL Chol Calc (NIH): 93 mg/dL (ref 0–99)
LDL/HDL Ratio: 1.5 ratio (ref 0.0–3.2)
Triglycerides: 123 mg/dL (ref 0–149)
VLDL Cholesterol Cal: 22 mg/dL (ref 5–40)

## 2020-05-05 LAB — TSH+T4F+T3FREE
Free T4: 0.95 ng/dL (ref 0.82–1.77)
T3, Free: 3.1 pg/mL (ref 2.0–4.4)
TSH: 3.8 u[IU]/mL (ref 0.450–4.500)

## 2020-05-05 LAB — INSULIN, RANDOM: INSULIN: 46.7 u[IU]/mL — ABNORMAL HIGH (ref 2.6–24.9)

## 2020-05-05 LAB — COMPREHENSIVE METABOLIC PANEL
ALT: 7 IU/L (ref 0–32)
AST: 14 IU/L (ref 0–40)
Albumin/Globulin Ratio: 1.4 (ref 1.2–2.2)
Albumin: 4.1 g/dL (ref 3.8–4.8)
Alkaline Phosphatase: 61 IU/L (ref 44–121)
BUN/Creatinine Ratio: 10 (ref 9–23)
BUN: 10 mg/dL (ref 6–24)
Bilirubin Total: 0.4 mg/dL (ref 0.0–1.2)
CO2: 22 mmol/L (ref 20–29)
Calcium: 9.4 mg/dL (ref 8.7–10.2)
Chloride: 104 mmol/L (ref 96–106)
Creatinine, Ser: 0.97 mg/dL (ref 0.57–1.00)
GFR calc Af Amer: 83 mL/min/{1.73_m2} (ref >59)
GFR calc non Af Amer: 72 mL/min/{1.73_m2} (ref >59)
Globulin, Total: 2.9 g/dL (ref 1.5–4.5)
Glucose: 130 mg/dL — ABNORMAL HIGH (ref 65–99)
Potassium: 4.5 mmol/L (ref 3.5–5.2)
Sodium: 141 mmol/L (ref 134–144)
Total Protein: 7 g/dL (ref 6.0–8.5)

## 2020-05-05 LAB — VITAMIN D 25 HYDROXY (VIT D DEFICIENCY, FRACTURES): Vit D, 25-Hydroxy: 37.7 ng/mL (ref 30.0–100.0)

## 2020-05-05 LAB — HEMOGLOBIN A1C
Est. average glucose Bld gHb Est-mCnc: 97 mg/dL
Hgb A1c MFr Bld: 5 % (ref 4.8–5.6)

## 2020-05-05 NOTE — Progress Notes (Signed)
Chief Complaint:   OBESITY Kathryn Munoz is here to discuss her progress with her obesity treatment plan along with follow-up of her obesity related diagnoses. Kathryn Munoz is on the Category 3 Plan and states she is following her eating plan approximately 50% of the time. Kathryn Munoz states she is doing cardio and strength training 60 minutes 2 times per week.  Today's visit was #: 15 Starting weight: 252 lbs Starting date: 07/17/2019 Today's weight: 238 lbs Today's date: 05/04/2020 Total lbs lost to date: 14 lbs Total lbs lost since last in-office visit: 0  Interim History: Kathryn Munoz is up 1 lb since her last visit. She has had issues with her cycle for 3 weeks. She is having bloating and constipation.  Subjective:   1. Insulin resistance Kathryn Munoz has a diagnosis of insulin resistance based on her elevated fasting insulin level >5. She continues to work on diet and exercise to decrease her risk of diabetes.  2. Vitamin D deficiency Kathryn Munoz has a diagnosis of Vit D deficiency and reports minimal sunlight exposure.  3. Acquired hypothyroidism Kathryn Munoz is taking Synthroid 75 mcg as directed.   4. Elevated cholesterol Kathryn Munoz has a history of elevated cholesterol. She is not on statin therapy.  5. Drug-induced constipation Kathryn Munoz reports having a bowel movement every 3-4 days. She talked with her psychiatrist and was told psychiatric meds caused constipation. She has no history of intestinal obstruction.  6. At risk for osteoporosis Kathryn Munoz is at higher risk of osteopenia and osteoporosis due to Vitamin D deficiency.    Assessment/Plan:   1. Insulin resistance Kathryn Munoz will continue to work on weight loss, exercise, and decreasing simple carbohydrates to help decrease the risk of diabetes. Kathryn Munoz agreed to follow-up with [place] as directed to closely monitor her progress. - Insulin, random - Hemoglobin A1c  2. Vitamin D deficiency Low Vitamin D level contributes to fatigue and are  associated with obesity, breast, and colon cancer. She agrees to continue to take prescription Vitamin D @50 ,000 IU every week and will follow-up for routine testing of Vitamin D, at least 2-3 times per year to avoid over-replacement. - Vitamin D, Ergocalciferol, (DRISDOL) 1.25 MG (50000 UNIT) CAPS capsule; Take 1 capsule (50,000 Units total) by mouth every 7 (seven) days.  Dispense: 4 capsule; Refill: 0 - VITAMIN D 25 Hydroxy (Vit-D Deficiency, Fractures)  3. Acquired hypothyroidism Patient with long-standing hypothyroidism, on levothyroxine therapy. She appears euthyroid. Orders and follow up as documented in patient record.  Counseling . Good thyroid control is important for overall health. Supratherapeutic thyroid levels are dangerous and will not improve weight loss results. . The correct way to take levothyroxine is fasting, with water, separated by at least 30 minutes from breakfast, and separated by more than 4 hours from calcium, iron, multivitamins, acid reflux medications (PPIs).   - TSH+T4F+T3Free  4. Elevated cholesterol Cardiovascular risk and specific lipid/LDL goals reviewed.  We discussed several lifestyle modifications today and Camiah will continue to work on diet, exercise and weight loss efforts. Orders and follow up as documented in patient record.   Counseling Intensive lifestyle modifications are the first line treatment for this issue. . Dietary changes: Increase soluble fiber. Decrease simple carbohydrates. . Exercise changes: Moderate to vigorous-intensity aerobic activity 150 minutes per week if tolerated. . Lipid-lowering medications: see documented in medical record.  - Lipid Panel With LDL/HDL Ratio - Comprehensive metabolic panel  5. Drug-induced constipation Take Linzess 145 mg once daily with breakfast. Increase water and fiber intake.  6. At  risk for osteoporosis Kathryn Munoz is at higher risk of osteopenia and osteoporosis due to Vitamin D deficiency.    7. Class 2 severe obesity due to excess calories with serious comorbidity and body mass index (BMI) of 39.0 to 39.9 in adult [facility]) Kathryn Munoz is currently in the action stage of change. As such, her goal is to continue with weight loss efforts. She has agreed to the Category 3 Plan.   Kathryn Munoz will focus on mindful eating. She will adhere more closely to the plan. Saxenda increased to 1.2 mg from 0.6 mg.  Exercise goals: As is  Behavioral modification strategies: increasing lean protein intake, decreasing simple carbohydrates, increasing vegetables, increasing water intake, decreasing eating out, no skipping meals, meal planning and cooking strategies, keeping healthy foods in the home and planning for success.  Kathryn Munoz has agreed to follow-up with our clinic in 2-3 weeks. She was informed of the importance of frequent follow-up visits to maximize her success with intensive lifestyle modifications for her multiple health conditions.   Kathryn Munoz was informed we would discuss her lab results at her next visit unless there is a critical issue that needs to be addressed sooner. Kathryn Munoz agreed to keep her next visit at the agreed upon time to discuss these results.  Objective:   Blood pressure 129/89, pulse 78, temperature 98.2 F (36.8 C), height 5\' 5"  (1.651 m), weight 238 lb (108 kg), SpO2 97 %. Body mass index is 39.61 kg/m.  General: Cooperative, alert, well developed, in no acute distress. HEENT: Conjunctivae and lids unremarkable. Cardiovascular: Regular rhythm.  Lungs: Normal work of breathing. Neurologic: No focal deficits.   Lab Results  Component Value Date   CREATININE 0.97 05/04/2020   BUN 10 05/04/2020   NA 141 05/04/2020   K 4.5 05/04/2020   CL 104 05/04/2020   CO2 22 05/04/2020   Lab Results  Component Value Date   ALT 7 05/04/2020   AST 14 05/04/2020   ALKPHOS 61 05/04/2020   BILITOT 0.4 05/04/2020   Lab Results  Component Value Date   HGBA1C 5.0 05/04/2020    HGBA1C 5.2 01/07/2020   HGBA1C 5.2 10/14/2019   HGBA1C 5.5 07/17/2019   HGBA1C 5.7 (H) 03/31/2019   Lab Results  Component Value Date   INSULIN 46.7 (H) 05/04/2020   INSULIN 29.0 (H) 01/07/2020   INSULIN 32.5 (H) 10/14/2019   INSULIN 19.6 07/17/2019   Lab Results  Component Value Date   TSH 3.800 05/04/2020   Lab Results  Component Value Date   CHOL 176 05/04/2020   HDL 61 05/04/2020   LDLCALC 93 05/04/2020   TRIG 123 05/04/2020   CHOLHDL 3.7 03/31/2019   Lab Results  Component Value Date   WBC 8.0 07/17/2019   HGB 12.6 07/17/2019   HCT 38.6 07/17/2019   MCV 98 (H) 07/17/2019   PLT 319 07/17/2019    Attestation Statements:   Reviewed by clinician on day of visit: allergies, medications, problem list, medical history, surgical history, family history, social history, and previous encounter notes.  07/19/2019, am acting as Edmund Hilda for Energy manager, DO.  I have reviewed the above documentation for accuracy and completeness, and I agree with the above. [employer], DO

## 2020-05-07 ENCOUNTER — Other Ambulatory Visit (INDEPENDENT_AMBULATORY_CARE_PROVIDER_SITE_OTHER): Payer: Self-pay | Admitting: Bariatrics

## 2020-05-07 DIAGNOSIS — E038 Other specified hypothyroidism: Secondary | ICD-10-CM

## 2020-05-07 NOTE — Telephone Encounter (Signed)
Last OV with Dr Brown 

## 2020-05-09 ENCOUNTER — Encounter (INDEPENDENT_AMBULATORY_CARE_PROVIDER_SITE_OTHER): Payer: Self-pay | Admitting: Bariatrics

## 2020-05-10 NOTE — Telephone Encounter (Signed)
Refill request

## 2020-05-12 ENCOUNTER — Other Ambulatory Visit: Payer: Self-pay

## 2020-05-12 ENCOUNTER — Telehealth (INDEPENDENT_AMBULATORY_CARE_PROVIDER_SITE_OTHER): Payer: BC Managed Care – PPO | Admitting: Psychiatry

## 2020-05-12 ENCOUNTER — Encounter: Payer: Self-pay | Admitting: Psychiatry

## 2020-05-12 DIAGNOSIS — F5105 Insomnia due to other mental disorder: Secondary | ICD-10-CM

## 2020-05-12 DIAGNOSIS — F3176 Bipolar disorder, in full remission, most recent episode depressed: Secondary | ICD-10-CM | POA: Diagnosis not present

## 2020-05-12 DIAGNOSIS — F429 Obsessive-compulsive disorder, unspecified: Secondary | ICD-10-CM | POA: Diagnosis not present

## 2020-05-12 NOTE — Progress Notes (Unsigned)
Virtual Visit via Video Note  I connected with Kathryn Munoz on 05/12/20 at  2:20 PM EST by a video enabled telemedicine application and verified that I am speaking with the correct person using two identifiers.  Location Provider Location : [facility] Patient Location : Work  Participants: Patient , Provider   I discussed the limitations of evaluation and management by telemedicine and the availability of in person appointments. The patient expressed understanding and agreed to proceed.   I discussed the assessment and treatment plan with the patient. The patient was provided an opportunity to ask questions and all were answered. The patient agreed with the plan and demonstrated an understanding of the instructions.   The patient was advised to call back or seek an in-person evaluation if the symptoms worsen or if the condition fails to improve as anticipated.   BH MD OP Progress Note  05/12/2020 2:40 PM Kathryn Munoz  MRN:  [id number]  Chief Complaint:  Chief Complaint    Follow-up     HPI: Kathryn Munoz is a 45 year old African-American female, lives in [place], has a history of bipolar disorder, insomnia, OCD, insulin resistance, hypothyroidism, other specified eating disorder was evaluated by telemedicine today.  Patient today reports since reducing the dosage of Seroquel she has felt better with regards to her grogginess during the day.  She has not been able to change the timing of the Lamictal dosage to  evening since she has been busy at work.  Patient denies any significant mood lability, manic episodes or sadness at this time.  She reports sleep is overall okay.  Patient denies any suicidality or homicidality or perceptual disturbances.  She reports she has been compliant on medications.  She denies any other concerns today.  Visit Diagnosis:    ICD-10-CM   1. Bipolar disorder, in full remission, most recent episode depressed (HCC)  F31.76   2. Insomnia due to  mental condition  F51.05   3. 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  F42.9     Past Psychiatric History: I have reviewed past psychiatric history from my progress note on 11/08/2017.  Past trials of Celexa.  Past history of suicide attempt at the age of 45 when she overdosed on pills was admitted to the hospital.  Another episode of overdose on NyQuil at the age of 45, slept through it.  She did not tell anyone.  Past Medical History:  Past Medical History:  Diagnosis Date  . Abnormal mammogram    breast biopsy, [facility] 2014  . ADD (attention deficit disorder)   . Anxiety   . Bipolar 2 disorder (HCC)   . Constipation   . Depression   . Dermatitis   . Heavy menstrual bleeding   . History of gestational diabetes   . History of kidney stones 2014  . Hypothyroidism   . IFG (impaired fasting glucose)   . Insulin resistance   . Irregular periods   . Joint pain   . Multiple food allergies    Dates and nuts  . Neuropathy   . Obesity   . OCD (obsessive compulsive disorder)   . Prediabetes   . Vitamin D deficiency disease     Past Surgical History:  Procedure Laterality Date  . BREAST BIOPSY Left 2014   us bx/clip-neg  . INTRAUTERINE DEVICE INSERTION  03/11/2012    Family Psychiatric History: I have reviewed family psychiatric history from my progress note on 11/08/2017  Family History:  Family History  Problem Relation Age of Onset  . Diabetes Mother   . Pancreatitis Mother   . Hypertension Mother   . Alcohol abuse Mother   . Schizophrenia Mother   . Depression Mother   . Heart disease Mother   . Stroke Mother   . Thyroid disease Mother   . Kidney disease Mother   . Anxiety disorder Mother   . Alcohol abuse Father   . Diabetes Sister        pre-diabetic  . Diabetes Brother   . Drug abuse Brother   . ADD / ADHD Son   . Eczema Son   . Breast cancer Cousin        pat cousin  . Anxiety disorder Other   . Ovarian cancer Neg Hx     Social History:  Reviewed social history from my progress note on 11/08/2017 Social History   Socioeconomic History  . Marital status: Married    Spouse name: Kynnadi Dicenso  . Number of children: 2  . Years of education: Not on file  . Highest education level: Bachelor's degree (e.g., BA, AB, BS)  Occupational History  . Occupation: Charity fundraiser  Tobacco Use  . Smoking status: Never Smoker  . Smokeless tobacco: Never Used  Vaping Use  . Vaping Use: Never used  Substance and Sexual Activity  . Alcohol use: No  . Drug use: No  . Sexual activity: Yes    Partners: Male    Birth control/protection: Other-see comments    Comment: nuvaring  Other Topics Concern  . Not on file  Social History Narrative  . Not on file   Social Determinants of Health   Financial Resource Strain: Not on file  Food Insecurity: Not on file  Transportation Needs: Not on file  Physical Activity: Not on file  Stress: Not on file  Social Connections: Not on file    Allergies:  Allergies  Allergen Reactions  . Date Seed Extract  [Zizyphus Jujuba] Anaphylaxis, Cough, Itching and Swelling  . Other Swelling    Metabolic Disorder Labs: Lab Results  Component Value Date   HGBA1C 5.0 05/04/2020   MPG 117 03/31/2019   MPG 91 05/09/2018   No results found for: PROLACTIN Lab Results  Component Value Date   CHOL 176 05/04/2020   TRIG 123 05/04/2020   HDL 61 05/04/2020   CHOLHDL 3.7 03/31/2019   VLDL 14 10/16/2016   LDLCALC 93 05/04/2020   LDLCALC 113 (H) 10/14/2019   Lab Results  Component Value Date   TSH 3.800 05/04/2020   TSH 2.210 07/17/2019    Therapeutic Level Labs: No results found for: LITHIUM No results found for: VALPROATE No components found for:  CBMZ  Current Medications: Current Outpatient Medications  Medication Sig Dispense Refill  . citalopram (CELEXA) 40 MG tablet Take 1 tablet (40 mg total) by mouth daily. 90 tablet 1  . EPINEPHrine 0.3 mg/0.3 mL IJ SOAJ injection INJECT 0.3 MLS INTO THE  MUSCLE ONCE FOR 1 DOSE. FOR LIFE-THREATENING ALLERGIC REACTION / ANAPHYLAXIS  1  . etonogestrel-ethinyl estradiol (NUVARING) 0.12-0.015 MG/24HR vaginal ring INSERT 1 RING VAGINALLY AS DIRECTED. REMOVE AFTER 3 WEEKS & WAIT 7 DAYS BEFORE INSERTING A NEW RING 3 each 3  . Insulin Pen Needle 32G X 6 MM MISC Will use with Saxenda daily 50 each 0  . lamoTRIgine (LAMICTAL) 200 MG tablet Take 0.5 tablets (100 mg total) by mouth 2 (two) times daily. 90 tablet 0  . levothyroxine (SYNTHROID) 75 MCG tablet TAKE 1  TABLET BY MOUTH EVERY DAY BEFORE BREAKFAST 30 tablet 0  . linaclotide (LINZESS) 145 MCG CAPS capsule Take 1 capsule (145 mcg total) by mouth daily before breakfast. 30 capsule 0  . Liraglutide -Weight Management (SAXENDA) 18 MG/3ML SOPN Inject 0.6 mg into the skin daily. 6 mL 0  . metFORMIN (GLUCOPHAGE-XR) 500 MG 24 hr tablet TAKE 3 TABLETS (1,500 MG TOTAL) BY MOUTH DAILY. 270 tablet 3  . polyethylene glycol (MIRALAX / GLYCOLAX) packet Take 17 g by mouth as needed.     Marland Kitchen QUEtiapine (SEROQUEL) 25 MG tablet Take 1 tablet (25 mg total) by mouth at bedtime. 90 tablet 0  . traZODone (DESYREL) 50 MG tablet TAKE 1 AND 1/2 TO 2 TABLETS (75-100 MG TOTAL) BY MOUTH AT BEDTIME AS NEEDED FOR SLEEP. FOR SLEEP 180 tablet 1  . Vitamin D, Ergocalciferol, (DRISDOL) 1.25 MG (50000 UNIT) CAPS capsule Take 1 capsule (50,000 Units total) by mouth every 7 (seven) days. 4 capsule 0   No current facility-administered medications for this visit.     Musculoskeletal: Strength & Muscle Tone: UTA Gait & Station: normal Patient leans: N/A  Psychiatric Specialty Exam: Review of Systems  Psychiatric/Behavioral: Negative for agitation, behavioral problems, confusion, decreased concentration, dysphoric mood, hallucinations, self-injury, sleep disturbance and suicidal ideas. The patient is not nervous/anxious and is not hyperactive.   All other systems reviewed and are negative.   There were no vitals taken for this visit.There  is no height or weight on file to calculate BMI.  General Appearance: Casual  Eye Contact:  Fair  Speech:  Clear and Coherent  Volume:  Normal  Mood:  Euthymic  Affect:  Appropriate  Thought Process:  Goal Directed and Descriptions of Associations: Intact  Orientation:  Full (Time, Place, and Person)  Thought Content: Logical   Suicidal Thoughts:  No  Homicidal Thoughts:  No  Memory:  Immediate;   Fair Recent;   Fair Remote;   Fair  Judgement:  Fair  Insight:  Fair  Psychomotor Activity:  Normal  Concentration:  Concentration: Fair and Attention Span: Fair  Recall:  [name] of Knowledge: Fair  Language: Fair  Akathisia:  No  Handed:  Right  AIMS (if indicated): UTA  Assets:  Communication Skills Desire for Improvement Housing Social Support  ADL's:  Intact  Cognition: WNL  Sleep:  Fair   Screenings: PHQ2-9   Flowsheet Row Video Visit from 05/12/2020 in [facility] Office Visit from 11/11/2019 in [facility] Office Visit from 07/17/2019 in [facility] Office Visit from 03/24/2019 in [facility] Office Visit from 11/08/2018 in [facility]  PHQ-2 Total Score 0 0 6 1 0  PHQ-9 Total Score - 1 19 5  0    Flowsheet Row Video Visit from 05/12/2020 in [facility]  C-SSRS RISK CATEGORY No Risk       Assessment and Plan: Kathryn Munoz is a 45 year old African-American female, married, employed, lives in [place], has a history of bipolar disorder, sleep problems, OCD, hypothyroidism was evaluated by telemedicine today.  Patient is biologically predisposed given her history of mental health problems in her family, history of trauma.  Patient with psychosocial stressors of pandemic, job related stressors however overall she is doing well.  Plan Bipolar disorder in remission Lamotrigine 200 mg p.o. daily in divided dosage. Celexa 40 mg p.o.  daily Seroquel 25 mg p.o. nightly at reduced dosage  OCD-stable Seroquel 25 mg p.o. nightly  Celexa 40 mg p.o. daily Trazodone 50 mg p.o. nightly as needed  Continue psychotherapy sessions as needed  Follow-up in clinic in 2 to 3 months or sooner if needed.  I have spent atleast 20 minutes face to face by video with patient today. More than 50 % of the time was spent for preparing to see the patient ( e.g., review of test, records ), ordering medications and test ,psychoeducation and supportive psychotherapy and care coordination,as well as documenting clinical information in electronic health record. This note was generated in part or whole with voice recognition software. Voice recognition is usually quite accurate but there are transcription errors that can and very often do occur. I apologize for any typographical errors that were not detected and corrected.      Jomarie Longs, MD 05/13/2020, 1:26 PM

## 2020-05-13 ENCOUNTER — Other Ambulatory Visit: Payer: Self-pay

## 2020-05-13 ENCOUNTER — Ambulatory Visit (INDEPENDENT_AMBULATORY_CARE_PROVIDER_SITE_OTHER): Payer: BC Managed Care – PPO | Admitting: Licensed Clinical Social Worker

## 2020-05-13 DIAGNOSIS — F3176 Bipolar disorder, in full remission, most recent episode depressed: Secondary | ICD-10-CM

## 2020-05-13 NOTE — Progress Notes (Signed)
Virtual Visit via Video Note  I connected with Kathryn Munoz on 05/13/20 at  1:00 PM EST by a video enabled telemedicine application and verified that I am speaking with the correct person using two identifiers.  Location: Patient: home Provider: [facility]   I discussed the limitations of evaluation and management by telemedicine and the availability of in person appointments. The patient expressed understanding and agreed to proceed.  I discussed the assessment and treatment plan with the patient. The patient was provided an opportunity to ask questions and all were answered. The patient agreed with the plan and demonstrated an understanding of the instructions.   The patient was advised to call back or seek an in-person evaluation if the symptoms worsen or if the condition fails to improve as anticipated.  I provided 45 minutes of non-face-to-face time during this encounter.   Innocence Schlotzhauer R Tuyen Uncapher, LCSW    THERAPIST PROGRESS NOTE  Session Time: 1-1:45p  Participation Level: Active  Behavioral Response: NeatAlertAnxious  Type of Therapy: Individual Therapy  Treatment Goals addressed: Anxiety  Interventions: CBT, DBT and Supportive  Summary: Kathryn Munoz is a 45 y.o. female who presents with improving symptoms related to bipolar disorder. Pt reports that her overall mood is stable and that she is managing stress and anxiety well.   Allowed pt to explore thoughts and feelings related to grief, relationships, and overall stressors.  Role-played through a scenario that has been bothering pt--work-related incident. Discussed non verbal communication.   Suicidal/Homicidal: No  Therapist Response: Kathryn Munoz is able to implement relaxation and diversion activities to decrease overall stress and anxiety. Kathryn Munoz is continuing to develop good reframing skills. These behaviors reflect overall growth and progress. Treatment to continue.  Plan: Return again in 4  weeks.  Diagnosis: Axis I: Bipolar, Depressed    Axis II: No diagnosis    Ernest Haber Lynnlee Revels, LCSW 05/13/2020

## 2020-05-14 ENCOUNTER — Encounter: Payer: Self-pay | Admitting: Family Medicine

## 2020-05-14 ENCOUNTER — Ambulatory Visit: Payer: BC Managed Care – PPO | Admitting: Family Medicine

## 2020-05-14 VITALS — BP 112/76 | HR 93 | Temp 98.8°F | Resp 16 | Ht 65.0 in | Wt 240.5 lb

## 2020-05-14 DIAGNOSIS — F331 Major depressive disorder, recurrent, moderate: Secondary | ICD-10-CM

## 2020-05-14 DIAGNOSIS — E039 Hypothyroidism, unspecified: Secondary | ICD-10-CM

## 2020-05-14 DIAGNOSIS — R400 Somnolence: Secondary | ICD-10-CM

## 2020-05-14 DIAGNOSIS — E8881 Metabolic syndrome: Secondary | ICD-10-CM

## 2020-05-14 DIAGNOSIS — F5105 Insomnia due to other mental disorder: Secondary | ICD-10-CM

## 2020-05-14 DIAGNOSIS — Z6838 Body mass index (BMI) 38.0-38.9, adult: Secondary | ICD-10-CM

## 2020-05-14 LAB — CBC WITH DIFFERENTIAL/PLATELET
Absolute Monocytes: 248 cells/uL (ref 200–950)
Basophils Absolute: 40 cells/uL (ref 0–200)
Basophils Relative: 0.6 %
Eosinophils Absolute: 127 cells/uL (ref 15–500)
Eosinophils Relative: 1.9 %
HCT: 37.3 % (ref 35.0–45.0)
Hemoglobin: 12.3 g/dL (ref 11.7–15.5)
Lymphs Abs: 2533 cells/uL (ref 850–3900)
MCH: 31.1 pg (ref 27.0–33.0)
MCHC: 33 g/dL (ref 32.0–36.0)
MCV: 94.2 fL (ref 80.0–100.0)
MPV: 9.1 fL (ref 7.5–12.5)
Monocytes Relative: 3.7 %
Neutro Abs: 3752 cells/uL (ref 1500–7800)
Neutrophils Relative %: 56 %
Platelets: 349 10*3/uL (ref 140–400)
RBC: 3.96 10*6/uL (ref 3.80–5.10)
RDW: 10.9 % — ABNORMAL LOW (ref 11.0–15.0)
Total Lymphocyte: 37.8 %
WBC: 6.7 10*3/uL (ref 3.8–10.8)

## 2020-05-14 NOTE — Progress Notes (Signed)
Name: Kathryn Munoz   MRN: [id number]    DOB: March 09, 1976   Date:05/14/2020       Progress Note  Chief Complaint  Patient presents with  . Follow-up     Subjective:   Kathryn Munoz is a 45 y.o. female, presents to clinic for f/up  Constipation-  Trying linzess and has tried [employer] veggies, drinking ample water, low fat daily, eggs, protein     Mood/depression/anxiety family issues - doing therapy - seems to be helping but holidays are hard [facility] 05/14/2020 11/11/2019 07/17/2019  Decreased Interest 0 0 3  Down, Depressed, Hopeless 2 0 3  PHQ - 2 Score 2 0 6  Altered sleeping 3 0 3  Tired, decreased energy 1 0 3  Change in appetite 1 0 2  Feeling bad or failure about yourself  0 0 1  Trouble concentrating 0 1 1  Moving slowly or fidgety/restless 0 0 2  Suicidal thoughts 0 0 1  PHQ-9 Score 7 1 19   Difficult doing work/chores Somewhat difficult Not difficult at all Very difficult  Some encounter information is confidential and restricted. Go to Review Flowsheets activity to see all data.  Some recent data might be hidden   GAD 7 : Generalized Anxiety Score 05/14/2020  Nervous, Anxious, on Edge 0  Control/stop worrying 2  Worry too much - different things 0  Trouble relaxing 0  Restless 0  Easily annoyed or irritable 2  Afraid - awful might happen 0  Total GAD 7 Score 4  Anxiety Difficulty Somewhat difficult   Citalopram and lamictal for moods working with Dr. Elna BreslowEappen  She was told to f/up on labs recently done weight management - reviewed labs with pt today  Sugar elevated but A1C normal Hx of vit d deficiency - labs normal/optimal   Cholesterol reviewed - total chol normal, HDL high/good, LDL in normal range 93 - improved from prior Lab Results  Component Value Date   CHOL 176 05/04/2020   CHOL 186 10/14/2019   CHOL 193 07/17/2019   Lab Results  Component Value Date   HDL 61 05/04/2020   HDL 55 10/14/2019   HDL 39 (L)  07/17/2019   Lab Results  Component Value Date   LDLCALC 93 05/04/2020   LDLCALC 113 (H) 10/14/2019   LDLCALC 129 (H) 07/17/2019   Lab Results  Component Value Date   TRIG 123 05/04/2020   TRIG 102 10/14/2019   TRIG 136 07/17/2019   Lab Results  Component Value Date   CHOLHDL 3.7 03/31/2019   CHOLHDL 3.1 11/08/2018   CHOLHDL 2.3 10/22/2017    Obesity - per specialist  Hypothyroid -  Lab Results  Component Value Date   TSH 3.800 05/04/2020  thyroid panel done by specialists - TSH in normal range, pt on 75 mcg levothyroxine daily    Current Outpatient Medications:  .  citalopram (CELEXA) 40 MG tablet, Take 1 tablet (40 mg total) by mouth daily., Disp: 90 tablet, Rfl: 1 .  EPINEPHrine 0.3 mg/0.3 mL IJ SOAJ injection, INJECT 0.3 MLS INTO THE MUSCLE ONCE FOR 1 DOSE. FOR LIFE-THREATENING ALLERGIC REACTION / ANAPHYLAXIS, Disp: , Rfl: 1 .  etonogestrel-ethinyl estradiol (NUVARING) 0.12-0.015 MG/24HR vaginal ring, INSERT 1 RING VAGINALLY AS DIRECTED. REMOVE AFTER 3 WEEKS & WAIT 7 DAYS BEFORE INSERTING A NEW RING, Disp: 3 each, Rfl: 3 .  Insulin Pen Needle 32G X 6 MM MISC, Will use with Saxenda daily, Disp: 50 each, Rfl: 0 .  lamoTRIgine (LAMICTAL) 200 MG tablet, Take 0.5 tablets (100 mg total) by mouth 2 (two) times daily., Disp: 90 tablet, Rfl: 0 .  levothyroxine (SYNTHROID) 75 MCG tablet, TAKE 1 TABLET BY MOUTH EVERY DAY BEFORE BREAKFAST, Disp: 30 tablet, Rfl: 0 .  linaclotide (LINZESS) 145 MCG CAPS capsule, Take 1 capsule (145 mcg total) by mouth daily before breakfast., Disp: 30 capsule, Rfl: 0 .  Liraglutide -Weight Management (SAXENDA) 18 MG/3ML SOPN, Inject 0.6 mg into the skin daily., Disp: 6 mL, Rfl: 0 .  metFORMIN (GLUCOPHAGE-XR) 500 MG 24 hr tablet, TAKE 3 TABLETS (1,500 MG TOTAL) BY MOUTH DAILY., Disp: 270 tablet, Rfl: 3 .  polyethylene glycol (MIRALAX / GLYCOLAX) packet, Take 17 g by mouth as needed. , Disp: , Rfl:  .  QUEtiapine (SEROQUEL) 25 MG tablet, Take 1 tablet  (25 mg total) by mouth at bedtime., Disp: 90 tablet, Rfl: 0 .  traZODone (DESYREL) 50 MG tablet, TAKE 1 AND 1/2 TO 2 TABLETS (75-100 MG TOTAL) BY MOUTH AT BEDTIME AS NEEDED FOR SLEEP. FOR SLEEP, Disp: 180 tablet, Rfl: 1 .  Vitamin D, Ergocalciferol, (DRISDOL) 1.25 MG (50000 UNIT) CAPS capsule, Take 1 capsule (50,000 Units total) by mouth every 7 (seven) days., Disp: 4 capsule, Rfl: 0  Patient Active Problem List   Diagnosis Date Noted  . Insulin resistance 01/08/2020  . Bipolar disorder, in full remission, most recent episode depressed (HCC) 12/23/2019  . Heavy menstrual period 06/28/2019  . 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 01/01/2019  . Bipolar 2 disorder, major depressive episode (HCC) 09/05/2018  . Insomnia due to mental condition 09/05/2018  . Class 3 severe obesity with serious comorbidity and body mass index (BMI) of 40.0 to 44.9 in adult (HCC) 05/09/2018  . Metabolic syndrome 10/27/2017  . Acanthosis nigricans 10/22/2017  . Moderate episode of recurrent major depressive disorder (HCC) 10/22/2017  . Acid reflux 04/01/2017  . Anxiety 06/01/2016  . Morbid obesity with BMI of 40.0-44.9, adult (HCC) 03/12/2015  . Peripheral neuropathy 10/09/2014  . Hypothyroidism   . IFG (impaired fasting glucose)   . Vitamin D deficiency   . Neuropathy     Past Surgical History:  Procedure Laterality Date  . BREAST BIOPSY Left 2014   [place] bx/clip-neg  . INTRAUTERINE DEVICE INSERTION  03/11/2012    Family History  Problem Relation Age of Onset  . Diabetes Mother   . Pancreatitis Mother   . Hypertension Mother   . Alcohol abuse Mother   . Schizophrenia Mother   . Depression Mother   . Heart disease Mother   . Stroke Mother   . Thyroid disease Mother   . Kidney disease Mother   . Anxiety disorder Mother   . Alcohol abuse Father   . Diabetes Sister        pre-diabetic  . Diabetes Brother   . Drug abuse Brother   . ADD / ADHD Son   . Eczema Son   . Breast cancer  Cousin        pat cousin  . Anxiety disorder Other   . Ovarian cancer Neg Hx     Social History   Tobacco Use  . Smoking status: Never Smoker  . Smokeless tobacco: Never Used  Vaping Use  . Vaping Use: Never used  Substance Use Topics  . Alcohol use: No  . Drug use: No     Allergies  Allergen Reactions  . Date Seed Extract  [Zizyphus Jujuba] Anaphylaxis, Cough, Itching and Swelling  .  Other Swelling    Health Maintenance  Topic Date Due  . COVID-19 Vaccine (3 - Booster for Pfizer series) 11/29/2019  . INFLUENZA VACCINE  06/17/2020 (Originally 10/19/2019)  . PAP SMEAR-Modifier  10/22/2020  . MAMMOGRAM  02/10/2021  . TETANUS/TDAP  10/10/2025  . Hepatitis C Screening  Completed  . HIV Screening  Completed    Chart Review Today: I personally reviewed active problem list, medication list, allergies, family history, social history, health maintenance, notes from last encounter, lab results, imaging with the patient/caregiver today.   Review of Systems  Constitutional: Negative.   HENT: Negative.   Eyes: Negative.   Respiratory: Negative.   Cardiovascular: Negative.   Gastrointestinal: Negative.   Endocrine: Negative.   Genitourinary: Negative.   Musculoskeletal: Negative.   Skin: Negative.   Allergic/Immunologic: Negative.   Neurological: Negative.   Hematological: Negative.   Psychiatric/Behavioral: Negative.   All other systems reviewed and are negative.    Objective:   Vitals:   05/14/20 0822  BP: 112/76  Pulse: 93  Resp: 16  Temp: 98.8 F (37.1 C)  SpO2: 98%  Weight: 240 lb 8 oz (109.1 kg)  Height: 5\' 5"  (1.651 m)    Body mass index is 40.02 kg/m.  Physical Exam Vitals and nursing note reviewed.  Constitutional:      General: She is not in acute distress.    Appearance: Normal appearance. She is well-developed. She is obese. She is not ill-appearing, toxic-appearing or diaphoretic.     Interventions: Face mask in place.  HENT:     Head:  Normocephalic and atraumatic.     Right Ear: External ear normal.     Left Ear: External ear normal.  Eyes:     General: Lids are normal. No scleral icterus.       Right eye: No discharge.        Left eye: No discharge.     Conjunctiva/sclera: Conjunctivae normal.  Neck:     Trachea: Phonation normal. No tracheal deviation.  Cardiovascular:     Rate and Rhythm: Normal rate and regular rhythm.     Pulses: Normal pulses.          Radial pulses are 2+ on the right side and 2+ on the left side.       Posterior tibial pulses are 2+ on the right side and 2+ on the left side.     Heart sounds: Normal heart sounds. No murmur heard. No friction rub. No gallop.   Pulmonary:     Effort: Pulmonary effort is normal. No respiratory distress.     Breath sounds: Normal breath sounds. No stridor. No wheezing, rhonchi or rales.  Chest:     Chest wall: No tenderness.  Abdominal:     General: Bowel sounds are normal. There is no distension.     Palpations: Abdomen is soft.  Musculoskeletal:     Right lower leg: No edema.     Left lower leg: No edema.  Skin:    General: Skin is warm and dry.     Coloration: Skin is not jaundiced or pale.     Findings: No rash.  Neurological:     Mental Status: She is alert.     Motor: No abnormal muscle tone.     Gait: Gait normal.  Psychiatric:        Mood and Affect: Mood normal.        Speech: Speech normal.        Behavior: Behavior normal.  Assessment & Plan:   1. Metabolic syndrome Per medical weight management On saxenda and metformin  2. Hypothyroidism, unspecified type Reviewed last labs - TSH normal, continue same dose - could possibly increase dose if pt continues to be very fatigued  3. Class 2 severe obesity with serious comorbidity and body mass index (BMI) of 38.0 to 38.9 in adult, unspecified obesity type [facility]) Per specialists Unfortunately weight increased slightly   4. Moderate episode of recurrent major depressive  disorder (HCC) phq and meds reviewed today - pt doing therapy and seeing psychiatry  5. Insomnia due to mental condition Managed by psych - encouraged pt to reach out and ask about meds and med SE  6. Sleepiness Chronic insomnia, offered to refer for sleep study if desired to r/o or eval for OSA  05/14/2020   0914   Epworth Sleepiness Scale   Sitting and reading High chance of dozing  Watching TV Slight chance of dozing  Sitting, inactive in a public place (e.g. a theatre or a meeting) Would never doze  As a passenger in a car for an hour without a break Slight chance of dozing  Lying down to rest in the afternoon when circumstances permit Slight chance of dozing  Sitting and talking to someone Would never doze  Sitting quietly after a lunch without alcohol Slight chance of dozing  In a car, while stopped for a few minutes in traffic Moderate chance of dozing  Total score 9   - CBC with Differential/Platelet - screening - r/o anemia, look for any abnormal findings that could be suggestive of OSA   Return for august CPE.   Danelle Berry, PA-C 05/14/20 8:50 AM

## 2020-05-24 ENCOUNTER — Other Ambulatory Visit: Payer: Self-pay

## 2020-05-24 ENCOUNTER — Ambulatory Visit (INDEPENDENT_AMBULATORY_CARE_PROVIDER_SITE_OTHER): Payer: BC Managed Care – PPO | Admitting: Adult Health

## 2020-05-24 ENCOUNTER — Encounter (INDEPENDENT_AMBULATORY_CARE_PROVIDER_SITE_OTHER): Payer: Self-pay | Admitting: Adult Health

## 2020-05-24 VITALS — BP 118/77 | HR 92 | Temp 97.9°F | Ht 65.0 in | Wt 236.0 lb

## 2020-05-24 DIAGNOSIS — Z9189 Other specified personal risk factors, not elsewhere classified: Secondary | ICD-10-CM

## 2020-05-24 DIAGNOSIS — Z6839 Body mass index (BMI) 39.0-39.9, adult: Secondary | ICD-10-CM

## 2020-05-24 DIAGNOSIS — E8881 Metabolic syndrome: Secondary | ICD-10-CM

## 2020-05-24 DIAGNOSIS — K5903 Drug induced constipation: Secondary | ICD-10-CM

## 2020-05-24 DIAGNOSIS — E559 Vitamin D deficiency, unspecified: Secondary | ICD-10-CM

## 2020-05-24 DIAGNOSIS — E7849 Other hyperlipidemia: Secondary | ICD-10-CM

## 2020-05-24 MED ORDER — VITAMIN D (ERGOCALCIFEROL) 1.25 MG (50000 UNIT) PO CAPS: 50000.0000 [IU] | ORAL_CAPSULE | ORAL | 0 refills | Status: DC

## 2020-05-24 MED ORDER — SAXENDA 18 MG/3ML ~~LOC~~ SOPN: 1.2000 mg | PEN_INJECTOR | Freq: Every day | SUBCUTANEOUS | 0 refills | Status: DC

## 2020-05-25 NOTE — Progress Notes (Signed)
Chief Complaint:   OBESITY Kathryn Munoz is here to discuss her progress with her obesity treatment plan along with follow-up of her obesity related diagnoses. Kathryn Munoz is on the Category 3 Plan and states she is following her eating plan approximately 40% of the time. Kathryn Munoz states she is walking 30 minutes 3 times per week.  Today's visit was #: 16 Starting weight: 252 lbs Starting date: 07/17/2019 Today's weight: 236 lbs Today's date: 05/24/2020 Total lbs lost to date: 16 lbs Total lbs lost since last in-office visit: 2 lbs  Interim History: Saxenda was increased from 0.6 mg to 1.2 daily at last OV 05/04/2020. Kathryn Munoz experienced nausea on 1.2 mg dose. She is backed down to 0.8 mg daily. Reports reduction in GI sx's with dose reduction.  Subjective:   1. Insulin resistance Worsening. Discussed labs with patient today. Kathryn Munoz's insulin level is steadily rising. It was 46.7 on 05/04/2020.   Lab Results  Component Value Date   INSULIN 46.7 (H) 05/04/2020   INSULIN 29.0 (H) 01/07/2020   INSULIN 32.5 (H) 10/14/2019   INSULIN 19.6 07/17/2019   Lab Results  Component Value Date   HGBA1C 5.0 05/04/2020    2. Drug-induced constipation Seroquel was decreased from 75 mg to 25 mg at bedtime. She just started Linzess 145 mg daily. She is now experiencing BM's 3 times a day. She denies abdominal pain or hematochezia.   3. Other hyperlipidemia Discussed labs with patient today. 05/04/2020 Lipid panel resulted all labs stable with LDL  Now at goal. Kathryn Munoz is not on statin therapy.  Lab Results  Component Value Date   ALT 7 05/04/2020   AST 14 05/04/2020   ALKPHOS 61 05/04/2020   BILITOT 0.4 05/04/2020   Lab Results  Component Value Date   CHOL 176 05/04/2020   HDL 61 05/04/2020   LDLCALC 93 05/04/2020   TRIG 123 05/04/2020   CHOLHDL 3.7 03/31/2019    4. Vitamin D deficiency Discussed labs with patient today. Kathryn Munoz's Vitamin D level was below goal of 50 at 37.7 on 05/04/2020.  She is currently taking prescription vitamin D 50,000 IU each week. She denies nausea, vomiting or muscle weakness.   Ref. Range 05/04/2020 07:48  Vitamin D, 25-Hydroxy Latest Ref Range: 30.0 - 100.0 ng/mL 37.7   5. At risk for nausea Kathryn Munoz is at risk for nausea due to slow titration of Saxenda.  Assessment/Plan:   1. Insulin resistance Kathryn Munoz will continue to work on weight loss, exercise, and decreasing simple carbohydrates to help decrease the risk of diabetes. Kathryn Munoz agreed to follow-up with [place] as directed to closely monitor her progress. Continue Saxenda.  2. Drug-induced constipation Continue Linzess and increase water intake.  3. Other hyperlipidemia Cardiovascular risk and specific lipid/LDL goals reviewed.  We discussed several lifestyle modifications today and Kathryn Munoz will continue to work on diet, exercise and weight loss efforts. Orders and follow up as documented in patient record. Continue Category 3 meal plan.  Counseling Intensive lifestyle modifications are the first line treatment for this issue. . Dietary changes: Increase soluble fiber. Decrease simple carbohydrates. . Exercise changes: Moderate to vigorous-intensity aerobic activity 150 minutes per week if tolerated. . Lipid-lowering medications: see documented in medical record.  4. Vitamin D deficiency Low Vitamin D level contributes to fatigue and are associated with obesity, breast, and colon cancer. She agrees to continue to take prescription Vitamin D @50 ,000 IU every week and will follow-up for routine testing of Vitamin D, at least 2-3 times per  year to avoid over-replacement.  - Vitamin D, Ergocalciferol, (DRISDOL) 1.25 MG (50000 UNIT) CAPS capsule; Take 1 capsule (50,000 Units total) by mouth every 7 (seven) days.  Dispense: 4 capsule; Refill: 0  5. At risk for nausea Kathryn Munoz was given approximately 15 minutes of nausea prevention counseling today. Kathryn Munoz is at risk for nausea due to her new  or current medication. She was encouraged to titrate her medication slowly, make sure to stay hydrated, eat smaller portions throughout the day, and avoid high fat meals.   6. Class 2 severe obesity with serious comorbidity and body mass index (BMI) of 39.0 to 39.9 in adult, unspecified obesity type Kathryn Munoz) Kathryn Munoz is currently in the action stage of change. As such, her goal is to continue with weight loss efforts. She has agreed to the Category 3 Plan.   We discussed various medication options to help Kathryn Munoz with her weight loss efforts and we both agreed to continue Saxenda 1.2 mg, as per below. - Liraglutide -Weight Management (SAXENDA) 18 MG/3ML SOPN; Inject 1.2 mg into the skin daily.  Dispense: 12 mL; Refill: 0  Exercise goals: As is  Behavioral modification strategies: increasing lean protein intake, increasing water intake, meal planning and cooking strategies and planning for success.  Kathryn Munoz has agreed to follow-up with our clinic in 3 weeks. She was informed of the importance of frequent follow-up visits to maximize her success with intensive lifestyle modifications for her multiple health conditions.   Objective:   Blood pressure 118/77, pulse 92, temperature 97.9 F (36.6 C), height 5\' 5"  (1.651 m), weight 236 lb (107 kg), SpO2 98 %. Body mass index is 39.27 kg/m.  General: Cooperative, alert, well developed, in no acute distress. HEENT: Conjunctivae and lids unremarkable. Cardiovascular: Regular rhythm.  Lungs: Normal work of breathing. Neurologic: No focal deficits.   Lab Results  Component Value Date   CREATININE 0.97 05/04/2020   BUN 10 05/04/2020   NA 141 05/04/2020   K 4.5 05/04/2020   CL 104 05/04/2020   CO2 22 05/04/2020   Lab Results  Component Value Date   ALT 7 05/04/2020   AST 14 05/04/2020   ALKPHOS 61 05/04/2020   BILITOT 0.4 05/04/2020   Lab Results  Component Value Date   HGBA1C 5.0 05/04/2020   HGBA1C 5.2 01/07/2020   HGBA1C 5.2 10/14/2019    HGBA1C 5.5 07/17/2019   HGBA1C 5.7 (H) 03/31/2019   Lab Results  Component Value Date   INSULIN 46.7 (H) 05/04/2020   INSULIN 29.0 (H) 01/07/2020   INSULIN 32.5 (H) 10/14/2019   INSULIN 19.6 07/17/2019   Lab Results  Component Value Date   TSH 3.800 05/04/2020   Lab Results  Component Value Date   CHOL 176 05/04/2020   HDL 61 05/04/2020   LDLCALC 93 05/04/2020   TRIG 123 05/04/2020   CHOLHDL 3.7 03/31/2019   Lab Results  Component Value Date   WBC 6.7 05/14/2020   HGB 12.3 05/14/2020   HCT 37.3 05/14/2020   MCV 94.2 05/14/2020   PLT 349 05/14/2020    Attestation Statements:   Reviewed by clinician on day of visit: allergies, medications, problem list, medical history, surgical history, family history, social history, and previous encounter notes.  05/16/2020, am acting as Edmund Hilda for Energy manager, NP.  I have reviewed the above documentation for accuracy and completeness, and I agree with the above. -  Ruba Outen d. Leemon Ayala, NP-C

## 2020-05-27 ENCOUNTER — Other Ambulatory Visit (INDEPENDENT_AMBULATORY_CARE_PROVIDER_SITE_OTHER): Payer: Self-pay | Admitting: Bariatrics

## 2020-05-27 NOTE — Telephone Encounter (Signed)
request last filled 05/04/20 f/u scheduled 03/28

## 2020-05-27 NOTE — Telephone Encounter (Signed)
Last seen by Katy Danford, FNP. 

## 2020-06-01 ENCOUNTER — Encounter: Payer: Self-pay | Admitting: Family Medicine

## 2020-06-10 ENCOUNTER — Other Ambulatory Visit: Payer: Self-pay

## 2020-06-10 ENCOUNTER — Ambulatory Visit (INDEPENDENT_AMBULATORY_CARE_PROVIDER_SITE_OTHER): Payer: BC Managed Care – PPO | Admitting: Licensed Clinical Social Worker

## 2020-06-10 ENCOUNTER — Other Ambulatory Visit (INDEPENDENT_AMBULATORY_CARE_PROVIDER_SITE_OTHER): Payer: Self-pay | Admitting: Bariatrics

## 2020-06-10 DIAGNOSIS — E038 Other specified hypothyroidism: Secondary | ICD-10-CM

## 2020-06-10 DIAGNOSIS — F3176 Bipolar disorder, in full remission, most recent episode depressed: Secondary | ICD-10-CM

## 2020-06-10 NOTE — Progress Notes (Signed)
Virtual Visit via Video Note  I connected with Kathryn Munoz on 06/10/20 at  1:00 PM EDT by a video enabled telemedicine application and verified that I am speaking with the correct person using two identifiers.  Location: Patient: home Provider: [facility]   I discussed the limitations of evaluation and management by telemedicine and the availability of in person appointments. The patient expressed understanding and agreed to proceed.   I discussed the assessment and treatment plan with the patient. The patient was provided an opportunity to ask questions and all were answered. The patient agreed with the plan and demonstrated an understanding of the instructions.   The patient was advised to call back or seek an in-person evaluation if the symptoms worsen or if the condition fails to improve as anticipated.  I provided 40 minutes of non-face-to-face time during this encounter.   Kathryn R Hussami, LCSW    THERAPIST PROGRESS NOTE  Session Time: 1-1:40p  Participation Level: Active  Behavioral Response: Neat and Well GroomedAlertAnxious  Type of Therapy: Individual Therapy  Treatment Goals addressed: Anxiety  Interventions: CBT and Meditation: mindfulness; meditation  Summary: Kathryn Munoz is a 45 y.o. female who presents with improving symptoms related to bipolar disorder diagnosis. Pt reports that overall mood has been stable and that she is managing stress/anxiety well. Pt reports that sleep quality and quantity was good until recently fostering a puppy that wakes her up at night sometimes.  Allowed pt to explore and express thoughts and feelings about recent life stressors. Pt is weighing out the pros and cons of adopting puppy that she is currently fostering versus allowing it to go to another good home. "I know that someone else will give it a good home--we have other neighbors that are sharing the foster process with [place] and I know that they will love it and take good care  of it".  Pt states that she knows the dog will be a good companion for herself and her sons--but is worried about the financial commitment and time commitment. Encouraged pt to weigh out the options very carefully and not make an impulsive decision.  Discussed pts continuing commitment to overall health and wellness--pt is down a few pounds and is feeling good about herself and eating/activity choices that she has made recently.   Pt excited about upcoming trip to [facility]. First time on plane. Discussed any anxiety related to this.   Continued recommendations are as follows: self care behaviors, positive social engagements, focusing on overall work/home/life balance, and focusing on positive physical and emotional wellness.   Suicidal/Homicidal: No  Therapist Response: Kathryn Munoz is able to identify and verbalize coping skills that have worked in the past and continues to utilize to manage symptoms. This is reflective of overall personal growth and progress. Treatment to continue  Plan: Return again in 4 weeks.  Diagnosis: Axis I: Bipolar, Depressed    Axis II: No diagnosis    Kathryn Haber Hussami, LCSW 06/10/2020

## 2020-06-10 NOTE — Telephone Encounter (Signed)
Refill request

## 2020-06-14 ENCOUNTER — Ambulatory Visit (INDEPENDENT_AMBULATORY_CARE_PROVIDER_SITE_OTHER): Payer: BC Managed Care – PPO | Admitting: Adult Health

## 2020-06-14 ENCOUNTER — Encounter (INDEPENDENT_AMBULATORY_CARE_PROVIDER_SITE_OTHER): Payer: Self-pay | Admitting: Adult Health

## 2020-06-14 ENCOUNTER — Other Ambulatory Visit: Payer: Self-pay

## 2020-06-14 VITALS — BP 114/75 | HR 84 | Temp 98.6°F | Ht 65.0 in | Wt 233.0 lb

## 2020-06-14 DIAGNOSIS — E559 Vitamin D deficiency, unspecified: Secondary | ICD-10-CM | POA: Diagnosis not present

## 2020-06-14 DIAGNOSIS — E8881 Metabolic syndrome: Secondary | ICD-10-CM

## 2020-06-14 DIAGNOSIS — Z6841 Body Mass Index (BMI) 40.0 and over, adult: Secondary | ICD-10-CM

## 2020-06-14 DIAGNOSIS — Z9189 Other specified personal risk factors, not elsewhere classified: Secondary | ICD-10-CM | POA: Diagnosis not present

## 2020-06-14 MED ORDER — VITAMIN D (ERGOCALCIFEROL) 1.25 MG (50000 UNIT) PO CAPS: 50000.0000 [IU] | ORAL_CAPSULE | ORAL | 0 refills | Status: DC

## 2020-06-14 MED ORDER — INSULIN PEN NEEDLE 32G X 6 MM MISC: 0 refills | Status: DC

## 2020-06-15 NOTE — Progress Notes (Signed)
Chief Complaint:   OBESITY Kathryn Munoz is here to discuss her progress with her obesity treatment plan along with follow-up of her obesity related diagnoses. Kathryn Munoz is on the Category 3 Plan and states she is following her eating plan approximately 75% of the time. Kathryn Munoz states she is walking her dog 15-20 minutes 3-5 times per week.  Today's visit was #: 17 Starting weight: 252 lbs Starting date: 07/17/2019 Today's weight: 233 lbs Today's date: 06/14/2020 Total lbs lost to date: 19 lbs Total lbs lost since last in-office visit: 3 lbs  Interim History: In the next 4 weeks, Kathryn Munoz will celebrate [facility] basketball in the final 4 and a couples trip to [facility]. She is pleased with her steady weight loss. She is down 3 lbs this OV for a total of 19 lbs! She is on Saxenda 1.2 mg QD. She reports a decrease in constipation with Linzess.   Subjective:   1. Insulin resistance Kathryn Munoz's 05/04/2020 BG 130, A1c 5.0, and insulin level 46.7. She is on GLP-1, Saxenda (Obesity treatment).  Lab Results  Component Value Date   INSULIN 46.7 (H) 05/04/2020   INSULIN 29.0 (H) 01/07/2020   INSULIN 32.5 (H) 10/14/2019   INSULIN 19.6 07/17/2019   Lab Results  Component Value Date   HGBA1C 5.0 05/04/2020    2. Vitamin D deficiency Kathryn Munoz's Vitamin D level was 37.7 on 05/04/2020, which is below goal of 50. She is currently taking prescription vitamin D 50,000 IU each week. She denies nausea, vomiting or muscle weakness.  3. At risk for osteoporosis Kathryn Munoz is at higher risk of osteopenia and osteoporosis due to Vitamin D deficiency and obesity.  Assessment/Plan:   1. Insulin resistance Kathryn Munoz will continue to work on weight loss, exercise, and decreasing simple carbohydrates to help decrease the risk of diabetes. Kathryn Munoz agreed to follow-up with [place] as directed to closely monitor her progress. Continue GLP-1 and exercise.  2. Vitamin D deficiency Low Vitamin D level contributes to fatigue and  are associated with obesity, breast, and colon cancer. She agrees to continue to take prescription Vitamin D @50 ,000 IU every week and will follow-up for routine testing of Vitamin D, at least 2-3 times per year to avoid over-replacement.  - Vitamin D, Ergocalciferol, (DRISDOL) 1.25 MG (50000 UNIT) CAPS capsule; Take 1 capsule (50,000 Units total) by mouth every 7 (seven) days.  Dispense: 4 capsule; Refill: 0  3. At risk for osteoporosis Kathryn Munoz was given approximately 15 minutes of osteoporosis prevention counseling today. Kathryn Munoz is at risk for osteopenia and osteoporosis due to her Vitamin D deficiency. She was encouraged to take her Vitamin D and follow her higher calcium diet and increase strengthening exercise to help strengthen her bones and decrease her risk of osteopenia and osteoporosis.  Repetitive spaced learning was employed today to elicit superior memory formation and behavioral change.  4. Current BMI 38.8 Kathryn Munoz is currently in the action stage of change. As such, her goal is to continue with weight loss efforts. She has agreed to the Category 3 Plan.   - Insulin Pen Needle 32G X 6 MM MISC; Will use with Saxenda daily  Dispense: 100 each; Refill: 0  Exercise goals: As is  Behavioral modification strategies: increasing lean protein intake, increasing water intake, meal planning and cooking strategies, travel eating strategies, celebration eating strategies and planning for success.  Kathryn Munoz has agreed to follow-up with our clinic in 4 weeks. She was informed of the importance of frequent follow-up visits to maximize  her success with intensive lifestyle modifications for her multiple health conditions.   Objective:   Blood pressure 114/75, pulse 84, temperature 98.6 F (37 C), height 5\' 5"  (1.651 m), weight 233 lb (105.7 kg), SpO2 97 %. Body mass index is 38.77 kg/m.  General: Cooperative, alert, well developed, in no acute distress. HEENT: Conjunctivae and lids  unremarkable. Cardiovascular: Regular rhythm.  Lungs: Normal work of breathing. Neurologic: No focal deficits.   Lab Results  Component Value Date   CREATININE 0.97 05/04/2020   BUN 10 05/04/2020   NA 141 05/04/2020   K 4.5 05/04/2020   CL 104 05/04/2020   CO2 22 05/04/2020   Lab Results  Component Value Date   ALT 7 05/04/2020   AST 14 05/04/2020   ALKPHOS 61 05/04/2020   BILITOT 0.4 05/04/2020   Lab Results  Component Value Date   HGBA1C 5.0 05/04/2020   HGBA1C 5.2 01/07/2020   HGBA1C 5.2 10/14/2019   HGBA1C 5.5 07/17/2019   HGBA1C 5.7 (H) 03/31/2019   Lab Results  Component Value Date   INSULIN 46.7 (H) 05/04/2020   INSULIN 29.0 (H) 01/07/2020   INSULIN 32.5 (H) 10/14/2019   INSULIN 19.6 07/17/2019   Lab Results  Component Value Date   TSH 3.800 05/04/2020   Lab Results  Component Value Date   CHOL 176 05/04/2020   HDL 61 05/04/2020   LDLCALC 93 05/04/2020   TRIG 123 05/04/2020   CHOLHDL 3.7 03/31/2019   Lab Results  Component Value Date   WBC 6.7 05/14/2020   HGB 12.3 05/14/2020   HCT 37.3 05/14/2020   MCV 94.2 05/14/2020   PLT 349 05/14/2020    Attestation Statements:   Reviewed by clinician on day of visit: allergies, medications, problem list, medical history, surgical history, family history, social history, and previous encounter notes.  05/16/2020, am acting as Edmund Hilda for Energy manager, NP.  I have reviewed the above documentation for accuracy and completeness, and I agree with the above. -  Yanis Juma d. Shley Dolby, NP-C

## 2020-07-03 ENCOUNTER — Other Ambulatory Visit (INDEPENDENT_AMBULATORY_CARE_PROVIDER_SITE_OTHER): Payer: Self-pay | Admitting: Adult Health

## 2020-07-03 DIAGNOSIS — Z6839 Body mass index (BMI) 39.0-39.9, adult: Secondary | ICD-10-CM

## 2020-07-04 ENCOUNTER — Other Ambulatory Visit (INDEPENDENT_AMBULATORY_CARE_PROVIDER_SITE_OTHER): Payer: Self-pay | Admitting: Adult Health

## 2020-07-04 DIAGNOSIS — E038 Other specified hypothyroidism: Secondary | ICD-10-CM

## 2020-07-05 NOTE — Telephone Encounter (Signed)
Kathryn Munoz's pt refill request.

## 2020-07-05 NOTE — Telephone Encounter (Signed)
Pt last seen by Katy Danford, FNP.  

## 2020-07-05 NOTE — Telephone Encounter (Signed)
Refill request Katy's patient.

## 2020-07-08 ENCOUNTER — Telehealth: Payer: Self-pay | Admitting: Psychiatry

## 2020-07-14 ENCOUNTER — Other Ambulatory Visit: Payer: Self-pay

## 2020-07-14 ENCOUNTER — Ambulatory Visit (INDEPENDENT_AMBULATORY_CARE_PROVIDER_SITE_OTHER): Payer: BC Managed Care – PPO | Admitting: Licensed Clinical Social Worker

## 2020-07-14 DIAGNOSIS — F3176 Bipolar disorder, in full remission, most recent episode depressed: Secondary | ICD-10-CM | POA: Diagnosis not present

## 2020-07-14 NOTE — Progress Notes (Signed)
Virtual Visit via Video Note  I connected with Kathryn Munoz on 07/14/20 at  1:00 PM EDT by a video enabled telemedicine application and verified that I am speaking with the correct person using two identifiers.  Location: Patient: home Provider: [facility]   I discussed the limitations of evaluation and management by telemedicine and the availability of in person appointments. The patient expressed understanding and agreed to proceed.    I discussed the assessment and treatment plan with the patient. The patient was provided an opportunity to ask questions and all were answered. The patient agreed with the plan and demonstrated an understanding of the instructions.   The patient was advised to call back or seek an in-person evaluation if the symptoms worsen or if the condition fails to improve as anticipated.  I provided 45 minutes of non-face-to-face time during this encounter.   Kathryn Munoz R Kathryn Deutscher, LCSW    THERAPIST PROGRESS NOTE  Session Time: 2-2:45p  Participation Level: Active  Behavioral Response: Neat and Well GroomedAlertEuthymic  Type of Therapy: Individual Therapy  Treatment Goals addressed: Communication: partner  Interventions: CBT and Supportive  Summary: Kathryn Munoz is a 45 y.o. female who presents with improving symptoms related to bipolar disorder diagnosis. Pt reports mood is stable and that she is managing situational stressors better.  Allowed pt to share thoughts and feelings related to recent life events--pt recently took trip to [place] and shared her experience there. Pt went with husband and feels like it was a bonding experience for the two of them. Explored recent relationship concerns and ways that pt could help improve communication skills. Discussed emotional versus physical intimacy. Discussed "the five love languages"  Explored pts improved overall self awareness and emotion regulation. Pt is able to reflect coping and management skills that  are working for her.  Continued recommendations are as follows: self care behaviors, positive social engagements, focusing on overall work/home/life balance, and focusing on positive physical and emotional wellness.   Suicidal/Homicidal: No  Therapist Response: Kathryn Munoz is able to identify and verbalize coping skills that have worked in the past and continues to utilize to manage symptoms. This is reflective of overall personal growth and progress. Treatment to continue  Plan: Return again in 4 weeks.  Diagnosis: Axis I: Bipolar, Depressed    Axis II: No diagnosis    Ernest Haber Kathryn [name], LCSW 07/14/2020

## 2020-07-19 ENCOUNTER — Other Ambulatory Visit: Payer: Self-pay

## 2020-07-19 ENCOUNTER — Ambulatory Visit (INDEPENDENT_AMBULATORY_CARE_PROVIDER_SITE_OTHER): Payer: BC Managed Care – PPO | Admitting: Adult Health

## 2020-07-19 ENCOUNTER — Encounter (INDEPENDENT_AMBULATORY_CARE_PROVIDER_SITE_OTHER): Payer: Self-pay | Admitting: Adult Health

## 2020-07-19 VITALS — BP 112/76 | HR 86 | Temp 98.2°F | Ht 65.0 in | Wt 237.0 lb

## 2020-07-19 DIAGNOSIS — E559 Vitamin D deficiency, unspecified: Secondary | ICD-10-CM | POA: Diagnosis not present

## 2020-07-19 DIAGNOSIS — Z6839 Body mass index (BMI) 39.0-39.9, adult: Secondary | ICD-10-CM

## 2020-07-19 DIAGNOSIS — K5903 Drug induced constipation: Secondary | ICD-10-CM | POA: Diagnosis not present

## 2020-07-19 DIAGNOSIS — Z9189 Other specified personal risk factors, not elsewhere classified: Secondary | ICD-10-CM

## 2020-07-19 MED ORDER — VITAMIN D (ERGOCALCIFEROL) 1.25 MG (50000 UNIT) PO CAPS: 50000.0000 [IU] | ORAL_CAPSULE | ORAL | 0 refills | Status: DC

## 2020-07-19 MED ORDER — LINACLOTIDE 145 MCG PO CAPS
ORAL_CAPSULE | ORAL | 0 refills | Status: DC
Start: 2020-07-19 — End: 2020-08-05

## 2020-07-20 NOTE — Progress Notes (Signed)
Chief Complaint:   OBESITY Kathryn Munoz is here to discuss her progress with her obesity treatment plan along with follow-up of her obesity related diagnoses. [name] is on the Category 3 Plan and states she is following her eating plan approximately 0% of the time. [name] states she is not currently exercising.  Today's visit was #: 18 Starting weight: 252 lbs Starting date: 07/17/2019 Today's weight: 237 lbs Today's date: 07/19/2020 Total lbs lost to date: 15 Total lbs lost since last in-office visit: 0  Interim History: Kathryn Munoz traveled to [facility] for 5 days on a couples trips trip. She focused on portion control and healthiest food options. She participated in "[employer]" (eating in pitch darkness).  She enjoyed seafood bisque, wanton, beef patty, crab cake, and dessert at the meal. They walked >18,000 steps a day while in [place].  Subjective:   1. Vitamin D deficiency Kathryn Munoz Vitamin D level was 37.7 on 05/04/2020, which is below goal of 50.Kathryn Munoz She is currently taking prescription vitamin D 50,000 IU each week. She denies nausea, vomiting or muscle weakness.  2. Drug-induced constipation Kathryn Munoz reports daily BM with Linzess 145 mg QD. She denies abdominal pain or loose stools.  3. At risk for dehydration Kathryn Munoz is at risk for dehydration due to limited water intake on weekends.  Assessment/Plan:   1. Vitamin D deficiency Low Vitamin D level contributes to fatigue and are associated with obesity, breast, and colon cancer. She agrees to continue to take prescription Vitamin D @50 ,000 IU every week and will follow-up for routine testing of Vitamin D, at least 2-3 times per year to avoid over-replacement.  - Vitamin D, Ergocalciferol, (DRISDOL) 1.25 MG (50000 UNIT) CAPS capsule; Take 1 capsule (50,000 Units total) by mouth every 7 (seven) days.  Dispense: 4 capsule; Refill: 0  2. Drug-induced constipation Jupiter was informed that a decrease in bowel movement frequency  is normal while losing weight, but stools should not be hard or painful. Orders and follow up as documented in patient record.   Counseling Getting to Good Bowel Health: Your goal is to have one soft bowel movement each day. Drink at least 8 glasses of water each day. Eat plenty of fiber (goal is over 25 grams each day). It is best to get most of your fiber from dietary sources which includes leafy green vegetables, fresh fruit, and whole grains. You may need to add fiber with the help of OTC fiber supplements. These include Metamucil, Citrucel, and Flaxseed. If you are still having trouble, try adding Miralax or Magnesium Citrate. If all of these changes do not work, Myer Peer.  - linaclotide (LINZESS) 145 MCG CAPS capsule; TAKE 1 CAPSULE BY MOUTH EVERY DAY BEFORE BREAKFAST  Dispense: 30 capsule; Refill: 0  3. At risk for dehydration Kathryn Munoz was given approximately 15 minutes dehydration prevention counseling today. Kathryn Munoz is at risk for dehydration due to weight loss and current medication(s). She was encouraged to hydrate and monitor fluid status to avoid dehydration as well as weight loss plateaus.   4. Class 2 severe obesity with serious comorbidity and body mass index (BMI) of 39.0 to 39.9 in adult, unspecified obesity type [facility]) Yvonda is currently in the action stage of change. As such, her goal is to continue with weight loss efforts. She has agreed to the Category 3 Plan.   Fasting labs at next OV.  Exercise goals: No exercise has been prescribed at this time.  Behavioral modification strategies: increasing lean protein  intake, increasing water intake, meal planning and cooking strategies and planning for success.  Kathryn Munoz has agreed to follow-up with our clinic in 3 weeks. She was informed of the importance of frequent follow-up visits to maximize her success with intensive lifestyle modifications for her multiple health conditions.   Objective:   Blood pressure  112/76, pulse 86, temperature 98.2 F (36.8 C), height 5\' 5"  (1.651 m), weight 237 lb (107.5 kg), SpO2 96 %. Body mass index is 39.44 kg/m.  General: Cooperative, alert, well developed, in no acute distress. HEENT: Conjunctivae and lids unremarkable. Cardiovascular: Regular rhythm.  Lungs: Normal work of breathing. Neurologic: No focal deficits.   Lab Results  Component Value Date   CREATININE 0.97 05/04/2020   BUN 10 05/04/2020   NA 141 05/04/2020   K 4.5 05/04/2020   CL 104 05/04/2020   CO2 22 05/04/2020   Lab Results  Component Value Date   ALT 7 05/04/2020   AST 14 05/04/2020   ALKPHOS 61 05/04/2020   BILITOT 0.4 05/04/2020   Lab Results  Component Value Date   HGBA1C 5.0 05/04/2020   HGBA1C 5.2 01/07/2020   HGBA1C 5.2 10/14/2019   HGBA1C 5.5 07/17/2019   HGBA1C 5.7 (H) 03/31/2019   Lab Results  Component Value Date   INSULIN 46.7 (H) 05/04/2020   INSULIN 29.0 (H) 01/07/2020   INSULIN 32.5 (H) 10/14/2019   INSULIN 19.6 07/17/2019   Lab Results  Component Value Date   TSH 3.800 05/04/2020   Lab Results  Component Value Date   CHOL 176 05/04/2020   HDL 61 05/04/2020   LDLCALC 93 05/04/2020   TRIG 123 05/04/2020   CHOLHDL 3.7 03/31/2019   Lab Results  Component Value Date   WBC 6.7 05/14/2020   HGB 12.3 05/14/2020   HCT 37.3 05/14/2020   MCV 94.2 05/14/2020   PLT 349 05/14/2020    Attestation Statements:   Reviewed by clinician on day of visit: allergies, medications, problem list, medical history, surgical history, family history, social history, and previous encounter notes.  05/16/2020, am acting as Edmund Hilda for Energy manager, NP.  I have reviewed the above documentation for accuracy and completeness, and I agree with the above. -  Solange Emry d. Zay Yeargan, NP-C

## 2020-07-22 ENCOUNTER — Other Ambulatory Visit: Payer: Self-pay | Admitting: Psychiatry

## 2020-07-22 DIAGNOSIS — F3176 Bipolar disorder, in full remission, most recent episode depressed: Secondary | ICD-10-CM

## 2020-07-27 ENCOUNTER — Other Ambulatory Visit: Payer: Self-pay

## 2020-07-27 ENCOUNTER — Encounter: Payer: Self-pay | Admitting: Psychiatry

## 2020-07-27 ENCOUNTER — Ambulatory Visit (INDEPENDENT_AMBULATORY_CARE_PROVIDER_SITE_OTHER): Payer: BC Managed Care – PPO | Admitting: Psychiatry

## 2020-07-27 VITALS — BP 116/78 | HR 90 | Temp 99.1°F | Ht 65.0 in | Wt 246.0 lb

## 2020-07-27 DIAGNOSIS — F5105 Insomnia due to other mental disorder: Secondary | ICD-10-CM | POA: Diagnosis not present

## 2020-07-27 DIAGNOSIS — F429 Obsessive-compulsive disorder, unspecified: Secondary | ICD-10-CM

## 2020-07-27 DIAGNOSIS — F3176 Bipolar disorder, in full remission, most recent episode depressed: Secondary | ICD-10-CM

## 2020-07-27 MED ORDER — CITALOPRAM HYDROBROMIDE 40 MG PO TABS: 20.0000 mg | ORAL_TABLET | Freq: Every day | ORAL | 1 refills | Status: DC

## 2020-07-27 MED ORDER — LAMOTRIGINE 150 MG PO TABS: 150.0000 mg | ORAL_TABLET | Freq: Every day | ORAL | 0 refills | Status: DC

## 2020-07-27 MED ORDER — QUETIAPINE FUMARATE 25 MG PO TABS: 25.0000 mg | ORAL_TABLET | Freq: Every day | ORAL | 0 refills | Status: DC

## 2020-07-27 NOTE — Progress Notes (Signed)
BH MD OP Progress Note  07/27/2020 3:53 PM SKYA MCCULLUM  MRN:  [id number]  Chief Complaint:  Chief Complaint    Follow-up; Anxiety; Depression     HPI: Kathryn Munoz is a 45 year old African-American female, lives in [place], has a history of bipolar disorder, insomnia, OCD, insulin resistance, hypothyroidism, other specified eating disorder was evaluated in office today.  Patient reports she is overall doing well.  Denies any significant mood lability.  She denies any manic episodes or depressive episodes.  She reports she does have OCD symptoms when she has racing thoughts about what she may have said in her conversations with others or obsesses about certain actions.  She reports she is currently working with her therapist.  She likes her therapist and reports she is motivated to continue therapy.  She reports sleep is good.  She reports she just returned from [facility] and is currently struggling with jet lag although improving.  Patient reports even though she was advised to take Lamictal 200 mg several months ago she has been taking only Lamictal 150 mg daily.  She wanted to clarify that with writer today and reports she is doing well with that dosage.  She reports she does struggle with sexual dysfunction and would like to know if one of her medications could be contributing to that.  Patient denies any suicidality, homicidality or perceptual disturbances.  Patient denies any other concerns today.  Visit Diagnosis:    ICD-10-CM   1. Bipolar disorder, in full remission, most recent episode depressed (HCC)  F31.76 lamoTRIgine (LAMICTAL) 150 MG tablet    QUEtiapine (SEROQUEL) 25 MG tablet  2. Insomnia due to mental condition  F51.05   3. 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  F42.9 citalopram (CELEXA) 40 MG tablet    Past Psychiatric History: I have reviewed past psychiatric history from progress note on 11/08/2017.  Past trials of Celexa.  Patient with history  of suicide attempt at the age of 22 when she overdosed on pills was admitted to the hospital.  She reports another episode of overdose at the age of 33 however she slept through it and did not tell anyone.  Past Medical History:  Past Medical History:  Diagnosis Date  . Abnormal mammogram    breast biopsy, [facility] 2014  . ADD (attention deficit disorder)   . Anxiety   . Bipolar 2 disorder (HCC)   . Constipation   . Depression   . Dermatitis   . Heavy menstrual bleeding   . History of gestational diabetes   . History of kidney stones 2014  . Hypothyroidism   . IFG (impaired fasting glucose)   . Insulin resistance   . Irregular periods   . Joint pain   . Multiple food allergies    Dates and nuts  . Neuropathy   . Obesity   . OCD (obsessive compulsive disorder)   . Prediabetes   . Vitamin D deficiency disease     Past Surgical History:  Procedure Laterality Date  . BREAST BIOPSY Left 2014   [place] bx/clip-neg  . INTRAUTERINE DEVICE INSERTION  03/11/2012    Family Psychiatric History: I have reviewed family psychiatric history from progress note on 11/08/2017.  Family History:  Family History  Problem Relation Age of Onset  . Diabetes Mother   . Pancreatitis Mother   . Hypertension Mother   . Alcohol abuse Mother   . Schizophrenia Mother   . Depression Mother   . Heart disease  Mother   . Stroke Mother   . Thyroid disease Mother   . Kidney disease Mother   . Anxiety disorder Mother   . Alcohol abuse Father   . Diabetes Sister        pre-diabetic  . Diabetes Brother   . Drug abuse Brother   . ADD / ADHD Son   . Eczema Son   . Breast cancer Cousin        pat cousin  . Anxiety disorder Other   . Ovarian cancer Neg Hx     Social History: Reviewed social history from progress note on 11/08/2017. Social History   Socioeconomic History  . Marital status: Married    Spouse name: Synia Douglass  . Number of children: 2  . Years of education: Not on file  . Highest  education level: Bachelor's degree (e.g., BA, AB, BS)  Occupational History  . Occupation: Charity fundraiser  Tobacco Use  . Smoking status: Never Smoker  . Smokeless tobacco: Never Used  Vaping Use  . Vaping Use: Never used  Substance and Sexual Activity  . Alcohol use: No  . Drug use: No  . Sexual activity: Yes    Partners: Male    Birth control/protection: Other-see comments    Comment: nuvaring  Other Topics Concern  . Not on file  Social History Narrative  . Not on file   Social Determinants of Health   Financial Resource Strain: Not on file  Food Insecurity: Not on file  Transportation Needs: Not on file  Physical Activity: Not on file  Stress: Not on file  Social Connections: Not on file    Allergies:  Allergies  Allergen Reactions  . Date Seed Extract  [Zizyphus Jujuba] Anaphylaxis, Cough, Itching and Swelling  . Other Swelling    Metabolic Disorder Labs: Lab Results  Component Value Date   HGBA1C 5.0 05/04/2020   MPG 117 03/31/2019   MPG 91 05/09/2018   No results found for: PROLACTIN Lab Results  Component Value Date   CHOL 176 05/04/2020   TRIG 123 05/04/2020   HDL 61 05/04/2020   CHOLHDL 3.7 03/31/2019   VLDL 14 10/16/2016   LDLCALC 93 05/04/2020   LDLCALC 113 (H) 10/14/2019   Lab Results  Component Value Date   TSH 3.800 05/04/2020   TSH 2.210 07/17/2019    Therapeutic Level Labs: No results found for: LITHIUM No results found for: VALPROATE No components found for:  CBMZ  Current Medications: Current Outpatient Medications  Medication Sig Dispense Refill  . EPINEPHrine 0.3 mg/0.3 mL IJ SOAJ injection INJECT 0.3 MLS INTO THE MUSCLE ONCE FOR 1 DOSE. FOR LIFE-THREATENING ALLERGIC REACTION / ANAPHYLAXIS  1  . etonogestrel-ethinyl estradiol (NUVARING) 0.12-0.015 MG/24HR vaginal ring INSERT 1 RING VAGINALLY AS DIRECTED. REMOVE AFTER 3 WEEKS & WAIT 7 DAYS BEFORE INSERTING A NEW RING 3 each 3  . Insulin Pen Needle 32G X 6 MM MISC Will use with  Saxenda daily 100 each 0  . lamoTRIgine (LAMICTAL) 150 MG tablet Take 1 tablet (150 mg total) by mouth daily. 90 tablet 0  . levothyroxine (SYNTHROID) 75 MCG tablet TAKE 1 TABLET BY MOUTH EVERY DAY BEFORE BREAKFAST 30 tablet 0  . linaclotide (LINZESS) 145 MCG CAPS capsule TAKE 1 CAPSULE BY MOUTH EVERY DAY BEFORE BREAKFAST 30 capsule 0  . metFORMIN (GLUCOPHAGE-XR) 500 MG 24 hr tablet TAKE 3 TABLETS (1,500 MG TOTAL) BY MOUTH DAILY. 270 tablet 3  . polyethylene glycol (MIRALAX / GLYCOLAX) packet Take 17 g by  mouth as needed.     Marland Kitchen. QUEtiapine (SEROQUEL) 25 MG tablet Take 1 tablet (25 mg total) by mouth at bedtime. 90 tablet 0  . SAXENDA 18 MG/3ML SOPN INJECT 1.2 MG UNDER THE SKIN ONCE DAILY 15 mL 0  . traZODone (DESYREL) 50 MG tablet TAKE 1 AND 1/2 TO 2 TABLETS (75-100 MG TOTAL) BY MOUTH AT BEDTIME AS NEEDED FOR SLEEP. FOR SLEEP 180 tablet 1  . Vitamin D, Ergocalciferol, (DRISDOL) 1.25 MG (50000 UNIT) CAPS capsule Take 1 capsule (50,000 Units total) by mouth every 7 (seven) days. 4 capsule 0  . citalopram (CELEXA) 40 MG tablet Take 0.5 tablets (20 mg total) by mouth daily. 90 tablet 1   No current facility-administered medications for this visit.     Musculoskeletal: Strength & Muscle Tone: UTA Gait & Station: normal Patient leans: N/A  Psychiatric Specialty Exam: Review of Systems  Constitutional: Positive for fatigue.  Endocrine:       Loss of libido  Psychiatric/Behavioral: The patient is nervous/anxious.   All other systems reviewed and are negative.   Blood pressure 116/78, pulse 90, temperature 99.1 F (37.3 C), height 5\' 5"  (1.651 m), weight 246 lb (111.6 kg), SpO2 97 %.Body mass index is 40.94 kg/m.  General Appearance: Casual  Eye Contact:  Fair  Speech:  Clear and Coherent  Volume:  Normal  Mood:  Anxious coping well  Affect:  Congruent  Thought Process:  Goal Directed and Descriptions of Associations: Intact  Orientation:  Full (Time, Place, and Person)  Thought  Content: Obsessions   Suicidal Thoughts:  No  Homicidal Thoughts:  No  Memory:  Immediate;   Fair Recent;   Fair Remote;   Fair  Judgement:  Fair  Insight:  Fair  Psychomotor Activity:  Normal  Concentration:  Concentration: Fair and Attention Span: Fair  Recall:  Fis[employer]Fair  Fund of Knowledge: Fair  Language: Fair  Akathisia:  No  Handed:  Right  AIMS (if indicated): Done  Assets:  Communication Skills Desire for Improvement Housing Social Support  ADL's:  Intact  Cognition: WNL  Sleep:  Fair   Screenings: GAD-7   Flowsheet Row [facility]  Total GAD-7 Score 4    PHQ2-9   Flowsheet Row [facility] Counselor from [facility] Counselor from 06/10/2020 [facility] Office Visit [facility] Video Visit from [facility]  PHQ-2 Total Score 0 0 0 2 0  PHQ-9 Total Score 6 -- -- 7 --    Flowsheet Row [facility] Counsel[facility] Counselor from 06[facility]10/2020 in [facility]  C-SSRS RISK CATEGORY Low Risk No Risk No Risk       Assessment and Plan: Myra Rudeakisha S Venters is a 45 year old African-American female, married, employed, lives in [place], has a history of bipolar disorder, sleep problems, OCD, hypothyroidism was evaluated in office today.  Patient is currently struggling with possible adverse side effects to her medication.  Plan as noted below.  Plan Bipolar disorder in remission Lamictal 150 mg p.o. daily.  Patient reports she has been taking only the 150 mg even though dosage was increased several months ago. Reduce Celexa to 20 mg p.o. daily to address sexual dysfunction. Continue Seroquel 25  mg p.o. nightly at reduced dosage. Continue trazodone  50 mg p.o. nightly as needed  OCD- improving Patient advised to continue psychotherapy sessions.  She will discuss with Ms. Christina Hussami Discussed ERP. Discussed OCD groups. Continue Celexa as prescribed Continue Seroquel 25 mg p.o. nightly  Insomnia-improving Trazodone as prescribed  Patient also advised to follow-up with primary care provider to discuss her sexual dysfunction.  Follow-up in clinic in 7 weeks or sooner if needed.  This note was generated in part or whole with voice recognition software. Voice recognition is usually quite accurate but there are transcription errors that can and very often do occur. I apologize for any typographical errors that were not detected and corrected.      Jomarie Longs, MD 07/29/2020, 8:27 AM

## 2020-08-01 ENCOUNTER — Other Ambulatory Visit (INDEPENDENT_AMBULATORY_CARE_PROVIDER_SITE_OTHER): Payer: Self-pay | Admitting: Family Medicine

## 2020-08-01 DIAGNOSIS — E038 Other specified hypothyroidism: Secondary | ICD-10-CM

## 2020-08-02 NOTE — Telephone Encounter (Signed)
Pt last seen by Katy Danford, FNP.  

## 2020-08-05 ENCOUNTER — Other Ambulatory Visit: Payer: Self-pay

## 2020-08-05 ENCOUNTER — Encounter (INDEPENDENT_AMBULATORY_CARE_PROVIDER_SITE_OTHER): Payer: Self-pay | Admitting: Adult Health

## 2020-08-05 ENCOUNTER — Ambulatory Visit (INDEPENDENT_AMBULATORY_CARE_PROVIDER_SITE_OTHER): Payer: BC Managed Care – PPO | Admitting: Adult Health

## 2020-08-05 VITALS — BP 106/71 | HR 80 | Temp 98.1°F | Ht 65.0 in | Wt 237.0 lb

## 2020-08-05 DIAGNOSIS — E66812 Obesity, class 2: Secondary | ICD-10-CM

## 2020-08-05 DIAGNOSIS — Z9189 Other specified personal risk factors, not elsewhere classified: Secondary | ICD-10-CM

## 2020-08-05 DIAGNOSIS — E8881 Metabolic syndrome: Secondary | ICD-10-CM

## 2020-08-05 DIAGNOSIS — K5903 Drug induced constipation: Secondary | ICD-10-CM | POA: Diagnosis not present

## 2020-08-05 DIAGNOSIS — E559 Vitamin D deficiency, unspecified: Secondary | ICD-10-CM

## 2020-08-05 DIAGNOSIS — E88819 Insulin resistance, unspecified: Secondary | ICD-10-CM

## 2020-08-05 DIAGNOSIS — Z6839 Body mass index (BMI) 39.0-39.9, adult: Secondary | ICD-10-CM

## 2020-08-05 MED ORDER — VITAMIN D (ERGOCALCIFEROL) 1.25 MG (50000 UNIT) PO CAPS
50000.0000 [IU] | ORAL_CAPSULE | ORAL | 0 refills | Status: DC
Start: 2020-08-05 — End: 2020-09-01

## 2020-08-05 MED ORDER — LINACLOTIDE 145 MCG PO CAPS: ORAL_CAPSULE | ORAL | 0 refills | Status: DC

## 2020-08-06 ENCOUNTER — Other Ambulatory Visit (INDEPENDENT_AMBULATORY_CARE_PROVIDER_SITE_OTHER): Payer: Self-pay | Admitting: Family Medicine

## 2020-08-06 LAB — COMPREHENSIVE METABOLIC PANEL
ALT: 8 IU/L (ref 0–32)
AST: 13 IU/L (ref 0–40)
Albumin/Globulin Ratio: 2.1 (ref 1.2–2.2)
Albumin: 4.7 g/dL (ref 3.8–4.8)
Alkaline Phosphatase: 60 IU/L (ref 44–121)
BUN/Creatinine Ratio: 9 (ref 9–23)
BUN: 8 mg/dL (ref 6–24)
Bilirubin Total: 0.5 mg/dL (ref 0.0–1.2)
CO2: 23 mmol/L (ref 20–29)
Calcium: 9.5 mg/dL (ref 8.7–10.2)
Chloride: 105 mmol/L (ref 96–106)
Creatinine, Ser: 0.91 mg/dL (ref 0.57–1.00)
Globulin, Total: 2.2 g/dL (ref 1.5–4.5)
Glucose: 99 mg/dL (ref 65–99)
Potassium: 4.6 mmol/L (ref 3.5–5.2)
Sodium: 142 mmol/L (ref 134–144)
Total Protein: 6.9 g/dL (ref 6.0–8.5)
eGFR: 80 mL/min/{1.73_m2} (ref >59)

## 2020-08-06 LAB — INSULIN, RANDOM: INSULIN: 42.7 u[IU]/mL — ABNORMAL HIGH (ref 2.6–24.9)

## 2020-08-06 LAB — VITAMIN D 25 HYDROXY (VIT D DEFICIENCY, FRACTURES): Vit D, 25-Hydroxy: 49.6 ng/mL (ref 30.0–100.0)

## 2020-08-06 LAB — HEMOGLOBIN A1C
Est. average glucose Bld gHb Est-mCnc: 105 mg/dL
Hgb A1c MFr Bld: 5.3 % (ref 4.8–5.6)

## 2020-08-09 NOTE — Telephone Encounter (Signed)
Pt last seen by Katy Danford, FNP.  

## 2020-08-09 NOTE — Progress Notes (Signed)
Chief Complaint:   OBESITY Kathryn Munoz is here to discuss her progress with her obesity treatment plan along with follow-up of her obesity related diagnoses. [name] is on the Category 3 Plan and states she is following her eating plan approximately 60% of the time. Kathryn Munoz states she is not currently exercising.  Today's visit was #: 19 Starting weight: 252 lbs Starting date: 07/17/2019 Today's weight: 237 lbs Today's date: 08/05/2020 Total lbs lost to date: 15 Total lbs lost since last in-office visit: 0  Interim History: The last several weeks, Kathryn Munoz celebrated Mother's Day and several birthdays. She was able to make healthier food choices at these events.  Per pt- her insurance will not cover Saxenda refill. She has one box of Saxenda at home. She is on Saxenda 1.2 mg QD.  Subjective:   1. Vitamin D deficiency Kathryn Munoz's Vitamin D level was 37.7 on 05/04/2020. She is currently taking prescription vitamin D 50,000 IU each week. She denies nausea, vomiting or muscle weakness.  2. Drug-induced constipation Kathryn Munoz will have BM with Linzess 145 mcg QD. She denies abdominal pain.  3. Insulin resistance Kathryn Munoz is on Metformin 500 mg TID, managed by her PCP. She is on Saxenda 1.2 mg QD, managed by [place]. -Per pt, her insurance will not cover refill of injectable GLP-1.  4. At risk for diabetes mellitus Kathryn Munoz is at higher than average risk for developing diabetes due to obesity and insulin resistance.  Assessment/Plan:   1. Vitamin D deficiency Low Vitamin D level contributes to fatigue and are associated with obesity, breast, and colon cancer. She agrees to continue to take prescription Vitamin D @50 ,000 IU every week and will follow-up for routine testing of Vitamin D, at least 2-3 times per year to avoid over-replacement. -Check labs today.  - Vitamin D, Ergocalciferol, (DRISDOL) 1.25 MG (50000 UNIT) CAPS capsule; Take 1 capsule (50,000 Units total) by mouth every 7 (seven)  days.  Dispense: 4 capsule; Refill: 0  - VITAMIN D 25 Hydroxy (Vit-D Deficiency, Fractures)  2. Drug-induced constipation Griffin was informed that a decrease in bowel movement frequency is normal while losing weight, but stools should not be hard or painful. Orders and follow up as documented in patient record.   Counseling Getting to Good Bowel Health: Your goal is to have one soft bowel movement each day. Drink at least 8 glasses of water each day. Eat plenty of fiber (goal is over 25 grams each day). It is best to get most of your fiber from dietary sources which includes leafy green vegetables, fresh fruit, and whole grains. You may need to add fiber with the help of OTC fiber supplements. These include Metamucil, Citrucel, and Flaxseed. If you are still having trouble, try adding Miralax or Magnesium Citrate. If all of these changes do not work, Myer Peer.  - linaclotide (LINZESS) 145 MCG CAPS capsule; TAKE 1 CAPSULE BY MOUTH EVERY DAY BEFORE BREAKFAST  Dispense: 30 capsule; Refill: 0  3. Insulin resistance Kathryn Munoz will continue to work on weight loss, exercise, and decreasing simple carbohydrates to help decrease the risk of diabetes. Kathryn Munoz agreed to follow-up with Myer Peer as directed to closely monitor her progress. -Continue Metformin as directed. Finish supply of Saxenda 1.2 mg QD.  -Check labs today. - Comprehensive metabolic panel - Hemoglobin A1c - Insulin, random  4. At risk for diabetes mellitus Kathryn Munoz was given approximately 15 minutes of diabetes education and counseling today. We discussed intensive lifestyle modifications today with an emphasis on  weight loss as well as increasing exercise and decreasing simple carbohydrates in her diet. We also reviewed medication options with an emphasis on risk versus benefit of those discussed.   Repetitive spaced learning was employed today to elicit superior memory formation and behavioral change.  5. Class 2 severe  obesity with serious comorbidity and body mass index (BMI) of 39.0 to 39.9 in adult, unspecified obesity type [facility]) Kathryn Munoz is currently in the action stage of change. As such, her goal is to continue with weight loss efforts. She has agreed to the Category 3 Plan.   Finish current supply of Saxenda 1.2 mg QD.  Exercise goals: Do 5 minutes of walkign 3 times a week.  Behavioral modification strategies: increasing lean protein intake, decreasing simple carbohydrates, meal planning and cooking strategies, better snacking choices and planning for success.  Kathryn Munoz has agreed to follow-up with our clinic in 3 weeks. She was informed of the importance of frequent follow-up visits to maximize her success with intensive lifestyle modifications for her multiple health conditions.   Kathryn Munoz was informed we would discuss her lab results at her next visit unless there is a critical issue that needs to be addressed sooner. Kathryn Munoz agreed to keep her next visit at the agreed upon time to discuss these results.  Objective:   Blood pressure 106/71, pulse 80, temperature 98.1 F (36.7 C), height 5\' 5"  (1.651 m), weight 237 lb (107.5 kg), SpO2 98 %. Body mass index is 39.44 kg/m.  General: Cooperative, alert, well developed, in no acute distress. HEENT: Conjunctivae and lids unremarkable. Cardiovascular: Regular rhythm.  Lungs: Normal work of breathing. Neurologic: No focal deficits.   Lab Results  Component Value Date   CREATININE 0.91 08/05/2020   BUN 8 08/05/2020   NA 142 08/05/2020   K 4.6 08/05/2020   CL 105 08/05/2020   CO2 23 08/05/2020   Lab Results  Component Value Date   ALT 8 08/05/2020   AST 13 08/05/2020   ALKPHOS 60 08/05/2020   BILITOT 0.5 08/05/2020   Lab Results  Component Value Date   HGBA1C 5.3 08/05/2020   HGBA1C 5.0 05/04/2020   HGBA1C 5.2 01/07/2020   HGBA1C 5.2 10/14/2019   HGBA1C 5.5 07/17/2019   Lab Results  Component Value Date   INSULIN 42.7 (H)  08/05/2020   INSULIN 46.7 (H) 05/04/2020   INSULIN 29.0 (H) 01/07/2020   INSULIN 32.5 (H) 10/14/2019   INSULIN 19.6 07/17/2019   Lab Results  Component Value Date   TSH 3.800 05/04/2020   Lab Results  Component Value Date   CHOL 176 05/04/2020   HDL 61 05/04/2020   LDLCALC 93 05/04/2020   TRIG 123 05/04/2020   CHOLHDL 3.7 03/31/2019   Lab Results  Component Value Date   WBC 6.7 05/14/2020   HGB 12.3 05/14/2020   HCT 37.3 05/14/2020   MCV 94.2 05/14/2020   PLT 349 05/14/2020     Attestation Statements:   Reviewed by clinician on day of visit: allergies, medications, problem list, medical history, surgical history, family history, social history, and previous encounter notes.  05/16/2020, CMA, am acting as transcriptionist for Edmund Hilda, NP.  I have reviewed the above documentation for accuracy and completeness, and I agree with the above. -  Gerritt Galentine d. Geovannie Vilar, NP-C

## 2020-08-24 ENCOUNTER — Other Ambulatory Visit: Payer: Self-pay

## 2020-08-24 ENCOUNTER — Ambulatory Visit (INDEPENDENT_AMBULATORY_CARE_PROVIDER_SITE_OTHER): Payer: BC Managed Care – PPO | Admitting: Licensed Clinical Social Worker

## 2020-08-24 DIAGNOSIS — F3176 Bipolar disorder, in full remission, most recent episode depressed: Secondary | ICD-10-CM

## 2020-08-24 NOTE — Progress Notes (Signed)
Virtual Visit via Video Note  I connected with Kathryn Munoz on 08/24/20 at  3:00 PM EDT by a video enabled telemedicine application and verified that I am speaking with the correct person using two identifiers.  Location: Patient: home Provider: remote office [place], [place])   I discussed the limitations of evaluation and management by telemedicine and the availability of in person appointments. The patient expressed understanding and agreed to proceed.  I discussed the assessment and treatment plan with the patient. The patient was provided an opportunity to ask questions and all were answered. The patient agreed with the plan and demonstrated an understanding of the instructions.   The patient was advised to call back or seek an in-person evaluation if the symptoms worsen or if the condition fails to improve as anticipated.  I provided 30 minutes of non-face-to-face time during this encounter.   Hatim Homann R Blaike Newburn, LCSW   THERAPIST PROGRESS NOTE  Session Time: 3-3:30p  Participation Level: Active  Behavioral Response: Neat and Well GroomedAlertEuthymic  Type of Therapy: Individual Therapy  Treatment Goals addressed: Coping  Interventions: CBT, Solution Focused and Strength-based  Summary: Kathryn Munoz is a 45 y.o. female who presents with improving symptoms related to bipolar disorder diagnosis. Pt reports that she is able to regulate mood and mood fluctuations better. Pt feels she is regulating emotions better than in the past. Pt calls it her "midlife awakening".    Allowed pt to explore and express thoughts and feelings associated with recent life situations and external stressors. Discussed pts recent trip with girlfriends and being more intentional about self care and mindfulness activities. Pt has another trip planned to go to [place] with friends on 23rd.   Pt shared situation where she was using her CBT coping skills to manage negative thoughts  triggered by a friend saying "no" when she asked for a ride to the airport. Pt did a great job of deconstructing and challenging her negative thoughts and using positive self talk to reframe.   Continued recommendations are as follows: self care behaviors, positive social engagements, focusing on overall work/home/life balance, and focusing on positive physical and emotional wellness.    Suicidal/Homicidal: No  Therapist Response: Kathryn Munoz is able to describe current and past experiences with fears, worries, anxiety, and mood triggers including impact on functioning and attempts to resolve it. Kathryn Munoz is able to identify coping skills that help manage mood and stress and utilize in the moment. These behaviors are reflective of both personal growth and progress. Treatment to continue as indicated.  Plan: Return again in 7 weeks.  Diagnosis: Axis I: Bipolar, Depressed    Axis II: No diagnosis    Ernest Haber Cotton Beckley, LCSW 08/24/2020

## 2020-09-01 ENCOUNTER — Other Ambulatory Visit: Payer: Self-pay | Admitting: Psychiatry

## 2020-09-01 ENCOUNTER — Encounter (INDEPENDENT_AMBULATORY_CARE_PROVIDER_SITE_OTHER): Payer: Self-pay | Admitting: Adult Health

## 2020-09-01 ENCOUNTER — Ambulatory Visit (INDEPENDENT_AMBULATORY_CARE_PROVIDER_SITE_OTHER): Payer: BC Managed Care – PPO | Admitting: Adult Health

## 2020-09-01 ENCOUNTER — Other Ambulatory Visit: Payer: Self-pay

## 2020-09-01 VITALS — BP 115/76 | HR 72 | Temp 98.3°F | Ht 65.0 in | Wt 239.0 lb

## 2020-09-01 DIAGNOSIS — K5903 Drug induced constipation: Secondary | ICD-10-CM

## 2020-09-01 DIAGNOSIS — E8881 Metabolic syndrome: Secondary | ICD-10-CM | POA: Diagnosis not present

## 2020-09-01 DIAGNOSIS — F3176 Bipolar disorder, in full remission, most recent episode depressed: Secondary | ICD-10-CM

## 2020-09-01 DIAGNOSIS — E559 Vitamin D deficiency, unspecified: Secondary | ICD-10-CM | POA: Diagnosis not present

## 2020-09-01 DIAGNOSIS — E038 Other specified hypothyroidism: Secondary | ICD-10-CM

## 2020-09-01 DIAGNOSIS — Z6841 Body Mass Index (BMI) 40.0 and over, adult: Secondary | ICD-10-CM

## 2020-09-01 DIAGNOSIS — Z9189 Other specified personal risk factors, not elsewhere classified: Secondary | ICD-10-CM

## 2020-09-01 MED ORDER — VITAMIN D (ERGOCALCIFEROL) 1.25 MG (50000 UNIT) PO CAPS: 50000.0000 [IU] | ORAL_CAPSULE | ORAL | 0 refills | Status: DC

## 2020-09-01 MED ORDER — LEVOTHYROXINE SODIUM 75 MCG PO TABS: ORAL_TABLET | ORAL | 0 refills | Status: DC

## 2020-09-01 MED ORDER — LINACLOTIDE 145 MCG PO CAPS: ORAL_CAPSULE | ORAL | 0 refills | Status: DC

## 2020-09-01 MED ORDER — INSULIN PEN NEEDLE 32G X 6 MM MISC: 0 refills | Status: DC

## 2020-09-01 MED ORDER — SAXENDA 18 MG/3ML ~~LOC~~ SOPN: PEN_INJECTOR | SUBCUTANEOUS | 1 refills | Status: DC

## 2020-09-06 NOTE — Progress Notes (Signed)
Chief Complaint:   OBESITY Kathryn Munoz is here to discuss her progress with her obesity treatment plan along with follow-up of her obesity related diagnoses. Kathryn Munoz is on the Category 3 Plan and states she is following her eating plan approximately 40% of the time. Kathryn Munoz states she is walking 15 minutes 3 times per week.  Today's visit was #: 20 Starting weight: 252 lbs Starting date: 07/17/2019 Today's weight: 239 lbs Today's date: 09/01/2020 Total lbs lost to date: 13 Total lbs lost since last in-office visit: 0  Interim History: Kathryn Munoz feels like she lost "my way" and is considering taking a break from the program. She reports feeling better when following category 3 consistently. She reports continued snacking on "sugary sweets". She reports her insurance company sent a letter stating Kathryn Munoz will now be covered- She is on 1.2 mg QD dose.   Subjective:   1. Vitamin D deficiency Discussed labs with patient today. Kathryn Munoz's Vitamin D level was 49.6 on 08/05/2020-- just below goal of 50. She is currently taking prescription vitamin D 50,000 IU each week. She denies nausea, vomiting or muscle weakness.  2. Other specified hypothyroidism Discussed labs with patient today. 05/04/2020 thyroid panel was stable. Kathryn Munoz denies increased fatigue levels. She is on levothyroxine 75 mcg QD.  Lab Results  Component Value Date   TSH 3.800 05/04/2020   3. Drug-induced constipation Kathryn Munoz is on Saxenda 1.2 mg QD. She will often experience constipation. Linzess 145 mcg will assist with more regular bowel movements.  4. Insulin resistance Discussed labs with patient today. 08/05/2020 BG 99 (improved), A1c 5.3 (stable), insulin level 42.7 (improved).  She is on Metformin 500 mg TID (PCP managed) and Saxenda 1.2 mg QD (managed by [place]).  Lab Results  Component Value Date   INSULIN 42.7 (H) 08/05/2020   INSULIN 46.7 (H) 05/04/2020   INSULIN 29.0 (H) 01/07/2020   INSULIN 32.5 (H) 10/14/2019    INSULIN 19.6 07/17/2019   Lab Results  Component Value Date   HGBA1C 5.3 08/05/2020   5. At risk for diabetes mellitus Jackalyn is at higher than average risk for developing diabetes due to obesity and insulin resistance.   Assessment/Plan:   1. Vitamin D deficiency Low Vitamin D level contributes to fatigue and are associated with obesity, breast, and colon cancer. She agrees to continue to take prescription Vitamin D @50 ,000 IU every week and will follow-up for routine testing of Vitamin D, at least 2-3 times per year to avoid over-replacement. -Last refill for Ergocalciferol 50,000 IU weekly, then change to Vit D3 2,000 IU QD.  - Vitamin D, Ergocalciferol, (DRISDOL) 1.25 MG (50000 UNIT) CAPS capsule; Take 1 capsule (50,000 Units total) by mouth every 7 (seven) days.  Dispense: 4 capsule; Refill: 0  2. Other specified hypothyroidism Patient with long-standing hypothyroidism, on levothyroxine therapy. She appears euthyroid. Orders and follow up as documented in patient record.  Counseling Good thyroid control is important for overall health. Supratherapeutic thyroid levels are dangerous and will not improve weight loss results. The correct way to take levothyroxine is fasting, with water, separated by at least 30 minutes from breakfast, and separated by more than 4 hours from calcium, iron, multivitamins, acid reflux medications (PPIs).    - levothyroxine (SYNTHROID) 75 MCG tablet; TAKE 1 TABLET BY MOUTH EVERY DAY BEFORE BREAKFAST  Dispense: 30 tablet; Refill: 0  3. Drug-induced constipation Kathryn Munoz was informed that a decrease in bowel movement frequency is normal while losing weight, but stools should not be  hard or painful. Orders and follow up as documented in patient record.   Counseling Getting to Good Bowel Health: Your goal is to have one soft bowel movement each day. Drink at least 8 glasses of water each day. Eat plenty of fiber (goal is over 25 grams each day). It is best  to get most of your fiber from dietary sources which includes leafy green vegetables, fresh fruit, and whole grains. You may need to add fiber with the help of OTC fiber supplements. These include Metamucil, Citrucel, and Flaxseed. If you are still having trouble, try adding Miralax or Magnesium Citrate. If all of these changes do not work, Dietitian.   - linaclotide (LINZESS) 145 MCG CAPS capsule; TAKE 1 CAPSULE BY MOUTH EVERY DAY BEFORE BREAKFAST  Dispense: 30 capsule; Refill: 0  4. Insulin resistance Kathryn Munoz will continue to work on weight loss, exercise, and decreasing simple carbohydrates to help decrease the risk of diabetes. Kathryn Munoz agreed to follow-up with [place] as directed to closely monitor her progress. Continue to decrease and carbohydrates and current Rx regimen.  5. At risk for diabetes mellitus Kathryn Munoz was given approximately 15 minutes of diabetes education and counseling today. We discussed intensive lifestyle modifications today with an emphasis on weight loss as well as increasing exercise and decreasing simple carbohydrates in her diet. We also reviewed medication options with an emphasis on risk versus benefit of those discussed.   Repetitive spaced learning was employed today to elicit superior memory formation and behavioral change.  6. Obesity with current BMI 39.8  Kathryn Munoz is currently in the action stage of change. As such, her goal is to continue with weight loss efforts. She has agreed to the Category 3 Plan.   Set 3 month goal and ultimate goal. Track intake with MyFitnessPal Keep snack calories <300/day  We discussed various medication options to help Kathryn Munoz with her weight loss efforts and we both agreed to continue Saxenda 1.2 mg, as prescribed below. - Insulin Pen Needle 32G X 6 MM MISC; Will use with Saxenda daily  Dispense: 100 each; Refill: 0 - Liraglutide -Weight Management (SAXENDA) 18 MG/3ML SOPN; INJECT 1.2 MG UNDER THE SKIN ONCE DAILY   Dispense: 15 mL; Refill: 1  Exercise goals:  As is  Behavioral modification strategies: increasing lean protein intake, decreasing simple carbohydrates, meal planning and cooking strategies, keeping healthy foods in the home, and planning for success.  Kathryn Munoz has agreed to follow-up with our clinic in 6 weeks. She was informed of the importance of frequent follow-up visits to maximize her success with intensive lifestyle modifications for her multiple health conditions.   Objective:   Blood pressure 115/76, pulse 72, temperature 98.3 F (36.8 C), height 5\' 5"  (1.651 m), weight 239 lb (108.4 kg), SpO2 98 %. Body mass index is 39.77 kg/m.  General: Cooperative, alert, well developed, in no acute distress. HEENT: Conjunctivae and lids unremarkable. Cardiovascular: Regular rhythm.  Lungs: Normal work of breathing. Neurologic: No focal deficits.   Lab Results  Component Value Date   CREATININE 0.91 08/05/2020   BUN 8 08/05/2020   NA 142 08/05/2020   K 4.6 08/05/2020   CL 105 08/05/2020   CO2 23 08/05/2020   Lab Results  Component Value Date   ALT 8 08/05/2020   AST 13 08/05/2020   ALKPHOS 60 08/05/2020   BILITOT 0.5 08/05/2020   Lab Results  Component Value Date   HGBA1C 5.3 08/05/2020   HGBA1C 5.0 05/04/2020   HGBA1C 5.2 01/07/2020  HGBA1C 5.2 10/14/2019   HGBA1C 5.5 07/17/2019   Lab Results  Component Value Date   INSULIN 42.7 (H) 08/05/2020   INSULIN 46.7 (H) 05/04/2020   INSULIN 29.0 (H) 01/07/2020   INSULIN 32.5 (H) 10/14/2019   INSULIN 19.6 07/17/2019   Lab Results  Component Value Date   TSH 3.800 05/04/2020   Lab Results  Component Value Date   CHOL 176 05/04/2020   HDL 61 05/04/2020   LDLCALC 93 05/04/2020   TRIG 123 05/04/2020   CHOLHDL 3.7 03/31/2019   Lab Results  Component Value Date   WBC 6.7 05/14/2020   HGB 12.3 05/14/2020   HCT 37.3 05/14/2020   MCV 94.2 05/14/2020   PLT 349 05/14/2020   No results found for: IRON, TIBC,  FERRITIN  Attestation Statements:   Reviewed by clinician on day of visit: allergies, medications, problem list, medical history, surgical history, family history, social history, and previous encounter notes.  Edmund Hilda, CMA, am acting as transcriptionist for [name], NP.  I have reviewed the above documentation for accuracy and completeness, and I agree with the above. -  Cristalle Rohm d. Bentzion Dauria, NP-C

## 2020-09-19 ENCOUNTER — Other Ambulatory Visit: Payer: Self-pay | Admitting: Family Medicine

## 2020-09-26 ENCOUNTER — Other Ambulatory Visit (INDEPENDENT_AMBULATORY_CARE_PROVIDER_SITE_OTHER): Payer: Self-pay | Admitting: Adult Health

## 2020-09-26 DIAGNOSIS — E038 Other specified hypothyroidism: Secondary | ICD-10-CM

## 2020-09-27 NOTE — Telephone Encounter (Signed)
Pt last seen by Katy Danford, FNP.  

## 2020-09-29 ENCOUNTER — Telehealth: Payer: BC Managed Care – PPO | Admitting: Psychiatry

## 2020-09-29 NOTE — Telephone Encounter (Signed)
Mychart message sent.

## 2020-10-04 ENCOUNTER — Other Ambulatory Visit: Payer: Self-pay

## 2020-10-04 ENCOUNTER — Ambulatory Visit (INDEPENDENT_AMBULATORY_CARE_PROVIDER_SITE_OTHER): Payer: BC Managed Care – PPO | Admitting: Licensed Clinical Social Worker

## 2020-10-04 DIAGNOSIS — F3131 Bipolar disorder, current episode depressed, mild: Secondary | ICD-10-CM

## 2020-10-04 NOTE — Progress Notes (Signed)
Virtual Visit via Audio Note  I connected with Kathryn Munoz on 10/04/20 at  3:00 PM EDT by an audio enabled telemedicine application and verified that I am speaking with the correct person using two identifiers.  Location: Patient: home Provider: remote office [place], [place])   I discussed the limitations of evaluation and management by telemedicine and the availability of in person appointments. The patient expressed understanding and agreed to proceed.  I discussed the assessment and treatment plan with the patient. The patient was provided an opportunity to ask questions and all were answered. The patient agreed with the plan and demonstrated an understanding of the instructions.   The patient was advised to call back or seek an in-person evaluation if the symptoms worsen or if the condition fails to improve as anticipated.  I provided 60 minutes of non-face-to-face time during this encounter.   Delle Andrzejewski R Camile Esters, LCSW   THERAPIST PROGRESS NOTE  Session Time: 3-4p  Participation Level: Active  Behavioral Response: NAAlertAnxious  Type of Therapy: Individual Therapy  Treatment Goals addressed: Anxiety and Coping  Interventions: CBT, DBT, and Psychosocial Skills: couples communication skills  Summary: Kathryn Munoz is a 45 y.o. female who presents with fluctuating mood since returning from her trip recently. Patient reports that overall anxiety has increased and she doesn't feel that she's managing things well. Reviewed overall anxiety management and mood regulation skills. Patient reports good quality and quantity of sleep.  Allowed patient safe space to explore and express thoughts and feelings associated with recent trip. Patient reported several situations that triggered anxiety and stress processed through different techniques that patient used in the moment to manage.   Explored patient's current relationship with husband, and ways that they could improve in  overall communication styles and conflict styles. patient also reports anxiety over the recent decision that patients husband made to go back to school to become a Emergency planning/management officer. Helped patient identify feelings associated with this decision, and explored helpful coping mechanisms.   Patient reports that overall she's not feeling well physically--patient feels that she has a lot of stomach distress. Encouraged patient to keep an eye on things and to contact her primary care physician as needed.  Continued recommendations are as follows: self care behaviors, positive social engagements, focusing on overall work/home/life balance, and focusing on positive physical and emotional wellness.    Suicidal/Homicidal: No  Therapist Response: Patient reports that she is able to describe current and past experiences with specific fears, prominent worries, and anxiety symptoms including their impact on functioning and attempts to resolve it. Patient is able to identify and anxiety coping mechanism that has been successful in the past and increase use. Patient can verbalize an understanding of the role that fearful thinking plays in creating fears, excessive worry, and persistent anxiety symptoms. Patient is able to implement appropriate relaxation and diversion activities to decrease level of anxiety. These behaviors are reflective of continuing personal growth and progress. Treatment to continue as indicated.  Plan: Return again in 4 weeks.  Diagnosis: Axis I: Bipolar, Depressed    Axis II: No diagnosis    Ernest Haber [employer] Delong, LCSW 10/04/2020

## 2020-10-13 ENCOUNTER — Ambulatory Visit (INDEPENDENT_AMBULATORY_CARE_PROVIDER_SITE_OTHER): Payer: BC Managed Care – PPO | Admitting: Adult Health

## 2020-10-13 ENCOUNTER — Encounter (INDEPENDENT_AMBULATORY_CARE_PROVIDER_SITE_OTHER): Payer: Self-pay | Admitting: Adult Health

## 2020-10-13 ENCOUNTER — Other Ambulatory Visit: Payer: Self-pay

## 2020-10-13 VITALS — BP 120/81 | HR 78 | Temp 98.5°F | Ht 65.0 in | Wt 244.0 lb

## 2020-10-13 DIAGNOSIS — E038 Other specified hypothyroidism: Secondary | ICD-10-CM | POA: Diagnosis not present

## 2020-10-13 DIAGNOSIS — K5903 Drug induced constipation: Secondary | ICD-10-CM | POA: Diagnosis not present

## 2020-10-13 DIAGNOSIS — E559 Vitamin D deficiency, unspecified: Secondary | ICD-10-CM

## 2020-10-13 DIAGNOSIS — Z9189 Other specified personal risk factors, not elsewhere classified: Secondary | ICD-10-CM | POA: Diagnosis not present

## 2020-10-13 DIAGNOSIS — Z6841 Body Mass Index (BMI) 40.0 and over, adult: Secondary | ICD-10-CM

## 2020-10-13 MED ORDER — LINACLOTIDE 145 MCG PO CAPS: ORAL_CAPSULE | ORAL | 0 refills | Status: DC

## 2020-10-13 MED ORDER — LEVOTHYROXINE SODIUM 75 MCG PO TABS: ORAL_TABLET | ORAL | 0 refills | Status: DC

## 2020-10-13 MED ORDER — SAXENDA 18 MG/3ML ~~LOC~~ SOPN: PEN_INJECTOR | SUBCUTANEOUS | 1 refills | Status: DC

## 2020-10-14 ENCOUNTER — Telehealth (INDEPENDENT_AMBULATORY_CARE_PROVIDER_SITE_OTHER): Payer: BC Managed Care – PPO | Admitting: Psychiatry

## 2020-10-14 ENCOUNTER — Encounter: Payer: Self-pay | Admitting: Psychiatry

## 2020-10-14 DIAGNOSIS — F5105 Insomnia due to other mental disorder: Secondary | ICD-10-CM | POA: Diagnosis not present

## 2020-10-14 DIAGNOSIS — F3181 Bipolar II disorder: Secondary | ICD-10-CM

## 2020-10-14 DIAGNOSIS — F429 Obsessive-compulsive disorder, unspecified: Secondary | ICD-10-CM

## 2020-10-14 MED ORDER — LAMOTRIGINE 150 MG PO TABS: 150.0000 mg | ORAL_TABLET | Freq: Every day | ORAL | 1 refills | Status: DC

## 2020-10-14 MED ORDER — LAMOTRIGINE 25 MG PO TABS: 25.0000 mg | ORAL_TABLET | Freq: Every day | ORAL | 0 refills | Status: DC

## 2020-10-14 NOTE — Progress Notes (Signed)
Virtual Visit via Video Note  I connected with [name] on 10/14/20 at  2:40 PM EDT by a video enabled telemedicine application and verified that I am speaking with the correct person using two identifiers.  Location Provider Location : [facility] Patient Location : Work  Participants: Patient , Provider   I discussed the limitations of evaluation and management by telemedicine and the availability of in person appointments. The patient expressed understanding and agreed to proceed.   I discussed the assessment and treatment plan with the patient. The patient was provided an opportunity to ask questions and all were answered. The patient agreed with the plan and demonstrated an understanding of the instructions.   The patient was advised to call back or seek an in-person evaluation if the symptoms worsen or if the condition fails to improve as anticipated.   [place] MD OP Progress Note  10/14/2020 5:49 PM JALAYNE GANESH  MRN:  [id number]  Chief Complaint:  Chief Complaint   Follow-up; Anxiety    HPI: Kathryn Munoz is a 45 year old African-American female, lives in [place], has a history of bipolar disorder, insomnia, OCD, insulin resistance, hypothyroidism, other specified eating disorder was evaluated by telemedicine today.  Patient today reports she is currently going through depressive symptoms like sadness, low motivation, racing thoughts, low energy.  Patient reports this has been going on since the past few days.  This is getting worse.  She reports sleep is also affected.  She reports she has been taking only the Seroquel since when she takes the trazodone along with the Seroquel she needs more time to stay in bed and she cannot do it on the days that she has to go to work.  However when she does combined together she is able to sleep better.  Patient denies any suicidality, homicidality or perceptual disturbances.  Patient reports work is going well.  Patient is motivated to  stay in therapy.  She reports she does have situational stressors of her husband currently going through a job change.  He is joining the Interior and spatial designer and that does make her anxious.  She reports her current mood symptoms are likely due to this stressor.  Patient agrees to work with her therapist on this.  She is compliant on medications and denies side effects.  Patient denies any other concerns today.  Visit Diagnosis:    ICD-10-CM   1. Bipolar 2 disorder, major depressive episode (HCC)  F31.81 lamoTRIgine (LAMICTAL) 150 MG tablet    lamoTRIgine (LAMICTAL) 25 MG tablet    2. Insomnia due to mental condition  F51.05    mood    3. 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  F42.9       Past Psychiatric History: Reviewed past psychiatric history from progress note on 11/08/2017.  Past trials of Celexa.  Patient with history of suicide attempt when she overdosed on pills was admitted to the hospital.  She reports another episode of overdose at the age of 54 however she slept through it and did not tell anyone.  Past Medical History:  Past Medical History:  Diagnosis Date   Abnormal mammogram    breast biopsy, [place] 2014   ADD (attention deficit disorder)    Anxiety    Bipolar 2 disorder ([place])    Constipation    Depression    Dermatitis    Heavy menstrual bleeding    History of gestational diabetes    History of kidney stones 2014   Hypothyroidism  IFG (impaired fasting glucose)    Insulin resistance    Irregular periods    Joint pain    Multiple food allergies    Dates and nuts   Neuropathy    Obesity    OCD (obsessive compulsive disorder)    Prediabetes    Vitamin D deficiency disease     Past Surgical History:  Procedure Laterality Date   BREAST BIOPSY Left 2014   [place] bx/clip-neg   INTRAUTERINE DEVICE INSERTION  03/11/2012    Family Psychiatric History: Reviewed family psychiatric history from progress note on 11/08/2017.  Family History:   Family History  Problem Relation Age of Onset   Diabetes Mother    Pancreatitis Mother    Hypertension Mother    Alcohol abuse Mother    Schizophrenia Mother    Depression Mother    Heart disease Mother    Stroke Mother    Thyroid disease Mother    Kidney disease Mother    Anxiety disorder Mother    Alcohol abuse Father    Diabetes Sister        pre-diabetic   Diabetes Brother    Drug abuse Brother    ADD / ADHD Son    Eczema Son    Breast cancer Cousin        pat cousin   Anxiety disorder Other    Ovarian cancer Neg Hx     Social History: Reviewed social history from progress note on 11/08/2017. Social History   Socioeconomic History   Marital status: Married    Spouse name: Matsue Strom   Number of children: 2   Years of education: Not on file   Highest education level: Bachelor's degree (e.g., BA, AB, BS)  Occupational History   Occupation: English as a second language teacher  Tobacco Use   Smoking status: Never   Smokeless tobacco: Never  Vaping Use   Vaping Use: Never used  Substance and Sexual Activity   Alcohol use: No   Drug use: No   Sexual activity: Yes    Partners: Male    Birth control/protection: Other-see comments    Comment: nuvaring  Other Topics Concern   Not on file  Social History Narrative   Not on file   Social Determinants of Health   Financial Resource Strain: Not on file  Food Insecurity: Not on file  Transportation Needs: Not on file  Physical Activity: Not on file  Stress: Not on file  Social Connections: Not on file    Allergies:  Allergies  Allergen Reactions   Date Seed Extract  [Zizyphus Jujuba] Anaphylaxis, Cough, Itching and Swelling   Other Swelling    Metabolic Disorder Labs: Lab Results  Component Value Date   HGBA1C 5.3 08/05/2020   MPG 117 03/31/2019   MPG 91 05/09/2018   No results found for: PROLACTIN Lab Results  Component Value Date   CHOL 176 05/04/2020   TRIG 123 05/04/2020   HDL 61 05/04/2020   CHOLHDL 3.7  03/31/2019   VLDL 14 10/16/2016   LDLCALC 93 05/04/2020   LDLCALC 113 (H) 10/14/2019   Lab Results  Component Value Date   TSH 3.800 05/04/2020   TSH 2.210 07/17/2019    Therapeutic Level Labs: No results found for: LITHIUM No results found for: VALPROATE No components found for:  CBMZ  Current Medications: Current Outpatient Medications  Medication Sig Dispense Refill   lamoTRIgine (LAMICTAL) 25 MG tablet Take 1 tablet (25 mg total) by mouth daily. Take along with 150 mg daily 30  tablet 0   citalopram (CELEXA) 40 MG tablet Take 0.5 tablets (20 mg total) by mouth daily. 90 tablet 1   EPINEPHrine 0.3 mg/0.3 mL IJ SOAJ injection INJECT 0.3 MLS INTO THE MUSCLE ONCE FOR 1 DOSE. FOR LIFE-THREATENING ALLERGIC REACTION / ANAPHYLAXIS  1   etonogestrel-ethinyl estradiol (NUVARING) 0.12-0.015 MG/24HR vaginal ring INSERT 1 RING VAGINALLY AS DIRECTED. REMOVE AFTER 3 WEEKS & WAIT 7 DAYS BEFORE INSERTING A NEW RING 3 each 3   FLOWFLEX COVID-19 AG HOME TEST KIT FOLLOW INSTRUCTIONS INCLUDED WITH THE PACKAGE.     Insulin Pen Needle 32G X 6 MM MISC Will use with Saxenda daily 100 each 0   lamoTRIgine (LAMICTAL) 150 MG tablet Take 1 tablet (150 mg total) by mouth daily. Take along with 25 mg daily 90 tablet 1   levothyroxine (SYNTHROID) 75 MCG tablet TAKE 1 TABLET BY MOUTH EVERY DAY BEFORE BREAKFAST 30 tablet 0   linaclotide (LINZESS) 145 MCG CAPS capsule TAKE 1 CAPSULE BY MOUTH EVERY DAY BEFORE BREAKFAST 30 capsule 0   Liraglutide -Weight Management (SAXENDA) 18 MG/3ML SOPN INJECT 1.2 MG UNDER THE SKIN ONCE DAILY 15 mL 1   metFORMIN (GLUCOPHAGE-XR) 500 MG 24 hr tablet TAKE 3 TABLETS (1,500 MG TOTAL) BY MOUTH DAILY. 270 tablet 3   polyethylene glycol (MIRALAX / GLYCOLAX) packet Take 17 g by mouth as needed.      QUEtiapine (SEROQUEL) 25 MG tablet Take 1 tablet (25 mg total) by mouth at bedtime. 90 tablet 0   traZODone (DESYREL) 50 MG tablet TAKE 1 AND 1/2 TO 2 TABLETS (75-100 MG TOTAL) BY MOUTH AT  BEDTIME AS NEEDED FOR SLEEP. FOR SLEEP 180 tablet 1   No current facility-administered medications for this visit.     Musculoskeletal: Strength & Muscle Tone:  UTA Gait & Station:  UTA Patient leans: N/A  Psychiatric Specialty Exam: Review of Systems  Psychiatric/Behavioral:  Positive for dysphoric mood and sleep disturbance. The patient is nervous/anxious.   All other systems reviewed and are negative.  There were no vitals taken for this visit.There is no height or weight on file to calculate BMI.  General Appearance: Casual  Eye Contact:  Fair  Speech:  Clear and Coherent  Volume:  Normal  Mood:  Anxious and Depressed  Affect:  Congruent  Thought Process:  Goal Directed and Descriptions of Associations: Intact  Orientation:  Full (Time, Place, and Person)  Thought Content: Logical   Suicidal Thoughts:  No  Homicidal Thoughts:  No  Memory:  Immediate;   Fair Recent;   Fair Remote;   Fair  Judgement:  Fair  Insight:  Fair  Psychomotor Activity:  Normal  Concentration:  Concentration: Fair and Attention Span: Fair  Recall:  [employer] of Knowledge: Fair  Language: Fair  Akathisia:  No  Handed:  Right  AIMS (if indicated): not done  Assets:  Communication Skills Desire for Improvement Financial Resources/Insurance Housing Intimacy Social Support Talents/Skills Transportation Vocational/Educational  ADL's:  Intact  Cognition: WNL  Sleep:  Poor   Screenings: GAD-7    Flowsheet Row [facility]14/[facility] in [facility]  Total GAD-7 Score 4      PHQ2-9    [place] Video Visit from 10/14/2020 in [facility] Counselor from 10/04/2020 in [place] Visit from 07/27/2020 in [facility] from 07/14/2020 in [place] [place] [address] from 06/10/2020 in [facility]  PHQ-2 Total Score 6  1 0 0 0  PHQ-9 Total Score 18 -- 6 -- --      Flowsheet Row Counselor from 10/04/2020 in [facility] Office Visit from 07/27/2020 in [place] Counselor from 07/14/2020 in [facility] Low Risk Low Risk No Risk        Assessment and Plan: BRENISHA TSUI is a 45 year old African-American female, married, employed, lives in [place], has a history of bipolar disorder, sleep problems, OCD, hypothyroidism was evaluated by telemedicine today.  Patient is currently struggling with depression, most likely due to her situational stressors as noted above.  She will benefit from continued medication management and psychotherapy sessions.  Plan Bipolar disorder depressed-unstable Increase lamotrigine to 175 mg p.o. daily.  Patient could take it in divided dosage. Celexa 20 mg p.o. daily-dose reduced due to sexual dysfunction. Seroquel 25 mg p.o. nightly-reduced dosage  OCD-improving Continue CBT with Ms. Christina Hussami Continue Celexa as prescribed OCD groups as needed  Insomnia-unstable Patient advised to take trazodone 25 to 50 mg at bedtime as needed along with the Seroquel 25 mg. She could also reduce her Seroquel to a lower dosage and increase the dosage of trazodone to a 50 or 100 mg as needed. Patient agrees to try this and will let writer know.  Follow-up in clinic in 2 weeks or sooner if needed.  This note was generated in part or whole with voice recognition software. Voice recognition is usually quite accurate but there are transcription errors that can and very often do occur. I apologize for any typographical errors that were not detected and corrected.     Ursula Alert, MD 10/15/2020, 5:54 PM

## 2020-10-18 NOTE — Progress Notes (Addendum)
Chief Complaint:   OBESITY Kathryn Munoz is here to discuss her progress with her obesity treatment plan along with follow-up of her obesity related diagnoses. Kathryn Munoz is on the Category 3 Plan and states she is following her eating plan approximately 30% of the time. Kathryn Munoz states she is walking for 60-90 minutes 2 times per week.  Today's visit was #: 21 Starting weight: 252 lbs Starting date: 07/17/2019 Today's weight: 244 lbs Today's date: 10/13/2020 Total lbs lost to date: 8 lbs Total lbs lost since last in-office visit: 0  Interim History: Kathryn Munoz says that within the last 6 weeks, she had diarrhea for 3 weeks and she believes it was stress related.  Her husband began the  BELT program for Photographer.  She has been off Saxenda 1.2 mg daily for 2 weeks.  Subjective:   1. Other specified hypothyroidism On 05/04/2020, thyroid panel was stable/at goal.  Lab Results  Component Value Date   TSH 3.800 05/04/2020    2. Drug-induced constipation She is on Saxenda 1.2 mg daily.  She has been off it for 2 week.  Wants to restart.   3. Vitamin D deficiency She is currently taking OTC vitamin D 2,000 IU each day.  She converted to this from ergocalciferol.  She denies nausea, vomiting or muscle weakness.  Vitamin D level on 08/05/2020 was 49.6.  Lab Results  Component Value Date   VD25OH 49.6 08/05/2020   VD25OH 37.7 05/04/2020   VD25OH 47.4 01/07/2020   4. At risk for nausea Kathryn Munoz is at risk for nausea due to restarting GLP-1 therapy for obesity.  Assessment/Plan:   1. Other specified hypothyroidism Patient with long-standing hypothyroidism, on levothyroxine therapy. She appears euthyroid. Orders and follow up as documented in patient record.  Will refill levothyroxine 75 mcg today.  - Refill levothyroxine (SYNTHROID) 75 MCG tablet; TAKE 1 TABLET BY MOUTH EVERY DAY BEFORE BREAKFAST  Dispense: 30 tablet; Refill: 0  2. Drug-induced constipation Kathryn Munoz was  informed that a decrease in bowel movement frequency is normal while losing weight, but stools should not be hard or painful. Orders and follow up as documented in patient record.  Refill Linzess 145 mcg daily.  Counseling Getting to Good Bowel Health: Your goal is to have one soft bowel movement each day. Drink at least 8 glasses of water each day. Eat plenty of fiber (goal is over 25 grams each day). It is best to get most of your fiber from dietary sources which includes leafy green vegetables, fresh fruit, and whole grains. You may need to add fiber with the help of OTC fiber supplements. These include Metamucil, Citrucel, and Flaxseed. If you are still having trouble, try adding Miralax or Magnesium Citrate. If all of these changes do not work, Dietitian.   - Refill linaclotide (LINZESS) 145 MCG CAPS capsule; TAKE 1 CAPSULE BY MOUTH EVERY DAY BEFORE BREAKFAST  Dispense: 30 capsule; Refill: 0  3. Vitamin D deficiency Low Vitamin D level contributes to fatigue and are associated with obesity, breast, and colon cancer. She agrees to continue to take OTC vitamin D 2,000 IU daily and will follow-up for routine testing of Vitamin D, at least 2-3 times per year to avoid over-replacement.   4. At risk for nausea Kathryn Munoz was given approximately 15 minutes of nausea prevention counseling today. Kathryn Munoz is at risk for nausea due to her new or current medication. She was encouraged to titrate her medication slowly, make sure to  stay hydrated, eat smaller portions throughout the day, and avoid high fat meals.    5. Obesity with current BMI 39.8  - Refill Liraglutide -Weight Management (SAXENDA) 18 MG/3ML SOPN; INJECT 1.2 MG UNDER THE SKIN ONCE DAILY  Dispense: 15 mL; Refill: 1  Restart Saxenda 0.6 mg daily for 1 week, then increase to 1.2 mg daily the second week.  Kathryn Munoz is currently in the action stage of change. As such, her goal is to continue with weight loss efforts. She has  agreed to the Category 3 Plan.   Increase compliance of Category 3 to more than 80% of the time.  Exercise goals:  As is.  Behavioral modification strategies: increasing lean protein intake, decreasing simple carbohydrates, increasing water intake, meal planning and cooking strategies, keeping healthy foods in the home, and planning for success.  Kathryn Munoz has agreed to follow-up with our clinic in 2 weeks. She was informed of the importance of frequent follow-up visits to maximize her success with intensive lifestyle modifications for her multiple health conditions.   Objective:   Blood pressure 120/81, pulse 78, temperature 98.5 F (36.9 C), height 5\' 5"  (1.651 m), weight 244 lb (110.7 kg), SpO2 99 %. Body mass index is 40.6 kg/m.  General: Cooperative, alert, well developed, in no acute distress. HEENT: Conjunctivae and lids unremarkable. Cardiovascular: Regular rhythm.  Lungs: Normal work of breathing. Neurologic: No focal deficits.   Lab Results  Component Value Date   CREATININE 0.91 08/05/2020   BUN 8 08/05/2020   NA 142 08/05/2020   K 4.6 08/05/2020   CL 105 08/05/2020   CO2 23 08/05/2020   Lab Results  Component Value Date   ALT 8 08/05/2020   AST 13 08/05/2020   ALKPHOS 60 08/05/2020   BILITOT 0.5 08/05/2020   Lab Results  Component Value Date   HGBA1C 5.3 08/05/2020   HGBA1C 5.0 05/04/2020   HGBA1C 5.2 01/07/2020   HGBA1C 5.2 10/14/2019   HGBA1C 5.5 07/17/2019   Lab Results  Component Value Date   INSULIN 42.7 (H) 08/05/2020   INSULIN 46.7 (H) 05/04/2020   INSULIN 29.0 (H) 01/07/2020   INSULIN 32.5 (H) 10/14/2019   INSULIN 19.6 07/17/2019   Lab Results  Component Value Date   TSH 3.800 05/04/2020   Lab Results  Component Value Date   CHOL 176 05/04/2020   HDL 61 05/04/2020   LDLCALC 93 05/04/2020   TRIG 123 05/04/2020   CHOLHDL 3.7 03/31/2019   Lab Results  Component Value Date   VD25OH 49.6 08/05/2020   VD25OH 37.7 05/04/2020   VD25OH  47.4 01/07/2020   Lab Results  Component Value Date   WBC 6.7 05/14/2020   HGB 12.3 05/14/2020   HCT 37.3 05/14/2020   MCV 94.2 05/14/2020   PLT 349 05/14/2020   Attestation Statements:   Reviewed by clinician on day of visit: allergies, medications, problem list, medical history, surgical history, family history, social history, and previous encounter notes.  I, 05/16/2020, CMA, am acting as Insurance claims handler for Energy manager, NP.  I have reviewed the above documentation for accuracy and completeness, and I agree with the above. -  Jaycob Mcclenton d. Monterrius Cardosa, NP-C

## 2020-10-25 ENCOUNTER — Encounter (INDEPENDENT_AMBULATORY_CARE_PROVIDER_SITE_OTHER): Payer: Self-pay | Admitting: Adult Health

## 2020-10-25 ENCOUNTER — Other Ambulatory Visit: Payer: Self-pay

## 2020-10-25 ENCOUNTER — Ambulatory Visit (INDEPENDENT_AMBULATORY_CARE_PROVIDER_SITE_OTHER): Payer: BC Managed Care – PPO | Admitting: Adult Health

## 2020-10-25 VITALS — BP 130/77 | HR 85 | Temp 99.0°F | Ht 65.0 in | Wt 240.0 lb

## 2020-10-25 DIAGNOSIS — K5903 Drug induced constipation: Secondary | ICD-10-CM | POA: Diagnosis not present

## 2020-10-25 DIAGNOSIS — E038 Other specified hypothyroidism: Secondary | ICD-10-CM | POA: Diagnosis not present

## 2020-10-25 DIAGNOSIS — Z6841 Body Mass Index (BMI) 40.0 and over, adult: Secondary | ICD-10-CM

## 2020-10-25 DIAGNOSIS — Z9189 Other specified personal risk factors, not elsewhere classified: Secondary | ICD-10-CM

## 2020-10-25 MED ORDER — LINACLOTIDE 145 MCG PO CAPS: ORAL_CAPSULE | ORAL | 0 refills | Status: DC

## 2020-10-25 MED ORDER — LEVOTHYROXINE SODIUM 75 MCG PO TABS: ORAL_TABLET | ORAL | 0 refills | Status: DC

## 2020-10-25 MED ORDER — SAXENDA 18 MG/3ML ~~LOC~~ SOPN: PEN_INJECTOR | SUBCUTANEOUS | 1 refills | Status: DC

## 2020-10-26 NOTE — Progress Notes (Signed)
Chief Complaint:   OBESITY Kathryn Munoz is here to discuss her progress with her obesity treatment plan along with follow-up of her obesity related diagnoses. [name] is on the Category 3 Plan and states she is following her eating plan approximately 75% of the time. [name] states she is not exercising regularly.  Today's visit was #: 22 Starting weight: 252 lbs Starting date: 07/17/2019 Today's weight: 240 lbs Today's date: 10/25/2020 Total lbs lost to date: 12 lbs Total lbs lost since last in-office visit: 4 lbs  Interim History:  [name] restarted liraglutide 0.6 mg daily on 10/13/2020.  Held at this dose and only had slight constipation (chronic constipation).  She feels that she is back on track with medication compliance and adhering to the Category 3 meal plan >80% of the time.  Subjective:   1. Drug-induced constipation On GLP-1 for obesity.  Constipation - has been chronic condition for years.  2. Other specified hypothyroidism On 05/04/2020, thyroid panel at goal.  She reports increased energy.  Lab Results  Component Value Date   TSH 3.800 05/04/2020    3. At risk for nausea Kathryn Munoz is at risk for nausea due to increasing GLP for obesity.  Assessment/Plan:   1. Drug-induced constipation Kathryn Munoz was informed that a decrease in bowel movement frequency is normal while losing weight, but stools should not be hard or painful. Orders and follow up as documented in patient record.   Counseling Getting to Good Bowel Health: Your goal is to have one soft bowel movement each day. Drink at least 8 glasses of water each day. Eat plenty of fiber (goal is over 25 grams each day). It is best to get most of your fiber from dietary sources which includes leafy green vegetables, fresh fruit, and whole grains. You may need to add fiber with the help of OTC fiber supplements. These include Metamucil, Citrucel, and Flaxseed. If you are still having trouble, try adding Miralax or Magnesium  Citrate. If all of these changes do not work, Dietitian.  - Refill linaclotide (LINZESS) 145 MCG CAPS capsule; TAKE 1 CAPSULE BY MOUTH EVERY DAY BEFORE BREAKFAST  Dispense: 30 capsule; Refill: 0  2. Other specified hypothyroidism Patient with long-standing hypothyroidism, on levothyroxine therapy. She appears euthyroid. Orders and follow up as documented in patient record.  Counseling Good thyroid control is important for overall health. Supratherapeutic thyroid levels are dangerous and will not improve weight loss results. The correct way to take levothyroxine is fasting, with water, separated by at least 30 minutes from breakfast, and separated by more than 4 hours from calcium, iron, multivitamins, acid reflux medications (PPIs).   - Refill levothyroxine (SYNTHROID) 75 MCG tablet; TAKE 1 TABLET BY MOUTH EVERY DAY BEFORE BREAKFAST  Dispense: 30 tablet; Refill: 0  3. At risk for nausea SHARANDA SHINAULT was given approximately 15 minutes of nausea prevention counseling today. Manahil is at risk for nausea due to her new or current medication. She was encouraged to titrate her medication slowly, make sure to stay hydrated, eat smaller portions throughout the day, and avoid high fat meals.    4. Obesity with current BMI 40.0  - Refill Liraglutide -Weight Management (SAXENDA) 18 MG/3ML SOPN; INJECT 1.2 MG UNDER THE SKIN ONCE DAILY  Dispense: 15 mL; Refill: 1  Kathryn Munoz is currently in the action stage of change. As such, her goal is to continue with weight loss efforts. She has agreed to the Category 3 Plan.   Exercise goals:  Increase daily walking to 5-10 minutes per day.  Behavioral modification strategies: increasing lean protein intake, decreasing simple carbohydrates, meal planning and cooking strategies, keeping healthy foods in the home, better snacking choices, and planning for success.  Refill liraglutide 1.2 mg daily.  Kathryn Munoz has agreed to follow-up with our clinic in 2  weeks. She was informed of the importance of frequent follow-up visits to maximize her success with intensive lifestyle modifications for her multiple health conditions.   Objective:   Blood pressure 130/77, pulse 85, temperature 99 F (37.2 C), height 5\' 5"  (1.651 m), weight 240 lb (108.9 kg), SpO2 95 %. Body mass index is 39.94 kg/m.  General: Cooperative, alert, well developed, in no acute distress. HEENT: Conjunctivae and lids unremarkable. Cardiovascular: Regular rhythm.  Lungs: Normal work of breathing. Neurologic: No focal deficits.   Lab Results  Component Value Date   CREATININE 0.91 08/05/2020   BUN 8 08/05/2020   NA 142 08/05/2020   K 4.6 08/05/2020   CL 105 08/05/2020   CO2 23 08/05/2020   Lab Results  Component Value Date   ALT 8 08/05/2020   AST 13 08/05/2020   ALKPHOS 60 08/05/2020   BILITOT 0.5 08/05/2020   Lab Results  Component Value Date   HGBA1C 5.3 08/05/2020   HGBA1C 5.0 05/04/2020   HGBA1C 5.2 01/07/2020   HGBA1C 5.2 10/14/2019   HGBA1C 5.5 07/17/2019   Lab Results  Component Value Date   INSULIN 42.7 (H) 08/05/2020   INSULIN 46.7 (H) 05/04/2020   INSULIN 29.0 (H) 01/07/2020   INSULIN 32.5 (H) 10/14/2019   INSULIN 19.6 07/17/2019   Lab Results  Component Value Date   TSH 3.800 05/04/2020   Lab Results  Component Value Date   CHOL 176 05/04/2020   HDL 61 05/04/2020   LDLCALC 93 05/04/2020   TRIG 123 05/04/2020   CHOLHDL 3.7 03/31/2019   Lab Results  Component Value Date   VD25OH 49.6 08/05/2020   VD25OH 37.7 05/04/2020   VD25OH 47.4 01/07/2020   Lab Results  Component Value Date   WBC 6.7 05/14/2020   HGB 12.3 05/14/2020   HCT 37.3 05/14/2020   MCV 94.2 05/14/2020   PLT 349 05/14/2020   Attestation Statements:   Reviewed by clinician on day of visit: allergies, medications, problem list, medical history, surgical history, family history, social history, and previous encounter notes.  I, 05/16/2020, CMA, am acting as  Insurance claims handler for Energy manager, NP.  I have reviewed the above documentation for accuracy and completeness, and I agree with the above. -  Herchel Hopkin d. Heath Tesler, NP-C

## 2020-10-28 ENCOUNTER — Other Ambulatory Visit: Payer: Self-pay

## 2020-10-28 ENCOUNTER — Telehealth (INDEPENDENT_AMBULATORY_CARE_PROVIDER_SITE_OTHER): Payer: BC Managed Care – PPO | Admitting: Psychiatry

## 2020-10-28 ENCOUNTER — Encounter: Payer: Self-pay | Admitting: Psychiatry

## 2020-10-28 DIAGNOSIS — F5105 Insomnia due to other mental disorder: Secondary | ICD-10-CM

## 2020-10-28 DIAGNOSIS — F3176 Bipolar disorder, in full remission, most recent episode depressed: Secondary | ICD-10-CM

## 2020-10-28 DIAGNOSIS — F429 Obsessive-compulsive disorder, unspecified: Secondary | ICD-10-CM

## 2020-10-28 MED ORDER — LAMOTRIGINE 25 MG PO TABS: 25.0000 mg | ORAL_TABLET | Freq: Every day | ORAL | 0 refills | Status: DC

## 2020-10-28 NOTE — Progress Notes (Addendum)
Virtual Visit via Video Note  I connected with [name] on 10/28/20 at  8:30 AM EDT by a video enabled telemedicine application and verified that I am speaking with the correct person using two identifiers.  Location Provider Location : [facility] Patient Location : Home  Participants: Patient , Provider   I discussed the limitations of evaluation and management by telemedicine and the availability of in person appointments. The patient expressed understanding and agreed to proceed.   I discussed the assessment and treatment plan with the patient. The patient was provided an opportunity to ask questions and all were answered. The patient agreed with the plan and demonstrated an understanding of the instructions.   The patient was advised to call back or seek an in-person evaluation if the symptoms worsen or if the condition fails to improve as anticipated.    [facility] MD OP Progress Note  10/28/2020 7:14 PM OLUWATAMILORE STARNES  MRN:  [id number]  Chief Complaint:  Chief Complaint   Follow-up; Depression    HPI: Kathryn Munoz is a 45 year old African-American female, lives in [place], has a history of bipolar disorder, insomnia, OCD, insulin resistance, hypothyroidism, other specified eating disorder was evaluated by telemedicine today.  Patient today reports she is currently taking the Lamictal 175 mg daily.  She reports she has noticed a huge improvement in her mood.  She currently feels stable.  She reports she does not have the racing thoughts, sadness or low energy that she had a couple of weeks ago.  She reports sleep is improved.  She stopped the Seroquel and is currently taking trazodone 50 mg that is working for her.  Denies side effects to the trazodone.  She however does report itching of her skin,  her bilateral upper arms as well as her torso.  She does not have any visible rash or redness.  She does not know if the medications are causing it or if it is anything else.  Agrees to  monitor.  Denies suicidality, homicidality or perceptual disturbances.  Reports work is going well.  Denies any other concerns today.  Visit Diagnosis:    ICD-10-CM   1. Bipolar disorder, in full remission, most recent episode depressed (HCC)  F31.76 lamoTRIgine (LAMICTAL) 25 MG tablet   type 2     2. Insomnia due to mental condition  F51.05    mood    3. 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  F42.9       Past Psychiatric History: Reviewed past psychiatric history from progress note on 11/08/2017.  Past trials of Celexa.  Patient with history of suicide attempts when she overdosed on pills, was admitted to the hospital.  She reports another episode of overdose at the age of 37, however she slept through it and did not tell anyone.  Past Medical History:  Past Medical History:  Diagnosis Date   Abnormal mammogram    breast biopsy, [place] 2014   ADD (attention deficit disorder)    Anxiety    Bipolar 2 disorder ([place])    Constipation    Depression    Dermatitis    Heavy menstrual bleeding    History of gestational diabetes    History of kidney stones 2014   Hypothyroidism    IFG (impaired fasting glucose)    Insulin resistance    Irregular periods    Joint pain    Multiple food allergies    Dates and nuts   Neuropathy    Obesity  OCD (obsessive compulsive disorder)    Prediabetes    Vitamin D deficiency disease     Past Surgical History:  Procedure Laterality Date   BREAST BIOPSY Left 2014   [place] bx/clip-neg   INTRAUTERINE DEVICE INSERTION  03/11/2012    Family Psychiatric History: Reviewed family psychiatric history from progress note on 11/08/2017.  Family History:  Family History  Problem Relation Age of Onset   Diabetes Mother    Pancreatitis Mother    Hypertension Mother    Alcohol abuse Mother    Schizophrenia Mother    Depression Mother    Heart disease Mother    Stroke Mother    Thyroid disease Mother    Kidney disease Mother     Anxiety disorder Mother    Alcohol abuse Father    Diabetes Sister        pre-diabetic   Diabetes Brother    Drug abuse Brother    ADD / ADHD Son    Eczema Son    Breast cancer Cousin        pat cousin   Anxiety disorder Other    Ovarian cancer Neg Hx     Social History: Reviewed social history from progress note on 11/08/2017. Social History   Socioeconomic History   Marital status: Married    Spouse name: Zilah Villaflor   Number of children: 2   Years of education: Not on file   Highest education level: Bachelor's degree (e.g., BA, AB, BS)  Occupational History   Occupation: English as a second language teacher  Tobacco Use   Smoking status: Never   Smokeless tobacco: Never  Vaping Use   Vaping Use: Never used  Substance and Sexual Activity   Alcohol use: No   Drug use: No   Sexual activity: Yes    Partners: Male    Birth control/protection: Other-see comments    Comment: nuvaring  Other Topics Concern   Not on file  Social History Narrative   Not on file   Social Determinants of Health   Financial Resource Strain: Not on file  Food Insecurity: Not on file  Transportation Needs: Not on file  Physical Activity: Not on file  Stress: Not on file  Social Connections: Not on file    Allergies:  Allergies  Allergen Reactions   Date Seed Extract  [Zizyphus Jujuba] Anaphylaxis, Cough, Itching and Swelling   Other Swelling    Metabolic Disorder Labs: Lab Results  Component Value Date   HGBA1C 5.3 08/05/2020   MPG 117 03/31/2019   MPG 91 05/09/2018   No results found for: PROLACTIN Lab Results  Component Value Date   CHOL 176 05/04/2020   TRIG 123 05/04/2020   HDL 61 05/04/2020   CHOLHDL 3.7 03/31/2019   VLDL 14 10/16/2016   LDLCALC 93 05/04/2020   LDLCALC 113 (H) 10/14/2019   Lab Results  Component Value Date   TSH 3.800 05/04/2020   TSH 2.210 07/17/2019    Therapeutic Level Labs: No results found for: LITHIUM No results found for: VALPROATE No components found for:   CBMZ  Current Medications: Current Outpatient Medications  Medication Sig Dispense Refill   citalopram (CELEXA) 40 MG tablet Take 0.5 tablets (20 mg total) by mouth daily. 90 tablet 1   EPINEPHrine 0.3 mg/0.3 mL IJ SOAJ injection INJECT 0.3 MLS INTO THE MUSCLE ONCE FOR 1 DOSE. FOR LIFE-THREATENING ALLERGIC REACTION / ANAPHYLAXIS  1   etonogestrel-ethinyl estradiol (NUVARING) 0.12-0.015 MG/24HR vaginal ring INSERT 1 RING VAGINALLY AS DIRECTED. REMOVE AFTER 3  WEEKS & WAIT 7 DAYS BEFORE INSERTING A NEW RING 3 each 3   FLOWFLEX COVID-19 AG HOME TEST KIT FOLLOW INSTRUCTIONS INCLUDED WITH THE PACKAGE.     Insulin Pen Needle 32G X 6 MM MISC Will use with Saxenda daily 100 each 0   lamoTRIgine (LAMICTAL) 150 MG tablet Take 1 tablet (150 mg total) by mouth daily. Take along with 25 mg daily 90 tablet 1   lamoTRIgine (LAMICTAL) 25 MG tablet Take 1 tablet (25 mg total) by mouth daily. Take along with 150 mg daily 90 tablet 0   levothyroxine (SYNTHROID) 75 MCG tablet TAKE 1 TABLET BY MOUTH EVERY DAY BEFORE BREAKFAST 30 tablet 0   linaclotide (LINZESS) 145 MCG CAPS capsule TAKE 1 CAPSULE BY MOUTH EVERY DAY BEFORE BREAKFAST 30 capsule 0   Liraglutide -Weight Management (SAXENDA) 18 MG/3ML SOPN INJECT 1.2 MG UNDER THE SKIN ONCE DAILY 15 mL 1   metFORMIN (GLUCOPHAGE-XR) 500 MG 24 hr tablet TAKE 3 TABLETS (1,500 MG TOTAL) BY MOUTH DAILY. 270 tablet 3   polyethylene glycol (MIRALAX / GLYCOLAX) packet Take 17 g by mouth as needed.      traZODone (DESYREL) 50 MG tablet TAKE 1 AND 1/2 TO 2 TABLETS (75-100 MG TOTAL) BY MOUTH AT BEDTIME AS NEEDED FOR SLEEP. FOR SLEEP 180 tablet 1   No current facility-administered medications for this visit.     Musculoskeletal: Strength & Muscle Tone:  UTA Gait & Station:  Seated Patient leans: N/A  Psychiatric Specialty Exam: Review of Systems  Skin:        Skin itching Upper arm BL , torso - no rash or redness  Psychiatric/Behavioral:  The patient is nervous/anxious.    All other systems reviewed and are negative.  There were no vitals taken for this visit.There is no height or weight on file to calculate BMI.  General Appearance: Casual  Eye Contact:  Good  Speech:  Clear and Coherent  Volume:  Normal  Mood:  Anxious  Affect:  Congruent  Thought Process:  Goal Directed and Descriptions of Associations: Intact  Orientation:  Full (Time, Place, and Person)  Thought Content: Logical   Suicidal Thoughts:  No  Homicidal Thoughts:  No  Memory:  Immediate;   Fair Recent;   Fair Remote;   Fair  Judgement:  Fair  Insight:  Fair  Psychomotor Activity:  Normal  Concentration:  Concentration: Fair and Attention Span: Fair  Recall:  [employer] of Knowledge: Fair  Language: Fair  Akathisia:  No  Handed:  Right  AIMS (if indicated): done  Assets:  Communication Skills Desire for [place] Talents/Skills Transportation Vocational/Educational  ADL's:  Intact  Cognition: WNL  Sleep:   improving   Screenings: AIMS    Flowsheet Row Video Visit from 10/28/2020 in [facility] Total Score 0      GAD-7    Flowsheet Row Video Visit from 10/28/2020 in [facility] Office Visit from 05/14/2020 in [facility]  Total GAD-7 Score 0 4      PHQ2-9    [place] Video Visit from 10/28/2020 in [facility] Video Visit from 10/14/2020 in [facility] Counselor from 10/04/2020 in [facility] from 07/27/2020 in [facility] from 07/14/2020 in [facility]  PHQ-2 Total Score 1 6 1  0 0  PHQ-9 Total Score 3 18 -- 6 --  Flowsheet Row Counselor from 10/04/2020 in [facility] Office Visit from 07/27/2020 in [facility] Counselor from  07/14/2020 in [place] Low Risk Low Risk No Risk        Assessment and Plan: JULANNE SCHLUETER is a 45 year old African-American female, married, employed, lives in [place], has a history of bipolar disorder, sleep problems, OCD, hypothyroidism was evaluated by telemedicine today.  Patient is currently improving.  Patient however likely with adverse side effects to medication, will monitor.  Plan as noted below.  Plan Bipolar disorder depressed in remission Lamictal 175 mg p.o. daily Celexa 20 mg p.o. daily-dose reduced due to sexual dysfunction Discontinue Seroquel for noncompliance  OCD-stable Continue CBT with Ms. Christina Hussami Celexa 20 mg p.o. daily   Insomnia-improving Trazodone 50 mg p.o. nightly Discontinue Seroquel for noncompliance   Discussed with patient to monitor her skin itching, if it does not get better or if it gets worse advised her to reduce the Lamictal back to 150 mg.  She could also use Benadryl as needed for the next couple of days.  Advised patient to Secondary school teacher as needed.  Follow-up in clinic in 1 month or sooner if needed.  This note was generated in part or whole with voice recognition software. Voice recognition is usually quite accurate but there are transcription errors that can and very often do occur. I apologize for any typographical errors that were not detected and corrected.      Ursula Alert, MD 10/28/2020, 7:14 PM

## 2020-11-05 ENCOUNTER — Other Ambulatory Visit: Payer: Self-pay

## 2020-11-05 ENCOUNTER — Ambulatory Visit (INDEPENDENT_AMBULATORY_CARE_PROVIDER_SITE_OTHER): Payer: BC Managed Care – PPO | Admitting: Licensed Clinical Social Worker

## 2020-11-05 DIAGNOSIS — F3176 Bipolar disorder, in full remission, most recent episode depressed: Secondary | ICD-10-CM

## 2020-11-05 NOTE — Progress Notes (Signed)
Virtual Visit via Video Note  I connected with Kathryn Munoz on 11/05/20 at 10:00 AM EDT by a video enabled telemedicine application and verified that I am speaking with the correct person using two identifiers.  Location: Patient: home Provider: remote office [place], [place])   I discussed the limitations of evaluation and management by telemedicine and the availability of in person appointments. The patient expressed understanding and agreed to proceed.  I discussed the assessment and treatment plan with the patient. The patient was provided an opportunity to ask questions and all were answered. The patient agreed with the plan and demonstrated an understanding of the instructions.   The patient was advised to call back or seek an in-person evaluation if the symptoms worsen or if the condition fails to improve as anticipated.  I provided 45 minutes of non-face-to-face time during this encounter.   Cordia Miklos R Herman Mell, LCSW   THERAPIST PROGRESS NOTE  Session Time: 10-10:45a  Participation Level: Active  Behavioral Response: Neat and Well GroomedAlertAnxious  Type of Therapy: Individual Therapy  Treatment Goals addressed: Anxiety and Diagnosis: depression  Interventions: CBT and Solution Focused  Summary: Kathryn Munoz is a 45 y.o. female who presents with improving symptoms related to bipolar disorder diagnosis. Patient reports that overall mood is stable and that she is managing stress and anxiety well. Patient does report that she had an anxious episode yesterday--explored anxiety triggers, and reviewed which coping mechanisms worked best for her to get through the episode yesterday and encouraged patient to continue using these coping skills. Allow patient safe space to explore and express thoughts and feelings associated with recent external stressors in life events. Patient reports that she is currently worried about her brother, who recently won $50,000 in lottery  winnings. Patient is concerned because her brother is also a substance user, and she is fearful that he will overdose on substances. Patient reports that her brother asked her to be payee should something ever happen to him--patient feels comfortable enough to have a conversation with her brother about future financial planning. Patient reports that she had an appointment with her psychiatrist last week, and had some medication changes. Discussed importance of medication compliance and physical activity in the coming months.Continued recommendations are as follows: self care behaviors, positive social engagements, focusing on overall work/home/life balance, and focusing on positive physical and emotional wellness.  .   Suicidal/Homicidal: No  Therapist Response: Patient reports that she is able to describe current and past experiences with specific fears, prominent worries, and anxiety symptoms including their impact on functioning and attempts to resolve it. Patient is able to identify and anxiety coping mechanism that has been successful in the past and increase use. Patient can verbalize an understanding of the role that fearful thinking plays in creating fears, excessive worry, and persistent anxiety symptoms. Patient is able to implement appropriate relaxation and diversion activities to decrease level of anxiety. These behaviors are reflective of continuing personal growth and progress. Treatment to continue as indicated.  Plan: Return again in 4 weeks.  Diagnosis: Axis I: Bipolar, Depressed    Axis II: No diagnosis    Ernest Haber Nellie Chevalier, LCSW 11/05/2020

## 2020-11-07 ENCOUNTER — Other Ambulatory Visit: Payer: Self-pay | Admitting: Obstetrics and Gynecology

## 2020-11-07 DIAGNOSIS — Z3009 Encounter for other general counseling and advice on contraception: Secondary | ICD-10-CM

## 2020-11-09 ENCOUNTER — Ambulatory Visit (INDEPENDENT_AMBULATORY_CARE_PROVIDER_SITE_OTHER): Payer: BC Managed Care – PPO | Admitting: Bariatrics

## 2020-11-10 ENCOUNTER — Other Ambulatory Visit (INDEPENDENT_AMBULATORY_CARE_PROVIDER_SITE_OTHER): Payer: Self-pay | Admitting: Adult Health

## 2020-11-10 DIAGNOSIS — E038 Other specified hypothyroidism: Secondary | ICD-10-CM

## 2020-11-10 NOTE — Telephone Encounter (Signed)
Last seen by Katy 

## 2020-11-16 ENCOUNTER — Encounter: Payer: BC Managed Care – PPO | Admitting: Family Medicine

## 2020-11-16 NOTE — Progress Notes (Signed)
Name: Kathryn Munoz   MRN: [id number]    DOB: 22-Dec-1975   Date:11/17/2020       Progress Note  Subjective  Chief Complaint  Physical   HPI  Patient presents for annual CPE.  Diet: Following meal plan from weight loss center approximately 50% of the time.   Exercise: walking 10 min a day, 5 days a week.  Encouraged to get 150 min a week of exercise.  [place] [facility] Visit from 05/14/2020 in [facility]  AUDIT-C Score 0      Depression: Phq 9 is  positive Depression screen [facility] 2/9 11/17/2020 05/14/2020 11/11/2019 07/17/2019 03/24/2019  Decreased Interest 0 0 0 3 -  Down, Depressed, Hopeless 0 2 0 3 1  PHQ - 2 Score 0 2 0 6 1  Altered sleeping 0 3 0 3 1  Tired, decreased energy 0 1 0 3 1  Change in appetite 0 1 0 2 1  Feeling bad or failure about yourself  0 0 0 1 0  Trouble concentrating 0 0 1 1 0  Moving slowly or fidgety/restless 0 0 0 2 1  Suicidal thoughts 0 0 0 1 0  PHQ-9 Score 0 _0 Difficult doing work/chores Not difficult at all Somewhat difficult Not difficult at all Very difficult Somewhat difficult  Some encounter information is confidential and restricted. Go to Review Flowsheets activity to see all data.  Some recent data might be hidden   Hypertension: BP Readings from Last 3 Encounters:  11/17/20 128/78  10/25/20 130/77  10/13/20 120/81   Obesity: Wt Readings from Last 3 Encounters:  11/17/20 247 lb 6.4 oz (112.2 kg)  10/25/20 240 lb (108.9 kg)  10/13/20 244 lb (110.7 kg)   BMI Readings from Last 3 Encounters:  11/17/20 41.17 kg/m  10/25/20 39.94 kg/m  10/13/20 40.60 kg/m     Vaccines:  Pneumonia: educated and discussed with patient. Flu: educated and discussed with patient, received today  Hep C Screening: 10/2018 STD testing and prevention (HIV/chl/gon/syphilis): 10/2018 Intimate partner violence: safe, no violence Sexual History : one partner, husband Menstrual History/LMP/Abnormal Bleeding: Light periods  monthly LMP: 11/17/2020 Incontinence Symptoms: no symptoms  Breast cancer:  - Last Mammogram: 01/2020, order placed - BRCA gene screening: no breast cancer history in her family  Osteoporosis: Discussed high calcium and vitamin D supplementation, weight bearing exercises  Cervical cancer screening: 10/2017, pap done today  Skin cancer: Discussed monitoring for atypical lesions  Colorectal cancer: does not qualify Lung cancer:  Low Dose CT Chest recommended if Age 62-80 years, 20 pack-year currently smoking OR have quit w/in 15years. Patient does not qualify.   ECG: 06/2019  Advanced Care Planning: A voluntary discussion about advance care planning including the explanation and discussion of advance directives.  Discussed health care proxy and Living will, and the patient was able to identify a health care proxy as husband.    Lipids: Lab Results  Component Value Date   CHOL 176 05/04/2020   CHOL 186 10/14/2019   CHOL 193 07/17/2019   Lab Results  Component Value Date   HDL 61 05/04/2020   HDL 55 10/14/2019   HDL 39 (L) 07/17/2019   Lab Results  Component Value Date   LDLCALC 93 05/04/2020   LDLCALC 113 (H) 10/14/2019   LDLCALC 129 (H) 07/17/2019   Lab Results  Component Value Date   TRIG 123 05/04/2020   TRIG 102 10/14/2019   TRIG 136 07/17/2019  Lab Results  Component Value Date   CHOLHDL 3.7 03/31/2019   CHOLHDL 3.1 11/08/2018   CHOLHDL 2.3 10/22/2017   No results found for: LDLDIRECT  Glucose: Glucose  Date Value Ref Range Status  08/05/2020 99 65 - 99 mg/dL Final  05/04/2020 130 (H) 65 - 99 mg/dL Final  10/14/2019 94 65 - 99 mg/dL Final   Glucose, Bld  Date Value Ref Range Status  03/31/2019 159 (H) 65 - 139 mg/dL Final    Comment:    .        Non-fasting reference interval .   11/08/2018 85 65 - 99 mg/dL Final    Comment:    .            Fasting reference interval .   05/09/2018 111 (H) 65 - 99 mg/dL Final    Comment:    .             Fasting reference interval . For someone without known diabetes, a glucose value between 100 and 125 mg/dL is consistent with prediabetes and should be confirmed with a follow-up test. .     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 01/01/2019   Bipolar 2 disorder, major depressive episode ([place]) 09/05/2018   Insomnia due to mental condition 09/05/2018   Class 3 severe obesity with serious comorbidity and body mass index (BMI) of 40.0 to 44.9 in adult ([place]) 93/79/0240   Metabolic syndrome 97/35/3299   Acanthosis nigricans 10/22/2017   Moderate episode of recurrent major depressive disorder ([place]) 10/22/2017   Acid reflux 04/01/2017   Anxiety 06/01/2016   Morbid obesity with BMI of 40.0-44.9, adult ([place]) 03/12/2015   Peripheral neuropathy 10/09/2014   Hypothyroidism    IFG (impaired fasting glucose)    Vitamin D deficiency    Neuropathy     Past Surgical History:  Procedure Laterality Date   BREAST BIOPSY Left 2014   [place] bx/clip-neg   INTRAUTERINE DEVICE INSERTION  03/11/2012    Family History  Problem Relation Age of Onset   Diabetes Mother    Pancreatitis Mother    Hypertension Mother    Alcohol abuse Mother    Schizophrenia Mother    Depression Mother    Heart disease Mother    Stroke Mother    Thyroid disease Mother    Kidney disease Mother    Anxiety disorder Mother    Alcohol abuse Father    Diabetes Sister        pre-diabetic   Diabetes Brother    Drug abuse Brother    ADD / ADHD Son    Eczema Son    Breast cancer Cousin        pat cousin   Anxiety disorder Other    Ovarian cancer Neg Hx     Social History   Socioeconomic History   Marital  status: Married    Spouse name: Zaleah Ternes   Number of children: 2   Years of education: Not on file   Highest education level: Bachelor's degree (e.g., BA, AB, BS)  Occupational History   Occupation: Chemist  Tobacco Use   Smoking status: Never   Smokeless tobacco: Never  Vaping Use   Vaping Use:  Never used  Substance and Sexual Activity   Alcohol use: No   Drug use: No   Sexual activity: Yes    Partners: Male    Birth control/protection: Other-see comments    Comment: nuvaring  Other Topics Concern   Not on file  Social History Narrative   Not on file   Social Determinants of Health   Financial Resource Strain: Low Risk    Difficulty of Paying Living Expenses: Not hard at all  Food Insecurity: No Food Insecurity   Worried About Charity fundraiser in the Last Year: Never true   [place] in the Last Year: Never true  Transportation Needs: No Transportation Needs   Lack of Transportation (Medical): No   Lack of Transportation (Non-Medical): No  Physical Activity: Insufficiently Active   Days of Exercise per Week: 7 days   Minutes of Exercise per Session: 10 min  Stress: No Stress Concern Present   Feeling of Stress : Not at all  Social Connections: Moderately Integrated   Frequency of Communication with Friends and Family: Three times a week   Frequency of Social Gatherings with Friends and Family: Three times a week   Attends Religious Services: 1 to 4 times per year   Active Member of Clubs or Organizations: No   Attends Archivist Meetings: Never   Marital Status: Married  Human resources officer Violence: Not At Risk   Fear of Current or Ex-Partner: No   Emotionally Abused: No   Physically Abused: No   Sexually Abused: No     Current Outpatient Medications:    Cholecalciferol (VITAMIN D) 50 MCG (2000 UT) tablet, Take 2,000 Units by mouth daily., Disp: , Rfl:    citalopram (CELEXA) 40 MG tablet, Take 0.5 tablets (20 mg total) by mouth daily.,  Disp: 90 tablet, Rfl: 1   EPINEPHrine 0.3 mg/0.3 mL IJ SOAJ injection, INJECT 0.3 MLS INTO THE MUSCLE ONCE FOR 1 DOSE. FOR LIFE-THREATENING ALLERGIC REACTION / ANAPHYLAXIS, Disp: , Rfl: 1   etonogestrel-ethinyl estradiol (NUVARING) 0.12-0.015 MG/24HR vaginal ring, INSERT 1 RING VAGINALLY AS DIRECTED. REMOVE AFTER 3 WEEKS & WAIT 7 DAYS BEFORE INSERTING A NEW RING, Disp: 3 each, Rfl: 3   FLOWFLEX COVID-19 AG HOME TEST KIT, FOLLOW INSTRUCTIONS INCLUDED WITH THE PACKAGE., Disp: , Rfl:    Insulin Pen Needle 32G X 6 MM MISC, Will use with Saxenda daily, Disp: 100 each, Rfl: 0   lamoTRIgine (LAMICTAL) 150 MG tablet, Take 1 tablet (150 mg total) by mouth daily. Take along with 25 mg daily, Disp: 90 tablet, Rfl: 1   lamoTRIgine (LAMICTAL) 25 MG tablet, Take 1 tablet (25 mg total) by mouth daily. Take along with 150 mg daily, Disp: 90 tablet, Rfl: 0   levothyroxine (SYNTHROID) 75 MCG tablet, TAKE 1 TABLET BY MOUTH EVERY DAY BEFORE BREAKFAST, Disp: 30 tablet, Rfl: 0   linaclotide (LINZESS) 145 MCG CAPS capsule, TAKE 1 CAPSULE BY MOUTH EVERY DAY BEFORE BREAKFAST, Disp: 30 capsule, Rfl: 0   Liraglutide -Weight Management (SAXENDA) 18 MG/3ML SOPN, INJECT 1.2 MG UNDER THE SKIN ONCE DAILY, Disp: 15 mL, Rfl: 1   metFORMIN (GLUCOPHAGE-XR) 500 MG 24 hr tablet, TAKE 3 TABLETS (1,500 MG TOTAL) BY MOUTH DAILY., Disp: 270 tablet, Rfl: 3   polyethylene glycol (MIRALAX / GLYCOLAX) packet, Take 17 g by mouth as needed. , Disp: , Rfl:   Allergies  Allergen Reactions   Date Seed Extract  [Zizyphus Jujuba] Anaphylaxis, Cough, Itching and Swelling   Other  Swelling     ROS  Constitutional: Negative for fever or weight change.  Respiratory: Negative for cough and shortness of breath.   Cardiovascular: Negative for chest pain or palpitations.  Gastrointestinal: Negative for abdominal pain, no bowel changes.  Musculoskeletal: Negative for gait problem or joint swelling.  Skin: Negative for rash.  Neurological: Negative  for dizziness or headache.  No other specific complaints in a complete review of systems (except as listed in HPI above).   Objective  Vitals:   11/17/20 1443  BP: 128/78  Resp: 16  Temp: 99.1 F (37.3 C)  SpO2: 98%  Weight: 247 lb 6.4 oz (112.2 kg)  Height: 5' 5" (1.651 m)    Body mass index is 41.17 kg/m.  Physical Exam  Constitutional: Patient appears well-developed and well-nourished. No distress.  HENT: Head: Normocephalic and atraumatic. Ears: B TMs ok, no erythema or effusion; Nose: Nose normal. Mouth/Throat: Oropharynx is clear and moist. No oropharyngeal exudate.  Eyes: Conjunctivae and EOM are normal. Pupils are equal, round, and reactive to light. No scleral icterus.  Neck: Normal range of motion. Neck supple. No JVD present. No thyromegaly present.  Cardiovascular: Normal rate, regular rhythm and normal heart sounds.  No murmur heard. No BLE edema. Pulmonary/Chest: Effort normal and breath sounds normal. No respiratory distress. Abdominal: Soft. Bowel sounds are normal, no distension. There is no tenderness. no masses Breast: no lumps or masses, no nipple discharge or rashes FEMALE GENITALIA:  External genitalia normal External urethra normal Vaginal vault normal without discharge or lesions Cervix normal without discharge or lesions Bimanual exam normal without masses RECTAL: not done Musculoskeletal: Normal range of motion, no joint effusions. No gross deformities Neurological: he is alert and oriented to person, place, and time. No cranial nerve deficit. Coordination, balance, strength, speech and gait are normal.  Skin: Skin is warm and dry. No rash noted. No erythema.  Psychiatric: Patient has a normal mood and affect. behavior is normal. Judgment and thought content normal.   No results found for this or any previous visit (from the past 2160 hour(s)).   Fall Risk: Fall Risk  11/17/2020 05/14/2020 11/11/2019 03/24/2019 11/08/2018  Falls in the past year? 0 0  0 0 0  Number falls in past yr: 0 0 0 0 0  Injury with Fall? 0 0 0 0 0  Follow up - - Falls evaluation completed - -     Functional Status Survey: Is the patient deaf or have difficulty hearing?: No Does the patient have difficulty seeing, even when wearing glasses/contacts?: No Does the patient have difficulty concentrating, remembering, or making decisions?: No Does the patient have difficulty walking or climbing stairs?: No Does the patient have difficulty dressing or bathing?: No Does the patient have difficulty doing errands alone such as visiting a doctor's office or shopping?: No   Assessment & Plan  1. Encounter for annual physical exam   2. Screening for cervical cancer  - Cytology - PAP  3. Encounter for screening mammogram for malignant neoplasm of breast  - MM 3D SCREEN BREAST BILATERAL; Future  4. Need for influenza vaccination  - Flu Vaccine QUAD 6+ mos PF IM (Fluarix Quad PF)   -USPSTF grade A and B recommendations reviewed with patient; age-appropriate recommendations, preventive care, screening tests, etc discussed and encouraged; healthy living encouraged; see AVS for patient education given to patient -Discussed importance of 150 minutes of physical activity weekly, eat two servings of fish weekly, eat one serving of tree nuts (  cashews, pistachios, pecans, almonds.Marland Kitchen) every other day, eat 6 servings of fruit/vegetables daily and drink plenty of water and avoid sweet beverages.

## 2020-11-17 ENCOUNTER — Other Ambulatory Visit (HOSPITAL_COMMUNITY)
Admission: RE | Admit: 2020-11-17 | Discharge: 2020-11-17 | Disposition: A | Discharge status: Home / Self Care | Payer: BC Managed Care – PPO | Source: Ambulatory Visit | Attending: Family Medicine | Admitting: Family Medicine

## 2020-11-17 ENCOUNTER — Other Ambulatory Visit: Payer: Self-pay

## 2020-11-17 ENCOUNTER — Ambulatory Visit (INDEPENDENT_AMBULATORY_CARE_PROVIDER_SITE_OTHER): Payer: BC Managed Care – PPO | Admitting: Nurse Practitioner

## 2020-11-17 ENCOUNTER — Encounter: Payer: Self-pay | Admitting: Nurse Practitioner

## 2020-11-17 VITALS — BP 128/78 | Temp 99.1°F | Resp 16 | Ht 65.0 in | Wt 247.4 lb

## 2020-11-17 DIAGNOSIS — Z1231 Encounter for screening mammogram for malignant neoplasm of breast: Secondary | ICD-10-CM | POA: Diagnosis not present

## 2020-11-17 DIAGNOSIS — Z0000 Encounter for general adult medical examination without abnormal findings: Secondary | ICD-10-CM | POA: Diagnosis not present

## 2020-11-17 DIAGNOSIS — Z23 Encounter for immunization: Secondary | ICD-10-CM | POA: Diagnosis not present

## 2020-11-17 DIAGNOSIS — Z124 Encounter for screening for malignant neoplasm of cervix: Secondary | ICD-10-CM | POA: Diagnosis not present

## 2020-11-17 DIAGNOSIS — R42 Dizziness and giddiness: Secondary | ICD-10-CM | POA: Insufficient documentation

## 2020-11-19 LAB — CYTOLOGY - PAP
Comment: NEGATIVE
Diagnosis: NEGATIVE
High risk HPV: NEGATIVE

## 2020-11-23 ENCOUNTER — Other Ambulatory Visit (INDEPENDENT_AMBULATORY_CARE_PROVIDER_SITE_OTHER): Payer: Self-pay | Admitting: Adult Health

## 2020-11-23 DIAGNOSIS — E038 Other specified hypothyroidism: Secondary | ICD-10-CM

## 2020-11-23 MED ORDER — LEVOTHYROXINE SODIUM 75 MCG PO TABS: ORAL_TABLET | ORAL | 0 refills | Status: DC

## 2020-11-23 NOTE — Telephone Encounter (Signed)
LAST APPOINTMENT DATE: 10/25/20 NEXT APPOINTMENT DATE: 11/30/20   [id number] [place], [place] - [address] [place] [address] Phone: [phone] Fax: [phone]  [facility] [address] [place] - [address] [place] [address] Phone: [phone] Fax: [phone]  Patient is requesting a refill of the following medications: Pending Prescriptions:                       Disp   Refills   levothyroxine (SYNTHROID) 75 MCG tablet [P*90 tab*1       Sig: TAKE 1 TABLET BY MOUTH EVERY DAY BEFORE BREAKFAST   Date last filled: 10/25/20 Previously prescribed by [facility]  Lab Results      Component                Value               Date                      HGBA1C                   5.3                 08/05/2020                HGBA1C                   5.0                 05/04/2020                HGBA1C                   5.2                 01/07/2020           Lab Results      Component                Value               Date                      LDLCALC                  93                  05/04/2020                CREATININE               0.91                08/05/2020           Lab Results      Component                Value               Date                      VD25OH                   49.6                08/05/2020  VD25OH                   37.7                05/04/2020                VD25OH                   47.4                01/07/2020            BP Readings from Last 3 Encounters: 11/17/20 : 128/78 10/25/20 : 130/77 10/13/20 : 120/81

## 2020-11-25 ENCOUNTER — Telehealth: Payer: Self-pay | Admitting: Obstetrics and Gynecology

## 2020-11-25 ENCOUNTER — Other Ambulatory Visit: Payer: Self-pay | Admitting: Obstetrics and Gynecology

## 2020-11-25 DIAGNOSIS — Z3009 Encounter for other general counseling and advice on contraception: Secondary | ICD-10-CM

## 2020-11-25 NOTE — Telephone Encounter (Signed)
Kathryn Munoz called in and states she would like a refill of her Nuvaring but the pharmacy told her she needed to call and make an appointment.  I explained to Kathryn Munoz that she hasn't been seen since 06/2019 and would need to make an appointment to see Dr. Logan Bores for the refill.  Kathryn Munoz states that she had a pap done with her PCP last week and did not schedule with Dr. Logan Bores.  Patient would still like [place] to [facility] [address]  Please advise.

## 2020-11-25 NOTE — Telephone Encounter (Signed)
Called patient. PCP would not refill her Nuvaring. Had pap done at PCP. She would like to know if she can do televisit with you. I told her only concerns would be HCG. Her LMP was on 08/29. Please advise.

## 2020-11-30 ENCOUNTER — Other Ambulatory Visit: Payer: Self-pay

## 2020-11-30 ENCOUNTER — Ambulatory Visit (INDEPENDENT_AMBULATORY_CARE_PROVIDER_SITE_OTHER): Payer: BC Managed Care – PPO | Admitting: Bariatrics

## 2020-11-30 ENCOUNTER — Encounter (INDEPENDENT_AMBULATORY_CARE_PROVIDER_SITE_OTHER): Payer: Self-pay | Admitting: Bariatrics

## 2020-11-30 VITALS — BP 129/81 | HR 88 | Temp 98.1°F | Ht 65.0 in | Wt 244.0 lb

## 2020-11-30 DIAGNOSIS — E038 Other specified hypothyroidism: Secondary | ICD-10-CM

## 2020-11-30 DIAGNOSIS — F5089 Other specified eating disorder: Secondary | ICD-10-CM | POA: Diagnosis not present

## 2020-11-30 DIAGNOSIS — K5903 Drug induced constipation: Secondary | ICD-10-CM | POA: Diagnosis not present

## 2020-11-30 DIAGNOSIS — Z9189 Other specified personal risk factors, not elsewhere classified: Secondary | ICD-10-CM | POA: Diagnosis not present

## 2020-11-30 DIAGNOSIS — Z6841 Body Mass Index (BMI) 40.0 and over, adult: Secondary | ICD-10-CM

## 2020-11-30 MED ORDER — LEVOTHYROXINE SODIUM 75 MCG PO TABS: ORAL_TABLET | ORAL | 0 refills | Status: DC

## 2020-11-30 MED ORDER — LINACLOTIDE 145 MCG PO CAPS: ORAL_CAPSULE | ORAL | 0 refills | Status: DC

## 2020-12-01 NOTE — Progress Notes (Signed)
Chief Complaint:   OBESITY Kathryn Munoz is here to discuss her progress with her obesity treatment plan along with follow-up of her obesity related diagnoses. Kathryn Munoz is on the Category 3 Plan and states she is following her eating plan approximately 0% of the time. Kathryn Munoz states she is walking for 15 minutes 3-4 times per week.  Today's visit was #: 23 Starting weight: 252 lbs Starting date: 07/17/2019 Today's weight: 244 lbs Today's date: 11/30/2020 Total lbs lost to date: 8 lbs Total lbs lost since last in-office visit: 0  Interim History: Kathryn Munoz is up 4 lbs since her last visit. She struggles with protein and H2O. She is eating out more.  Subjective:   1. Other specified hypothyroidism Kathryn Munoz is currently taking her medication as directed.  2. Drug-induced constipation Kathryn Munoz is currently taking her medication as directed.  3. Other disorder of eating Kathryn Munoz is currently taking Celexa, Lamictal and Trazodone.   4. At risk for activity intolerance Kathryn Munoz is at risk for activity intolerance due to obesity and hypothyroidism.   Assessment/Plan:   1. Other specified hypothyroidism We will refill Linzess 145 mcg for 1 month with no refills. Orders and follow up as documented in patient record.  Counseling Good thyroid control is important for overall health. Supratherapeutic thyroid levels are dangerous and will not improve weight loss results. Counseling: The correct way to take levothyroxine is fasting, with water, separated by at least 30 minutes from breakfast, and separated by more than 4 hours from calcium, iron, multivitamins, acid reflux medications (PPIs).    - linaclotide (LINZESS) 145 MCG CAPS capsule; TAKE 1 CAPSULE BY MOUTH EVERY DAY BEFORE BREAKFAST  Dispense: 30 capsule; Refill: 0  - levothyroxine (SYNTHROID) 75 MCG tablet; TAKE 1 TABLET BY MOUTH EVERY DAY BEFORE BREAKFAST  Dispense: 90 tablet; Refill: 0  2. Drug-induced constipation We will refill  Linzess 145 mcg for 1 month with no refills.  - linaclotide (LINZESS) 145 MCG CAPS capsule; TAKE 1 CAPSULE BY MOUTH EVERY DAY BEFORE BREAKFAST  Dispense: 30 capsule; Refill: 0  3. Other disorder of eating Kathryn Munoz will follow up with her counselor. Handout on emotional hunger vs true hunger was given today. Behavior modification techniques were discussed today to help Kathryn Munoz deal with her emotional/non-hunger eating behaviors.  Orders and follow up as documented in patient record.    4. At risk for activity intolerance Kathryn Munoz was given approximately 15 minutes of exercise intolerance counseling today. She is 45 y.o. female and has risk factors exercise intolerance including obesity. We discussed intensive lifestyle modifications today with an emphasis on specific weight loss instructions and strategies. Kathryn Munoz will slowly increase activity as tolerated.  Repetitive spaced learning was employed today to elicit superior memory formation and behavioral change.   5. Obesity with current BMI 40.7 Kathryn Munoz is currently in the action stage of change. As such, her goal is to continue with weight loss efforts. She has agreed to the Category 3 Plan.   Kathryn Munoz will continue meal planning. She will continue intentional eating. She will increase H2O and protein. Handout for High Protein Food Count was given today.  Exercise goals:  As is.  Behavioral modification strategies: increasing lean protein intake, decreasing simple carbohydrates, increasing vegetables, increasing water intake, decreasing eating out, no skipping meals, meal planning and cooking strategies, keeping healthy foods in the home, and planning for success.  Kathryn Munoz has agreed to follow-up with our clinic in 3-4 weeks with myself, [name], NP or Adah Salvage, FNP. She  was informed of the importance of frequent follow-up visits to maximize her success with intensive lifestyle modifications for her multiple health conditions.    Objective:   Blood pressure 129/81, pulse 88, temperature 98.1 F (36.7 C), height 5\' 5"  (1.651 m), weight 244 lb (110.7 kg), SpO2 99 %. Body mass index is 40.6 kg/m.  General: Cooperative, alert, well developed, in no acute distress. HEENT: Conjunctivae and lids unremarkable. Cardiovascular: Regular rhythm.  Lungs: Normal work of breathing. Neurologic: No focal deficits.   Lab Results  Component Value Date   CREATININE 0.91 08/05/2020   BUN 8 08/05/2020   NA 142 08/05/2020   K 4.6 08/05/2020   CL 105 08/05/2020   CO2 23 08/05/2020   Lab Results  Component Value Date   ALT 8 08/05/2020   AST 13 08/05/2020   ALKPHOS 60 08/05/2020   BILITOT 0.5 08/05/2020   Lab Results  Component Value Date   HGBA1C 5.3 08/05/2020   HGBA1C 5.0 05/04/2020   HGBA1C 5.2 01/07/2020   HGBA1C 5.2 10/14/2019   HGBA1C 5.5 07/17/2019   Lab Results  Component Value Date   INSULIN 42.7 (H) 08/05/2020   INSULIN 46.7 (H) 05/04/2020   INSULIN 29.0 (H) 01/07/2020   INSULIN 32.5 (H) 10/14/2019   INSULIN 19.6 07/17/2019   Lab Results  Component Value Date   TSH 3.800 05/04/2020   Lab Results  Component Value Date   CHOL 176 05/04/2020   HDL 61 05/04/2020   LDLCALC 93 05/04/2020   TRIG 123 05/04/2020   CHOLHDL 3.7 03/31/2019   Lab Results  Component Value Date   VD25OH 49.6 08/05/2020   VD25OH 37.7 05/04/2020   VD25OH 47.4 01/07/2020   Lab Results  Component Value Date   WBC 6.7 05/14/2020   HGB 12.3 05/14/2020   HCT 37.3 05/14/2020   MCV 94.2 05/14/2020   PLT 349 05/14/2020   No results found for: IRON, TIBC, FERRITIN  Attestation Statements:   Reviewed by clinician on day of visit: allergies, medications, problem list, medical history, surgical history, family history, social history, and previous encounter notes.   I, 05/16/2020, RMA, am acting as [employer] for [employer] manager, DO.   I have reviewed the above documentation for accuracy and completeness, and I  agree with the above. [employer], DO

## 2020-12-02 ENCOUNTER — Telehealth (INDEPENDENT_AMBULATORY_CARE_PROVIDER_SITE_OTHER): Payer: BC Managed Care – PPO | Admitting: Psychiatry

## 2020-12-02 ENCOUNTER — Other Ambulatory Visit: Payer: Self-pay

## 2020-12-02 ENCOUNTER — Encounter (INDEPENDENT_AMBULATORY_CARE_PROVIDER_SITE_OTHER): Payer: Self-pay | Admitting: Bariatrics

## 2020-12-02 ENCOUNTER — Encounter: Payer: Self-pay | Admitting: Psychiatry

## 2020-12-02 DIAGNOSIS — F3176 Bipolar disorder, in full remission, most recent episode depressed: Secondary | ICD-10-CM | POA: Diagnosis not present

## 2020-12-02 DIAGNOSIS — F429 Obsessive-compulsive disorder, unspecified: Secondary | ICD-10-CM | POA: Diagnosis not present

## 2020-12-02 DIAGNOSIS — F5105 Insomnia due to other mental disorder: Secondary | ICD-10-CM

## 2020-12-02 MED ORDER — TRAZODONE HCL 50 MG PO TABS: 50.0000 mg | ORAL_TABLET | Freq: Every evening | ORAL | 0 refills | Status: DC | PRN

## 2020-12-02 MED ORDER — HYDROXYZINE PAMOATE 25 MG PO CAPS: 25.0000 mg | ORAL_CAPSULE | Freq: Every evening | ORAL | 1 refills | Status: DC | PRN

## 2020-12-02 NOTE — Patient Instructions (Signed)
Hydroxyzine Capsules or Tablets What is this medication? HYDROXYZINE (hye DROX i zeen) treats the symptoms of allergies and allergic reactions. It may also be used to treat anxiety or cause drowsiness before a procedure. It works by blocking histamine, a substance released by the body during an allergic reaction. It belongs to a group of medications called antihistamines. This medicine may be used for other purposes; ask your health care provider or pharmacist if you have questions. COMMON BRAND NAME(S): ANX, Atarax, Rezine, Vistaril What should I tell my care team before I take this medication? They need to know if you have any of these conditions: Glaucoma Heart disease History of irregular heartbeat Kidney disease Liver disease Lung or breathing disease, like asthma Stomach or intestine problems Thyroid disease Trouble passing urine An unusual or allergic reaction to hydroxyzine, cetirizine, other medications, foods, dyes or preservatives Pregnant or trying to get pregnant Breast-feeding How should I use this medication? Take this medication by mouth with a full glass of water. Follow the directions on the prescription label. You may take this medication with food or on an empty stomach. Take your medication at regular intervals. Do not take your medication more often than directed. Talk to your care team regarding the use of this medication in children. Special care may be needed. While this medication may be prescribed for children as young as 6 years of age for selected conditions, precautions do apply. Patients over 65 years old may have a stronger reaction and need a smaller dose. Overdosage: If you think you have taken too much of this medicine contact a poison control center or emergency room at once. NOTE: This medicine is only for you. Do not share this medicine with others. What if I miss a dose? If you miss a dose, take it as soon as you can. If it is almost time for your next  dose, take only that dose. Do not take double or extra doses. What may interact with this medication? Do not take this medication with any of the following: Cisapride Dronedarone Pimozide Thioridazine This medication may also interact with the following: Alcohol Antihistamines for allergy, cough, and cold Atropine Barbiturate medications for sleep or seizures, like phenobarbital Certain antibiotics like erythromycin or clarithromycin Certain medications for anxiety or sleep Certain medications for bladder problems like oxybutynin, tolterodine Certain medications for depression or psychotic disturbances Certain medications for irregular heart beat Certain medications for Parkinson's disease like benztropine, trihexyphenidyl Certain medications for seizures like phenobarbital, primidone Certain medications for stomach problems like dicyclomine, hyoscyamine Certain medications for travel sickness like scopolamine Ipratropium Narcotic medications for pain Other medications that prolong the QT interval (which can cause an abnormal heart rhythm) like dofetilide This list may not describe all possible interactions. Give your health care provider a list of all the medicines, herbs, non-prescription drugs, or dietary supplements you use. Also tell them if you smoke, drink alcohol, or use illegal drugs. Some items may interact with your medicine. What should I watch for while using this medication? Tell your care team if your symptoms do not improve. You may get drowsy or dizzy. Do not drive, use machinery, or do anything that needs mental alertness until you know how this medication affects you. Do not stand or sit up quickly, especially if you are an older patient. This reduces the risk of dizzy or fainting spells. Alcohol may interfere with the effect of this medication. Avoid alcoholic drinks. Your mouth may get dry. Chewing sugarless gum or sucking hard   candy, and drinking plenty of water may  help. Contact your care team if the problem does not go away or is severe. This medication may cause dry eyes and blurred vision. If you wear contact lenses you may feel some discomfort. Lubricating drops may help. See your eye care specialist if the problem does not go away or is severe. If you are receiving skin tests for allergies, tell your care team you are using this medication. What side effects may I notice from receiving this medication? Side effects that you should report to your care team as soon as possible: Allergic reactions-skin rash, itching, hives, swelling of the face, lips, tongue, or throat Heart rhythm changes-fast or irregular heartbeat, dizziness, feeling faint or lightheaded, chest pain, trouble breathing Side effects that usually do not require medical attention (report to your care team if they continue or are bothersome): Confusion Drowsiness Dry mouth Hallucinations Headache This list may not describe all possible side effects. Call your doctor for medical advice about side effects. You may report side effects to FDA at 1-800-FDA-1088. Where should I keep my medication? Keep out of the reach of children and pets. Store at room temperature between 15 and 30 degrees C (59 and 86 degrees F). Keep container tightly closed. Throw away any unused medication after the expiration date. NOTE: This sheet is a summary. It may not cover all possible information. If you have questions about this medicine, talk to your doctor, pharmacist, or health care provider.  2022 Elsevier/Gold Standard (2020-05-18 15:19:25)  

## 2020-12-02 NOTE — Progress Notes (Signed)
Virtual Visit via Video Note  I connected with Kathryn Munoz on 12/02/20 at 10:40 AM EDT by a video enabled telemedicine application and verified that I am speaking with the correct person using two identifiers.  Location Provider Location : [facility] Patient Location : Car  Participants: Patient , Provider    I discussed the limitations of evaluation and management by telemedicine and the availability of in person appointments. The patient expressed understanding and agreed to proceed.     I discussed the assessment and treatment plan with the patient. The patient was provided an opportunity to ask questions and all were answered. The patient agreed with the plan and demonstrated an understanding of the instructions.   The patient was advised to call back or seek an in-person evaluation if the symptoms worsen or if the condition fails to improve as anticipated.   [place] MD OP Progress Note  12/02/2020 10:58 AM Kathryn Munoz  MRN:  [id number]  Chief Complaint:  Chief Complaint   Follow-up; Depression; Anxiety    HPI: Kathryn Munoz is a 45 year old African-American female, lives in [place], has a history of bipolar disorder, insomnia, OCD, insulin resistance, hypothyroidism, other specified eating disorder was evaluated by telemedicine today.  Patient today reports she has been having sleep problems on and off.  It is not that significant however she is interested in a medication as needed if possible to address it.  Patient reports overall her mood symptoms are stable.  Denies any significant anxiety or mood swings.  She reports that its the 75-monthanniversary of the death of her mother.  She hence has been grieving however has been coping okay.  She does not feel that sad anymore.  Patient denies any suicidality, homicidality or perceptual disturbances.  Patient reports work is going well.  Patient denies any other concerns today.  Visit Diagnosis:    ICD-10-CM   1. Bipolar  disorder, in full remission, most recent episode depressed ([place]  F31.76     2. Insomnia due to mental condition  F51.05 traZODone (DESYREL) 50 MG tablet    hydrOXYzine (VISTARIL) 25 MG capsule   mood    3. 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  F42.9       Past Psychiatric History: Reviewed past psychiatric history from progress note on 11/08/2017.  Past trials of Celexa.  Patient with history of suicide attempts when she overdosed on pills, was admitted to the hospital.  Reports another episode of overdose at the age of 89 however she slept through it and did not tell anyone.  Past Medical History:  Past Medical History:  Diagnosis Date   Abnormal mammogram    breast biopsy, [facility] 2014   ADD (attention deficit disorder)    Anxiety    Bipolar 2 disorder ([place]    Constipation    Depression    Dermatitis    Heavy menstrual bleeding    History of gestational diabetes    History of kidney stones 2014   Hypothyroidism    IFG (impaired fasting glucose)    Insulin resistance    Irregular periods    Joint pain    Multiple food allergies    Dates and nuts   Neuropathy    Obesity    OCD (obsessive compulsive disorder)    Prediabetes    Vitamin D deficiency disease     Past Surgical History:  Procedure Laterality Date   BREAST BIOPSY Left 2014   u[place]abx/clip-neg   INTRAUTERINE  DEVICE INSERTION  03/11/2012    Family Psychiatric History: Reviewed family psychiatric history from progress note on 11/08/2017  Family History:  Family History  Problem Relation Age of Onset   Diabetes Mother    Pancreatitis Mother    Hypertension Mother    Alcohol abuse Mother    Schizophrenia Mother    Depression Mother    Heart disease Mother    Stroke Mother    Thyroid disease Mother    Kidney disease Mother    Anxiety disorder Mother    Alcohol abuse Father    Diabetes Sister        pre-diabetic   Diabetes Brother    Drug abuse Brother    ADD / ADHD Son    Eczema  Son    Breast cancer Cousin        pat cousin   Anxiety disorder Other    Ovarian cancer Neg Hx    Social History: Reviewed social history from progress note on 11/08/2017 Social History   Socioeconomic History   Marital status: Married    Spouse name: Tamberlyn Midgley   Number of children: 2   Years of education: Not on file   Highest education level: Bachelor's degree (e.g., BA, AB, BS)  Occupational History   Occupation: English as a second language teacher  Tobacco Use   Smoking status: Never   Smokeless tobacco: Never  Vaping Use   Vaping Use: Never used  Substance and Sexual Activity   Alcohol use: No   Drug use: No   Sexual activity: Yes    Partners: Male    Birth control/protection: Other-see comments    Comment: nuvaring  Other Topics Concern   Not on file  Social History Narrative   Not on file   Social Determinants of Health   Financial Resource Strain: Low Risk    Difficulty of Paying Living Expenses: Not hard at all  Food Insecurity: No Food Insecurity   Worried About Charity fundraiser in the Last Year: Never true   [place] in the Last Year: Never true  Transportation Needs: No Transportation Needs   Lack of Transportation (Medical): No   Lack of Transportation (Non-Medical): No  Physical Activity: Insufficiently Active   Days of Exercise per Week: 7 days   Minutes of Exercise per Session: 10 min  Stress: No Stress Concern Present   Feeling of Stress : Not at all  Social Connections: Moderately Integrated   Frequency of Communication with Friends and Family: Three times a week   Frequency of Social Gatherings with Friends and Family: Three times a week   Attends Religious Services: 1 to 4 times per year   Active Member of Clubs or Organizations: No   Attends Archivist Meetings: Never   Marital Status: Married    Allergies:  Allergies  Allergen Reactions   Date Seed Extract  [Zizyphus Jujuba] Anaphylaxis, Cough, Itching and Swelling   Other Swelling     Metabolic Disorder Labs: Lab Results  Component Value Date   HGBA1C 5.3 08/05/2020   MPG 117 03/31/2019   MPG 91 05/09/2018   No results found for: PROLACTIN Lab Results  Component Value Date   CHOL 176 05/04/2020   TRIG 123 05/04/2020   HDL 61 05/04/2020   CHOLHDL 3.7 03/31/2019   VLDL 14 10/16/2016   LDLCALC 93 05/04/2020   LDLCALC 113 (H) 10/14/2019   Lab Results  Component Value Date   TSH 3.800 05/04/2020   TSH 2.210 07/17/2019  Therapeutic Level Labs: No results found for: LITHIUM No results found for: VALPROATE No components found for:  CBMZ  Current Medications: Current Outpatient Medications  Medication Sig Dispense Refill   hydrOXYzine (VISTARIL) 25 MG capsule Take 1-2 capsules (25-50 mg total) by mouth at bedtime as needed. For sleep 180 capsule 1   traZODone (DESYREL) 50 MG tablet Take 1-2 tablets (50-100 mg total) by mouth at bedtime as needed for sleep. 180 tablet 0   Cholecalciferol (VITAMIN D) 50 MCG (2000 UT) tablet Take 2,000 Units by mouth daily.     citalopram (CELEXA) 40 MG tablet Take 0.5 tablets (20 mg total) by mouth daily. 90 tablet 1   EPINEPHrine 0.3 mg/0.3 mL IJ SOAJ injection INJECT 0.3 MLS INTO THE MUSCLE ONCE FOR 1 DOSE. FOR LIFE-THREATENING ALLERGIC REACTION / ANAPHYLAXIS  1   etonogestrel-ethinyl estradiol (NUVARING) 0.12-0.015 MG/24HR vaginal ring INSERT 1 RING VAGINALLY AS DIRECTED. REMOVE AFTER 3 WEEKS & WAIT 7 DAYS BEFORE INSERTING A NEW RING 3 each 3   FLOWFLEX COVID-19 AG HOME TEST KIT FOLLOW INSTRUCTIONS INCLUDED WITH THE PACKAGE.     Insulin Pen Needle 32G X 6 MM MISC Will use with Saxenda daily 100 each 0   lamoTRIgine (LAMICTAL) 150 MG tablet Take 1 tablet (150 mg total) by mouth daily. Take along with 25 mg daily 90 tablet 1   lamoTRIgine (LAMICTAL) 25 MG tablet Take 1 tablet (25 mg total) by mouth daily. Take along with 150 mg daily 90 tablet 0   levothyroxine (SYNTHROID) 75 MCG tablet TAKE 1 TABLET BY MOUTH EVERY DAY  BEFORE BREAKFAST 90 tablet 0   linaclotide (LINZESS) 145 MCG CAPS capsule TAKE 1 CAPSULE BY MOUTH EVERY DAY BEFORE BREAKFAST 30 capsule 0   Liraglutide -Weight Management (SAXENDA) 18 MG/3ML SOPN INJECT 1.2 MG UNDER THE SKIN ONCE DAILY 15 mL 1   metFORMIN (GLUCOPHAGE-XR) 500 MG 24 hr tablet TAKE 3 TABLETS (1,500 MG TOTAL) BY MOUTH DAILY. 270 tablet 3   polyethylene glycol (MIRALAX / GLYCOLAX) packet Take 17 g by mouth as needed.      No current facility-administered medications for this visit.     Musculoskeletal: Strength & Muscle Tone:  UTA Gait & Station:  Seated Patient leans: N/A  Psychiatric Specialty Exam: Review of Systems  Psychiatric/Behavioral:  Positive for sleep disturbance.   All other systems reviewed and are negative.  There were no vitals taken for this visit.There is no height or weight on file to calculate BMI.  General Appearance: Casual  Eye Contact:  Fair  Speech:  Clear and Coherent  Volume:  Normal  Mood:  Euthymic  Affect:  Congruent  Thought Process:  Goal Directed and Descriptions of Associations: Intact  Orientation:  Full (Time, Place, and Person)  Thought Content: Logical   Suicidal Thoughts:  No  Homicidal Thoughts:  No  Memory:  Immediate;   Fair Recent;   Fair Remote;   Fair  Judgement:  Fair  Insight:  Fair  Psychomotor Activity:  Normal  Concentration:  Concentration: Fair and Attention Span: Fair  Recall:  [employer] of Knowledge: Fair  Language: Fair  Akathisia:  No  Handed:  Right  AIMS (if indicated): done  Assets:  Communication Skills Desire for [place] Talents/Skills Transportation Vocational/Educational  ADL's:  Intact  Cognition: WNL  Sleep:   restless at time   Screenings: [place] Video Visit from 10/28/2020 in [place] Total Score  0      GAD-7    Flowsheet Row Video Visit from 10/28/2020 in [place] Office Visit from 05/14/2020 in [facility]  Total GAD-7 Score 0 4      PHQ2-9    [place] Office Visit from 11/17/2020 in [facility] Video Visit from 10/28/2020 in [place] Video Visit from 10/14/2020 in [place] Counselor from 10/04/2020 in [place] Office Visit from 07/27/2020 in [place]  PHQ-2 Total Score 0 _0 0  PHQ-9 Total Score 0 3 18 -- 6      Flowsheet Row Counselor from 10/04/2020 in [place] Office Visit from 07/27/2020 in [place] Counselor from 07/14/2020 in [place] Low Risk Low Risk No Risk        Assessment and Plan: Kathryn Munoz is a 45 year old African-American female, married, employed, lives in [place], has a history of bipolar disorder, sleep problems, OCD, hypothyroidism was evaluated by telemedicine today.  Patient is grieving her mother who passed away 18 months ago however she reports she is managing it well.  Does report sleep problems on and off.  Discussed plan as noted below.  Plan Bipolar disorder in remission Lamotrigine 175 mg p.o. daily Celexa 20 mg p.o. daily-dose reduced due to sexual dysfunction   OCD-stable Continue CBT with Ms. Christina Hussami Celexa 20 mg p.o. daily  Insomnia-restless at times Increase trazodone to 100 mg p.o. nightly Start hydroxyzine 25-50 mg p.o. nightly as needed  Follow-up in clinic in 6 to 8 weeks or sooner if needed.  This note was generated in part or whole with voice recognition software. Voice recognition is usually quite accurate but there are transcription errors that can and very often do occur. I apologize for any typographical errors that were not detected and corrected.       Ursula Alert, MD 12/02/2020,  10:58 AM

## 2020-12-03 ENCOUNTER — Ambulatory Visit (INDEPENDENT_AMBULATORY_CARE_PROVIDER_SITE_OTHER): Payer: BC Managed Care – PPO | Admitting: Licensed Clinical Social Worker

## 2020-12-03 ENCOUNTER — Other Ambulatory Visit: Payer: Self-pay

## 2020-12-03 DIAGNOSIS — F3176 Bipolar disorder, in full remission, most recent episode depressed: Secondary | ICD-10-CM

## 2020-12-06 NOTE — Plan of Care (Signed)
  Problem: Bipolar Disorder CCP Problem  1 Alleviate depressive/manic symptoms and return to improved levels of effective functioning. Intervention: Assess emotional status and coping mechanisms Intervention: Assist with relaxation techniques, as appropriate (deep breathing exercises, meditation, guided imagery)   Problem: Bipolar Disorder CCP Problem  1 Alleviate depressive/manic symptoms and return to improved levels of effective functioning. Goal: LTG: Stabilize mood and increase goal-directed behavior: Input needed on appropriate metric Outcome: Progressing Goal: STG: [name] will attend at least 80% of scheduled follow-up medication management appointments Outcome: Progressing

## 2020-12-06 NOTE — Progress Notes (Signed)
Virtual Visit via Video Note  I connected with Kathryn Munoz on 12/03/20 at 11:00 AM EDT by a video enabled telemedicine application and verified that I am speaking with the correct person using two identifiers.  Location: Patient: home Provider: remote office [place], [place])   I discussed the limitations of evaluation and management by telemedicine and the availability of in person appointments. The patient expressed understanding and agreed to proceed.  I discussed the assessment and treatment plan with the patient. The patient was provided an opportunity to ask questions and all were answered. The patient agreed with the plan and demonstrated an understanding of the instructions.   The patient was advised to call back or seek an in-person evaluation if the symptoms worsen or if the condition fails to improve as anticipated.  I provided 30 minutes of non-face-to-face time during this encounter.   Tremel Setters R Winner Valeriano, LCSW   THERAPIST PROGRESS NOTE  Session Time: 11-11:30a  Participation Level: Active  Behavioral Response: Neat and Well GroomedAlertDepressed  Type of Therapy: Individual Therapy  Treatment Goals addressed: Coping  Interventions: CBT and DBT  Summary: Kathryn Munoz is a 45 y.o. female who presents with improving symptoms related to bipolar disorder diagnosis. Pt reports that she feels like she is doing a better job at regulating her thoughts and emotions.  "I didn't fall down the rabbithole when I reached the date of my mother's death".   Allowed pt safe space to explore and express thoughts and feelings about recent external stressors. Pt discussed relationship with husband and how he often puts pressure on pt to do activities and charitable events at the last minute. Pt reports that she had to set boundaries with her husband and feels proud that she did so to protect her space.  Discussed grief triggers, and binge eating triggers. Reviewed coping skills,  self regulation, and self management strategies. Pt is excited about son's upcoming 21st birthday party that she is hosting.  Continued recommendations are as follows: self care behaviors, positive social engagements, focusing on overall work/home/life balance, and focusing on positive physical and emotional wellness.    Suicidal/Homicidal: No  Therapist Response: Pt reports that she is regulating emotions better. Pt reports that she is setting boundaries to prevent feeling more anxious. Pt reports that she is using her coping skills to manage depression and other mood-related changes. These behaviors are reflective of both personal growth and progress. Treatment to continue as indicated.   Plan: Return again in 4 weeks.  Diagnosis: Axis I: Bipolar, in remission, most recent episode depressed    Axis II: No diagnosis    Ernest Haber Fenton Candee, LCSW 12/06/2020

## 2020-12-07 ENCOUNTER — Other Ambulatory Visit: Payer: Self-pay

## 2020-12-07 ENCOUNTER — Telehealth (INDEPENDENT_AMBULATORY_CARE_PROVIDER_SITE_OTHER): Payer: BC Managed Care – PPO | Admitting: Obstetrics and Gynecology

## 2020-12-07 ENCOUNTER — Encounter: Payer: Self-pay | Admitting: Obstetrics and Gynecology

## 2020-12-07 DIAGNOSIS — R6882 Decreased libido: Secondary | ICD-10-CM

## 2020-12-07 DIAGNOSIS — Z3009 Encounter for other general counseling and advice on contraception: Secondary | ICD-10-CM | POA: Diagnosis not present

## 2020-12-07 DIAGNOSIS — N941 Unspecified dyspareunia: Secondary | ICD-10-CM

## 2020-12-07 MED ORDER — ETONOGESTREL-ETHINYL ESTRADIOL 0.12-0.015 MG/24HR VA RING: 1.0000 | VAGINAL_RING | VAGINAL | 3 refills | Status: DC

## 2020-12-07 NOTE — Progress Notes (Signed)
HPI:      [name] is a 45 y.o. [phone] who LMP was No LMP recorded. (Menstrual status: Other).  Subjective:   She presents today via video visit, to discuss her birth control.  She has had an annual examination with her primary care physician and she reports that she also had a Pap smear.  Unfortunately they were not willing to refill her NuvaRing because they had not prescribed it originally.  In addition, the last time she and I spoke about her NuvaRing she was having issues with heavy breakthrough bleeding. Since that time she reports that her bleeding has become regular with the NuvaRing.  Her periods are often light.  She uses the ring 3 weeks in and 1 week out as prescribed.  She does have a period every month. Additionally, she does complain of some decreased sex drive but has already improved some of the symptoms by scheduling a date night where she can anticipate. She also states that on occasion she has deep pain with intercourse which she describes as occurring "toward the back".  She says it is not every time and is not disabling.    Hx: The following portions of the patient's history were reviewed and updated as appropriate:             She  has a past medical history of Abnormal mammogram, ADD (attention deficit disorder), Anxiety, Bipolar 2 disorder ([place]), Constipation, Depression, Dermatitis, Heavy menstrual bleeding, History of gestational diabetes, History of kidney stones (2014), Hypothyroidism, IFG (impaired fasting glucose), Insulin resistance, Irregular periods, Joint pain, Multiple food allergies, Neuropathy, Obesity, OCD (obsessive compulsive disorder), Prediabetes, and Vitamin D deficiency disease. She does not have any pertinent problems on file. She  has a past surgical history that includes Intrauterine device insertion (03/11/2012) and Breast biopsy (Left, 2014). Her family history includes ADD / ADHD in her son; Alcohol abuse in her father and mother; Anxiety  disorder in her mother and another family member; Breast cancer in her cousin; Depression in her mother; Diabetes in her brother, mother, and sister; Drug abuse in her brother; Eczema in her son; Heart disease in her mother; Hypertension in her mother; Kidney disease in her mother; Pancreatitis in her mother; Schizophrenia in her mother; Stroke in her mother; Thyroid disease in her mother. She  reports that she has never smoked. She has never used smokeless tobacco. She reports that she does not drink alcohol and does not use drugs. She has a current medication list which includes the following prescription(s): vitamin d, citalopram, epinephrine, etonogestrel-ethinyl estradiol, flowflex covid-19 ag home test, hydroxyzine, insulin pen needle, lamotrigine, lamotrigine, levothyroxine, linaclotide, saxenda, metformin, polyethylene glycol, and trazodone. She is allergic to date seed extract  [zizyphus jujuba] and other.       Review of Systems:  Review of Systems  Constitutional: Denied constitutional symptoms, night sweats, recent illness, fatigue, fever, insomnia and weight loss.  Eyes: Denied eye symptoms, eye pain, photophobia, vision change and visual disturbance.  Ears/Nose/Throat/Neck: Denied ear, nose, throat or neck symptoms, hearing loss, nasal discharge, sinus congestion and sore throat.  Cardiovascular: Denied cardiovascular symptoms, arrhythmia, chest pain/pressure, edema, exercise intolerance, orthopnea and palpitations.  Respiratory: Denied pulmonary symptoms, asthma, pleuritic pain, productive sputum, cough, dyspnea and wheezing.  Gastrointestinal: Denied, gastro-esophageal reflux, melena, nausea and vomiting.  Genitourinary: See HPI for additional information.  Musculoskeletal: Denied musculoskeletal symptoms, stiffness, swelling, muscle weakness and myalgia.  Dermatologic: Denied dermatology symptoms, rash and scar.  Neurologic: Denied neurology symptoms,  dizziness, headache, neck pain  and syncope.  Psychiatric: Denied psychiatric symptoms, anxiety and depression.  Endocrine: Denied endocrine symptoms including hot flashes and night sweats.   Meds:   Current Outpatient Medications on File Prior to Visit  Medication Sig Dispense Refill   Cholecalciferol (VITAMIN D) 50 MCG (2000 UT) tablet Take 2,000 Units by mouth daily.     citalopram (CELEXA) 40 MG tablet Take 0.5 tablets (20 mg total) by mouth daily. 90 tablet 1   EPINEPHrine 0.3 mg/0.3 mL IJ SOAJ injection INJECT 0.3 MLS INTO THE MUSCLE ONCE FOR 1 DOSE. FOR LIFE-THREATENING ALLERGIC REACTION / ANAPHYLAXIS  1   etonogestrel-ethinyl estradiol (NUVARING) 0.12-0.015 MG/24HR vaginal ring INSERT 1 RING VAGINALLY AS DIRECTED. REMOVE AFTER 3 WEEKS & WAIT 7 DAYS BEFORE INSERTING A NEW RING 3 each 3   FLOWFLEX COVID-19 AG HOME TEST KIT FOLLOW INSTRUCTIONS INCLUDED WITH THE PACKAGE.     hydrOXYzine (VISTARIL) 25 MG capsule Take 1-2 capsules (25-50 mg total) by mouth at bedtime as needed. For sleep 180 capsule 1   Insulin Pen Needle 32G X 6 MM MISC Will use with Saxenda daily 100 each 0   lamoTRIgine (LAMICTAL) 150 MG tablet Take 1 tablet (150 mg total) by mouth daily. Take along with 25 mg daily 90 tablet 1   lamoTRIgine (LAMICTAL) 25 MG tablet Take 1 tablet (25 mg total) by mouth daily. Take along with 150 mg daily 90 tablet 0   levothyroxine (SYNTHROID) 75 MCG tablet TAKE 1 TABLET BY MOUTH EVERY DAY BEFORE BREAKFAST 90 tablet 0   linaclotide (LINZESS) 145 MCG CAPS capsule TAKE 1 CAPSULE BY MOUTH EVERY DAY BEFORE BREAKFAST 30 capsule 0   Liraglutide -Weight Management (SAXENDA) 18 MG/3ML SOPN INJECT 1.2 MG UNDER THE SKIN ONCE DAILY 15 mL 1   metFORMIN (GLUCOPHAGE-XR) 500 MG 24 hr tablet TAKE 3 TABLETS (1,500 MG TOTAL) BY MOUTH DAILY. 270 tablet 3   polyethylene glycol (MIRALAX / GLYCOLAX) packet Take 17 g by mouth as needed.      traZODone (DESYREL) 50 MG tablet Take 1-2 tablets (50-100 mg total) by mouth at bedtime as needed for  sleep. 180 tablet 0   No current facility-administered medications on file prior to visit.      Objective:     There were no vitals filed for this visit. There were no vitals filed for this visit.                      Assessment:    G2P1102 Patient Active Problem List   Diagnosis Date Noted   Vertigo    Drug-induced constipation 05/24/2020   Other hyperlipidemia 05/24/2020   Insulin resistance 01/08/2020   Bipolar disorder, in full remission, most recent episode depressed ([place]) 12/23/2019   Heavy menstrual period 06/28/2019   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 01/01/2019   Bipolar 2 disorder, major depressive episode ([place]) 09/05/2018   Insomnia due to mental condition 09/05/2018   Class 3 severe obesity with serious comorbidity and body mass index (BMI) of 40.0 to 44.9 in adult [facility]) 01/74/9449   Metabolic syndrome 67/59/1638   Acanthosis nigricans 10/22/2017   Moderate episode of recurrent major depressive disorder ([place]) 10/22/2017   Acid reflux 04/01/2017   Anxiety 06/01/2016   Morbid obesity with BMI of 40.0-44.9, adult ([place]) 03/12/2015   Peripheral neuropathy 10/09/2014   Hypothyroidism    IFG (impaired fasting glucose)    Vitamin D deficiency    Neuropathy  1. Birth control counseling   2. Dyspareunia in female   3. Decreased sex drive        Plan:            1.  Patient would like to continue her NuvaRing.  2.  We have discussed the multifactorial nature of sex drive and I have encouraged her to continue with date night and relationship building.  Additionally I have discussed the possibility of medication in the future if her issue does not continue to improve.  3.  We have discussed dyspareunia, and she will try ibuprofen prior to intercourse and see if this makes a difference.  If not we have discussed the possibility of future ultrasound to rule out ovarian cysts or other uterine/pelvic abnormalities.  Orders No orders of  the defined types were placed in this encounter.   No orders of the defined types were placed in this encounter.     F/U  No follow-ups on file. I spent 21 minutes involved in the care of this patient preparing to see the patient by obtaining and reviewing her medical history (including labs, imaging tests and prior procedures), documenting clinical information in the electronic health record (EHR), counseling and coordinating care plans, writing and sending prescriptions, ordering tests or procedures and in direct communicating with the patient and medical staff discussing pertinent items from her history and physical exam.  Finis Bud, M.D. 12/07/2020 8:00 AM

## 2020-12-23 ENCOUNTER — Other Ambulatory Visit: Payer: Self-pay

## 2020-12-23 ENCOUNTER — Ambulatory Visit (INDEPENDENT_AMBULATORY_CARE_PROVIDER_SITE_OTHER): Payer: BC Managed Care – PPO | Admitting: Family Medicine

## 2020-12-23 ENCOUNTER — Encounter (INDEPENDENT_AMBULATORY_CARE_PROVIDER_SITE_OTHER): Payer: Self-pay | Admitting: Family Medicine

## 2020-12-23 VITALS — BP 119/76 | HR 79 | Temp 98.4°F | Ht 65.0 in | Wt 243.0 lb

## 2020-12-23 DIAGNOSIS — E8881 Metabolic syndrome: Secondary | ICD-10-CM

## 2020-12-23 DIAGNOSIS — K5903 Drug induced constipation: Secondary | ICD-10-CM | POA: Diagnosis not present

## 2020-12-23 DIAGNOSIS — Z6841 Body Mass Index (BMI) 40.0 and over, adult: Secondary | ICD-10-CM | POA: Diagnosis not present

## 2020-12-23 DIAGNOSIS — E88819 Insulin resistance, unspecified: Secondary | ICD-10-CM

## 2020-12-23 MED ORDER — LINACLOTIDE 145 MCG PO CAPS: ORAL_CAPSULE | ORAL | 0 refills | Status: DC

## 2020-12-23 NOTE — Progress Notes (Signed)
Chief Complaint:   OBESITY Kathryn Munoz is here to discuss her progress with her obesity treatment plan along with follow-up of her obesity related diagnoses. Kathryn Munoz is on the Category 3 Plan and states she is following her eating plan approximately 0% of the time. Kathryn Munoz states she is 0 0 minutes 0 times per week.  Today's visit was #: 24 Starting weight: 252 lbs Starting date: 07/17/2019 Today's weight: 243 lbs Today's date: 12/23/2020 Total lbs lost to date: 9 lbs Total lbs lost since last in-office visit: 1 lb  Interim History: Kathryn Munoz is on Saxenda 1.2 mg daily. Her son is on a travel ball team and she travels every weekend which makes it difficult to adhere to plan. She has not been packing her lunch over the past few weeks. She denies intake of sugar sweetened beverages. She says she has not been eating a good breakfast. She does not shop weekly for groceries or meal plan. Her family tends to buy food as needed.  Subjective:   1. Drug-induced constipation Kathryn Munoz's constipation is well controlled withLinzess.  2. Insulin resistance Kathryn Munoz is on Saxenda 1.2 mg. She denies excessive hunger. Her last fasting Insulin was elevated at 42.7. Her A1C was within normal limits at (5.3).  Lab Results  Component Value Date   INSULIN 42.7 (H) 08/05/2020   INSULIN 46.7 (H) 05/04/2020   INSULIN 29.0 (H) 01/07/2020   INSULIN 32.5 (H) 10/14/2019   INSULIN 19.6 07/17/2019   Lab Results  Component Value Date   HGBA1C 5.3 08/05/2020    Assessment/Plan:   1. Drug-induced constipation Well controled with Linzess.  We will refill Linzess 145 mg daily for 1 month with no refills.   - linaclotide (LINZESS) 145 MCG CAPS capsule; TAKE 1 CAPSULE BY MOUTH EVERY DAY BEFORE BREAKFAST  Dispense: 30 capsule; Refill: 0  2. Insulin resistance Kathryn Munoz agrees to increase dose of Saxenda to 1.8 mg daily. She will consider switching to [facility] next office visit.   3. Obesity with current BMI  40.44 Kathryn Munoz is currently in the action stage of change. As such, her goal is to continue with weight loss efforts. She has agreed to keeping a food journal and adhering to recommended goals of 1500-1600 calories and 90 grams of protein.   Kathryn Munoz will increase dose of Saxenda to 1.8 mg daily.  Exercise goals: No exercise has been prescribed at this time.  Behavioral modification strategies: increasing lean protein intake, decreasing simple carbohydrates, meal planning and cooking strategies, and travel eating strategies.  Kathryn Munoz has agreed to follow-up with our clinic in 3-4 weeks.  Objective:   Blood pressure 119/76, pulse 79, temperature 98.4 F (36.9 C), height 5\' 5"  (1.651 m), weight 243 lb (110.2 kg), SpO2 96 %. Body mass index is 40.44 kg/m.  General: Cooperative, alert, well developed, in no acute distress. HEENT: Conjunctivae and lids unremarkable. Cardiovascular: Regular rhythm.  Lungs: Normal work of breathing. Neurologic: No focal deficits.   Lab Results  Component Value Date   CREATININE 0.91 08/05/2020   BUN 8 08/05/2020   NA 142 08/05/2020   K 4.6 08/05/2020   CL 105 08/05/2020   CO2 23 08/05/2020   Lab Results  Component Value Date   ALT 8 08/05/2020   AST 13 08/05/2020   ALKPHOS 60 08/05/2020   BILITOT 0.5 08/05/2020   Lab Results  Component Value Date   HGBA1C 5.3 08/05/2020   HGBA1C 5.0 05/04/2020   HGBA1C 5.2 01/07/2020   HGBA1C 5.2 10/14/2019  HGBA1C 5.5 07/17/2019   Lab Results  Component Value Date   INSULIN 42.7 (H) 08/05/2020   INSULIN 46.7 (H) 05/04/2020   INSULIN 29.0 (H) 01/07/2020   INSULIN 32.5 (H) 10/14/2019   INSULIN 19.6 07/17/2019   Lab Results  Component Value Date   TSH 3.800 05/04/2020   Lab Results  Component Value Date   CHOL 176 05/04/2020   HDL 61 05/04/2020   LDLCALC 93 05/04/2020   TRIG 123 05/04/2020   CHOLHDL 3.7 03/31/2019   Lab Results  Component Value Date   VD25OH 49.6 08/05/2020   VD25OH 37.7  05/04/2020   VD25OH 47.4 01/07/2020   Lab Results  Component Value Date   WBC 6.7 05/14/2020   HGB 12.3 05/14/2020   HCT 37.3 05/14/2020   MCV 94.2 05/14/2020   PLT 349 05/14/2020   No results found for: IRON, TIBC, FERRITIN  Attestation Statements:   Reviewed by clinician on day of visit: allergies, medications, problem list, medical history, surgical history, family history, social history, and previous encounter notes.  I, Kathryn Munoz, RMA, am acting as Energy manager for [place], FNP.   I have reviewed the above documentation for accuracy and completeness, and I agree with the above. -  Jesse Sans, FNP

## 2020-12-31 ENCOUNTER — Other Ambulatory Visit: Payer: Self-pay

## 2020-12-31 ENCOUNTER — Ambulatory Visit (INDEPENDENT_AMBULATORY_CARE_PROVIDER_SITE_OTHER): Payer: BC Managed Care – PPO | Admitting: Licensed Clinical Social Worker

## 2020-12-31 DIAGNOSIS — F3176 Bipolar disorder, in full remission, most recent episode depressed: Secondary | ICD-10-CM

## 2020-12-31 NOTE — Progress Notes (Signed)
Virtual Visit via Video Note  I connected with Kathryn Munoz on 12/31/20 at 11:00 AM EDT by a video enabled telemedicine application and verified that I am speaking with the correct person using two identifiers.  Location: Patient: home/personal vehicle Provider: remote office [place], [place])   I discussed the limitations of evaluation and management by telemedicine and the availability of in person appointments. The patient expressed understanding and agreed to proceed.  I discussed the assessment and treatment plan with the patient. The patient was provided an opportunity to ask questions and all were answered. The patient agreed with the plan and demonstrated an understanding of the instructions.   The patient was advised to call back or seek an in-person evaluation if the symptoms worsen or if the condition fails to improve as anticipated.  I provided 45 minutes of non-face-to-face time during this encounter.   Ryin Schillo R Jaelon Gatley, LCSW   THERAPIST PROGRESS NOTE  Session Time: 11-1145a  Participation Level: Active  Behavioral Response: Neat and Well GroomedAlertDepressed  Type of Therapy: Individual Therapy  Treatment Goals addressed:  Goal: LTG: Stabilize mood and increase goal-directed behavior: Input needed on appropriate metric Outcome: Progressing  Goal: STG: [name] will attend at least 80% of scheduled follow-up medication management appointments Outcome: Progressing Interventions:  Intervention: Work with patient to identify the major components of a recent episode of anxiety: physical symptoms, major thoughts and images, and major behaviors they experienced  Intervention: Encourage changes to improve social interaction  Intervention: Discuss self-management skills  Intervention: Assess emotional status and coping mechanisms  Summary: Kathryn Munoz is a 45 y.o. female who presents with improving symptoms related to bipolar disorder diagnosis. Pt  reports that overall mood is stable and that she feels she is managing situational stressors well.  Pt reports that she is compliant with medication and is getting good quality and quantity of sleep.   Allowed pt to explore and express thoughts and feelings associated with recent life situations and external stressors. Discussed relationship with brother, and worries pt has about her brother and his life choices. Encouraged boundaries and setting limits. Pt has good self awareness about this. Discussed relationship with husband and sons. Pt recently got back from girls weekend in [place] and had a great time. Explored recent thought that two boys that sons are friends with are truly her biological nephews. Pt wants to approach child (teenager) about being biological family but not sure if its her place. Explored how sure pt is about the boys--and if the boys even know that they are adopted. Discussed how complicated this situation could be if boys don't know they are adopted.   Pt husband is continuing with law enforcement training and is excited about this.   Pt visited her mom's grave recently and cleaned up everything around this--found the experience very healing.  Continued recommendations are as follows: self care behaviors, positive social engagements, focusing on overall work/home/life balance, and focusing on positive physical and emotional wellness.   Suicidal/Homicidal: No  Therapist Response: Pt is continuing to apply interventions learned in session into daily life situations. Pt is currently on track to meet goals utilizing interventions mentioned above. Personal growth and progress noted. Treatment to continue as indicated.   Plan: Return again in 4 weeks.  Diagnosis: Axis I: Bipolar disorder, depressed, remission    Axis II: No diagnosis    [place] Cash Duce, LCSW 12/31/2020

## 2021-01-03 NOTE — Plan of Care (Signed)
  Problem: Bipolar Disorder CCP Problem  1 Alleviate depressive/manic symptoms and return to improved levels of effective functioning. Goal: LTG: Stabilize mood and increase goal-directed behavior: Input needed on appropriate metric Outcome: Progressing Goal: STG: [name] will attend at least 80% of scheduled follow-up medication management appointments Outcome: Progressing Intervention: Work with patient to identify the major components of a recent episode of anxiety: physical symptoms, major thoughts and images, and major behaviors they experienced Intervention: Encourage changes to improve social interaction Intervention: Discuss self-management skills Intervention: Assess emotional status and coping mechanisms

## 2021-01-12 ENCOUNTER — Other Ambulatory Visit: Payer: Self-pay

## 2021-01-12 ENCOUNTER — Encounter: Payer: Self-pay | Admitting: Psychiatry

## 2021-01-12 ENCOUNTER — Telehealth (INDEPENDENT_AMBULATORY_CARE_PROVIDER_SITE_OTHER): Payer: BC Managed Care – PPO | Admitting: Psychiatry

## 2021-01-12 DIAGNOSIS — F429 Obsessive-compulsive disorder, unspecified: Secondary | ICD-10-CM

## 2021-01-12 DIAGNOSIS — F3176 Bipolar disorder, in full remission, most recent episode depressed: Secondary | ICD-10-CM | POA: Diagnosis not present

## 2021-01-12 DIAGNOSIS — F5105 Insomnia due to other mental disorder: Secondary | ICD-10-CM

## 2021-01-12 MED ORDER — CITALOPRAM HYDROBROMIDE 20 MG PO TABS: 20.0000 mg | ORAL_TABLET | Freq: Every day | ORAL | 1 refills | Status: DC

## 2021-01-12 MED ORDER — HYDROXYZINE PAMOATE 25 MG PO CAPS: 25.0000 mg | ORAL_CAPSULE | Freq: Every evening | ORAL | 1 refills | Status: DC | PRN

## 2021-01-12 MED ORDER — LAMOTRIGINE 25 MG PO TABS: 25.0000 mg | ORAL_TABLET | Freq: Every day | ORAL | 0 refills | Status: DC

## 2021-01-12 NOTE — Progress Notes (Signed)
Virtual Visit via Video Note  I connected with Kathryn Munoz on 01/12/21 at 10:40 AM EDT by a video enabled telemedicine application and verified that I am speaking with the correct person using two identifiers.  Location Provider Location : [facility] Patient Location : Car  Participants: Patient , Provider    I discussed the limitations of evaluation and management by telemedicine and the availability of in person appointments. The patient expressed understanding and agreed to proceed.  I discussed the assessment and treatment plan with the patient. The patient was provided an opportunity to ask questions and all were answered. The patient agreed with the plan and demonstrated an understanding of the instructions.   The patient was advised to call back or seek an in-person evaluation if the symptoms worsen or if the condition fails to improve as anticipated.   [place] MD OP Progress Note  01/12/2021 11:03 AM Kathryn Munoz  MRN:  [id number]  Chief Complaint:  Chief Complaint   Follow-up; Depression    HPI: Kathryn Munoz is a 45 year old African-American female, lives in [place], has a history of bipolar disorder, insomnia, OCD, insulin resistance, hypothyroidism, other specified eating disorder was evaluated by telemedicine today.  Patient reports she continues to struggle with sleep problems.  She reports she does not have a good sleep hygiene now since she has to drop off her son to work late at night.  Instead of going to bed at 9 currently she goes to bed at 10 or 10:15 PM and still has to wake up at 5 AM.  She reports she feels drained out throughout the day and feels as though she wants to take a nap during her lunch hour.  She currently takes trazodone higher dosage.  She lost prescription for hydroxyzine, does not know where it went and never took it.  Patient denies any significant mood symptoms.  Denies any suicidality, homicidality or perceptual disturbances.  Patient is  compliant on her medications otherwise and denies side effects.  Patient denies any other concerns today.  Visit Diagnosis:    ICD-10-CM   1. Bipolar disorder, in full remission, most recent episode depressed (HCC)  F31.76 lamoTRIgine (LAMICTAL) 25 MG tablet   type 2    2. Insomnia due to mental condition  F51.05 hydrOXYzine (VISTARIL) 25 MG capsule   mood    3. 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  F42.9 citalopram (CELEXA) 20 MG tablet      Past Psychiatric History: Reviewed past psychiatric history from progress note on 11/08/2017.  Past trials of Celexa.  Patient with history of suicide attempt at the age of 68, she overdosed on pills, was admitted to the hospital.  Reports another episode at the age of 29, slept through it and did not tell anyone.  Past Medical History:  Past Medical History:  Diagnosis Date   Abnormal mammogram    breast biopsy, [place] 2014   ADD (attention deficit disorder)    Anxiety    Bipolar 2 disorder ([place])    Constipation    Depression    Dermatitis    Heavy menstrual bleeding    History of gestational diabetes    History of kidney stones 2014   Hypothyroidism    IFG (impaired fasting glucose)    Insulin resistance    Irregular periods    Joint pain    Multiple food allergies    Dates and nuts   Neuropathy    Obesity    OCD (obsessive  compulsive disorder)    Prediabetes    Vitamin D deficiency disease     Past Surgical History:  Procedure Laterality Date   BREAST BIOPSY Left 2014   [place] bx/clip-neg   INTRAUTERINE DEVICE INSERTION  03/11/2012    Family Psychiatric History: Reviewed family psychiatric history from progress note on 11/08/2017  Family History:  Family History  Problem Relation Age of Onset   Diabetes Mother    Pancreatitis Mother    Hypertension Mother    Alcohol abuse Mother    Schizophrenia Mother    Depression Mother    Heart disease Mother    Stroke Mother    Thyroid disease Mother    Kidney  disease Mother    Anxiety disorder Mother    Alcohol abuse Father    Diabetes Sister        pre-diabetic   Diabetes Brother    Drug abuse Brother    ADD / ADHD Son    Eczema Son    Breast cancer Cousin        pat cousin   Anxiety disorder Other    Ovarian cancer Neg Hx     Social History: Reviewed social history from progress note on 11/08/2017 Social History   Socioeconomic History   Marital status: Married    Spouse name: Kathryn Munoz   Number of children: 2   Years of education: Not on file   Highest education level: Bachelor's degree (e.g., BA, AB, BS)  Occupational History   Occupation: English as a second language teacher  Tobacco Use   Smoking status: Never   Smokeless tobacco: Never  Vaping Use   Vaping Use: Never used  Substance and Sexual Activity   Alcohol use: No   Drug use: No   Sexual activity: Yes    Partners: Male    Birth control/protection: Other-see comments    Comment: nuvaring  Other Topics Concern   Not on file  Social History Narrative   Not on file   Social Determinants of Health   Financial Resource Strain: Low Risk    Difficulty of Paying Living Expenses: Not hard at all  Food Insecurity: No Food Insecurity   Worried About Charity fundraiser in the Last Year: Never true   [place] in the Last Year: Never true  Transportation Needs: No Transportation Needs   Lack of Transportation (Medical): No   Lack of Transportation (Non-Medical): No  Physical Activity: Insufficiently Active   Days of Exercise per Week: 7 days   Minutes of Exercise per Session: 10 min  Stress: No Stress Concern Present   Feeling of Stress : Not at all  Social Connections: Moderately Integrated   Frequency of Communication with Friends and Family: Three times a week   Frequency of Social Gatherings with Friends and Family: Three times a week   Attends Religious Services: 1 to 4 times per year   Active Member of Clubs or Organizations: No   Attends Archivist Meetings:  Never   Marital Status: Married    Allergies:  Allergies  Allergen Reactions   Date Seed Extract  [Zizyphus Jujuba] Anaphylaxis, Cough, Itching and Swelling   Other Swelling    Metabolic Disorder Labs: Lab Results  Component Value Date   HGBA1C 5.3 08/05/2020   MPG 117 03/31/2019   MPG 91 05/09/2018   No results found for: PROLACTIN Lab Results  Component Value Date   CHOL 176 05/04/2020   TRIG 123 05/04/2020   HDL 61 05/04/2020  CHOLHDL 3.7 03/31/2019   VLDL 14 10/16/2016   LDLCALC 93 05/04/2020   LDLCALC 113 (H) 10/14/2019   Lab Results  Component Value Date   TSH 3.800 05/04/2020   TSH 2.210 07/17/2019    Therapeutic Level Labs: No results found for: LITHIUM No results found for: VALPROATE No components found for:  CBMZ  Current Medications: Current Outpatient Medications  Medication Sig Dispense Refill   citalopram (CELEXA) 20 MG tablet Take 1 tablet (20 mg total) by mouth daily. 90 tablet 1   Cholecalciferol (VITAMIN D) 50 MCG (2000 UT) tablet Take 2,000 Units by mouth daily.     EPINEPHrine 0.3 mg/0.3 mL IJ SOAJ injection INJECT 0.3 MLS INTO THE MUSCLE ONCE FOR 1 DOSE. FOR LIFE-THREATENING ALLERGIC REACTION / ANAPHYLAXIS  1   etonogestrel-ethinyl estradiol (NUVARING) 0.12-0.015 MG/24HR vaginal ring Place 1 each vaginally every 28 (twenty-eight) days. Insert vaginally and leave in place for 3 consecutive weeks, then remove for 1 week. 3 each 3   FLOWFLEX COVID-19 AG HOME TEST KIT FOLLOW INSTRUCTIONS INCLUDED WITH THE PACKAGE.     hydrOXYzine (VISTARIL) 25 MG capsule Take 1-2 capsules (25-50 mg total) by mouth at bedtime as needed. For sleep 60 capsule 1   Insulin Pen Needle 32G X 6 MM MISC Will use with Saxenda daily 100 each 0   lamoTRIgine (LAMICTAL) 150 MG tablet Take 1 tablet (150 mg total) by mouth daily. Take along with 25 mg daily 90 tablet 1   lamoTRIgine (LAMICTAL) 25 MG tablet Take 1 tablet (25 mg total) by mouth daily. Take along with 150 mg daily  90 tablet 0   levothyroxine (SYNTHROID) 75 MCG tablet TAKE 1 TABLET BY MOUTH EVERY DAY BEFORE BREAKFAST 90 tablet 0   linaclotide (LINZESS) 145 MCG CAPS capsule TAKE 1 CAPSULE BY MOUTH EVERY DAY BEFORE BREAKFAST 30 capsule 0   Liraglutide -Weight Management (SAXENDA) 18 MG/3ML SOPN INJECT 1.2 MG UNDER THE SKIN ONCE DAILY 15 mL 1   metFORMIN (GLUCOPHAGE-XR) 500 MG 24 hr tablet TAKE 3 TABLETS (1,500 MG TOTAL) BY MOUTH DAILY. 270 tablet 3   polyethylene glycol (MIRALAX / GLYCOLAX) packet Take 17 g by mouth as needed.      traZODone (DESYREL) 50 MG tablet Take 1-2 tablets (50-100 mg total) by mouth at bedtime as needed for sleep. 180 tablet 0   No current facility-administered medications for this visit.     Musculoskeletal: Strength & Muscle Tone:  uta Gait & Station:  seated Patient leans: N/A  Psychiatric Specialty Exam: Review of Systems  Constitutional:  Positive for fatigue.  Psychiatric/Behavioral:  Positive for sleep disturbance.   All other systems reviewed and are negative.  There were no vitals taken for this visit.There is no height or weight on file to calculate BMI.  General Appearance: Fairly Groomed  Eye Contact:  Fair  Speech:  Clear and Coherent  Volume:  Normal  Mood:  Euthymic  Affect:  Congruent  Thought Process:  Goal Directed and Descriptions of Associations: Intact  Orientation:  Full (Time, Place, and Person)  Thought Content: Logical   Suicidal Thoughts:  No  Homicidal Thoughts:  No  Memory:  Immediate;   Fair Recent;   Fair Remote;   Fair  Judgement:  Fair  Insight:  Fair  Psychomotor Activity:  Normal  Concentration:  Concentration: Fair and Attention Span: Fair  Recall:  [employer] of Knowledge: Fair  Language: Fair  Akathisia:  No  Handed:  Right  AIMS (if indicated): done  Assets:  Communication Skills Desire for Improvement Housing Social Support  ADL's:  Intact  Cognition: WNL  Sleep:  Poor   Screenings: AIMS    Flowsheet Row  Video Visit from 10/28/2020 in [place] Total Score 0      GAD-7    Flowsheet Row Video Visit from 10/28/2020 in [place] Office Visit from 05/14/2020 in [facility]  Total GAD-7 Score 0 4      PHQ2-9    Flowsheet Row Counselor from 12/31/2020 in [place] Office Visit from 11/17/2020 in [facility] Video Visit from 10/28/2020 in [place] Video Visit from 10/14/2020 in [place] Counselor from 10/04/2020 in [place]  PHQ-2 Total Score 0 0 _0 PHQ-9 Total Score -- 0 3 18 --      Flowsheet Row Counselor from 12/31/2020 in [place] Counselor from 10/04/2020 in [place] Office Visit from 07/27/2020 in [place] Low Risk Low Risk Low Risk        Assessment and Plan: Kathryn Munoz is a 45 year old African-American female, married, employed, lives in [place], has a history of bipolar disorder, sleep problems, OCD, hypothyroidism was evaluated by telemedicine today.  Patient currently struggles with sleep, will benefit from the following plan.  Plan Bipolar disorder in remission Lamotrigine 175 mg p.o. daily Celexa 20 mg p.o. daily-dose reduced due to sexual dysfunction  OCD-stable Continue CBT with [name] Celexa 20 mg p.o. daily  Insomnia-unstable Continue sleep hygiene techniques. Discussed with patient to reduce the trazodone to 50 mg at bedtime if she goes to bed late.  She could take trazodone 100 mg on other nights. Start hydroxyzine 25-50 mg p.o. nightly as needed-she was noncompliant.  Follow-up in clinic in 3 weeks or sooner if needed.  This note was generated in part or whole with voice recognition software. Voice  recognition is usually quite accurate but there are transcription errors that can and very often do occur. I apologize for any typographical errors that were not detected and corrected.       Ursula Alert, MD 01/13/2021, 9:54 AM

## 2021-01-18 ENCOUNTER — Ambulatory Visit (INDEPENDENT_AMBULATORY_CARE_PROVIDER_SITE_OTHER): Payer: BC Managed Care – PPO | Admitting: Family Medicine

## 2021-01-18 ENCOUNTER — Other Ambulatory Visit: Payer: Self-pay

## 2021-01-18 ENCOUNTER — Encounter (INDEPENDENT_AMBULATORY_CARE_PROVIDER_SITE_OTHER): Payer: Self-pay | Admitting: Family Medicine

## 2021-01-18 VITALS — BP 111/64 | HR 76 | Temp 98.1°F | Ht 65.0 in | Wt 241.0 lb

## 2021-01-18 DIAGNOSIS — E88819 Insulin resistance, unspecified: Secondary | ICD-10-CM

## 2021-01-18 DIAGNOSIS — E8881 Metabolic syndrome: Secondary | ICD-10-CM | POA: Diagnosis not present

## 2021-01-18 DIAGNOSIS — E559 Vitamin D deficiency, unspecified: Secondary | ICD-10-CM | POA: Diagnosis not present

## 2021-01-18 DIAGNOSIS — Z6841 Body Mass Index (BMI) 40.0 and over, adult: Secondary | ICD-10-CM

## 2021-01-18 MED ORDER — INSULIN PEN NEEDLE 32G X 6 MM MISC: 0 refills | Status: DC

## 2021-01-18 MED ORDER — SAXENDA 18 MG/3ML ~~LOC~~ SOPN: 3.0000 mg | PEN_INJECTOR | Freq: Every day | SUBCUTANEOUS | 0 refills | Status: DC

## 2021-01-18 NOTE — Progress Notes (Signed)
Chief Complaint:   OBESITY Kathryn Munoz is here to discuss her progress with her obesity treatment plan along with follow-up of her obesity related diagnoses. Kathryn Munoz is on keeping a food journal and adhering to recommended goals of 1500-1600 calories and 90 grams of protein and Category 3 and states she is following her eating plan approximately 0% of the time. Kathryn Munoz states she is dancing for 30 minutes 2 times per week.  Today's visit was #: 25 Starting weight: 252 lbs Starting date: 07/17/2019 Today's weight: 241 lbs Today's date: 01/18/2021 Total lbs lost to date: 11 lbs Total lbs lost since last in-office visit: 2 lbs  Interim History: Kathryn Munoz notes her family is eating more at home. She feels she is eating smaller portions with Saxenda. She adheres to the plan better during the week than on weekends when she is going to ball games with her sons. However, portions are smaller at the ball field than they used to be. She denies intake of sugar sweetened beverages. She is working on drinking more water.  She notes sometimes she feels like giving up on weight loss.  She had lost 27 lbs at one point during the course of her visits with [place] but then gained it back.  Subjective:   1. Insulin resistance Kathryn Munoz's Saxenda dose was increased to 1.8 mg daily at last office visit. She is tolerating it well. She is also on Metformin.   Lab Results  Component Value Date   INSULIN 42.7 (H) 08/05/2020   INSULIN 46.7 (H) 05/04/2020   INSULIN 29.0 (H) 01/07/2020   INSULIN 32.5 (H) 10/14/2019   INSULIN 19.6 07/17/2019   Lab Results  Component Value Date   HGBA1C 5.3 08/05/2020    2. Vitamin D deficiency Kathryn Munoz's Vitamin D is nearly at goal (49). She is on OTC Vitamin D 2000 IU.  Lab Results  Component Value Date   VD25OH 49.6 08/05/2020   VD25OH 37.7 05/04/2020   VD25OH 47.4 01/07/2020    Assessment/Plan:   1. Insulin resistance Kathryn Munoz may increase dose to 2.4 mg weekly.   -  Insulin Pen Needle 32G X 6 MM MISC; Will use with Saxenda daily  Dispense: 100 each; Refill: 0 - Liraglutide -Weight Management (SAXENDA) 18 MG/3ML SOPN; Inject 3 mg into the skin daily.  Dispense: 15 mL; Refill: 0  2. Vitamin D deficiency  Kathryn Munoz agrees to continue to take OTC Vitamin D 2,000 IU daily and she will follow-up for routine testing of Vitamin D, at least 2-3 times per year to avoid over-replacement.  3. Obesity with current BMI 40.1 Kathryn Munoz is currently in the action stage of change. As such, her goal is to continue with weight loss efforts. She has agreed to keeping a food journal and adhering to recommended goals of 1500-1600 calories and 90 grams of protein daily.   Kathryn Munoz will try to journal 4 days per week. She will follow [facility] other days.   Exercise goals:  As is.  Behavioral modification strategies: increasing lean protein intake and decreasing simple carbohydrates.  Kathryn Munoz has agreed to follow-up with our clinic in 2-3 weeks (fasting).  Objective:   Blood pressure 111/64, pulse 76, temperature 98.1 F (36.7 C), height 5\' 5"  (1.651 m), weight 241 lb (109.3 kg), SpO2 97 %. Body mass index is 40.1 kg/m.  General: Cooperative, alert, well developed, in no acute distress. HEENT: Conjunctivae and lids unremarkable. Cardiovascular: Regular rhythm.  Lungs: Normal work of breathing. Neurologic: No focal deficits.  Lab Results  Component Value Date   CREATININE 0.91 08/05/2020   BUN 8 08/05/2020   NA 142 08/05/2020   K 4.6 08/05/2020   CL 105 08/05/2020   CO2 23 08/05/2020   Lab Results  Component Value Date   ALT 8 08/05/2020   AST 13 08/05/2020   ALKPHOS 60 08/05/2020   BILITOT 0.5 08/05/2020   Lab Results  Component Value Date   HGBA1C 5.3 08/05/2020   HGBA1C 5.0 05/04/2020   HGBA1C 5.2 01/07/2020   HGBA1C 5.2 10/14/2019   HGBA1C 5.5 07/17/2019   Lab Results  Component Value Date   INSULIN 42.7 (H) 08/05/2020   INSULIN 46.7 (H) 05/04/2020    INSULIN 29.0 (H) 01/07/2020   INSULIN 32.5 (H) 10/14/2019   INSULIN 19.6 07/17/2019   Lab Results  Component Value Date   TSH 3.800 05/04/2020   Lab Results  Component Value Date   CHOL 176 05/04/2020   HDL 61 05/04/2020   LDLCALC 93 05/04/2020   TRIG 123 05/04/2020   CHOLHDL 3.7 03/31/2019   Lab Results  Component Value Date   VD25OH 49.6 08/05/2020   VD25OH 37.7 05/04/2020   VD25OH 47.4 01/07/2020   Lab Results  Component Value Date   WBC 6.7 05/14/2020   HGB 12.3 05/14/2020   HCT 37.3 05/14/2020   MCV 94.2 05/14/2020   PLT 349 05/14/2020   No results found for: IRON, TIBC, FERRITIN  Attestation Statements:   Reviewed by clinician on day of visit: allergies, medications, problem list, medical history, surgical history, family history, social history, and previous encounter notes.  I, Jackson Latino, RMA, am acting as Energy manager for [place], FNP.   I have reviewed the above documentation for accuracy and completeness, and I agree with the above. -  Jesse Sans, FNP

## 2021-01-19 ENCOUNTER — Encounter (INDEPENDENT_AMBULATORY_CARE_PROVIDER_SITE_OTHER): Payer: Self-pay

## 2021-02-02 ENCOUNTER — Other Ambulatory Visit: Payer: Self-pay

## 2021-02-02 ENCOUNTER — Ambulatory Visit (INDEPENDENT_AMBULATORY_CARE_PROVIDER_SITE_OTHER): Payer: BC Managed Care – PPO | Admitting: Family Medicine

## 2021-02-02 ENCOUNTER — Encounter (INDEPENDENT_AMBULATORY_CARE_PROVIDER_SITE_OTHER): Payer: Self-pay | Admitting: Family Medicine

## 2021-02-02 VITALS — BP 107/71 | HR 81 | Temp 98.4°F | Ht 65.0 in | Wt 238.0 lb

## 2021-02-02 DIAGNOSIS — Z6841 Body Mass Index (BMI) 40.0 and over, adult: Secondary | ICD-10-CM | POA: Diagnosis not present

## 2021-02-02 DIAGNOSIS — E8881 Metabolic syndrome: Secondary | ICD-10-CM

## 2021-02-02 DIAGNOSIS — E559 Vitamin D deficiency, unspecified: Secondary | ICD-10-CM | POA: Diagnosis not present

## 2021-02-02 DIAGNOSIS — E88819 Insulin resistance, unspecified: Secondary | ICD-10-CM

## 2021-02-02 NOTE — Progress Notes (Signed)
Chief Complaint:   OBESITY Kathryn Munoz is here to discuss her progress with her obesity treatment plan along with follow-up of her obesity related diagnoses. Kathryn Munoz is on the Category 3 Plan and keeping a food journal and adhering to recommended goals of 1500-1600 calories and 90 grams of protein and states she is following her eating plan approximately 50% of the time. Kathryn Munoz states she is doing 0 minutes 0 times per week.  Today's visit was #: 26 Starting weight: 252 lbs Starting date: 07/17/2019 Today's weight: 238 lbs Today's date: 02/02/2021 Total lbs lost to date: 14 lbs Total lbs lost since last in-office visit: 3 lbs  Interim History: Kathryn Munoz is eating lunch and dinner on plan. She is cooking more. She notes when she eats out with her kids she is not tempted to eat anything.  Subjective:   1. Insulin resistance Kathryn Munoz is on Saxenda 2.4 mg daily. Her appetite is fairly well controlled.  Lab Results  Component Value Date   INSULIN 42.7 (H) 08/05/2020   INSULIN 46.7 (H) 05/04/2020   INSULIN 29.0 (H) 01/07/2020   INSULIN 32.5 (H) 10/14/2019   INSULIN 19.6 07/17/2019   Lab Results  Component Value Date   HGBA1C 5.3 08/05/2020    2. Vitamin D deficiency Kathryn Munoz's Vitamin D is nearly at goal 21. She is on OTC Vitamin D 2000 IU.  Lab Results  Component Value Date   VD25OH 49.6 08/05/2020   VD25OH 37.7 05/04/2020   VD25OH 47.4 01/07/2020    Assessment/Plan:   1. Insulin resistance Kathryn Munoz agrees to increase Saxenda to 3.0 mg dose from 2.4 mg. We will check labs today.   - Comprehensive metabolic panel - Hemoglobin A1c - Insulin, random  2. Vitamin D deficiency  We will check labs today. Kathryn Munoz agrees to continue OTC Vitamin D 2,000 IU daily and she will follow-up for routine testing of Vitamin D, at least 2-3 times per year to avoid over-replacement.  - VITAMIN D 25 Hydroxy (Vit-D Deficiency, Fractures)  3. Obesity with current BMI 39.61 Kathryn Munoz is  currently in the action stage of change. As such, her goal is to continue with weight loss efforts. She has agreed to keeping a food journal and adhering to recommended goals of 1500-1600 calories and 90 grams of  protein daily.  We discussed low calorie/high protein options for breakfast.  Exercise goals:  [place] will consider walking at work a few days per week.  Behavioral modification strategies: increasing lean protein intake, no skipping meals, and meal planning and cooking strategies.  Kathryn Munoz has agreed to follow-up with our clinic in 3-4 weeks.  Objective:   Blood pressure 107/71, pulse 81, temperature 98.4 F (36.9 C), height 5\' 5"  (1.651 m), weight 238 lb (108 kg), SpO2 97 %. Body mass index is 39.61 kg/m.  General: Cooperative, alert, well developed, in no acute distress. HEENT: Conjunctivae and lids unremarkable. Cardiovascular: Regular rhythm.  Lungs: Normal work of breathing. Neurologic: No focal deficits.   Lab Results  Component Value Date   CREATININE 0.91 08/05/2020   BUN 8 08/05/2020   NA 142 08/05/2020   K 4.6 08/05/2020   CL 105 08/05/2020   CO2 23 08/05/2020   Lab Results  Component Value Date   ALT 8 08/05/2020   AST 13 08/05/2020   ALKPHOS 60 08/05/2020   BILITOT 0.5 08/05/2020   Lab Results  Component Value Date   HGBA1C 5.3 08/05/2020   HGBA1C 5.0 05/04/2020   HGBA1C 5.2 01/07/2020  HGBA1C 5.2 10/14/2019   HGBA1C 5.5 07/17/2019   Lab Results  Component Value Date   INSULIN 42.7 (H) 08/05/2020   INSULIN 46.7 (H) 05/04/2020   INSULIN 29.0 (H) 01/07/2020   INSULIN 32.5 (H) 10/14/2019   INSULIN 19.6 07/17/2019   Lab Results  Component Value Date   TSH 3.800 05/04/2020   Lab Results  Component Value Date   CHOL 176 05/04/2020   HDL 61 05/04/2020   LDLCALC 93 05/04/2020   TRIG 123 05/04/2020   CHOLHDL 3.7 03/31/2019   Lab Results  Component Value Date   VD25OH 49.6 08/05/2020   VD25OH 37.7 05/04/2020   VD25OH 47.4 01/07/2020    Lab Results  Component Value Date   WBC 6.7 05/14/2020   HGB 12.3 05/14/2020   HCT 37.3 05/14/2020   MCV 94.2 05/14/2020   PLT 349 05/14/2020   No results found for: IRON, TIBC, FERRITIN  Attestation Statements:   Reviewed by clinician on day of visit: allergies, medications, problem list, medical history, surgical history, family history, social history, and previous encounter notes.  I, Kathryn Munoz, RMA, am acting as Energy manager for [place], FNP.   I have reviewed the above documentation for accuracy and completeness, and I agree with the above. -  Kathryn Sans, FNP

## 2021-02-03 ENCOUNTER — Encounter (INDEPENDENT_AMBULATORY_CARE_PROVIDER_SITE_OTHER): Payer: Self-pay | Admitting: Family Medicine

## 2021-02-03 LAB — COMPREHENSIVE METABOLIC PANEL
ALT: 15 IU/L (ref 0–32)
AST: 20 IU/L (ref 0–40)
Albumin/Globulin Ratio: 1.6 (ref 1.2–2.2)
Albumin: 4.5 g/dL (ref 3.8–4.8)
Alkaline Phosphatase: 63 IU/L (ref 44–121)
BUN/Creatinine Ratio: 10 (ref 9–23)
BUN: 10 mg/dL (ref 6–24)
Bilirubin Total: 0.5 mg/dL (ref 0.0–1.2)
CO2: 21 mmol/L (ref 20–29)
Calcium: 9.6 mg/dL (ref 8.7–10.2)
Chloride: 101 mmol/L (ref 96–106)
Creatinine, Ser: 0.97 mg/dL (ref 0.57–1.00)
Globulin, Total: 2.8 g/dL (ref 1.5–4.5)
Glucose: 116 mg/dL — ABNORMAL HIGH (ref 70–99)
Potassium: 4.8 mmol/L (ref 3.5–5.2)
Sodium: 138 mmol/L (ref 134–144)
Total Protein: 7.3 g/dL (ref 6.0–8.5)
eGFR: 74 mL/min/{1.73_m2} (ref >59)

## 2021-02-03 LAB — HEMOGLOBIN A1C
Est. average glucose Bld gHb Est-mCnc: 100 mg/dL
Hgb A1c MFr Bld: 5.1 % (ref 4.8–5.6)

## 2021-02-03 LAB — INSULIN, RANDOM: INSULIN: 83.7 u[IU]/mL — ABNORMAL HIGH (ref 2.6–24.9)

## 2021-02-03 LAB — VITAMIN D 25 HYDROXY (VIT D DEFICIENCY, FRACTURES): Vit D, 25-Hydroxy: 31.5 ng/mL (ref 30.0–100.0)

## 2021-02-09 ENCOUNTER — Encounter: Payer: Self-pay | Admitting: Psychiatry

## 2021-02-09 ENCOUNTER — Other Ambulatory Visit: Payer: Self-pay

## 2021-02-09 ENCOUNTER — Telehealth: Payer: BC Managed Care – PPO | Admitting: Psychiatry

## 2021-02-09 ENCOUNTER — Encounter (INDEPENDENT_AMBULATORY_CARE_PROVIDER_SITE_OTHER): Payer: Self-pay | Admitting: Family Medicine

## 2021-02-09 DIAGNOSIS — F429 Obsessive-compulsive disorder, unspecified: Secondary | ICD-10-CM | POA: Diagnosis not present

## 2021-02-09 DIAGNOSIS — F3176 Bipolar disorder, in full remission, most recent episode depressed: Secondary | ICD-10-CM

## 2021-02-09 DIAGNOSIS — F5105 Insomnia due to other mental disorder: Secondary | ICD-10-CM

## 2021-02-09 NOTE — Progress Notes (Signed)
Virtual Visit via Video Note  I connected with [name] on 02/09/21 at 10:40 AM EST by a video enabled telemedicine application and verified that I am speaking with the correct person using two identifiers.  Location Provider Location : [facility] Patient Location : Car  Participants: Patient , Provider   I discussed the limitations of evaluation and management by telemedicine and the availability of in person appointments. The patient expressed understanding and agreed to proceed.   I discussed the assessment and treatment plan with the patient. The patient was provided an opportunity to ask questions and all were answered. The patient agreed with the plan and demonstrated an understanding of the instructions.   The patient was advised to call back or seek an in-person evaluation if the symptoms worsen or if the condition fails to improve as anticipated.   [place] MD Progress Note  02/09/2021 10:53 AM DENESHA BROUSE  MRN:  [id number]  Chief Complaint:  Chief Complaint   Follow-up; Depression; Anxiety    HPI: ALICYN KLANN is a 45 year old African-American female, lives in [place], has a history of bipolar disorder, insomnia, OCD, insulin resistance, hypothyroidism, other specified eating disorder was evaluated by telemedicine today.  Patient today reports she is currently having mild mood swings, sleep problems since the past couple of days.  She reports it is likely because the holidays are coming in and she continues to miss her mom who passed away.  This will be the second Thanksgiving holiday without her mom.  She continues to struggle with that thought.  Patient reports she however has not been able to take the hydroxyzine yet and would like to give it a try this weekend if possible.  She is not interested in making further medication changes.  She follows up with therapist on a regular basis and reports therapy sessions are beneficial.  Patient denies any suicidality,  homicidality or perceptual disturbances.  Patient denies any other concerns today.  Visit Diagnosis:    ICD-10-CM   1. Bipolar disorder, in full remission, most recent episode depressed ([place])  F31.76     2. Insomnia due to mental condition  F51.05    mood    3. 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  F42.9       Past Psychiatric History: Reviewed past psychiatric history from progress note on 11/08/2017.  Past trials of Celexa.  Patient with history of suicide attempt at the age of 45, she overdosed on pills, was admitted to the hospital.  Reports another episode at the age of 45, slept through it and did not tell anyone.  Past Medical History:  Past Medical History:  Diagnosis Date   Abnormal mammogram    breast biopsy, [facility] 2014   ADD (attention deficit disorder)    Anxiety    Bipolar 2 disorder ([place])    Constipation    Depression    Dermatitis    Heavy menstrual bleeding    History of gestational diabetes    History of kidney stones 2014   Hypothyroidism    IFG (impaired fasting glucose)    Insulin resistance    Irregular periods    Joint pain    Multiple food allergies    Dates and nuts   Neuropathy    Obesity    OCD (obsessive compulsive disorder)    Prediabetes    Vitamin D deficiency disease     Past Surgical History:  Procedure Laterality Date   BREAST BIOPSY Left 2014  [place] bx/clip-neg   INTRAUTERINE DEVICE INSERTION  03/11/2012    Family Psychiatric History: Reviewed family psychiatric history from progress note on 11/08/2017  Family History:  Family History  Problem Relation Age of Onset   Diabetes Mother    Pancreatitis Mother    Hypertension Mother    Alcohol abuse Mother    Schizophrenia Mother    Depression Mother    Heart disease Mother    Stroke Mother    Thyroid disease Mother    Kidney disease Mother    Anxiety disorder Mother    Alcohol abuse Father    Diabetes Sister        pre-diabetic   Diabetes Brother     Drug abuse Brother    ADD / ADHD Son    Eczema Son    Breast cancer Cousin        pat cousin   Anxiety disorder Other    Ovarian cancer Neg Hx     Social History: Reviewed social history from progress note on 11/08/2017 Social History   Socioeconomic History   Marital status: Married    Spouse name: Elsy Chiang   Number of children: 2   Years of education: Not on file   Highest education level: Bachelor's degree (e.g., BA, AB, BS)  Occupational History   Occupation: English as a second language teacher  Tobacco Use   Smoking status: Never   Smokeless tobacco: Never  Vaping Use   Vaping Use: Never used  Substance and Sexual Activity   Alcohol use: No   Drug use: No   Sexual activity: Yes    Partners: Male    Birth control/protection: Other-see comments    Comment: nuvaring  Other Topics Concern   Not on file  Social History Narrative   Not on file   Social Determinants of Health   Financial Resource Strain: Low Risk    Difficulty of Paying Living Expenses: Not hard at all  Food Insecurity: No Food Insecurity   Worried About Charity fundraiser in the Last Year: Never true   [place] in the Last Year: Never true  Transportation Needs: No Transportation Needs   Lack of Transportation (Medical): No   Lack of Transportation (Non-Medical): No  Physical Activity: Insufficiently Active   Days of Exercise per Week: 7 days   Minutes of Exercise per Session: 10 min  Stress: No Stress Concern Present   Feeling of Stress : Not at all  Social Connections: Moderately Integrated   Frequency of Communication with Friends and Family: Three times a week   Frequency of Social Gatherings with Friends and Family: Three times a week   Attends Religious Services: 1 to 4 times per year   Active Member of Clubs or Organizations: No   Attends Archivist Meetings: Never   Marital Status: Married    Allergies:  Allergies  Allergen Reactions   Date Seed Extract  [Zizyphus Jujuba] Anaphylaxis,  Cough, Itching and Swelling   Other Swelling    Metabolic Disorder Labs: Lab Results  Component Value Date   HGBA1C 5.1 02/02/2021   MPG 117 03/31/2019   MPG 91 05/09/2018   No results found for: PROLACTIN Lab Results  Component Value Date   CHOL 176 05/04/2020   TRIG 123 05/04/2020   HDL 61 05/04/2020   CHOLHDL 3.7 03/31/2019   VLDL 14 10/16/2016   LDLCALC 93 05/04/2020   LDLCALC 113 (H) 10/14/2019   Lab Results  Component Value Date   TSH 3.800  05/04/2020   TSH 2.210 07/17/2019    Therapeutic Level Labs: No results found for: LITHIUM No results found for: VALPROATE No components found for:  CBMZ  Current Medications: Current Outpatient Medications  Medication Sig Dispense Refill   Cholecalciferol (VITAMIN D) 50 MCG (2000 UT) tablet Take 2,000 Units by mouth daily.     citalopram (CELEXA) 20 MG tablet Take 1 tablet (20 mg total) by mouth daily. 90 tablet 1   EPINEPHrine 0.3 mg/0.3 mL IJ SOAJ injection INJECT 0.3 MLS INTO THE MUSCLE ONCE FOR 1 DOSE. FOR LIFE-THREATENING ALLERGIC REACTION / ANAPHYLAXIS  1   etonogestrel-ethinyl estradiol (NUVARING) 0.12-0.015 MG/24HR vaginal ring Place 1 each vaginally every 28 (twenty-eight) days. Insert vaginally and leave in place for 3 consecutive weeks, then remove for 1 week. 3 each 3   FLOWFLEX COVID-19 AG HOME TEST KIT FOLLOW INSTRUCTIONS INCLUDED WITH THE PACKAGE.     hydrOXYzine (VISTARIL) 25 MG capsule Take 1-2 capsules (25-50 mg total) by mouth at bedtime as needed. For sleep 60 capsule 1   Insulin Pen Needle 32G X 6 MM MISC Will use with Saxenda daily 100 each 0   lamoTRIgine (LAMICTAL) 150 MG tablet Take 1 tablet (150 mg total) by mouth daily. Take along with 25 mg daily 90 tablet 1   lamoTRIgine (LAMICTAL) 25 MG tablet Take 1 tablet (25 mg total) by mouth daily. Take along with 150 mg daily 90 tablet 0   levothyroxine (SYNTHROID) 75 MCG tablet TAKE 1 TABLET BY MOUTH EVERY DAY BEFORE BREAKFAST 90 tablet 0   linaclotide  (LINZESS) 145 MCG CAPS capsule TAKE 1 CAPSULE BY MOUTH EVERY DAY BEFORE BREAKFAST 30 capsule 0   Liraglutide -Weight Management (SAXENDA) 18 MG/3ML SOPN Inject 3 mg into the skin daily. 15 mL 0   metFORMIN (GLUCOPHAGE-XR) 500 MG 24 hr tablet TAKE 3 TABLETS (1,500 MG TOTAL) BY MOUTH DAILY. 270 tablet 3   polyethylene glycol (MIRALAX / GLYCOLAX) packet Take 17 g by mouth as needed.      traZODone (DESYREL) 50 MG tablet Take 1-2 tablets (50-100 mg total) by mouth at bedtime as needed for sleep. 180 tablet 0   No current facility-administered medications for this visit.     Musculoskeletal: Strength & Muscle Tone:  UTA Gait & Station:  Seated Patient leans: Backward  Psychiatric Specialty Exam: Review of Systems  Psychiatric/Behavioral:  Positive for sleep disturbance.        Mood swings   All other systems reviewed and are negative.  There were no vitals taken for this visit.There is no height or weight on file to calculate BMI.  General Appearance: Casual  Eye Contact:  Fair  Speech:  Clear and Coherent  Volume:  Normal  Mood:   mood swings   Affect:  Congruent  Thought Process:  Goal Directed and Descriptions of Associations: Intact  Orientation:  Full (Time, Place, and Person)  Thought Content: Logical   Suicidal Thoughts:  No  Homicidal Thoughts:  No  Memory:  Immediate;   Fair Recent;   Fair Remote;   Fair  Judgement:  Fair  Insight:  Fair  Psychomotor Activity:  Normal  Concentration:  Concentration: Fair and Attention Span: Fair  Recall:  [employer] of Knowledge: Fair  Language: Fair  Akathisia:  No  Handed:  Right  AIMS (if indicated): not done  Assets:  Communication Skills Desire for Improvement Housing Social Support  ADL's:  Intact  Cognition: WNL  Sleep:   Restless   Screenings:  AIMS    Flowsheet Row Video Visit from 10/28/2020 in [facility] Total Score 0      GAD-7    Flowsheet Row Video Visit from 10/28/2020  in [facility] Office Visit from 05/14/2020 in [facility]  Total GAD-7 Score 0 4      PHQ2-9    Flowsheet Row Counselor from 12/31/2020 in [place] Office Visit from 11/17/2020 in [facility] Video Visit from 10/28/2020 in [place] Video Visit from 10/14/2020 in [place] Counselor from 10/04/2020 in [place]  PHQ-2 Total Score 0 0 1 6 1   PHQ-9 Total Score -- 0 3 18 --      Flowsheet Row Counselor from 12/31/2020 in [place] Counselor from 10/04/2020 in [place] Office Visit from 07/27/2020 in [place] Low Risk Low Risk Low Risk        Assessment and Plan: JANNESSA OGDEN is a 45 year old African-American female, married, employed, lives in [place], has a history of bipolar disorder, sleep problems, OCD, hypothyroidism was evaluated by telemedicine today.  Patient is currently grieving her mom since it is the holiday season.  Patient will continue to benefit from medication management and psychotherapy sessions.  Plan as noted below.  Plan Bipolar disorder in remission Lamotrigine 175 mg p.o. daily Celexa 20 mg p.o. daily-reduced dose due to sexual dysfunction  OCD-stable Continue CBT with Ms. Christina Hussami Celexa 20 mg p.o. daily  Insomnia-unstable Patient has not yet tried the hydroxyzine as discussed last visit.  She will give it a try this weekend. Continue hydroxyzine 25-50 mg p.o. nightly as needed. Trazodone 50 mg at bedtime.  Follow-up in clinic in 1 month or sooner if needed.  This note was generated in part or whole with voice recognition software. Voice recognition is usually quite accurate but there are transcription errors that can and very often do occur. I  apologize for any typographical errors that were not detected and corrected.       Ursula Alert, MD 02/09/2021, 10:53 AM

## 2021-02-11 ENCOUNTER — Ambulatory Visit: Payer: BC Managed Care – PPO | Admitting: Licensed Clinical Social Worker

## 2021-02-14 ENCOUNTER — Ambulatory Visit
Admission: RE | Admit: 2021-02-14 | Discharge: 2021-02-14 | Disposition: A | Discharge status: Home / Self Care | Payer: BC Managed Care – PPO | Source: Ambulatory Visit | Attending: Nurse Practitioner | Admitting: Nurse Practitioner

## 2021-02-14 ENCOUNTER — Other Ambulatory Visit: Payer: Self-pay

## 2021-02-14 DIAGNOSIS — Z1231 Encounter for screening mammogram for malignant neoplasm of breast: Secondary | ICD-10-CM | POA: Diagnosis present

## 2021-02-19 ENCOUNTER — Other Ambulatory Visit (INDEPENDENT_AMBULATORY_CARE_PROVIDER_SITE_OTHER): Payer: Self-pay | Admitting: Adult Health

## 2021-02-19 DIAGNOSIS — E038 Other specified hypothyroidism: Secondary | ICD-10-CM

## 2021-02-21 NOTE — Telephone Encounter (Signed)
LAST APPOINTMENT DATE: 02/02/21 NEXT APPOINTMENT DATE: 03/01/21   [id number] [place], [place] - [address] [place] [address] Phone: [phone] Fax: [phone]  [facility] [address] [place] - [address] [place] [address] Phone: [phone] Fax: [phone]  Patient is requesting a refill of the following medications: Requested Prescriptions   Pending Prescriptions Disp Refills   levothyroxine (SYNTHROID) 75 MCG tablet [Pharmacy Med Name: LEVOTHYROXINE 75 MCG TABLET] 90 tablet 0    Sig: TAKE 1 TABLET BY MOUTH EVERY DAY BEFORE BREAKFAST    Date last filled: 11/30/20 Previously prescribed by Dr. Manson Passey  Lab Results  Component Value Date   HGBA1C 5.1 02/02/2021   HGBA1C 5.3 08/05/2020   HGBA1C 5.0 05/04/2020   Lab Results  Component Value Date   LDLCALC 93 05/04/2020   CREATININE 0.97 02/02/2021   Lab Results  Component Value Date   VD25OH 31.5 02/02/2021   VD25OH 49.6 08/05/2020   VD25OH 37.7 05/04/2020    BP Readings from Last 3 Encounters:  02/02/21 107/71  01/18/21 111/64  12/23/20 119/76

## 2021-02-28 ENCOUNTER — Ambulatory Visit (INDEPENDENT_AMBULATORY_CARE_PROVIDER_SITE_OTHER): Payer: BC Managed Care – PPO | Admitting: Licensed Clinical Social Worker

## 2021-02-28 ENCOUNTER — Other Ambulatory Visit: Payer: Self-pay

## 2021-02-28 DIAGNOSIS — F3176 Bipolar disorder, in full remission, most recent episode depressed: Secondary | ICD-10-CM | POA: Diagnosis not present

## 2021-02-28 NOTE — Plan of Care (Signed)
  Problem: Bipolar Disorder CCP Problem  1 Alleviate depressive/manic symptoms and return to improved levels of effective functioning. Goal: LTG: Stabilize mood and increase goal-directed behavior: Input needed on appropriate metric Outcome: Progressing Goal: STG: [name] will attend at least 80% of scheduled follow-up medication management appointments Outcome: Progressing

## 2021-02-28 NOTE — Progress Notes (Signed)
Virtual Visit via Audio Note  I connected with FAELYNN WYNDER on 02/28/21 at  2:00 PM EST by an audio enabled telemedicine application and verified that I am speaking with the correct person using two identifiers.  Video connection was lost when less than 50% of the duration of the visit was complete, at which time the remainder of the visit was completed via audio only. (Could not get pt video to work)  Location: Barrister's clerk: remote office [place], [place])   I discussed the limitations of evaluation and management by telemedicine and the availability of in person appointments. The patient expressed understanding and agreed to proceed.  I discussed the assessment and treatment plan with the patient. The patient was provided an opportunity to ask questions and all were answered. The patient agreed with the plan and demonstrated an understanding of the instructions.   The patient was advised to call back or seek an in-person evaluation if the symptoms worsen or if the condition fails to improve as anticipated.  I provided 45 minutes of non-face-to-face time during this encounter.   Nahima Ales R Dontavia Brand, LCSW   THERAPIST PROGRESS NOTE  Session Time: 2-245p  Participation Level: Active  Behavioral Response: Neat and Well GroomedAlertDepressed  Type of Therapy: Individual Therapy  Treatment Goals addressed:  Goal: LTG: Stabilize mood and increase goal-directed behavior: Input needed on appropriate metric Outcome: Progressing  Goal: STG: [name] will attend at least 80% of scheduled follow-up medication management appointments Outcome: Progressing Interventions:  Intervention: Work with patient to identify the major components of a recent episode of anxiety: physical symptoms, major thoughts and images, and major behaviors they experienced  Intervention: Discuss self-management skills  Intervention: Assess emotional status and coping  mechanisms  Summary: AMANDAMARIE FEGGINS is a 45 y.o. female who presents with improving symptoms related to bipolar disorder diagnosis. Pt reports that overall mood is stable and that she feels she is managing situational stressors well.  Pt reports that she is compliant with medication and is getting good quality and quantity of sleep.   Allowed pt to explore and express thoughts and feelings associated with recent life situations and external stressors Pt husband is continuing with law enforcement training and is taking licensing exam this coming Friday.Explored relationship with husband and discussed recent conflict situation--reviewed conflict resolution skills/strategies. Pt reports that she has good conflict resolution skills but felt that husband did not respect when she stated the "safe word" meaning the situation should have stopped in that moment. Pt reports that she will communicate with him soon about this. Explored several ways that pt is trying to incorporate her mother's memory into her holiday celebrations. Pt made a custom floral arrangement for her mother's grave. Encouraged pt to allow herself to feel all of her feelings fully--sadness, anger, happiness.   Continued recommendations are as follows: self care behaviors, positive social engagements, focusing on overall work/home/life balance, and focusing on positive physical and emotional wellness.   Suicidal/Homicidal: No  Therapist Response: Pt is continuing to apply interventions learned in session into daily life situations. Pt is currently on track to meet goals utilizing interventions mentioned above. Personal growth and progress noted. Treatment to continue as indicated.   Plan: Return again in 4 weeks.  Diagnosis: Axis I: Bipolar disorder, depressed, remission    Axis II: No diagnosis    Wednesday Iviona Hole, LCSW 02/28/2021

## 2021-03-01 ENCOUNTER — Encounter (INDEPENDENT_AMBULATORY_CARE_PROVIDER_SITE_OTHER): Payer: Self-pay | Admitting: Family Medicine

## 2021-03-01 ENCOUNTER — Other Ambulatory Visit: Payer: Self-pay

## 2021-03-01 ENCOUNTER — Ambulatory Visit (INDEPENDENT_AMBULATORY_CARE_PROVIDER_SITE_OTHER): Payer: BC Managed Care – PPO | Admitting: Family Medicine

## 2021-03-01 VITALS — BP 127/79 | HR 82 | Temp 98.4°F | Ht 65.0 in | Wt 237.0 lb

## 2021-03-01 DIAGNOSIS — E559 Vitamin D deficiency, unspecified: Secondary | ICD-10-CM | POA: Diagnosis not present

## 2021-03-01 DIAGNOSIS — K5903 Drug induced constipation: Secondary | ICD-10-CM

## 2021-03-01 DIAGNOSIS — E8881 Metabolic syndrome: Secondary | ICD-10-CM | POA: Diagnosis not present

## 2021-03-01 DIAGNOSIS — Z6841 Body Mass Index (BMI) 40.0 and over, adult: Secondary | ICD-10-CM

## 2021-03-01 MED ORDER — VITAMIN D (ERGOCALCIFEROL) 1.25 MG (50000 UNIT) PO CAPS: 50000.0000 [IU] | ORAL_CAPSULE | ORAL | 0 refills | Status: DC

## 2021-03-01 MED ORDER — LINACLOTIDE 145 MCG PO CAPS: ORAL_CAPSULE | ORAL | 0 refills | Status: DC

## 2021-03-02 ENCOUNTER — Encounter (INDEPENDENT_AMBULATORY_CARE_PROVIDER_SITE_OTHER): Payer: Self-pay | Admitting: Family Medicine

## 2021-03-02 NOTE — Progress Notes (Signed)
Chief Complaint:   OBESITY Kathryn Munoz is here to discuss her progress with her obesity treatment plan along with follow-up of her obesity related diagnoses. Kathryn Munoz is on the Category 3 Plan and keeping a food journal and adhering to recommended goals of 1500-1600 calories and 90 grams of protein and states she is following her eating plan approximately 15% of the time. Kathryn Munoz states she is doing 0 minutes 0 times per week.  Today's visit was #: 27 Starting weight: 252 lbs Starting date: 07/16/2020 Today's weight: 237 lbs Today's date: 03/01/2021 Total lbs lost to date: 15 lbs Total lbs lost since last in-office visit: 1 lb  Interim History: Kathryn Munoz feels recently she has been off plan due to lack of time to shop for groceries and abundance of sweets at home and work. She has 2 teenage sons who like to snack. She feels she is getting in adequate protein. She has a protein shake most days. She did exercise one time on her elliptical over past few weeks.  Subjective:   1. Insulin resistance Kathryn Munoz feels Kathryn Munoz is helping with appetite. She is on Saxenda 3.0 mg daily. Her fasting insulin and glucose elevated but A1C is 5.1.   Lab Results  Component Value Date   INSULIN 83.7 (H) 02/02/2021   INSULIN 42.7 (H) 08/05/2020   INSULIN 46.7 (H) 05/04/2020   INSULIN 29.0 (H) 01/07/2020   INSULIN 32.5 (H) 10/14/2019   Lab Results  Component Value Date   HGBA1C 5.1 02/02/2021    2. Drug-induced constipation Kathryn Munoz's constipation is well controlled with Linzess.  3. Vitamin D deficiency Kathryn Munoz's Vitamin D is low at 31.5. She has decreased on OTC Vitamin D.   Lab Results  Component Value Date   VD25OH 31.5 02/02/2021   VD25OH 49.6 08/05/2020   VD25OH 37.7 05/04/2020    Assessment/Plan:   1. Insulin resistance Kathryn Munoz will continue Saxenda 3.0 mg daily.   2. Drug-induced constipation  We will refill Linzess 154 mcg every day.   - linaclotide (LINZESS) 145 MCG CAPS  capsule; TAKE 1 CAPSULE BY MOUTH EVERY DAY BEFORE BREAKFAST  Dispense: 30 capsule; Refill: 0  3. Vitamin D deficiency  Kathryn Munoz agrees to start prescription Vitamin D 50,000 IU every week for 1 month with no refills and she will follow-up for routine testing of Vitamin D, at least 2-3 times per year to avoid over-replacement.  - Vitamin D, Ergocalciferol, (DRISDOL) 1.25 MG (50000 UNIT) CAPS capsule; Take 1 capsule (50,000 Units total) by mouth every 7 (seven) days.  Dispense: 4 capsule; Refill: 0  4. Obesity with current BMI 39.44 Kathryn Munoz is currently in the action stage of change. As such, her goal is to continue with weight loss efforts. She has agreed to the Category 3 Plan.   Exercise goals: All adults should avoid inactivity. Some physical activity is better than none, and adults who participate in any amount of physical activity gain some health benefits.  Behavioral modification strategies: increasing lean protein intake and decreasing simple carbohydrates.  Kathryn Munoz has agreed to follow-up with our clinic in 3 weeks.  Objective:   Blood pressure 127/79, pulse 82, temperature 98.4 F (36.9 C), height 5\' 5"  (1.651 m), weight 237 lb (107.5 kg), SpO2 97 %. Body mass index is 39.44 kg/m.  General: Cooperative, alert, well developed, in no acute distress. HEENT: Conjunctivae and lids unremarkable. Cardiovascular: Regular rhythm.  Lungs: Normal work of breathing. Neurologic: No focal deficits.   Lab Results  Component Value Date  CREATININE 0.97 02/02/2021   BUN 10 02/02/2021   NA 138 02/02/2021   K 4.8 02/02/2021   CL 101 02/02/2021   CO2 21 02/02/2021   Lab Results  Component Value Date   ALT 15 02/02/2021   AST 20 02/02/2021   ALKPHOS 63 02/02/2021   BILITOT 0.5 02/02/2021   Lab Results  Component Value Date   HGBA1C 5.1 02/02/2021   HGBA1C 5.3 08/05/2020   HGBA1C 5.0 05/04/2020   HGBA1C 5.2 01/07/2020   HGBA1C 5.2 10/14/2019   Lab Results  Component Value Date    INSULIN 83.7 (H) 02/02/2021   INSULIN 42.7 (H) 08/05/2020   INSULIN 46.7 (H) 05/04/2020   INSULIN 29.0 (H) 01/07/2020   INSULIN 32.5 (H) 10/14/2019   Lab Results  Component Value Date   TSH 3.800 05/04/2020   Lab Results  Component Value Date   CHOL 176 05/04/2020   HDL 61 05/04/2020   LDLCALC 93 05/04/2020   TRIG 123 05/04/2020   CHOLHDL 3.7 03/31/2019   Lab Results  Component Value Date   VD25OH 31.5 02/02/2021   VD25OH 49.6 08/05/2020   VD25OH 37.7 05/04/2020   Lab Results  Component Value Date   WBC 6.7 05/14/2020   HGB 12.3 05/14/2020   HCT 37.3 05/14/2020   MCV 94.2 05/14/2020   PLT 349 05/14/2020   No results found for: IRON, TIBC, FERRITIN  Attestation Statements:   Reviewed by clinician on day of visit: allergies, medications, problem list, medical history, surgical history, family history, social history, and previous encounter notes.  I, Jackson Latino, RMA, am acting as Energy manager for [place], FNP.   I have reviewed the above documentation for accuracy and completeness, and I agree with the above. -  Jesse Sans, FNP

## 2021-03-18 ENCOUNTER — Encounter: Payer: Self-pay | Admitting: Psychiatry

## 2021-03-18 ENCOUNTER — Telehealth (INDEPENDENT_AMBULATORY_CARE_PROVIDER_SITE_OTHER): Payer: BC Managed Care – PPO | Admitting: Psychiatry

## 2021-03-18 ENCOUNTER — Other Ambulatory Visit: Payer: Self-pay

## 2021-03-18 DIAGNOSIS — F3176 Bipolar disorder, in full remission, most recent episode depressed: Secondary | ICD-10-CM | POA: Diagnosis not present

## 2021-03-18 DIAGNOSIS — F5105 Insomnia due to other mental disorder: Secondary | ICD-10-CM | POA: Diagnosis not present

## 2021-03-18 DIAGNOSIS — F429 Obsessive-compulsive disorder, unspecified: Secondary | ICD-10-CM

## 2021-03-18 MED ORDER — TRAZODONE HCL 50 MG PO TABS: 50.0000 mg | ORAL_TABLET | Freq: Every evening | ORAL | 0 refills | Status: DC | PRN

## 2021-03-18 MED ORDER — LAMOTRIGINE 150 MG PO TABS: 150.0000 mg | ORAL_TABLET | Freq: Every day | ORAL | 1 refills | Status: DC

## 2021-03-18 MED ORDER — LAMOTRIGINE 25 MG PO TABS
25.0000 mg | ORAL_TABLET | Freq: Every day | ORAL | 0 refills | Status: DC
Start: 2021-03-18 — End: 2021-05-04

## 2021-03-18 NOTE — Progress Notes (Signed)
Virtual Visit via Video Note  I connected with Kathryn Munoz on 03/18/21 at 10:40 AM EST by a video enabled telemedicine application and verified that I am speaking with the correct person using two identifiers.  Location Provider Location : [facility] Patient Location : work  Participants: Patient , Provider    I discussed the limitations of evaluation and management by telemedicine and the availability of in person appointments. The patient expressed understanding and agreed to proceed.    I discussed the assessment and treatment plan with the patient. The patient was provided an opportunity to ask questions and all were answered. The patient agreed with the plan and demonstrated an understanding of the instructions.   The patient was advised to call back or seek an in-person evaluation if the symptoms worsen or if the condition fails to improve as anticipated.   [place] MD OP Progress Note  03/18/2021 10:56 AM CEILI BOSHERS  MRN:  [id number]  Chief Complaint:  Chief Complaint   Follow-up; Depression; Insomnia    HPI: Kathryn Munoz is a 45 year old African-American female, lives in [place], has a history of bipolar disorder, insomnia, insulin resistance, hypothyroidism, other specified eating disorder was evaluated by telemedicine today.  Patient today reports she is currently improving with regards to her sleep.  She goes to bed at around 10 PM and wakes up at around 7 AM.  She reports although she is getting enough sleep she has had a few episodes of nightmares in the past few weeks.  She reports she does have stressors of her husband starting his job as a Engineer, structural.  Also has stress of the holiday season, missing her mother who passed away.  She reports in spite of having nightmares she has been waking up feeling rested.  She is not interested in medication changes.  Reports the hydroxyzine does help.  She continues to take trazodone.  Agrees to talk to her therapist, has upcoming  therapy visit.  Patient denies any significant depression or anxiety symptoms.  Denies any suicidality, homicidality or perceptual disturbances.  Patient denies any other concerns today.    Visit Diagnosis:    ICD-10-CM   1. Bipolar disorder, in full remission, most recent episode depressed (HCC)  F31.76 lamoTRIgine (LAMICTAL) 25 MG tablet   type 2    2. Insomnia due to mental condition  F51.05 traZODone (DESYREL) 50 MG tablet   mood    3. 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  F42.9 lamoTRIgine (LAMICTAL) 150 MG tablet      Past Psychiatric History: Reviewed past psychiatric history from progress note on 11/08/2017.  Past trials of Celexa.  Patient with history of suicide attempt at the age of 59, overdosed on pills, was admitted to the hospital.  Another episode at the age of 34, slept through it and did not tell anyone.  Past Medical History:  Past Medical History:  Diagnosis Date   Abnormal mammogram    breast biopsy, [facility] 2014   ADD (attention deficit disorder)    Anxiety    Bipolar 2 disorder ([place])    Constipation    Depression    Dermatitis    Heavy menstrual bleeding    History of gestational diabetes    History of kidney stones 2014   Hypothyroidism    IFG (impaired fasting glucose)    Insulin resistance    Irregular periods    Joint pain    Multiple food allergies    Dates and nuts  Neuropathy    Obesity    OCD (obsessive compulsive disorder)    Prediabetes    Vitamin D deficiency disease     Past Surgical History:  Procedure Laterality Date   BREAST BIOPSY Left 2014   [place] bx/clip-neg   INTRAUTERINE DEVICE INSERTION  03/11/2012    Family Psychiatric History: Reviewed family psychiatric history from progress note on 11/08/2017.  Family History:  Family History  Problem Relation Age of Onset   Diabetes Mother    Pancreatitis Mother    Hypertension Mother    Alcohol abuse Mother    Schizophrenia Mother    Depression Mother     Heart disease Mother    Stroke Mother    Thyroid disease Mother    Kidney disease Mother    Anxiety disorder Mother    Alcohol abuse Father    Diabetes Sister        pre-diabetic   Diabetes Brother    Drug abuse Brother    ADD / ADHD Son    Eczema Son    Breast cancer Cousin        pat cousin   Anxiety disorder Other    Ovarian cancer Neg Hx     Social History: Reviewed social history from progress note on 11/08/2017. Social History   Socioeconomic History   Marital status: Married    Spouse name: Nakeeta Sebastiani   Number of children: 2   Years of education: Not on file   Highest education level: Bachelor's degree (e.g., BA, AB, BS)  Occupational History   Occupation: English as a second language teacher  Tobacco Use   Smoking status: Never   Smokeless tobacco: Never  Vaping Use   Vaping Use: Never used  Substance and Sexual Activity   Alcohol use: No   Drug use: No   Sexual activity: Yes    Partners: Male    Birth control/protection: Other-see comments    Comment: nuvaring  Other Topics Concern   Not on file  Social History Narrative   Not on file   Social Determinants of Health   Financial Resource Strain: Low Risk    Difficulty of Paying Living Expenses: Not hard at all  Food Insecurity: No Food Insecurity   Worried About Charity fundraiser in the Last Year: Never true   [place] in the Last Year: Never true  Transportation Needs: No Transportation Needs   Lack of Transportation (Medical): No   Lack of Transportation (Non-Medical): No  Physical Activity: Insufficiently Active   Days of Exercise per Week: 7 days   Minutes of Exercise per Session: 10 min  Stress: No Stress Concern Present   Feeling of Stress : Not at all  Social Connections: Moderately Integrated   Frequency of Communication with Friends and Family: Three times a week   Frequency of Social Gatherings with Friends and Family: Three times a week   Attends Religious Services: 1 to 4 times per year   Active  Member of Clubs or Organizations: No   Attends Archivist Meetings: Never   Marital Status: Married    Allergies:  Allergies  Allergen Reactions   Date Seed Extract  [Zizyphus Jujuba] Anaphylaxis, Cough, Itching and Swelling   Other Swelling    Metabolic Disorder Labs: Lab Results  Component Value Date   HGBA1C 5.1 02/02/2021   MPG 117 03/31/2019   MPG 91 05/09/2018   No results found for: PROLACTIN Lab Results  Component Value Date   CHOL 176 05/04/2020  TRIG 123 05/04/2020   HDL 61 05/04/2020   CHOLHDL 3.7 03/31/2019   VLDL 14 10/16/2016   LDLCALC 93 05/04/2020   LDLCALC 113 (H) 10/14/2019   Lab Results  Component Value Date   TSH 3.800 05/04/2020   TSH 2.210 07/17/2019    Therapeutic Level Labs: No results found for: LITHIUM No results found for: VALPROATE No components found for:  CBMZ  Current Medications: Current Outpatient Medications  Medication Sig Dispense Refill   Cholecalciferol (VITAMIN D) 50 MCG (2000 UT) tablet Take 2,000 Units by mouth daily.     citalopram (CELEXA) 20 MG tablet Take 1 tablet (20 mg total) by mouth daily. 90 tablet 1   EPINEPHrine 0.3 mg/0.3 mL IJ SOAJ injection INJECT 0.3 MLS INTO THE MUSCLE ONCE FOR 1 DOSE. FOR LIFE-THREATENING ALLERGIC REACTION / ANAPHYLAXIS  1   etonogestrel-ethinyl estradiol (NUVARING) 0.12-0.015 MG/24HR vaginal ring Place 1 each vaginally every 28 (twenty-eight) days. Insert vaginally and leave in place for 3 consecutive weeks, then remove for 1 week. 3 each 3   FLOWFLEX COVID-19 AG HOME TEST KIT FOLLOW INSTRUCTIONS INCLUDED WITH THE PACKAGE.     hydrOXYzine (VISTARIL) 25 MG capsule Take 1-2 capsules (25-50 mg total) by mouth at bedtime as needed. For sleep 60 capsule 1   Insulin Pen Needle 32G X 6 MM MISC Will use with Saxenda daily 100 each 0   lamoTRIgine (LAMICTAL) 150 MG tablet Take 1 tablet (150 mg total) by mouth daily. Take along with 25 mg daily 90 tablet 1   lamoTRIgine (LAMICTAL) 25 MG  tablet Take 1 tablet (25 mg total) by mouth daily. Take along with 150 mg daily 90 tablet 0   levothyroxine (SYNTHROID) 75 MCG tablet TAKE 1 TABLET BY MOUTH EVERY DAY BEFORE BREAKFAST 90 tablet 0   linaclotide (LINZESS) 145 MCG CAPS capsule TAKE 1 CAPSULE BY MOUTH EVERY DAY BEFORE BREAKFAST 30 capsule 0   Liraglutide -Weight Management (SAXENDA) 18 MG/3ML SOPN Inject 3 mg into the skin daily. 15 mL 0   metFORMIN (GLUCOPHAGE-XR) 500 MG 24 hr tablet TAKE 3 TABLETS (1,500 MG TOTAL) BY MOUTH DAILY. 270 tablet 3   polyethylene glycol (MIRALAX / GLYCOLAX) packet Take 17 g by mouth as needed.      traZODone (DESYREL) 50 MG tablet Take 1-2 tablets (50-100 mg total) by mouth at bedtime as needed for sleep. 180 tablet 0   Vitamin D, Ergocalciferol, (DRISDOL) 1.25 MG (50000 UNIT) CAPS capsule Take 1 capsule (50,000 Units total) by mouth every 7 (seven) days. 4 capsule 0   No current facility-administered medications for this visit.     Musculoskeletal: Strength & Muscle Tone:  UTA Gait & Station:  Seated Patient leans: N/A  Psychiatric Specialty Exam: Review of Systems  Psychiatric/Behavioral:         Grieving  All other systems reviewed and are negative.  There were no vitals taken for this visit.There is no height or weight on file to calculate BMI.  General Appearance: Casual  Eye Contact:  Fair  Speech:  Normal Rate  Volume:  Normal  Mood:   grieving  improving  Affect:  Congruent  Thought Process:  Goal Directed and Descriptions of Associations: Intact  Orientation:  Full (Time, Place, and Person)  Thought Content: Logical   Suicidal Thoughts:  No  Homicidal Thoughts:  No  Memory:  Immediate;   Fair Recent;   Fair Remote;   Fair  Judgement:  Fair  Insight:  Good  Psychomotor Activity:  Normal  Concentration:  Concentration: Fair and Attention Span: Fair  Recall:  [employer] of Knowledge: Fair  Language: Fair  Akathisia:  No  Handed:  Right  AIMS (if indicated): not done   Assets:  Communication Skills Desire for Improvement Housing Social Support  ADL's:  Intact  Cognition: WNL  Sleep:  Fair but reports nightmares   Screenings: AIMS    Flowsheet Row Video Visit from 10/28/2020 in [place] Total Score 0      GAD-7    Flowsheet Row Video Visit from 10/28/2020 in [place] Office Visit from 05/14/2020 in [facility]  Total GAD-7 Score 0 4      PHQ2-9    [place] from 12/31/2020 in [place] Visit from 11/17/2020 in [facility] Video Visit from 10/28/2020 in [place] Video Visit from 10/14/2020 in [place] Counselor from 10/04/2020 in [place]  PHQ-2 Total Score 0 0 _0 PHQ-9 Total Score -- 0 3 18 --      Flowsheet Row Counselor from 12/31/2020 in [place] Counselor from 10/04/2020 in [place] Visit from 07/27/2020 in [place] Low Risk Low Risk Low Risk        Assessment and Plan: Kathryn Munoz is a 45 year old African-American female, married, employed, lives in [place], has a history of bipolar disorder, sleep problems, OCD, hypothyroidism was evaluated by telemedicine today.  Patient is currently improving although she continues to have nightmares, is interested in working with her therapist for the same.  Discussed plan as noted below.  Plan Bipolar disorder in remission Lamotrigine 175 mg p.o. daily Celexa 20 mg p.o. daily-reduced due to sexual dysfunction  OCD-stable Continue CBT with Ms. Christina Hussami Celexa 20 mg p.o. daily  Insomnia-improving Hydroxyzine 25-50 mg p.o. nightly as needed Trazodone 50 mg at bedtime Advised to monitor nightmares, work with  her therapist.  We will reevaluate.  Follow-up in clinic in 2 months or sooner if needed.  This note was generated in part or whole with voice recognition software. Voice recognition is usually quite accurate but there are transcription errors that can and very often do occur. I apologize for any typographical errors that were not detected and corrected.       Ursula Alert, MD 03/18/2021, 10:56 AM

## 2021-03-25 ENCOUNTER — Encounter (INDEPENDENT_AMBULATORY_CARE_PROVIDER_SITE_OTHER): Payer: Self-pay | Admitting: Family Medicine

## 2021-03-29 ENCOUNTER — Other Ambulatory Visit: Payer: Self-pay

## 2021-03-29 ENCOUNTER — Telehealth (INDEPENDENT_AMBULATORY_CARE_PROVIDER_SITE_OTHER): Payer: BC Managed Care – PPO | Admitting: Family Medicine

## 2021-03-29 ENCOUNTER — Encounter (INDEPENDENT_AMBULATORY_CARE_PROVIDER_SITE_OTHER): Payer: Self-pay | Admitting: Family Medicine

## 2021-03-29 DIAGNOSIS — K5903 Drug induced constipation: Secondary | ICD-10-CM

## 2021-03-29 DIAGNOSIS — E559 Vitamin D deficiency, unspecified: Secondary | ICD-10-CM

## 2021-03-29 DIAGNOSIS — E8881 Metabolic syndrome: Secondary | ICD-10-CM | POA: Diagnosis not present

## 2021-03-29 DIAGNOSIS — Z6839 Body mass index (BMI) 39.0-39.9, adult: Secondary | ICD-10-CM

## 2021-03-29 MED ORDER — VITAMIN D (ERGOCALCIFEROL) 1.25 MG (50000 UNIT) PO CAPS: 50000.0000 [IU] | ORAL_CAPSULE | ORAL | 0 refills | Status: DC

## 2021-03-29 MED ORDER — SAXENDA 18 MG/3ML ~~LOC~~ SOPN: 3.0000 mg | PEN_INJECTOR | Freq: Every day | SUBCUTANEOUS | 0 refills | Status: DC

## 2021-03-29 MED ORDER — LINACLOTIDE 145 MCG PO CAPS: ORAL_CAPSULE | ORAL | 0 refills | Status: DC

## 2021-03-30 NOTE — Progress Notes (Signed)
TeleHealth Visit:  Due to the COVID-19 pandemic, this visit was completed with telemedicine (audio/video) technology to reduce patient and provider exposure as well as to preserve personal protective equipment.   Kathryn Munoz has verbally consented to this TeleHealth visit. The patient is located at home, the provider is located at the [facility] office. The participants in this visit include the listed provider and patient and. The visit was conducted today via video.  Chief Complaint: OBESITY Kathryn Munoz is here to discuss her progress with her obesity treatment plan along with follow-up of her obesity related diagnoses. Kathryn Munoz is on the Category 3 Plan and states she is following her eating plan approximately 0% of the time. Kathryn Munoz states she is doing 0 minutes 0 times per week.  Today's visit was #: 28 Starting weight: 252 lbs Starting date: 07/16/2020  Interim History: Ahmiah feels she did well on plan over Christmas. She is COVID positive and has been unable to adhere well on plan this week. Her protein intake is down. She has  been off of Saxenda this week due to having COVID.  Subjective:   1. Insulin resistance Kathryn Munoz has been off of Saxenda since she was diagnosed with COVID. Her appetite well controlled with 3.0 mg generally.   Lab Results  Component Value Date   INSULIN 83.7 (H) 02/02/2021   INSULIN 42.7 (H) 08/05/2020   INSULIN 46.7 (H) 05/04/2020   INSULIN 29.0 (H) 01/07/2020   INSULIN 32.5 (H) 10/14/2019   Lab Results  Component Value Date   HGBA1C 5.1 02/02/2021    2. Drug-induced constipation Kathryn Munoz's constipation is managed well with Linzess.  3. Vitamin D deficiency Kathryn Munoz's Vitamin D is not at goal (31.5). She is on weekly prescription Vitamin D.  Lab Results  Component Value Date   VD25OH 31.5 02/02/2021   VD25OH 49.6 08/05/2020   VD25OH 37.7 05/04/2020    Assessment/Plan:   1. Insulin resistance We will refill Saxenda 3.0 mg  daily. She will titrate back up from 0.6 mg once she restarts it. Kathryn Munoz will continue to work on weight loss, exercise, and decreasing simple carbohydrates to help decrease the risk of diabetes. Kathryn Munoz agreed to follow-up with [place] as directed to closely monitor her progress.  - Liraglutide -Weight Management (SAXENDA) 18 MG/3ML SOPN; Inject 3 mg into the skin daily.  Dispense: 15 mL; Refill: 0  2. Drug-induced constipation We will refill Linzess 154 mcg caps every day for 1 month with no refills.   - linaclotide (LINZESS) 145 MCG CAPS capsule; TAKE 1 CAPSULE BY MOUTH EVERY DAY BEFORE BREAKFAST  Dispense: 30 capsule; Refill: 0  3. Vitamin D deficiency Low Vitamin D level contributes to fatigue and are associated with obesity, breast, and colon cancer. We will refill prescription Vitamin D 50,000 IU every week and Kathryn Munoz will follow-up for routine testing of Vitamin D, at least 2-3 times per year to avoid over-replacement.  - Vitamin D, Ergocalciferol, (DRISDOL) 1.25 MG (50000 UNIT) CAPS capsule; Take 1 capsule (50,000 Units total) by mouth every 7 (seven) days.  Dispense: 4 capsule; Refill: 0  4. Obesity with current BMI 39.44 Kathryn Munoz is currently in the action stage of change. As such, her goal is to continue with weight loss efforts. She has agreed to the Category 3 Plan.   Kathryn Munoz will work on increasing protein while ill.  Exercise goals: No exercise has been prescribed at this time.  Behavioral modification strategies: increasing lean protein intake.  Kathryn Munoz has agreed to  follow-up with our clinic in 3-4 weeks. She was informed of the importance of frequent follow-up visits to maximize her success with intensive lifestyle modifications for her multiple health conditions.  Objective:   VITALS: Per patient if applicable, see vitals. GENERAL: Alert and in no acute distress. CARDIOPULMONARY: No increased WOB. Speaking in clear sentences.  PSYCH: Pleasant and cooperative. Speech  normal rate and rhythm. Affect is appropriate. Insight and judgement are appropriate. Attention is focused, linear, and appropriate.  NEURO: Oriented as arrived to appointment on time with no prompting.   Lab Results  Component Value Date   CREATININE 0.97 02/02/2021   BUN 10 02/02/2021   NA 138 02/02/2021   K 4.8 02/02/2021   CL 101 02/02/2021   CO2 21 02/02/2021   Lab Results  Component Value Date   ALT 15 02/02/2021   AST 20 02/02/2021   ALKPHOS 63 02/02/2021   BILITOT 0.5 02/02/2021   Lab Results  Component Value Date   HGBA1C 5.1 02/02/2021   HGBA1C 5.3 08/05/2020   HGBA1C 5.0 05/04/2020   HGBA1C 5.2 01/07/2020   HGBA1C 5.2 10/14/2019   Lab Results  Component Value Date   INSULIN 83.7 (H) 02/02/2021   INSULIN 42.7 (H) 08/05/2020   INSULIN 46.7 (H) 05/04/2020   INSULIN 29.0 (H) 01/07/2020   INSULIN 32.5 (H) 10/14/2019   Lab Results  Component Value Date   TSH 3.800 05/04/2020   Lab Results  Component Value Date   CHOL 176 05/04/2020   HDL 61 05/04/2020   LDLCALC 93 05/04/2020   TRIG 123 05/04/2020   CHOLHDL 3.7 03/31/2019   Lab Results  Component Value Date   VD25OH 31.5 02/02/2021   VD25OH 49.6 08/05/2020   VD25OH 37.7 05/04/2020   Lab Results  Component Value Date   WBC 6.7 05/14/2020   HGB 12.3 05/14/2020   HCT 37.3 05/14/2020   MCV 94.2 05/14/2020   PLT 349 05/14/2020   No results found for: IRON, TIBC, FERRITIN  Attestation Statements:   Reviewed by clinician on day of visit: allergies, medications, problem list, medical history, surgical history, family history, social history, and previous encounter notes.  I, Lizbeth Bark, RMA, am acting as Location manager for [employer], [place].  I have reviewed the above documentation for accuracy and completeness, and I agree with the above. -

## 2021-04-01 ENCOUNTER — Encounter: Payer: Self-pay | Admitting: Nurse Practitioner

## 2021-04-11 ENCOUNTER — Ambulatory Visit (INDEPENDENT_AMBULATORY_CARE_PROVIDER_SITE_OTHER): Payer: BC Managed Care – PPO | Admitting: Licensed Clinical Social Worker

## 2021-04-11 ENCOUNTER — Other Ambulatory Visit: Payer: Self-pay

## 2021-04-11 ENCOUNTER — Encounter: Payer: Self-pay | Admitting: Licensed Clinical Social Worker

## 2021-04-11 DIAGNOSIS — Z634 Disappearance and death of family member: Secondary | ICD-10-CM

## 2021-04-11 DIAGNOSIS — F3176 Bipolar disorder, in full remission, most recent episode depressed: Secondary | ICD-10-CM | POA: Diagnosis not present

## 2021-04-11 NOTE — Progress Notes (Signed)
Virtual Visit via Audio Note  I connected with Kathryn Munoz on 04/11/21 at  1:00 PM EST by an audio enabled telemedicine application and verified that I am speaking with the correct person using two identifiers.  Location: Patient:work/personal vehicle Provider: remote office [place], [place])   I discussed the limitations of evaluation and management by telemedicine and the availability of in person appointments. The patient expressed understanding and agreed to proceed.  I discussed the assessment and treatment plan with the patient. The patient was provided an opportunity to ask questions and all were answered. The patient agreed with the plan and demonstrated an understanding of the instructions.   The patient was advised to call back or seek an in-person evaluation if the symptoms worsen or if the condition fails to improve as anticipated.  I provided 55 minutes of non-face-to-face time during this encounter.   Kathryn Munoz R Kathryn Giorgio, LCSW   THERAPIST PROGRESS NOTE  Session Time: 1-155p  Participation Level: Active  Behavioral Response: Neat and Well GroomedAlertDepressed  Type of Therapy: Individual Therapy  Treatment Goals addressed:  Goal: LTG: Stabilize mood and increase goal-directed behavior: Input needed on appropriate metric Outcome: Progressing  Goal: STG: [name] will attend at least 80% of scheduled follow-up medication management appointments Outcome: Progressing Interventions:   Intervention: Discuss self-management skills Intervention: Assist patient to identify own strengths and abilities  Summary: Kathryn Munoz is a 46 y.o. female who presents with improving symptoms related to bipolar disorder diagnosis. Pt reports that overall mood is stable and that she feels she is managing situational stressors well.  Pt reports that she is compliant with medication.  Pt does report fluctuating quality and quantity of sleep due to continuing nightmares.    Allowed pt to explore and express thoughts and feelings associated with recent life situations and external stressors   Continued recommendations are as follows: self care behaviors, positive social engagements, focusing on overall work/home/life balance, and focusing on positive physical and emotional wellness.   Suicidal/Homicidal: No  Therapist Response: Pt is continuing to apply interventions learned in session into daily life situations. Pt is currently on track to meet goals utilizing interventions mentioned above. Personal growth and progress noted. Treatment to continue as indicated.   Plan: Return again in 4 weeks.  Diagnosis: Axis I: Bipolar disorder, depressed, remission    Axis II: No diagnosis    55 Ramiah Helfrich, LCSW 04/11/2021

## 2021-04-12 ENCOUNTER — Encounter: Payer: Self-pay | Admitting: Licensed Clinical Social Worker

## 2021-04-12 DIAGNOSIS — N921 Excessive and frequent menstruation with irregular cycle: Secondary | ICD-10-CM | POA: Insufficient documentation

## 2021-04-12 NOTE — Plan of Care (Signed)
°  Problem: Bipolar Disorder CCP Problem  1 Alleviate depressive/manic symptoms and return to improved levels of effective functioning. Goal: LTG: Stabilize mood and increase goal-directed behavior: Input needed on appropriate metric Outcome: Progressing Goal: STG: [name] will attend at least 80% of scheduled follow-up medication management appointments Outcome: Progressing Intervention: Dietary behavior modification (C-1) Intervention: Discuss self-management skills Intervention: Assist patient to identify own strengths and abilities

## 2021-04-20 ENCOUNTER — Ambulatory Visit (INDEPENDENT_AMBULATORY_CARE_PROVIDER_SITE_OTHER): Payer: BC Managed Care – PPO | Admitting: Family Medicine

## 2021-04-20 ENCOUNTER — Encounter (INDEPENDENT_AMBULATORY_CARE_PROVIDER_SITE_OTHER): Payer: Self-pay | Admitting: Family Medicine

## 2021-04-20 ENCOUNTER — Other Ambulatory Visit: Payer: Self-pay

## 2021-04-20 VITALS — BP 120/76 | HR 82 | Temp 98.0°F | Ht 65.0 in | Wt 235.0 lb

## 2021-04-20 DIAGNOSIS — E559 Vitamin D deficiency, unspecified: Secondary | ICD-10-CM | POA: Diagnosis not present

## 2021-04-20 DIAGNOSIS — E8881 Metabolic syndrome: Secondary | ICD-10-CM | POA: Diagnosis not present

## 2021-04-20 DIAGNOSIS — Z6839 Body mass index (BMI) 39.0-39.9, adult: Secondary | ICD-10-CM

## 2021-04-20 DIAGNOSIS — E669 Obesity, unspecified: Secondary | ICD-10-CM

## 2021-04-20 DIAGNOSIS — K5903 Drug induced constipation: Secondary | ICD-10-CM

## 2021-04-20 DIAGNOSIS — Z6841 Body Mass Index (BMI) 40.0 and over, adult: Secondary | ICD-10-CM

## 2021-04-20 MED ORDER — SAXENDA 18 MG/3ML ~~LOC~~ SOPN: 3.0000 mg | PEN_INJECTOR | Freq: Every day | SUBCUTANEOUS | 0 refills | Status: DC

## 2021-04-20 MED ORDER — LINACLOTIDE 145 MCG PO CAPS: ORAL_CAPSULE | ORAL | 0 refills | Status: DC

## 2021-04-20 MED ORDER — VITAMIN D (ERGOCALCIFEROL) 1.25 MG (50000 UNIT) PO CAPS: 50000.0000 [IU] | ORAL_CAPSULE | ORAL | 0 refills | Status: DC

## 2021-04-20 NOTE — Progress Notes (Signed)
Chief Complaint:   OBESITY Kathryn Munoz is here to discuss her progress with her obesity treatment plan along with follow-up of her obesity related diagnoses. Kathryn Munoz is on the Category 3 Plan and states she is following her eating plan approximately 35% of the time. Kathryn Munoz states she is doing 0 minutes 0 times per week.  Today's visit was #: 42 Starting weight: 252 lbs Starting date: 07/16/2020 Today's weight: 235 lbs Today's date: 04/20/2021 Total lbs lost to date: 17 lbs Total lbs lost since last in-office visit: 2 lbs  Interim History: Kathryn Munoz has not adhered to plan the past week or so because she has not had the groceries she needed for dinner. This promotes dining out more. She had COVID in January and was off of Saxenda.  She has now titrated back up to 3.0 mg daily and notes good appetite control. She will be having surgery for trigger finger release April 22, 2021. Subjective:   1. Insulin resistance Kathryn Munoz is on Saxenda 3.0 mg and tolerating it well. Her appetite is satisfied. She is not snacking much.   Lab Results  Component Value Date   HGBA1C 5.1 02/02/2021   Lab Results  Component Value Date   INSULIN 83.7 (H) 02/02/2021   INSULIN 42.7 (H) 08/05/2020   INSULIN 46.7 (H) 05/04/2020   INSULIN 29.0 (H) 01/07/2020   INSULIN 32.5 (H) 10/14/2019    2. Drug-induced constipation Kathryn Munoz notes less frequent bowel movements but they are not hard.  She is currently taking Linzess 145 mcg daily.  3. Vitamin D deficiency Kathryn Munoz's Vitamin D is at goal. Last check it was 31.5. She is on weekly prescription Vitamin D.  Lab Results  Component Value Date   VD25OH 31.5 02/02/2021   VD25OH 49.6 08/05/2020   VD25OH 37.7 05/04/2020    Assessment/Plan:   1. Insulin resistance We will refill Saxenda 3.0 mg subcutaneous daily.  - Liraglutide -Weight Management (SAXENDA) 18 MG/3ML SOPN; Inject 3 mg into the skin daily.  Dispense: 15 mL; Refill: 0  2. Drug-induced  constipation We will refill Linzess 145 mcg daily. Talaysha will increase vegetable intake.   - linaclotide (LINZESS) 145 MCG CAPS capsule; TAKE 1 CAPSULE BY MOUTH EVERY DAY BEFORE BREAKFAST  Dispense: 30 capsule; Refill: 0  3. Vitamin D deficiency  We will refill prescription Vitamin D 50,000 IU weekly and Odessia will follow-up for routine testing of Vitamin D, at least 2-3 times per year to avoid over-replacement. - Vitamin D, Ergocalciferol, (DRISDOL) 1.25 MG (50000 UNIT) CAPS capsule; Take 1 capsule (50,000 Units total) by mouth every 7 (seven) days.  Dispense: 4 capsule; Refill: 0  4. Obesity with current BMI 39.11 Kathryn Munoz is currently in the action stage of change. As such, her goal is to continue with weight loss efforts. She has agreed to the Category 3 Plan.   Kathryn Munoz will do a weekly shopping trip.  She will weigh in every few weeks at home to stay on track while recovering from her surgery.  Exercise goals: No exercise has been prescribed at this time.  Behavioral modification strategies: decreasing eating out.  She requests follow-up appointment in 6 weeks due to her recovery from upcoming surgery.   Follow-up with our clinic in 6 weeks with Kathryn Marble, NP.  Objective:   Blood pressure 120/76, pulse 82, temperature 98 F (36.7 C), height 5\' 5"  (1.651 m), weight 235 lb (106.6 kg), SpO2 96 %. Body mass index is 39.11 kg/m.  General: Cooperative, alert, well  developed, in no acute distress. HEENT: Conjunctivae and lids unremarkable. Cardiovascular: Regular rhythm.  Lungs: Normal work of breathing. Neurologic: No focal deficits.   Lab Results  Component Value Date   CREATININE 0.97 02/02/2021   BUN 10 02/02/2021   NA 138 02/02/2021   K 4.8 02/02/2021   CL 101 02/02/2021   CO2 21 02/02/2021   Lab Results  Component Value Date   ALT 15 02/02/2021   AST 20 02/02/2021   ALKPHOS 63 02/02/2021   BILITOT 0.5 02/02/2021   Lab Results  Component Value Date    HGBA1C 5.1 02/02/2021   HGBA1C 5.3 08/05/2020   HGBA1C 5.0 05/04/2020   HGBA1C 5.2 01/07/2020   HGBA1C 5.2 10/14/2019   Lab Results  Component Value Date   INSULIN 83.7 (H) 02/02/2021   INSULIN 42.7 (H) 08/05/2020   INSULIN 46.7 (H) 05/04/2020   INSULIN 29.0 (H) 01/07/2020   INSULIN 32.5 (H) 10/14/2019   Lab Results  Component Value Date   TSH 3.800 05/04/2020   Lab Results  Component Value Date   CHOL 176 05/04/2020   HDL 61 05/04/2020   LDLCALC 93 05/04/2020   TRIG 123 05/04/2020   CHOLHDL 3.7 03/31/2019   Lab Results  Component Value Date   VD25OH 31.5 02/02/2021   VD25OH 49.6 08/05/2020   VD25OH 37.7 05/04/2020   Lab Results  Component Value Date   WBC 6.7 05/14/2020   HGB 12.3 05/14/2020   HCT 37.3 05/14/2020   MCV 94.2 05/14/2020   PLT 349 05/14/2020   No results found for: IRON, TIBC, FERRITIN  Attestation Statements:   Reviewed by clinician on day of visit: allergies, medications, problem list, medical history, surgical history, family history, social history, and previous encounter notes.  I, Lizbeth Bark, RMA, am acting as Location manager for [employer], [place].  I have reviewed the above documentation for accuracy and completeness, and I agree with the above. -  Georgianne Fick, FNP

## 2021-04-21 ENCOUNTER — Encounter (INDEPENDENT_AMBULATORY_CARE_PROVIDER_SITE_OTHER): Payer: Self-pay | Admitting: Family Medicine

## 2021-04-21 HISTORY — PX: TRIGGER FINGER RELEASE: SHX641

## 2021-05-04 ENCOUNTER — Other Ambulatory Visit: Payer: Self-pay

## 2021-05-04 ENCOUNTER — Encounter: Payer: Self-pay | Admitting: Psychiatry

## 2021-05-04 ENCOUNTER — Ambulatory Visit: Payer: BC Managed Care – PPO | Admitting: Psychiatry

## 2021-05-04 VITALS — BP 117/56 | HR 85

## 2021-05-04 DIAGNOSIS — F429 Obsessive-compulsive disorder, unspecified: Secondary | ICD-10-CM

## 2021-05-04 DIAGNOSIS — F5105 Insomnia due to other mental disorder: Secondary | ICD-10-CM | POA: Diagnosis not present

## 2021-05-04 DIAGNOSIS — F3176 Bipolar disorder, in full remission, most recent episode depressed: Secondary | ICD-10-CM | POA: Diagnosis not present

## 2021-05-04 MED ORDER — LAMOTRIGINE 25 MG PO TABS: 25.0000 mg | ORAL_TABLET | Freq: Every day | ORAL | 0 refills | Status: DC

## 2021-05-04 NOTE — Progress Notes (Signed)
[facility] MD OP Progress Note  05/04/2021 5:20 PM Kathryn Munoz  MRN:  [id number]  Chief Complaint:  Chief Complaint  Patient presents with   Follow-up 46 year old African-American female with history of bipolar disorder, insomnia, OCD was evaluated for medication management.   HPI: Kathryn Munoz is a 46 year old African-American female, lives in [place], has a history of bipolar disorder, insomnia, insulin resistance, hypothyroidism, other specified eating disorder was evaluated in office today.  Patient today is celebrating her 52th birthday.  Patient reports she is excited about that.  She reports she has been thinking about making some changes in her life.  She is thinking about going back to school again.  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  Patient reports overall mood symptoms are currently stable.  Does not feel depressed.  Patient reports sleep is overall okay as long as she takes the trazodone.  Denies any nightmares at this time.  Denies suicidality, homicidality or perceptual disturbances.  Patient is compliant on medications, denies side effects.  Continues to follow-up with therapist, reports it is beneficial.  Had surgery for trigger finger on her right hand on February 3.  Currently pain has improved.  Has upcoming appointment with her surgeon on Friday.    Visit Diagnosis:    ICD-10-CM   1. Bipolar disorder, in full remission, most recent episode depressed (HCC)  F31.76 lamoTRIgine (LAMICTAL) 25 MG tablet   type 2    2. Insomnia due to mental condition  F51.05    mood    3. 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  F42.9       Past Psychiatric History: Reviewed past psychiatric history from progress note on 11/08/2017.  Past trials of Celexa.  Patient with history of suicide attempt at the age of 36, overdosed on pills, was admitted to the hospital.  Another episode at the age of 25, slept through it and did not tell  anyone.  Past Medical History:  Past Medical History:  Diagnosis Date   Abnormal mammogram    breast biopsy, [facility] 2014   ADD (attention deficit disorder)    Anxiety    Bipolar 2 disorder ([place])    Constipation    Depression    Dermatitis    Heavy menstrual bleeding    History of gestational diabetes    History of kidney stones 2014   Hypothyroidism    IFG (impaired fasting glucose)    Insulin resistance    Irregular periods    Joint pain    Multiple food allergies    Dates and nuts   Neuropathy    Obesity    OCD (obsessive compulsive disorder)    Prediabetes    Vitamin D deficiency disease     Past Surgical History:  Procedure Laterality Date   BREAST BIOPSY Left 2014   [place] bx/clip-neg   INTRAUTERINE DEVICE INSERTION  03/11/2012    Family Psychiatric History: Reviewed family psychiatric history from progress note on 11/08/2017.  Family History:  Family History  Problem Relation Age of Onset   Diabetes Mother    Pancreatitis Mother    Hypertension Mother    Alcohol abuse Mother    Schizophrenia Mother    Depression Mother    Heart disease Mother    Stroke Mother    Thyroid disease Mother    Kidney disease Mother    Anxiety disorder Mother    Alcohol abuse Father    Diabetes Sister  pre-diabetic   Diabetes Brother    Drug abuse Brother    ADD / ADHD Son    Eczema Son    Breast cancer Cousin        pat cousin   Anxiety disorder Other    Ovarian cancer Neg Hx     Social History: Reviewed social history from progress note on 11/08/2017. Social History   Socioeconomic History   Marital status: Married    Spouse name: Agueda Houpt   Number of children: 2   Years of education: Not on file   Highest education level: Bachelor's degree (e.g., BA, AB, BS)  Occupational History   Occupation: English as a second language teacher  Tobacco Use   Smoking status: Never   Smokeless tobacco: Never  Vaping Use   Vaping Use: Never used  Substance and Sexual Activity   Alcohol use:  No   Drug use: No   Sexual activity: Yes    Partners: Male    Birth control/protection: Other-see comments    Comment: nuvaring  Other Topics Concern   Not on file  Social History Narrative   Not on file   Social Determinants of Health   Financial Resource Strain: Low Risk    Difficulty of Paying Living Expenses: Not hard at all  Food Insecurity: No Food Insecurity   Worried About Charity fundraiser in the Last Year: Never true   [place] in the Last Year: Never true  Transportation Needs: No Transportation Needs   Lack of Transportation (Medical): No   Lack of Transportation (Non-Medical): No  Physical Activity: Insufficiently Active   Days of Exercise per Week: 7 days   Minutes of Exercise per Session: 10 min  Stress: No Stress Concern Present   Feeling of Stress : Not at all  Social Connections: Moderately Integrated   Frequency of Communication with Friends and Family: Three times a week   Frequency of Social Gatherings with Friends and Family: Three times a week   Attends Religious Services: 1 to 4 times per year   Active Member of Clubs or Organizations: No   Attends Archivist Meetings: Never   Marital Status: Married    Allergies:  Allergies  Allergen Reactions   Date Seed Extract  [Zizyphus Jujuba] Anaphylaxis, Cough, Itching and Swelling   Other Swelling    Metabolic Disorder Labs: Lab Results  Component Value Date   HGBA1C 5.1 02/02/2021   MPG 117 03/31/2019   MPG 91 05/09/2018   No results found for: PROLACTIN Lab Results  Component Value Date   CHOL 176 05/04/2020   TRIG 123 05/04/2020   HDL 61 05/04/2020   CHOLHDL 3.7 03/31/2019   VLDL 14 10/16/2016   LDLCALC 93 05/04/2020   LDLCALC 113 (H) 10/14/2019   Lab Results  Component Value Date   TSH 3.800 05/04/2020   TSH 2.210 07/17/2019    Therapeutic Level Labs: No results found for: LITHIUM No results found for: VALPROATE No components found for:  CBMZ  Current  Medications: Current Outpatient Medications  Medication Sig Dispense Refill   Cholecalciferol (VITAMIN D) 50 MCG (2000 UT) tablet Take 2,000 Units by mouth daily.     citalopram (CELEXA) 20 MG tablet Take 1 tablet (20 mg total) by mouth daily. 90 tablet 1   EPINEPHrine 0.3 mg/0.3 mL IJ SOAJ injection INJECT 0.3 MLS INTO THE MUSCLE ONCE FOR 1 DOSE. FOR LIFE-THREATENING ALLERGIC REACTION / ANAPHYLAXIS  1   etonogestrel-ethinyl estradiol (NUVARING) 0.12-0.015 MG/24HR vaginal ring Place  1 each vaginally every 28 (twenty-eight) days. Insert vaginally and leave in place for 3 consecutive weeks, then remove for 1 week. 3 each 3   FLOWFLEX COVID-19 AG HOME TEST KIT FOLLOW INSTRUCTIONS INCLUDED WITH THE PACKAGE.     hydrOXYzine (VISTARIL) 25 MG capsule Take 1-2 capsules (25-50 mg total) by mouth at bedtime as needed. For sleep 60 capsule 1   ibuprofen (ADVIL) 600 MG tablet ibuprofen 600 mg tablet  Take 1 tablet 3 times a day by oral route.     Insulin Pen Needle 32G X 6 MM MISC Will use with Saxenda daily 100 each 0   lamoTRIgine (LAMICTAL) 150 MG tablet Take 1 tablet (150 mg total) by mouth daily. Take along with 25 mg daily 90 tablet 1   levothyroxine (SYNTHROID) 75 MCG tablet TAKE 1 TABLET BY MOUTH EVERY DAY BEFORE BREAKFAST 90 tablet 0   linaclotide (LINZESS) 145 MCG CAPS capsule TAKE 1 CAPSULE BY MOUTH EVERY DAY BEFORE BREAKFAST 30 capsule 0   Liraglutide -Weight Management (SAXENDA) 18 MG/3ML SOPN Inject 3 mg into the skin daily. 15 mL 0   metFORMIN (GLUCOPHAGE-XR) 500 MG 24 hr tablet TAKE 3 TABLETS (1,500 MG TOTAL) BY MOUTH DAILY. 270 tablet 3   polyethylene glycol (MIRALAX / GLYCOLAX) packet Take 17 g by mouth as needed.      traZODone (DESYREL) 50 MG tablet Take 1-2 tablets (50-100 mg total) by mouth at bedtime as needed for sleep. 180 tablet 0   Vitamin D, Ergocalciferol, (DRISDOL) 1.25 MG (50000 UNIT) CAPS capsule Take 1 capsule (50,000 Units total) by mouth every 7 (seven) days. 4 capsule 0    lamoTRIgine (LAMICTAL) 25 MG tablet Take 1 tablet (25 mg total) by mouth daily. Take along with 150 mg daily 90 tablet 0   ondansetron (ZOFRAN-ODT) 4 MG disintegrating tablet ondansetron 4 mg disintegrating tablet  Take one tablet po q 12 prn nausea. (Patient not taking: Reported on 05/04/2021)     oxyCODONE (OXY IR/ROXICODONE) 5 MG immediate release tablet oxycodone 5 mg tablet  Take 1 tablet every 4 hours by oral route. (Patient not taking: Reported on 05/04/2021)     No current facility-administered medications for this visit.     Musculoskeletal: Strength & Muscle Tone: within normal limits Gait & Station: normal Patient leans: N/A  Psychiatric Specialty Exam: Review of Systems  Musculoskeletal:        Rt.hand S/P surgery for trigger finger- pain improving  Psychiatric/Behavioral:  Negative for agitation, behavioral problems, confusion, decreased concentration, hallucinations, self-injury, sleep disturbance and suicidal ideas.   All other systems reviewed and are negative.  Blood pressure (!) 117/56, pulse 85.There is no height or weight on file to calculate BMI.  General Appearance: Casual  Eye Contact:  Fair  Speech:  Clear and Coherent  Volume:  Normal  Mood:  Euthymic  Affect:  Congruent  Thought Process:  Goal Directed and Descriptions of Associations: Intact  Orientation:  Full (Time, Place, and Person)  Thought Content: Logical   Suicidal Thoughts:  No  Homicidal Thoughts:  No  Memory:  Immediate;   Fair Recent;   Fair Remote;   Fair  Judgement:  Fair  Insight:  Fair  Psychomotor Activity:  Normal  Concentration:  Concentration: Fair and Attention Span: Fair  Recall:  [employer] of Knowledge: Fair  Language: Fair  Akathisia:  No  Handed:  Right  AIMS (if indicated): done,0  Assets:  Communication Skills Desire for [place] Talents/Skills  Transportation  ADL's:  Intact  Cognition: WNL  Sleep:  Fair   Screenings: AIMS     Flowsheet Row Video Visit from 10/28/2020 in [facility] Total Score 0      GAD-7    Flowsheet Row Video Visit from 10/28/2020 in [facility] Office Visit from 05/14/2020 in [facility]  Total GAD-7 Score 0 4      PHQ2-9    [place] Office Visit from 05/04/2021 in [facility] Counselor from 12/31/2020 in [facility] Office Visit from 11/17/2020 in [facility] Video Visit from 10/28/2020 in [facility] Video Visit from 10/14/2020 in [facility]  PHQ-2 Total Score 2 0 0 1 6  PHQ-9 Total Score 7 -- 0 3 18      [place] Visit from 05/04/2021 in [facility] Counselor from 12/31/2020 in [facility] Counselor from 10/04/2020 in [facility] Low Risk Low Risk Low Risk        Assessment and Plan: CLIMMIE CRONCE is a 46 year old African-American female, married, employed, lives in [place], has a history of bipolar disorder, sleep problems, OCD, hypothyroidism was evaluated in office today.  Patient is currently stable.  Plan as noted below.  Plan Bipolar disorder in remission Lamotrigine 175 mg p.o. daily Celexa 20 mg p.o. daily-reduced due to sexual dysfunction.  OCD-stable Continue CBT with Ms. Christina Hussami. Celexa 20 mg p.o. daily  Insomnia-improving Hydroxyzine 25-50 mg p.o. nightly as needed Trazodone 50 mg at bedtime     Collaboration of Care: Collaboration of Care: Referral or follow-up with counselor/therapist AEB encouraged to follow up .  Patient/Guardian was advised Release of Information must be obtained prior to any record release in order to collaborate their care with an outside provider. Patient/Guardian was advised if they  have not already done so to contact the registration department to sign all necessary forms in order for [place] to release information regarding their care.   Consent: Patient/Guardian gives verbal consent for treatment and assignment of benefits for services provided during this visit. Patient/Guardian expressed understanding and agreed to proceed.   Follow-up in clinic in 3 months or sooner if needed.  This note was generated in part or whole with voice recognition software. Voice recognition is usually quite accurate but there are transcription errors that can and very often do occur. I apologize for any typographical errors that were not detected and corrected.      Ursula Alert, MD 05/04/2021, 5:20 PM

## 2021-05-17 ENCOUNTER — Ambulatory Visit (INDEPENDENT_AMBULATORY_CARE_PROVIDER_SITE_OTHER): Payer: BC Managed Care – PPO | Admitting: Obstetrics and Gynecology

## 2021-05-17 ENCOUNTER — Other Ambulatory Visit: Payer: Self-pay

## 2021-05-17 ENCOUNTER — Encounter: Payer: Self-pay | Admitting: Obstetrics and Gynecology

## 2021-05-17 ENCOUNTER — Other Ambulatory Visit (INDEPENDENT_AMBULATORY_CARE_PROVIDER_SITE_OTHER): Payer: Self-pay | Admitting: Bariatrics

## 2021-05-17 DIAGNOSIS — N921 Excessive and frequent menstruation with irregular cycle: Secondary | ICD-10-CM

## 2021-05-17 DIAGNOSIS — N920 Excessive and frequent menstruation with regular cycle: Secondary | ICD-10-CM

## 2021-05-17 DIAGNOSIS — E038 Other specified hypothyroidism: Secondary | ICD-10-CM

## 2021-05-17 NOTE — Telephone Encounter (Signed)
Refill request

## 2021-05-17 NOTE — Progress Notes (Signed)
Patient presents today due to abnormal bleeding. Patient states she has been consistently bleeding since 04/13/2021. She reports this bleeding has been heavy, recently started to get lighter but has not stopped. Patient states she has been experiencing lightheadedness, dizziness and weakness.Patient states no other questions or concerns at this time.

## 2021-05-17 NOTE — Progress Notes (Signed)
HPI:      [name] is a 46 y.o. [id number] who LMP was Patient's last menstrual period was 04/13/2021 (exact date).  Subjective:   She presents today stating that she began bleeding at the end of January and has bled every day since then.  She reports that some days the bleeding is heavy.  She reports that she is sometimes feeling tired and occasionally briefly lightheaded.  She reports that her bleeding is now decreasing significantly.  She is due to reinsert a NuvaRing. Prior to her last visit she had been experiencing some issues with heavy menstrual bleeding and has since that time been using the NuvaRing successfully to control her cycles. Several years ago she had an IUD placed but it was soon thereafter noted to be low in the endocervical canal.    Hx: The following portions of the patient's history were reviewed and updated as appropriate:             She  has a past medical history of Abnormal mammogram, ADD (attention deficit disorder), Anxiety, Bipolar 2 disorder ([place]), Constipation, Depression, Dermatitis, Heavy menstrual bleeding, History of gestational diabetes, History of kidney stones (2014), Hypothyroidism, IFG (impaired fasting glucose), Insulin resistance, Irregular periods, Joint pain, Multiple food allergies, Neuropathy, Obesity, OCD (obsessive compulsive disorder), Prediabetes, and Vitamin D deficiency disease. She does not have any pertinent problems on file. She  has a past surgical history that includes Intrauterine device insertion (03/11/2012); Breast biopsy (Left, 2014); and Trigger finger release (Right). Her family history includes ADD / ADHD in her son; Alcohol abuse in her father and mother; Anxiety disorder in her mother and another family member; Breast cancer in her cousin; Depression in her mother; Diabetes in her brother, mother, and sister; Drug abuse in her brother; Eczema in her son; Heart disease in her mother; Hypertension in her mother; Kidney disease  in her mother; Pancreatitis in her mother; Schizophrenia in her mother; Stroke in her mother; Thyroid disease in her mother. She  reports that she has never smoked. She has never used smokeless tobacco. She reports that she does not drink alcohol and does not use drugs. She has a current medication list which includes the following prescription(s): vitamin d, citalopram, epinephrine, etonogestrel-ethinyl estradiol, flowflex covid-19 ag home test, hydroxyzine, ibuprofen, insulin pen needle, lamotrigine, lamotrigine, levothyroxine, linaclotide, saxenda, metformin, polyethylene glycol, trazodone, vitamin d (ergocalciferol), ondansetron, and oxycodone. She is allergic to date seed extract  [zizyphus jujuba] and other.       Review of Systems:  Review of Systems  Constitutional: Denied constitutional symptoms, night sweats, recent illness, fatigue, fever, insomnia and weight loss.  Eyes: Denied eye symptoms, eye pain, photophobia, vision change and visual disturbance.  Ears/Nose/Throat/Neck: Denied ear, nose, throat or neck symptoms, hearing loss, nasal discharge, sinus congestion and sore throat.  Cardiovascular: Denied cardiovascular symptoms, arrhythmia, chest pain/pressure, edema, exercise intolerance, orthopnea and palpitations.  Respiratory: Denied pulmonary symptoms, asthma, pleuritic pain, productive sputum, cough, dyspnea and wheezing.  Gastrointestinal: Denied, gastro-esophageal reflux, melena, nausea and vomiting.  Genitourinary: See HPI for additional information.  Musculoskeletal: Denied musculoskeletal symptoms, stiffness, swelling, muscle weakness and myalgia.  Dermatologic: Denied dermatology symptoms, rash and scar.  Neurologic: Denied neurology symptoms, dizziness, headache, neck pain and syncope.  Psychiatric: Denied psychiatric symptoms, anxiety and depression.  Endocrine: Denied endocrine symptoms including hot flashes and night sweats.   Meds:   Current Outpatient Medications  on File Prior to Visit  Medication Sig Dispense Refill   Cholecalciferol (VITAMIN  D) 50 MCG (2000 UT) tablet Take 2,000 Units by mouth daily.     citalopram (CELEXA) 20 MG tablet Take 1 tablet (20 mg total) by mouth daily. 90 tablet 1   EPINEPHrine 0.3 mg/0.3 mL IJ SOAJ injection INJECT 0.3 MLS INTO THE MUSCLE ONCE FOR 1 DOSE. FOR LIFE-THREATENING ALLERGIC REACTION / ANAPHYLAXIS  1   etonogestrel-ethinyl estradiol (NUVARING) 0.12-0.015 MG/24HR vaginal ring Place 1 each vaginally every 28 (twenty-eight) days. Insert vaginally and leave in place for 3 consecutive weeks, then remove for 1 week. 3 each 3   FLOWFLEX COVID-19 AG HOME TEST KIT FOLLOW INSTRUCTIONS INCLUDED WITH THE PACKAGE.     hydrOXYzine (VISTARIL) 25 MG capsule Take 1-2 capsules (25-50 mg total) by mouth at bedtime as needed. For sleep 60 capsule 1   ibuprofen (ADVIL) 600 MG tablet ibuprofen 600 mg tablet  Take 1 tablet 3 times a day by oral route.     Insulin Pen Needle 32G X 6 MM MISC Will use with Saxenda daily 100 each 0   lamoTRIgine (LAMICTAL) 150 MG tablet Take 1 tablet (150 mg total) by mouth daily. Take along with 25 mg daily 90 tablet 1   lamoTRIgine (LAMICTAL) 25 MG tablet Take 1 tablet (25 mg total) by mouth daily. Take along with 150 mg daily 90 tablet 0   levothyroxine (SYNTHROID) 75 MCG tablet TAKE 1 TABLET BY MOUTH EVERY DAY BEFORE BREAKFAST 90 tablet 0   linaclotide (LINZESS) 145 MCG CAPS capsule TAKE 1 CAPSULE BY MOUTH EVERY DAY BEFORE BREAKFAST 30 capsule 0   Liraglutide -Weight Management (SAXENDA) 18 MG/3ML SOPN Inject 3 mg into the skin daily. 15 mL 0   metFORMIN (GLUCOPHAGE-XR) 500 MG 24 hr tablet TAKE 3 TABLETS (1,500 MG TOTAL) BY MOUTH DAILY. 270 tablet 3   polyethylene glycol (MIRALAX / GLYCOLAX) packet Take 17 g by mouth as needed.      traZODone (DESYREL) 50 MG tablet Take 1-2 tablets (50-100 mg total) by mouth at bedtime as needed for sleep. 180 tablet 0   Vitamin D, Ergocalciferol, (DRISDOL) 1.25 MG (50000  UNIT) CAPS capsule Take 1 capsule (50,000 Units total) by mouth every 7 (seven) days. 4 capsule 0   ondansetron (ZOFRAN-ODT) 4 MG disintegrating tablet ondansetron 4 mg disintegrating tablet  Take one tablet po q 12 prn nausea. (Patient not taking: Reported on 05/17/2021)     oxyCODONE (OXY IR/ROXICODONE) 5 MG immediate release tablet oxycodone 5 mg tablet  Take 1 tablet every 4 hours by oral route. (Patient not taking: Reported on 05/17/2021)     No current facility-administered medications on file prior to visit.      Objective:     There were no vitals filed for this visit. There were no vitals filed for this visit.                      Assessment:    [id number] Patient Active Problem List   Diagnosis Date Noted   Vertigo    Drug-induced constipation 05/24/2020   Other hyperlipidemia 05/24/2020   Insulin resistance 01/08/2020   Bipolar disorder, in full remission, most recent episode depressed ([place]) 12/23/2019   Heavy menstrual period 06/28/2019   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 01/01/2019   Bipolar 2 disorder, major depressive episode ([place]) 09/05/2018   Insomnia due to mental condition 09/05/2018   Class 3 severe obesity with serious comorbidity and body mass index (BMI) of 40.0 to 44.9 in adult [facility]) 05/09/2018  Metabolic syndrome 00/92/3300   Acanthosis nigricans 10/22/2017   Moderate episode of recurrent major depressive disorder ([place]) 10/22/2017   Acid reflux 04/01/2017   Anxiety 06/01/2016   Morbid obesity with BMI of 40.0-44.9, adult ([place]) 03/12/2015   Peripheral neuropathy 10/09/2014   Hypothyroidism    IFG (impaired fasting glucose)    Vitamin D deficiency    Neuropathy      1. Prolonged menstrual cycle   2. Menorrhagia with regular cycle     Patient having very heavy menstrual bleeding possibly acute anemia.  She does not believe she is pregnant but would like a pregnancy test.  She does report that she is using the NuvaRing  correctly     Plan:            1.  CBC  2.  hCG  3.  Pelvic ultrasound rule out uterine fibroids or significant endometrial hyperplasia Would consider endometrial biopsy in the near future. Management based on CBC and ultrasound findings as well as possible future endometrial biopsy.  4.  Patient to begin iron twice daily (samples given) Orders Orders Placed This Encounter  Procedures   CBC   hCG, serum, qualitative    No orders of the defined types were placed in this encounter.     F/U  No follow-ups on file. I spent 23 minutes involved in the care of this patient preparing to see the patient by obtaining and reviewing her medical history (including labs, imaging tests and prior procedures), documenting clinical information in the electronic health record (EHR), counseling and coordinating care plans, writing and sending prescriptions, ordering tests or procedures and in direct communicating with the patient and medical staff discussing pertinent items from her history and physical exam.  Finis Bud, M.D. 05/17/2021 3:35 PM

## 2021-05-18 LAB — CBC
Hematocrit: 37.6 % (ref 34.0–46.6)
Hemoglobin: 12.6 g/dL (ref 11.1–15.9)
MCH: 31.1 pg (ref 26.6–33.0)
MCHC: 33.5 g/dL (ref 31.5–35.7)
MCV: 93 fL (ref 79–97)
Platelets: 324 10*3/uL (ref 150–450)
RBC: 4.05 x10E6/uL (ref 3.77–5.28)
RDW: 11.4 % — ABNORMAL LOW (ref 11.7–15.4)
WBC: 7.1 10*3/uL (ref 3.4–10.8)

## 2021-05-18 LAB — HCG, SERUM, QUALITATIVE: hCG,Beta Subunit,Qual,Serum: NEGATIVE m[IU]/mL (ref <6)

## 2021-05-19 ENCOUNTER — Ambulatory Visit (INDEPENDENT_AMBULATORY_CARE_PROVIDER_SITE_OTHER): Payer: BC Managed Care – PPO | Admitting: Licensed Clinical Social Worker

## 2021-05-19 ENCOUNTER — Other Ambulatory Visit: Payer: Self-pay

## 2021-05-19 DIAGNOSIS — Z634 Disappearance and death of family member: Secondary | ICD-10-CM

## 2021-05-19 DIAGNOSIS — F3176 Bipolar disorder, in full remission, most recent episode depressed: Secondary | ICD-10-CM | POA: Diagnosis not present

## 2021-05-19 NOTE — Progress Notes (Signed)
Virtual Visit via Video Note ? ?I connected with Kathryn Munoz on 05/20/21 at  1:00 PM EST by a video enabled telemedicine application and verified that I am speaking with the correct person using two identifiers. ? ?Video connection was lost when less than 50% of the duration of the visit was complete, at which time the remainder of the visit was completed via audio only. ? ?Location: ?Patient: home ?Provider: [facility] ?  ?I discussed the limitations of evaluation and management by telemedicine and the availability of in person appointments. The patient expressed understanding and agreed to proceed. ?  ?I discussed the assessment and treatment plan with the patient. The patient was provided an opportunity to ask questions and all were answered. The patient agreed with the plan and demonstrated an understanding of the instructions. ?  ?The patient was advised to call back or seek an in-person evaluation if the symptoms worsen or if the condition fails to improve as anticipated. ? ?I provided 45 minutes of non-face-to-face time during this encounter. ? ? ?Kathryn Munoz R [place] Provencio, LCSW ? ? ?THERAPIST PROGRESS NOTE ? ?Session Time: 1-145p ? ?Participation Level: Active ? ?Behavioral Response: Neat and Well GroomedAlertAnxious and Depressed ? ?Type of Therapy: Individual Therapy ? ?Treatment Goals addressed: Problem: Bipolar Disorder CCP Problem  1 Alleviate depressive/manic symptoms and return to improved levels of effective functioning. ?Goal: LTG: Stabilize mood and increase goal-directed behavior: Input needed on appropriate metric ?Outcome: Progressing ?Goal: STG: [name] will attend at least 80% of scheduled follow-up medication management appointments ?Outcome: Progressing ? ?ProgressTowards Goals: Progressing ? ?Interventions: CBT and DBT ? ?Summary: Kathryn Munoz is a 46 y.o. female who presents with improving symptoms related to bipolar disorder. Pt reports that overall mood has been stable and that she is  managing situational stressors and generalized anxiety well. Pt reports that she is still experiencing some occasional insomnia/restless sleep.  ? ?Allowed pt to explore and express thoughts and feelings associated with recent life situations and external stressors. Discussed relationship with late mother. Pt feels she is doing a great job going through the grief process. Pt is continuing to bring flowers to her mother's grave and feels this helps her through the process. Explored feelings of guilt that pt is having about the past involving her behaviors and relationship with her mother prior to her death.   ? ?Pt was excited about recent surprise birthday party that her friends threw for her--had a dance instructor come because pt loves to dance. Allowed pt to explore the experience and positive feelings about self that the memory triggers. ? ?Continued recommendations are as follows: self care behaviors, positive social engagements, focusing on overall work/home/life balance, and focusing on positive physical and emotional wellness.  ? ? ?Suicidal/Homicidal: No ? ?Therapist Response: Pt is continuing to apply interventions learned in session into daily life situations. Pt is currently on track to meet goals utilizing interventions mentioned above. Personal growth and progress noted. Treatment to continue as indicated.  ? ?Plan: Return again in 4 weeks. ? ?Diagnosis: Bipolar disorder, in full remission, most recent episode depressed ([facility]) ? ?Bereavement ? ?Collaboration of Care: Other Pt encouraged to continue care with psychiatrist of record, Dr. 59 ? ?Patient/Guardian was advised Release of Information must be obtained prior to any record release in order to collaborate their care with an outside provider. Patient/Guardian was advised if they have not already done so to contact the registration department to sign all necessary forms in order for Kathryn Munoz to release information  regarding their care.  ? ?Consent:  Patient/Guardian gives verbal consent for treatment and assignment of benefits for services provided during this visit. Patient/Guardian expressed understanding and agreed to proceed.  ? ?Kathryn Munoz R [place] Edgett, LCSW ?05/20/2021 ? ?

## 2021-05-23 NOTE — Plan of Care (Signed)
?  Problem: Bipolar Disorder CCP Problem  1 Alleviate depressive/manic symptoms and return to improved levels of effective functioning. Goal: LTG: Stabilize mood and increase goal-directed behavior: Input needed on appropriate metric Outcome: Progressing Goal: STG: [name]@ will attend at least 80% of scheduled follow-up medication management appointments Outcome: Progressing   

## 2021-06-01 ENCOUNTER — Encounter (INDEPENDENT_AMBULATORY_CARE_PROVIDER_SITE_OTHER): Payer: Self-pay | Admitting: Adult Health

## 2021-06-01 ENCOUNTER — Other Ambulatory Visit: Payer: Self-pay

## 2021-06-01 ENCOUNTER — Ambulatory Visit (INDEPENDENT_AMBULATORY_CARE_PROVIDER_SITE_OTHER): Payer: BC Managed Care – PPO | Admitting: Adult Health

## 2021-06-01 ENCOUNTER — Ambulatory Visit (INDEPENDENT_AMBULATORY_CARE_PROVIDER_SITE_OTHER): Payer: BC Managed Care – PPO

## 2021-06-01 ENCOUNTER — Other Ambulatory Visit: Payer: Self-pay | Admitting: Obstetrics and Gynecology

## 2021-06-01 VITALS — BP 108/71 | HR 87 | Temp 98.3°F | Ht 65.0 in | Wt 234.0 lb

## 2021-06-01 DIAGNOSIS — N920 Excessive and frequent menstruation with regular cycle: Secondary | ICD-10-CM

## 2021-06-01 DIAGNOSIS — E8881 Metabolic syndrome: Secondary | ICD-10-CM | POA: Diagnosis not present

## 2021-06-01 DIAGNOSIS — E7849 Other hyperlipidemia: Secondary | ICD-10-CM | POA: Diagnosis not present

## 2021-06-01 DIAGNOSIS — K5903 Drug induced constipation: Secondary | ICD-10-CM | POA: Diagnosis not present

## 2021-06-01 DIAGNOSIS — E559 Vitamin D deficiency, unspecified: Secondary | ICD-10-CM

## 2021-06-01 DIAGNOSIS — N921 Excessive and frequent menstruation with irregular cycle: Secondary | ICD-10-CM | POA: Diagnosis not present

## 2021-06-01 DIAGNOSIS — Z9189 Other specified personal risk factors, not elsewhere classified: Secondary | ICD-10-CM

## 2021-06-01 MED ORDER — LINACLOTIDE 145 MCG PO CAPS: ORAL_CAPSULE | ORAL | 0 refills | Status: DC

## 2021-06-01 MED ORDER — VITAMIN D (ERGOCALCIFEROL) 1.25 MG (50000 UNIT) PO CAPS: 50000.0000 [IU] | ORAL_CAPSULE | ORAL | 0 refills | Status: DC

## 2021-06-01 MED ORDER — SAXENDA 18 MG/3ML ~~LOC~~ SOPN: 3.0000 mg | PEN_INJECTOR | Freq: Every day | SUBCUTANEOUS | 0 refills | Status: DC

## 2021-06-01 NOTE — Progress Notes (Signed)
? ? ? ?Chief Complaint:  ? ?OBESITY ?Kathryn Munoz is here to discuss her progress with her obesity treatment plan along with follow-up of her obesity related diagnoses. Kathryn Munoz is on the Category 3 Plan and states she is following her eating plan approximately 75% of the time. Kathryn Munoz states she is walking for 30-60 minutes 3 times per week. ? ?Today's visit was #: 30 ?Starting weight: 252 lbs ?Starting date: 07/16/2020 ?Today's weight: 234 lbs ?Today's date: 06/01/2021 ?Total lbs lost to date: 18 lbs ?Total lbs lost since last in-office visit: 1 lb ? ?Interim History:  ?Kathryn Munoz was briefly off Saxenda in January 2023 while infected with COVID-19. ?She restarted and titrated back up to Saxenda 3mg  QD- tolerating well. ?Her son has joined in with healthy eating - has helped increase plan compliance for her! ? ?Of note - NuvaRing for birth control. ? ?Subjective:  ? ?1. Insulin resistance ?On 02/02/2022, insulin level - 83.7 - well above normal. ?Kathryn Munoz was briefly off Saxenda in January 2023 while infected with COVID-19. ?She restarted and titrated back up to Saxenda 3mg  QD- tolerating well. ?She denies mass in neck, dysphagia, dyspepsia, persistent hoarseness, abd pain, or N/V/Constipation. ? ?2. Vitamin D deficiency ?On 02/02/2022, vitamin D level - 31.5. ?She is on ergocalciferol 50,000 IU once weekly- denies N/V/Muscle weakness. ? ?3. Drug-induced constipation ?Soft BMs, however, frequency has slowed to daily or every other day. ?She denies abd pain or hematochezia. ?She has not had a colonoscopy- she is 46 years old. ? ?4. Other hyperlipidemia ?05/04/20 Lipid panel- stable ?03/31/19 Lipid panel- LDL above goal- 110 ? ?5. At risk for diabetes mellitus ?Kathryn Munoz is at higher than average risk for developing diabetes due to significant IR.  ? ?Assessment/Plan:  ? ?1. Insulin resistance ?Check labs today. ?Refill Saxenda 3 mg daily. ? ?- Refill Liraglutide -Weight Management (SAXENDA) 18 MG/3ML SOPN; Inject 3 mg into the  skin daily.  Dispense: 15 mL; Refill: 0 ?- Comprehensive metabolic panel ?- Hemoglobin A1c ?- Insulin, random ?- Vitamin B12 ? ?2. Vitamin D deficiency ?Check vitamin D level today. ?Refill ergocalciferol 50,000 IU once weekly. ? ?- Refill Vitamin D, Ergocalciferol, (DRISDOL) 1.25 MG (50000 UNIT) CAPS capsule; Take 1 capsule (50,000 Units total) by mouth every 7 (seven) days.  Dispense: 4 capsule; Refill: 0 ?- VITAMIN D 25 Hydroxy (Vit-D Deficiency, Fractures) ? ?3. Drug-induced constipation ?Refill Linzess 145 mcg daily. ? ?- Refill linaclotide (LINZESS) 145 MCG CAPS capsule; TAKE 1 CAPSULE BY MOUTH EVERY DAY BEFORE BREAKFAST  Dispense: 90 capsule; Refill: 0 ? ?4. Other hyperlipidemia ?Check labs today. ? ?- Lipid panel ? ?5. At risk for diabetes mellitus ?Kathryn Munoz was given approximately 15 minutes of diabetic education and counseling today. We discussed intensive lifestyle modifications today with an emphasis on weight loss as well as increasing exercise and decreasing simple carbohydrates in her diet. We also reviewed medication options with an emphasis on risk versus benefits of those discussed. ? ?Repetitive spaced learning was employed today to elicit superior memory formation and behavioral change. ? ?6. Obesity with current BMI 38.9 ? ?Kathryn Munoz is currently in the action stage of change. As such, her goal is to continue with weight loss efforts. She has agreed to the Category 3 Plan.  ? ?Exercise goals:  As is. ? ?Behavioral modification strategies: increasing lean protein intake, decreasing simple carbohydrates, meal planning and cooking strategies, keeping healthy foods in the home, and planning for success. ? ?Kathryn Munoz has agreed to follow-up with our clinic in 6 weeks.  She was informed of the importance of frequent follow-up visits to maximize her success with intensive lifestyle modifications for her multiple health conditions.  ? ?Kathryn Munoz was informed we would discuss her lab results at her next visit  unless there is a critical issue that needs to be addressed sooner. Kathryn Munoz agreed to keep her next visit at the agreed upon time to discuss these results. ? ?Objective:  ? ?Blood pressure 108/71, pulse 87, temperature 98.3 ?F (36.8 ?C), height 5\' 5"  (1.651 m), weight 234 lb (106.1 kg), SpO2 97 %. ?Body mass index is 38.94 kg/m?. ? ?General: Cooperative, alert, well developed, in no acute distress. ?HEENT: Conjunctivae and lids unremarkable. ?Cardiovascular: Regular rhythm.  ?Lungs: Normal work of breathing. ?Neurologic: No focal deficits.  ? ?Lab Results  ?Component Value Date  ? CREATININE 0.97 02/02/2021  ? BUN 10 02/02/2021  ? NA 138 02/02/2021  ? K 4.8 02/02/2021  ? CL 101 02/02/2021  ? CO2 21 02/02/2021  ? ?Lab Results  ?Component Value Date  ? ALT 15 02/02/2021  ? AST 20 02/02/2021  ? ALKPHOS 63 02/02/2021  ? BILITOT 0.5 02/02/2021  ? ?Lab Results  ?Component Value Date  ? HGBA1C 5.1 02/02/2021  ? HGBA1C 5.3 08/05/2020  ? HGBA1C 5.0 05/04/2020  ? HGBA1C 5.2 01/07/2020  ? HGBA1C 5.2 10/14/2019  ? ?Lab Results  ?Component Value Date  ? INSULIN 83.7 (H) 02/02/2021  ? INSULIN 42.7 (H) 08/05/2020  ? INSULIN 46.7 (H) 05/04/2020  ? INSULIN 29.0 (H) 01/07/2020  ? INSULIN 32.5 (H) 10/14/2019  ? ?Lab Results  ?Component Value Date  ? TSH 3.800 05/04/2020  ? ?Lab Results  ?Component Value Date  ? CHOL 176 05/04/2020  ? HDL 61 05/04/2020  ? LDLCALC 93 05/04/2020  ? TRIG 123 05/04/2020  ? CHOLHDL 3.7 03/31/2019  ? ?Lab Results  ?Component Value Date  ? VD25OH 31.5 02/02/2021  ? VD25OH 49.6 08/05/2020  ? VD25OH 37.7 05/04/2020  ? ?Lab Results  ?Component Value Date  ? WBC 7.1 05/17/2021  ? HGB 12.6 05/17/2021  ? HCT 37.6 05/17/2021  ? MCV 93 05/17/2021  ? PLT 324 05/17/2021  ? ?Attestation Statements:  ? ?Reviewed by clinician on day of visit: allergies, medications, problem list, medical history, surgical history, family history, social history, and previous encounter notes. ? ?I, 05/19/2021, CMA, am acting as  Insurance claims handler for Energy manager, NP. ? ?I have reviewed the above documentation for accuracy and completeness, and I agree with the above. -  Delan Ksiazek d. Keoshia Steinmetz, NP-C ?

## 2021-06-02 LAB — COMPREHENSIVE METABOLIC PANEL
ALT: 11 IU/L (ref 0–32)
AST: 21 IU/L (ref 0–40)
Albumin/Globulin Ratio: 1.6 (ref 1.2–2.2)
Albumin: 4.5 g/dL (ref 3.8–4.8)
Alkaline Phosphatase: 55 IU/L (ref 44–121)
BUN/Creatinine Ratio: 10 (ref 9–23)
BUN: 9 mg/dL (ref 6–24)
Bilirubin Total: 0.6 mg/dL (ref 0.0–1.2)
CO2: 22 mmol/L (ref 20–29)
Calcium: 10.3 mg/dL — ABNORMAL HIGH (ref 8.7–10.2)
Chloride: 102 mmol/L (ref 96–106)
Creatinine, Ser: 0.91 mg/dL (ref 0.57–1.00)
Globulin, Total: 2.8 g/dL (ref 1.5–4.5)
Glucose: 99 mg/dL (ref 70–99)
Potassium: 4.7 mmol/L (ref 3.5–5.2)
Sodium: 141 mmol/L (ref 134–144)
Total Protein: 7.3 g/dL (ref 6.0–8.5)
eGFR: 80 mL/min/{1.73_m2} (ref >59)

## 2021-06-02 LAB — HEMOGLOBIN A1C
Est. average glucose Bld gHb Est-mCnc: 100 mg/dL
Hgb A1c MFr Bld: 5.1 % (ref 4.8–5.6)

## 2021-06-02 LAB — INSULIN, RANDOM: INSULIN: 36.2 u[IU]/mL — ABNORMAL HIGH (ref 2.6–24.9)

## 2021-06-02 LAB — LIPID PANEL
Chol/HDL Ratio: 3.2 ratio (ref 0.0–4.4)
Cholesterol, Total: 170 mg/dL (ref 100–199)
HDL: 53 mg/dL (ref >39)
LDL Chol Calc (NIH): 89 mg/dL (ref 0–99)
Triglycerides: 163 mg/dL — ABNORMAL HIGH (ref 0–149)
VLDL Cholesterol Cal: 28 mg/dL (ref 5–40)

## 2021-06-02 LAB — VITAMIN B12: Vitamin B-12: 474 pg/mL (ref 232–1245)

## 2021-06-02 LAB — VITAMIN D 25 HYDROXY (VIT D DEFICIENCY, FRACTURES): Vit D, 25-Hydroxy: 56.4 ng/mL (ref 30.0–100.0)

## 2021-06-06 ENCOUNTER — Encounter: Payer: Self-pay | Admitting: Obstetrics and Gynecology

## 2021-06-07 ENCOUNTER — Ambulatory Visit (INDEPENDENT_AMBULATORY_CARE_PROVIDER_SITE_OTHER): Payer: BC Managed Care – PPO | Admitting: Obstetrics and Gynecology

## 2021-06-07 ENCOUNTER — Other Ambulatory Visit: Payer: Self-pay

## 2021-06-07 ENCOUNTER — Encounter: Payer: Self-pay | Admitting: Obstetrics and Gynecology

## 2021-06-07 VITALS — BP 148/86 | HR 84 | Ht 65.0 in | Wt 242.6 lb

## 2021-06-07 DIAGNOSIS — N921 Excessive and frequent menstruation with irregular cycle: Secondary | ICD-10-CM | POA: Diagnosis not present

## 2021-06-07 DIAGNOSIS — N898 Other specified noninflammatory disorders of vagina: Secondary | ICD-10-CM | POA: Diagnosis not present

## 2021-06-07 DIAGNOSIS — N920 Excessive and frequent menstruation with regular cycle: Secondary | ICD-10-CM | POA: Diagnosis not present

## 2021-06-07 DIAGNOSIS — R350 Frequency of micturition: Secondary | ICD-10-CM | POA: Diagnosis not present

## 2021-06-07 LAB — POCT URINALYSIS DIPSTICK
Bilirubin, UA: NEGATIVE
Blood, UA: NEGATIVE
Glucose, UA: NEGATIVE
Ketones, UA: NEGATIVE
Leukocytes, UA: NEGATIVE
Nitrite, UA: NEGATIVE
Protein, UA: NEGATIVE
Spec Grav, UA: 1.015 (ref 1.010–1.025)
Urobilinogen, UA: 0.2 E.U./dL
pH, UA: 6 (ref 5.0–8.0)

## 2021-06-07 NOTE — Progress Notes (Signed)
Patient presents today for 3 week follow-up for heavy menstruation and ultrasound. She states shortly after her last visit she reinserted her nuvaring and stopped bleeding. She states vaginal itching, odor and urinary frequency. Patient states no other questions or concerns. ?

## 2021-06-07 NOTE — Progress Notes (Signed)
HPI: ?     [name] is a 46 y.o. [id number] who LMP was Patient's last menstrual period was 04/12/2021 (approximate). ? ?Subjective:  ? ?She presents today for follow-up of heavy bleeding with menses.  She reports that ever since she has begun using NuvaRing again her cycles are much better controlled.  She does complain of occasional odor and feeling the urge to urinate immediately after urinating. ?She reports a remote history of trichomonas. ?She has undergone an ultrasound for examination of the endometrium and uterus. ? ?  Hx: ?The following portions of the patient's history were reviewed and updated as appropriate: ?            She  has a past medical history of Abnormal mammogram, ADD (attention deficit disorder), Anxiety, Bipolar 2 disorder ([place]), Constipation, Depression, Dermatitis, Heavy menstrual bleeding, History of gestational diabetes, History of kidney stones (2014), Hypothyroidism, IFG (impaired fasting glucose), Insulin resistance, Irregular periods, Joint pain, Multiple food allergies, Neuropathy, Obesity, OCD (obsessive compulsive disorder), Prediabetes, and Vitamin D deficiency disease. ?She does not have any pertinent problems on file. ?She  has a past surgical history that includes Intrauterine device insertion (03/11/2012); Breast biopsy (Left, 2014); and Trigger finger release (Right). ?Her family history includes ADD / ADHD in her son; Alcohol abuse in her father and mother; Anxiety disorder in her mother and another family member; Breast cancer in her cousin; Depression in her mother; Diabetes in her brother, mother, and sister; Drug abuse in her brother; Eczema in her son; Heart disease in her mother; Hypertension in her mother; Kidney disease in her mother; Pancreatitis in her mother; Schizophrenia in her mother; Stroke in her mother; Thyroid disease in her mother. ?She  reports that she has never smoked. She has never used smokeless tobacco. She reports that she does not drink  alcohol and does not use drugs. ?She has a current medication list which includes the following prescription(s): vitamin d, citalopram, epinephrine, etonogestrel-ethinyl estradiol, ferrous sulfate, insulin pen needle, lamotrigine, lamotrigine, levothyroxine, linaclotide, saxenda, metformin, polyethylene glycol, trazodone, and vitamin d (ergocalciferol). ?She is allergic to date seed extract  [zizyphus jujuba] and other. ?      ?Review of Systems:  ?Review of Systems ? ?Constitutional: Denied constitutional symptoms, night sweats, recent illness, fatigue, fever, insomnia and weight loss.  ?Eyes: Denied eye symptoms, eye pain, photophobia, vision change and visual disturbance.  ?Ears/Nose/Throat/Neck: Denied ear, nose, throat or neck symptoms, hearing loss, nasal discharge, sinus congestion and sore throat.  ?Cardiovascular: Denied cardiovascular symptoms, arrhythmia, chest pain/pressure, edema, exercise intolerance, orthopnea and palpitations.  ?Respiratory: Denied pulmonary symptoms, asthma, pleuritic pain, productive sputum, cough, dyspnea and wheezing.  ?Gastrointestinal: Denied, gastro-esophageal reflux, melena, nausea and vomiting.  ?Genitourinary: Denied genitourinary symptoms including symptomatic vaginal discharge, pelvic relaxation issues, and urinary complaints.  ?Musculoskeletal: Denied musculoskeletal symptoms, stiffness, swelling, muscle weakness and myalgia.  ?Dermatologic: Denied dermatology symptoms, rash and scar.  ?Neurologic: Denied neurology symptoms, dizziness, headache, neck pain and syncope.  ?Psychiatric: Denied psychiatric symptoms, anxiety and depression.  ?Endocrine: Denied endocrine symptoms including hot flashes and night sweats.  ? ?Meds: ?  ?Current Outpatient Medications on File Prior to Visit  ?Medication Sig Dispense Refill  ? Cholecalciferol (VITAMIN D) 50 MCG (2000 UT) tablet Take 2,000 Units by mouth daily.    ? citalopram (CELEXA) 20 MG tablet Take 1 tablet (20 mg total) by mouth  daily. 90 tablet 1  ? EPINEPHrine 0.3 mg/0.3 mL IJ SOAJ injection INJECT 0.3 MLS INTO THE MUSCLE  ONCE FOR 1 DOSE. FOR LIFE-THREATENING ALLERGIC REACTION / ANAPHYLAXIS  1  ? etonogestrel-ethinyl estradiol (NUVARING) 0.12-0.015 MG/24HR vaginal ring Place 1 each vaginally every 28 (twenty-eight) days. Insert vaginally and leave in place for 3 consecutive weeks, then remove for 1 week. 3 each 3  ? Ferrous Sulfate (IRON PO) Take by mouth. Unknown dose    ? Insulin Pen Needle 32G X 6 MM MISC Will use with Saxenda daily 100 each 0  ? lamoTRIgine (LAMICTAL) 150 MG tablet Take 1 tablet (150 mg total) by mouth daily. Take along with 25 mg daily 90 tablet 1  ? lamoTRIgine (LAMICTAL) 25 MG tablet Take 1 tablet (25 mg total) by mouth daily. Take along with 150 mg daily 90 tablet 0  ? levothyroxine (SYNTHROID) 75 MCG tablet TAKE 1 TABLET BY MOUTH EVERY DAY BEFORE BREAKFAST 90 tablet 0  ? linaclotide (LINZESS) 145 MCG CAPS capsule TAKE 1 CAPSULE BY MOUTH EVERY DAY BEFORE BREAKFAST 90 capsule 0  ? Liraglutide -Weight Management (SAXENDA) 18 MG/3ML SOPN Inject 3 mg into the skin daily. 15 mL 0  ? metFORMIN (GLUCOPHAGE-XR) 500 MG 24 hr tablet TAKE 3 TABLETS (1,500 MG TOTAL) BY MOUTH DAILY. 270 tablet 3  ? polyethylene glycol (MIRALAX / GLYCOLAX) packet Take 17 g by mouth as needed.     ? traZODone (DESYREL) 50 MG tablet Take 1-2 tablets (50-100 mg total) by mouth at bedtime as needed for sleep. 180 tablet 0  ? Vitamin D, Ergocalciferol, (DRISDOL) 1.25 MG (50000 UNIT) CAPS capsule Take 1 capsule (50,000 Units total) by mouth every 7 (seven) days. 4 capsule 0  ? ?No current facility-administered medications on file prior to visit.  ? ? ? ? ?Objective:  ?  ? ?Vitals:  ? 06/07/21 1511  ?BP: (!) 148/86  ?Pulse: 84  ? ?Filed Weights  ? 06/07/21 1511  ?Weight: 242 lb 9.6 oz (110 kg)  ? ?  ?         Physical examination ?  Pelvic:   ?Vulva: Normal appearance.  No lesions.  ?Vagina: No lesions or abnormalities noted.  ?Support: Normal pelvic  support.  ?Urethra No masses tenderness or scarring.  ?Meatus Normal size without lesions or prolapse.  ?Cervix: Normal appearance.  No lesions.  ?Anus: Normal exam.  No lesions.  ?Perineum: Normal exam.  No lesions.  ?      Bimanual   ?Uterus: Normal size.  Non-tender.  Mobile.  AV.  ?Adnexae: No masses.  Non-tender to palpation.  ?Cul-de-sac: Negative for abnormality.  ? ?WET PREP: ?clue cells: absent, KOH (yeast): negative, odor: absent, and trichomoniasis: negative ?Ph:  < 4.5 ? ?        ? ?Assessment:  ?  ?[id number] ?Patient Active Problem List  ? Diagnosis Date Noted  ? Vertigo   ? Drug-induced constipation 05/24/2020  ? Other hyperlipidemia 05/24/2020  ? Insulin resistance 01/08/2020  ? Bipolar disorder, in full remission, most recent episode depressed ([place]) 12/23/2019  ? Heavy menstrual period 06/28/2019  ? 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 01/01/2019  ? Bipolar 2 disorder, major depressive episode ([place]) 09/05/2018  ? Insomnia due to mental condition 09/05/2018  ? Class 3 severe obesity with serious comorbidity and body mass index (BMI) of 40.0 to 44.9 in adult [facility]) 05/09/2018  ? Metabolic syndrome 0000000  ? Acanthosis nigricans 10/22/2017  ? Moderate episode of recurrent major depressive disorder ([place]) 10/22/2017  ? Acid reflux 04/01/2017  ? Anxiety 06/01/2016  ? Morbid obesity with BMI of  40.0-44.9, adult ([place]) 03/12/2015  ? Peripheral neuropathy 10/09/2014  ? Hypothyroidism   ? IFG (impaired fasting glucose)   ? Vitamin D deficiency   ? Neuropathy   ? ?  ?1. Menorrhagia with regular cycle   ?2. Prolonged menstrual cycle   ?3. Vaginal itching   ?4. Urinary frequency   ?5. Vaginal odor   ? ? No evidence of vaginitis by wet prep. ? Ultrasound is normal without a thickened endometrium ? ? ?Plan:  ?  ?       ? 1.  I have reassured the patient. ? 2.  Although her UA is negative today I will send her urine for culture just in case because of her symptoms of having to urinate immediately  after urinating and her urinary odor. ? ?Orders ?Orders Placed This Encounter  ?Procedures  ? Urine Culture  ? POCT urinalysis dipstick  ? ? No orders of the defined types were placed in this encounte

## 2021-06-10 ENCOUNTER — Other Ambulatory Visit: Payer: Self-pay | Admitting: Obstetrics and Gynecology

## 2021-06-10 ENCOUNTER — Encounter: Payer: Self-pay | Admitting: Obstetrics and Gynecology

## 2021-06-10 DIAGNOSIS — N3000 Acute cystitis without hematuria: Secondary | ICD-10-CM

## 2021-06-10 LAB — URINE CULTURE

## 2021-06-10 MED ORDER — SULFAMETHOXAZOLE-TRIMETHOPRIM 800-160 MG PO TABS
1.0000 | ORAL_TABLET | Freq: Two times a day (BID) | ORAL | 0 refills | Status: DC
Start: 2021-06-10 — End: 2021-10-24

## 2021-07-11 ENCOUNTER — Telehealth (INDEPENDENT_AMBULATORY_CARE_PROVIDER_SITE_OTHER): Payer: BC Managed Care – PPO | Admitting: Licensed Clinical Social Worker

## 2021-07-11 DIAGNOSIS — F3176 Bipolar disorder, in full remission, most recent episode depressed: Secondary | ICD-10-CM | POA: Diagnosis not present

## 2021-07-11 DIAGNOSIS — Z634 Disappearance and death of family member: Secondary | ICD-10-CM

## 2021-07-11 NOTE — Progress Notes (Signed)
Virtual Visit via Video Note ? ?I connected with Kathryn Munoz on 07/11/21 at  1:00 PM EDT by a video enabled telemedicine application and verified that I am speaking with the correct person using two identifiers. ? ?Video connection was lost when less than 50% of the duration of the visit was complete, at which time the remainder of the visit was completed via audio only. ? ?Location: ?Patient: home ?Provider: [facility] ?  ?I discussed the limitations of evaluation and management by telemedicine and the availability of in person appointments. The patient expressed understanding and agreed to proceed. ?  ?I discussed the assessment and treatment plan with the patient. The patient was provided an opportunity to ask questions and all were answered. The patient agreed with the plan and demonstrated an understanding of the instructions. ?  ?The patient was advised to call back or seek an in-person evaluation if the symptoms worsen or if the condition fails to improve as anticipated. ? ?I provided 45 minutes of non-face-to-face time during this encounter. ? ? ?Kathryn Munoz R Glorian Mcdonell, LCSW ? ? ?THERAPIST PROGRESS NOTE ? ?Session Time: 1-145p ? ?Participation Level: Active ? ?Behavioral Response: Neat and Well GroomedAlertAnxious and Depressed ? ?Type of Therapy: Individual Therapy ? ?Treatment Goals addressed: Problem: Bipolar Disorder CCP Problem  1 Alleviate depressive/manic symptoms and return to improved levels of effective functioning. ?Goal: LTG: Stabilize mood and increase goal-directed behavior: Input needed on appropriate metric ?Outcome: Progressing ?Goal: STG: [name] will attend at least 80% of scheduled follow-up medication management appointments ?Outcome: Progressing ? ?ProgressTowards Goals: Progressing ? ?Interventions: CBT and DBT ? ?Summary: Kathryn Munoz is a 46 y.o. female who presents with improving symptoms related to bipolar disorder. Pt reports that overall mood has been stable and that she is  managing situational stressors and generalized anxiety well. Pt reports that she is still experiencing some occasional insomnia/restless sleep.  ? ?Allowed pt to explore and express thoughts and feelings associated with recent life situations and external stressors. Patient reports that she is experiencing some work related stress at this time period patient reports that she found out that other colleagues were given a retention bonus, but patient feels that herself and fellow colleagues were left out of that retention bonus. Patient reports that she felt she was being vocal about it in front of her supervisor, and was proud that she was that voice and was being an advocate for herself. Patient reports that she does have an opportunity to speak with her supervisor one-on-one and is happy about that--patient also reports that she is currently seeking positions in other departments and seeking positions outside of her current employing organization. Patient reports that she is struggling a bit financially, so is continuing to work overtime. Explored patients grief process--patient reports that it was her late mother's birthday in April, and patient lost her mother in the month of March. Patient reports that she feels that she managed the situation well, and that her family was very supportive. Discussed overall Wellness and self-care--patient reports that she has been on a weight loss journey for a while and is feeling burned out. Patient wants to take a break from weight loss for a while. Encouraged patient to recognize what she has learned so far by being on this journey and not forgetting the things that she already knows, but to incorporate those things into her day-to-day world such as portion control, healthy choices, and drinking water. Patient reports that she is going to try to do all  these things on a daily basis. ? ?Continued recommendations are as follows: self care behaviors, positive social engagements,  focusing on overall work/home/life balance, and focusing on positive physical and emotional wellness.  ? ? ?Suicidal/Homicidal: No ? ?Therapist Response: Pt is continuing to apply interventions learned in session into daily life situations. Pt is currently on track to meet goals utilizing interventions mentioned above. Personal growth and progress noted. Treatment to continue as indicated.  ? ?Plan: Return again in 4 weeks. ? ?Diagnosis: No diagnosis found. ? ?Collaboration of Care: Other Pt encouraged to continue care with psychiatrist of record, Dr. Elna Breslow ? ?Patient/Guardian was advised Release of Information must be obtained prior to any record release in order to collaborate their care with an outside provider. Patient/Guardian was advised if they have not already done so to contact the registration department to sign all necessary forms in order for [place] to release information regarding their care.  ? ?Consent: Patient/Guardian gives verbal consent for treatment and assignment of benefits for services provided during this visit. Patient/Guardian expressed understanding and agreed to proceed.  ? ?Jeremih Dearmas R Meganne Rita, LCSW ?07/11/2021 ? ?

## 2021-07-11 NOTE — Plan of Care (Signed)
?  Problem: Bipolar Disorder CCP Problem  1 Alleviate depressive/manic symptoms and return to improved levels of effective functioning. Goal: LTG: Stabilize mood and increase goal-directed behavior: Input needed on appropriate metric Outcome: Progressing Goal: STG: [name]@ will attend at least 80% of scheduled follow-up medication management appointments Outcome: Progressing   

## 2021-07-13 ENCOUNTER — Ambulatory Visit (INDEPENDENT_AMBULATORY_CARE_PROVIDER_SITE_OTHER): Payer: BC Managed Care – PPO | Admitting: Adult Health

## 2021-07-13 ENCOUNTER — Encounter (INDEPENDENT_AMBULATORY_CARE_PROVIDER_SITE_OTHER): Payer: Self-pay | Admitting: Adult Health

## 2021-07-13 VITALS — BP 115/73 | HR 94 | Temp 98.2°F | Ht 65.0 in | Wt 236.0 lb

## 2021-07-13 DIAGNOSIS — E669 Obesity, unspecified: Secondary | ICD-10-CM

## 2021-07-13 DIAGNOSIS — E8881 Metabolic syndrome: Secondary | ICD-10-CM | POA: Diagnosis not present

## 2021-07-13 DIAGNOSIS — Z6839 Body mass index (BMI) 39.0-39.9, adult: Secondary | ICD-10-CM

## 2021-07-13 DIAGNOSIS — Z9189 Other specified personal risk factors, not elsewhere classified: Secondary | ICD-10-CM

## 2021-07-13 DIAGNOSIS — E559 Vitamin D deficiency, unspecified: Secondary | ICD-10-CM | POA: Diagnosis not present

## 2021-07-13 DIAGNOSIS — E7849 Other hyperlipidemia: Secondary | ICD-10-CM | POA: Diagnosis not present

## 2021-07-13 MED ORDER — WEGOVY 1 MG/0.5ML ~~LOC~~ SOAJ: 1.0000 mg | SUBCUTANEOUS | 0 refills | Status: DC

## 2021-07-13 MED ORDER — VITAMIN D (ERGOCALCIFEROL) 1.25 MG (50000 UNIT) PO CAPS: ORAL_CAPSULE | ORAL | 0 refills | Status: DC

## 2021-07-14 ENCOUNTER — Encounter (INDEPENDENT_AMBULATORY_CARE_PROVIDER_SITE_OTHER): Payer: Self-pay

## 2021-07-14 ENCOUNTER — Telehealth (INDEPENDENT_AMBULATORY_CARE_PROVIDER_SITE_OTHER): Payer: Self-pay | Admitting: Adult Health

## 2021-07-14 NOTE — Telephone Encounter (Signed)
Prior authorization approved for [facility] Effective: 07/13/2021 - 02/12/2022. Patient sent approval message via mychart.  ?

## 2021-07-22 NOTE — Progress Notes (Signed)
? ? ? ?Chief Complaint:  ? ?OBESITY ?Kathryn Munoz is here to discuss her progress with her obesity treatment plan along with follow-up of her obesity related diagnoses. Kathryn Munoz is on the Category 3 Plan and states she is following her eating plan approximately 0% of the time. Kathryn Munoz states she is doing 0 minutes 0 times per week. ? ?Today's visit was #: 31 ?Starting weight: 252 lbs ?Starting date: 07/16/2020 ?Today's weight: 236 lbs ?Today's date: 07/13/2021 ?Total lbs lost to date: 16 lbs ?Total lbs lost since last in-office visit: 0 ? ?Interim History:  ?Kathryn Munoz states I am at a "turning point".  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  ?She would like to stretch out office visits until life settles down.  ? ?Subjective:  ? ?1. Vitamin D deficiency ?Kathryn Munoz's last Vitamin D level on 06/01/2021 was 56.4 stable.  ? ?2. Insulin resistance ?06/01/21 labs-A1C 5.1 and Insulin level- 36.2 ?She is currently on Metformin XR 500 mg 3 tablets every day.  ?She is taking Saxenda 3 mg - denies mass in neck, dysphagia, dyspepsia, persistent hoarseness, abd Munoz, or N/V/Constipation. ? ?3. Other hyperlipidemia ?Kathryn Munoz's lipid panel on 06/01/2021 the only elevation was Triglycerides at 163. She is not on statin therapy.  ? ?4. At risk for diabetes mellitus ?Kathryn Munoz is at risk for diabetes mellitus due to insulin resistance and obesity.  ? ?Assessment/Plan:  ? ?1. Vitamin D deficiency ?Low Vitamin D level contributes to fatigue and are associated with obesity, breast, and colon cancer. We will refill prescription Vitamin D 50,000 IU every other week and [name] will follow-up for routine testing of Vitamin D, at least 2-3 times per year to avoid over-replacement. We discussed labs today.  ? ?- Vitamin D, Ergocalciferol, (DRISDOL) 1.25 MG (50000 UNIT) CAPS capsule; 1 cap every 14 days  Dispense: 6 capsule; Refill: 0 ? ?2. Insulin resistance ?Kathryn Munoz agrees to stop [place] and she agrees to start [employer]. We discussed labs today. She will  continue to work on weight loss, exercise, and decreasing simple carbohydrates to help decrease the risk of diabetes. Kathryn Munoz agreed to follow-up with [place] as directed to closely monitor her progress. ? ?- Semaglutide-Weight Management (WEGOVY) 1 MG/0.5ML SOAJ; Inject 1 mg into the skin once a week.  Dispense: 3 mL; Refill: 0 ? ?3. Other hyperlipidemia ?Cardiovascular risk and specific lipid/LDL goals reviewed.  Kathryn Munoz will decrease sugar and carbohydrates intake. We discussed labs today.  We discussed several lifestyle modifications today and Kathryn Munoz will continue to work on diet, exercise and weight loss efforts. Orders and follow up as documented in patient record.  ? ?Counseling ?Intensive lifestyle modifications are the first line treatment for this issue. ?Dietary changes: Increase soluble fiber. Decrease simple carbohydrates. ?Exercise changes: Moderate to vigorous-intensity aerobic activity 150 minutes per week if tolerated. ?Lipid-lowering medications: see documented in medical record. ? ?4. At risk for diabetes mellitus ?Good blood sugar control is important to decrease the likelihood of diabetic complications such as nephropathy, neuropathy, limb loss, blindness, coronary artery disease, and death. Intensive lifestyle modification including diet, exercise and weight loss are the first line of treatment for diabetes.  ? ?5. Obesity with current BMI 39.3 ?Kathryn Munoz is currently in the action stage of change. As such, her goal is to continue with weight loss efforts. She has agreed to the Category 3 Plan and practicing portion control and making smarter food choices, such as increasing vegetables and decreasing simple carbohydrates.  ? ?Kathryn Munoz agrees to start Wegovy 1 mg with no refills. If  denied, the she will fall back to Saxenda 3 mg.  ? ?Exercise goals:  Kathryn Munoz agrees to start walking 2-3 times per week.  ? ?Behavioral modification strategies: increasing lean protein intake, decreasing simple  carbohydrates, meal planning and cooking strategies, keeping healthy foods in the home, and planning for success. ? ?Kathryn Munoz has agreed to follow-up with our clinic in 12 weeks. She was informed of the importance of frequent follow-up visits to maximize her success with intensive lifestyle modifications for her multiple health conditions.  ? ?Objective:  ? ?Blood pressure 115/73, pulse 94, temperature 98.2 ?F (36.8 ?C), height 5\' 5"  (1.651 m), weight 236 lb (107 kg), SpO2 99 %. ?Body mass index is 39.27 kg/m?. ? ?General: Cooperative, alert, well developed, in no acute distress. ?HEENT: Conjunctivae and lids unremarkable. ?Cardiovascular: Regular rhythm.  ?Lungs: Normal work of breathing. ?Neurologic: No focal deficits.  ? ?Lab Results  ?Component Value Date  ? CREATININE 0.91 06/01/2021  ? BUN 9 06/01/2021  ? NA 141 06/01/2021  ? K 4.7 06/01/2021  ? CL 102 06/01/2021  ? CO2 22 06/01/2021  ? ?Lab Results  ?Component Value Date  ? ALT 11 06/01/2021  ? AST 21 06/01/2021  ? ALKPHOS 55 06/01/2021  ? BILITOT 0.6 06/01/2021  ? ?Lab Results  ?Component Value Date  ? HGBA1C 5.1 06/01/2021  ? HGBA1C 5.1 02/02/2021  ? HGBA1C 5.3 08/05/2020  ? HGBA1C 5.0 05/04/2020  ? HGBA1C 5.2 01/07/2020  ? ?Lab Results  ?Component Value Date  ? INSULIN 36.2 (H) 06/01/2021  ? INSULIN 83.7 (H) 02/02/2021  ? INSULIN 42.7 (H) 08/05/2020  ? INSULIN 46.7 (H) 05/04/2020  ? INSULIN 29.0 (H) 01/07/2020  ? ?Lab Results  ?Component Value Date  ? TSH 3.800 05/04/2020  ? ?Lab Results  ?Component Value Date  ? CHOL 170 06/01/2021  ? HDL 53 06/01/2021  ? LDLCALC 89 06/01/2021  ? TRIG 163 (H) 06/01/2021  ? CHOLHDL 3.2 06/01/2021  ? ?Lab Results  ?Component Value Date  ? VD25OH 56.4 06/01/2021  ? VD25OH 31.5 02/02/2021  ? VD25OH 49.6 08/05/2020  ? ?Lab Results  ?Component Value Date  ? WBC 7.1 05/17/2021  ? HGB 12.6 05/17/2021  ? HCT 37.6 05/17/2021  ? MCV 93 05/17/2021  ? PLT 324 05/17/2021  ? ?No results found for: IRON, TIBC, FERRITIN ? ?Attestation  Statements:  ? ?Reviewed by clinician on day of visit: allergies, medications, problem list, medical history, surgical history, family history, social history, and previous encounter notes. ? ?I, 05/19/2021, RMA, am acting as [place] [employer] for Energy manager, NP. ? ?I have reviewed the above documentation for accuracy and completeness, and I agree with the above. -  Kathryn Raigoza d. Jerlyn Pain, NP-C ?

## 2021-07-25 ENCOUNTER — Encounter (INDEPENDENT_AMBULATORY_CARE_PROVIDER_SITE_OTHER): Payer: Self-pay | Admitting: Adult Health

## 2021-07-26 ENCOUNTER — Other Ambulatory Visit (INDEPENDENT_AMBULATORY_CARE_PROVIDER_SITE_OTHER): Payer: Self-pay | Admitting: Adult Health

## 2021-07-26 ENCOUNTER — Encounter (INDEPENDENT_AMBULATORY_CARE_PROVIDER_SITE_OTHER): Payer: Self-pay | Admitting: Adult Health

## 2021-07-26 MED ORDER — WEGOVY 0.25 MG/0.5ML ~~LOC~~ SOAJ: 0.2500 mg | SUBCUTANEOUS | 0 refills | Status: DC

## 2021-07-26 NOTE — Telephone Encounter (Signed)
Please advise patient.  

## 2021-07-26 NOTE — Telephone Encounter (Signed)
Last OV with Katy 

## 2021-07-27 ENCOUNTER — Encounter: Payer: Self-pay | Admitting: Psychiatry

## 2021-07-27 ENCOUNTER — Telehealth (INDEPENDENT_AMBULATORY_CARE_PROVIDER_SITE_OTHER): Payer: BC Managed Care – PPO | Admitting: Psychiatry

## 2021-07-27 DIAGNOSIS — F429 Obsessive-compulsive disorder, unspecified: Secondary | ICD-10-CM | POA: Diagnosis not present

## 2021-07-27 DIAGNOSIS — F5105 Insomnia due to other mental disorder: Secondary | ICD-10-CM | POA: Diagnosis not present

## 2021-07-27 DIAGNOSIS — F3176 Bipolar disorder, in full remission, most recent episode depressed: Secondary | ICD-10-CM

## 2021-07-27 MED ORDER — LAMOTRIGINE 150 MG PO TABS: 150.0000 mg | ORAL_TABLET | Freq: Every day | ORAL | 1 refills | Status: DC

## 2021-07-27 MED ORDER — TRAZODONE HCL 50 MG PO TABS
50.0000 mg | ORAL_TABLET | Freq: Every evening | ORAL | 0 refills | Status: DC | PRN
Start: 2021-07-27 — End: 2021-10-24

## 2021-07-27 MED ORDER — HYDROXYZINE PAMOATE 25 MG PO CAPS: 25.0000 mg | ORAL_CAPSULE | Freq: Every evening | ORAL | 1 refills | Status: DC | PRN

## 2021-07-27 MED ORDER — CITALOPRAM HYDROBROMIDE 20 MG PO TABS: 20.0000 mg | ORAL_TABLET | Freq: Every day | ORAL | 1 refills | Status: DC

## 2021-07-27 MED ORDER — LAMOTRIGINE 25 MG PO TABS: 25.0000 mg | ORAL_TABLET | Freq: Every day | ORAL | 1 refills | Status: DC

## 2021-07-27 NOTE — Progress Notes (Signed)
Virtual Visit via Video Note ? ?I connected with Kathryn Munoz on 07/27/21 at  8:30 AM EDT by a video enabled telemedicine application and verified that I am speaking with the correct person using two identifiers. ? ?Location ?Provider Location : [facility] ?Patient Location : Car ? ?Participants: Patient , Provider ? ?  ?I discussed the limitations of evaluation and management by telemedicine and the availability of in person appointments. The patient expressed understanding and agreed to proceed. ?  ?I discussed the assessment and treatment plan with the patient. The patient was provided an opportunity to ask questions and all were answered. The patient agreed with the plan and demonstrated an understanding of the instructions. ?  ?The patient was advised to call back or seek an in-person evaluation if the symptoms worsen or if the condition fails to improve as anticipated. ? ?[facility] MD OP Progress Note ? ?07/27/2021 8:57 AM ?Kathryn Munoz  ?MRN:  [id number] ? ?Chief Complaint:  ?Chief Complaint  ?Patient presents with  ? Follow-up: 46 year old African-American female with history of bipolar disorder, insomnia, OCD was evaluated for medication management.  ? ?HPI: Kathryn Munoz is a 46 year old African-American female, lives in [place], has a history of bipolar disorder, insomnia, insulin resistance, hypothyroidism, other specified eating disorder was evaluated by telemedicine today. ? ?Patient today reports she is currently looking for new jobs since there was a recent change at her job.  That does make her anxious. ? ?Patient however reports she has been coping okay with her anxiety, currently following up with her therapist on a regular basis.  Therapy sessions are beneficial. ? ?Reports sleep as good.  Does take the trazodone which helps.  Has noticed bruxism, has had this before however likely recently triggered by her anxiety.  Does have some hydroxyzine available and would like to try it as needed. ? ?Patient is  compliant on her medications like Lamictal, Celexa.  Denies side effects. ? ?Denies any suicidality, homicidality or perceptual disturbances. ? ?Patient denies any other concerns today. ? ?Visit Diagnosis:  ?  ICD-10-CM   ?1. Bipolar disorder, in full remission, most recent episode depressed (HCC)  F31.76 lamoTRIgine (LAMICTAL) 25 MG tablet  ? type 2  ?  ?2. Insomnia due to mental condition  F51.05 traZODone (DESYREL) 50 MG tablet  ?  hydrOXYzine (VISTARIL) 25 MG capsule  ? mood  ?  ?3. 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  F42.9 lamoTRIgine (LAMICTAL) 150 MG tablet  ?  citalopram (CELEXA) 20 MG tablet  ?  hydrOXYzine (VISTARIL) 25 MG capsule  ?  ? ? ?Past Psychiatric History: Reviewed past psychiatric history from progress note on 11/08/2017.  Past trials of Celexa.  Patient with history of suicide attempts at the age of 84, overdosed on pills was admitted to the hospital.  Another episode at the age of 32, slept through it and did not tell anyone. ? ?Past Medical History:  ?Past Medical History:  ?Diagnosis Date  ? Abnormal mammogram   ? breast biopsy, [place] 2014  ? ADD (attention deficit disorder)   ? Anxiety   ? Bipolar 2 disorder ([place])   ? Constipation   ? Depression   ? Dermatitis   ? Heavy menstrual bleeding   ? History of gestational diabetes   ? History of kidney stones 2014  ? Hypothyroidism   ? IFG (impaired fasting glucose)   ? Insulin resistance   ? Irregular periods   ? Joint pain   ? Multiple food  allergies   ? Dates and nuts  ? Neuropathy   ? Obesity   ? OCD (obsessive compulsive disorder)   ? Prediabetes   ? Vitamin D deficiency disease   ?  ?Past Surgical History:  ?Procedure Laterality Date  ? BREAST BIOPSY Left 2014  ? [place] bx/clip-neg  ? INTRAUTERINE DEVICE INSERTION  03/11/2012  ? TRIGGER FINGER RELEASE Right   ? ? ?Family Psychiatric History: Reviewed family psychiatric history from progress note on 11/08/2017. ? ?Family History:  ?Family History  ?Problem Relation Age of Onset   ? Diabetes Mother   ? Pancreatitis Mother   ? Hypertension Mother   ? Alcohol abuse Mother   ? Schizophrenia Mother   ? Depression Mother   ? Heart disease Mother   ? Stroke Mother   ? Thyroid disease Mother   ? Kidney disease Mother   ? Anxiety disorder Mother   ? Alcohol abuse Father   ? Diabetes Sister   ?     pre-diabetic  ? Diabetes Brother   ? Drug abuse Brother   ? ADD / ADHD Son   ? Eczema Son   ? Breast cancer Cousin   ?     pat cousin  ? Anxiety disorder Other   ? Ovarian cancer Neg Hx   ? ? ?Social History: Reviewed social history from progress note on 11/08/2017. ?Social History  ? ?Socioeconomic History  ? Marital status: Married  ?  Spouse name: Andreia Eleazer  ? Number of children: 2  ? Years of education: Not on file  ? Highest education level: Bachelor's degree (e.g., BA, AB, BS)  ?Occupational History  ? Occupation: Chemist  ?Tobacco Use  ? Smoking status: Never  ? Smokeless tobacco: Never  ?Vaping Use  ? Vaping Use: Never used  ?Substance and Sexual Activity  ? Alcohol use: No  ? Drug use: No  ? Sexual activity: Yes  ?  Partners: Male  ?  Birth control/protection: Inserts  ?  Comment: nuvaring  ?Other Topics Concern  ? Not on file  ?Social History Narrative  ? Not on file  ? ?Social Determinants of Health  ? ?Financial Resource Strain: Low Risk   ? Difficulty of Paying Living Expenses: Not hard at all  ?Food Insecurity: No Food Insecurity  ? Worried About Charity fundraiser in the Last Year: Never true  ? Ran Out of Food in the Last Year: Never true  ?Transportation Needs: No Transportation Needs  ? Lack of Transportation (Medical): No  ? Lack of Transportation (Non-Medical): No  ?Physical Activity: Insufficiently Active  ? Days of Exercise per Week: 7 days  ? Minutes of Exercise per Session: 10 min  ?Stress: No Stress Concern Present  ? Feeling of Stress : Not at all  ?Social Connections: Moderately Integrated  ? Frequency of Communication with Friends and Family: Three times a week  ? Frequency  of Social Gatherings with Friends and Family: Three times a week  ? Attends Religious Services: 1 to 4 times per year  ? Active Member of Clubs or Organizations: No  ? Attends Archivist Meetings: Never  ? Marital Status: Married  ? ? ?Allergies:  ?Allergies  ?Allergen Reactions  ? Date Seed Extract  [Zizyphus Jujuba] Anaphylaxis, Cough, Itching and Swelling  ? Other Swelling  ? ? ?Metabolic Disorder Labs: ?Lab Results  ?Component Value Date  ? HGBA1C 5.1 06/01/2021  ? MPG 117 03/31/2019  ? MPG 91 05/09/2018  ? ?No results  found for: PROLACTIN ?Lab Results  ?Component Value Date  ? CHOL 170 06/01/2021  ? TRIG 163 (H) 06/01/2021  ? HDL 53 06/01/2021  ? CHOLHDL 3.2 06/01/2021  ? VLDL 14 10/16/2016  ? [place] 89 06/01/2021  ? [place] 93 05/04/2020  ? ?Lab Results  ?Component Value Date  ? TSH 3.800 05/04/2020  ? TSH 2.210 07/17/2019  ? ? ?Therapeutic Level Labs: ?No results found for: LITHIUM ?No results found for: VALPROATE ?No components found for:  CBMZ ? ?Current Medications: ?Current Outpatient Medications  ?Medication Sig Dispense Refill  ? Iron-Vitamin C (IRON 100/C) 100-250 MG TABS     ? Semaglutide-Weight Management (WEGOVY) 0.25 MG/0.5ML SOAJ Inject 0.25 mg into the skin once a week. 2 mL 0  ? citalopram (CELEXA) 20 MG tablet Take 1 tablet (20 mg total) by mouth daily. 90 tablet 1  ? EPINEPHrine 0.3 mg/0.3 mL IJ SOAJ injection INJECT 0.3 MLS INTO THE MUSCLE ONCE FOR 1 DOSE. FOR LIFE-THREATENING ALLERGIC REACTION / ANAPHYLAXIS  1  ? etonogestrel-ethinyl estradiol (NUVARING) 0.12-0.015 MG/24HR vaginal ring Place 1 each vaginally every 28 (twenty-eight) days. Insert vaginally and leave in place for 3 consecutive weeks, then remove for 1 week. 3 each 3  ? Ferrous Sulfate (IRON PO) Take by mouth. Unknown dose    ? hydrOXYzine (VISTARIL) 25 MG capsule Take 1-2 capsules (25-50 mg total) by mouth at bedtime as needed. 90 capsule 1  ? Insulin Pen Needle 32G X 6 MM MISC Will use with Saxenda daily 100 each 0   ? lamoTRIgine (LAMICTAL) 150 MG tablet Take 1 tablet (150 mg total) by mouth daily. Take along with 25 mg daily 90 tablet 1  ? lamoTRIgine (LAMICTAL) 25 MG tablet Take 1 tablet (25 mg total) by mouth dai

## 2021-08-11 ENCOUNTER — Other Ambulatory Visit (INDEPENDENT_AMBULATORY_CARE_PROVIDER_SITE_OTHER): Payer: Self-pay | Admitting: Family Medicine

## 2021-08-11 DIAGNOSIS — E038 Other specified hypothyroidism: Secondary | ICD-10-CM

## 2021-09-01 ENCOUNTER — Ambulatory Visit: Payer: BC Managed Care – PPO | Admitting: Licensed Clinical Social Worker

## 2021-09-22 ENCOUNTER — Other Ambulatory Visit (INDEPENDENT_AMBULATORY_CARE_PROVIDER_SITE_OTHER): Payer: Self-pay | Admitting: Adult Health

## 2021-09-22 DIAGNOSIS — K5903 Drug induced constipation: Secondary | ICD-10-CM

## 2021-10-01 ENCOUNTER — Other Ambulatory Visit (INDEPENDENT_AMBULATORY_CARE_PROVIDER_SITE_OTHER): Payer: Self-pay | Admitting: Adult Health

## 2021-10-01 DIAGNOSIS — E559 Vitamin D deficiency, unspecified: Secondary | ICD-10-CM

## 2021-10-11 ENCOUNTER — Ambulatory Visit (INDEPENDENT_AMBULATORY_CARE_PROVIDER_SITE_OTHER): Payer: BC Managed Care – PPO | Admitting: Adult Health

## 2021-10-11 ENCOUNTER — Encounter (INDEPENDENT_AMBULATORY_CARE_PROVIDER_SITE_OTHER): Payer: Self-pay | Admitting: Adult Health

## 2021-10-11 VITALS — BP 116/77 | HR 76 | Temp 98.6°F | Ht 65.0 in | Wt 240.0 lb

## 2021-10-11 DIAGNOSIS — K5903 Drug induced constipation: Secondary | ICD-10-CM

## 2021-10-11 DIAGNOSIS — Z6841 Body Mass Index (BMI) 40.0 and over, adult: Secondary | ICD-10-CM

## 2021-10-11 DIAGNOSIS — Z9189 Other specified personal risk factors, not elsewhere classified: Secondary | ICD-10-CM | POA: Insufficient documentation

## 2021-10-11 DIAGNOSIS — E66813 Obesity, class 3: Secondary | ICD-10-CM

## 2021-10-11 DIAGNOSIS — E8881 Metabolic syndrome: Secondary | ICD-10-CM | POA: Diagnosis not present

## 2021-10-11 DIAGNOSIS — E88819 Insulin resistance, unspecified: Secondary | ICD-10-CM

## 2021-10-11 DIAGNOSIS — E038 Other specified hypothyroidism: Secondary | ICD-10-CM | POA: Diagnosis not present

## 2021-10-11 DIAGNOSIS — E669 Obesity, unspecified: Secondary | ICD-10-CM

## 2021-10-11 DIAGNOSIS — E559 Vitamin D deficiency, unspecified: Secondary | ICD-10-CM

## 2021-10-11 DIAGNOSIS — E039 Hypothyroidism, unspecified: Secondary | ICD-10-CM

## 2021-10-11 MED ORDER — WEGOVY 0.5 MG/0.5ML ~~LOC~~ SOAJ: 0.5000 mg | SUBCUTANEOUS | 0 refills | Status: DC

## 2021-10-11 MED ORDER — METFORMIN HCL ER 500 MG PO TB24: 1500.0000 mg | ORAL_TABLET | Freq: Every day | ORAL | 3 refills | Status: DC

## 2021-10-11 MED ORDER — LINACLOTIDE 145 MCG PO CAPS: ORAL_CAPSULE | ORAL | 0 refills | Status: DC

## 2021-10-11 MED ORDER — LEVOTHYROXINE SODIUM 75 MCG PO TABS: ORAL_TABLET | ORAL | 0 refills | Status: DC

## 2021-10-12 LAB — COMPREHENSIVE METABOLIC PANEL
ALT: 12 IU/L (ref 0–32)
AST: 26 IU/L (ref 0–40)
Albumin/Globulin Ratio: 1.5 (ref 1.2–2.2)
Albumin: 4.4 g/dL (ref 3.9–4.9)
Alkaline Phosphatase: 61 IU/L (ref 44–121)
BUN/Creatinine Ratio: 14 (ref 9–23)
BUN: 12 mg/dL (ref 6–24)
Bilirubin Total: 0.5 mg/dL (ref 0.0–1.2)
CO2: 23 mmol/L (ref 20–29)
Calcium: 9.6 mg/dL (ref 8.7–10.2)
Chloride: 103 mmol/L (ref 96–106)
Creatinine, Ser: 0.88 mg/dL (ref 0.57–1.00)
Globulin, Total: 2.9 g/dL (ref 1.5–4.5)
Glucose: 97 mg/dL (ref 70–99)
Potassium: 4.6 mmol/L (ref 3.5–5.2)
Sodium: 141 mmol/L (ref 134–144)
Total Protein: 7.3 g/dL (ref 6.0–8.5)
eGFR: 83 mL/min/{1.73_m2} (ref >59)

## 2021-10-12 LAB — VITAMIN D 25 HYDROXY (VIT D DEFICIENCY, FRACTURES): Vit D, 25-Hydroxy: 48.4 ng/mL (ref 30.0–100.0)

## 2021-10-12 LAB — TSH+FREE T4
Free T4: 1.02 ng/dL (ref 0.82–1.77)
TSH: 3.32 u[IU]/mL (ref 0.450–4.500)

## 2021-10-12 LAB — VITAMIN B12: Vitamin B-12: 554 pg/mL (ref 232–1245)

## 2021-10-12 LAB — INSULIN, RANDOM: INSULIN: 30.4 u[IU]/mL — ABNORMAL HIGH (ref 2.6–24.9)

## 2021-10-12 LAB — HEMOGLOBIN A1C
Est. average glucose Bld gHb Est-mCnc: 105 mg/dL
Hgb A1c MFr Bld: 5.3 % (ref 4.8–5.6)

## 2021-10-12 NOTE — Progress Notes (Unsigned)
Chief Complaint:   OBESITY Kathryn Munoz is here to discuss her progress with her obesity treatment plan along with follow-up of her obesity related diagnoses. Kathryn Munoz is on {MWMwtlossportion/plan2:23431} and states she is following her eating plan approximately ***% of the time. Kathryn Munoz states she is *** *** minutes *** times per week.  Today's visit was #: *** Starting weight: *** Starting date: *** Today's weight: *** Today's date: 10/11/2021 Total lbs lost to date: *** Total lbs lost since last in-office visit: ***  Interim History: ***  Subjective:   1. Insulin resistance ***  2. Vitamin D deficiency ***  3. Acquired hypothyroidism ***  4. Drug-induced constipation ***  5. At risk for nausea ***  Assessment/Plan:   1. Insulin resistance *** - Comprehensive metabolic panel - Hemoglobin A1c - Insulin, random - Vitamin B12 - metFORMIN (GLUCOPHAGE-XR) 500 MG 24 hr tablet; Take 3 tablets (1,500 mg total) by mouth daily.  Dispense: 270 tablet; Refill: 3  2. Vitamin D deficiency *** - VITAMIN D 25 Hydroxy (Vit-D Deficiency, Fractures)  3. Acquired hypothyroidism *** - TSH + free T4 - levothyroxine (SYNTHROID) 75 MCG tablet; 1 tab each morning  Dispense: 90 tablet; Refill: 0  4. Drug-induced constipation *** - linaclotide (LINZESS) 145 MCG CAPS capsule; TAKE 1 CAPSULE BY MOUTH EVERY DAY BEFORE BREAKFAST  Dispense: 90 capsule; Refill: 0  5. At risk for nausea Kathryn Munoz was given approximately 15 minutes of nausea prevention counseling today. Kathryn Munoz is at risk for nausea due to her new or current medication. She was encouraged to titrate her medication slowly, make sure to stay hydrated, eat smaller portions throughout the day, and avoid high fat meals.   6. Obesity with current BMI 40.0 Kathryn Munoz {CHL AMB IS/IS NOT:[id number]} currently in the action stage of change. As such, her goal is to {MWMwtloss#1:210800005}. She has agreed to  {MWMwtlossportion/plan2:23431}.   We discussed various medication options to help Kathryn Munoz with her weight loss efforts and we both agreed to ***.  - Semaglutide-Weight Management (WEGOVY) 0.5 MG/0.5ML SOAJ; Inject 0.5 mg into the skin once a week.  Dispense: 2 mL; Refill: 0  Exercise goals: {MWM EXERCISE RECS:23473}  Behavioral modification strategies: {MWMwtlossdietstrategies3:23432}.  Trenna has agreed to follow-up with our clinic in {NUMBER 1-10:22536} weeks. She was informed of the importance of frequent follow-up visits to maximize her success with intensive lifestyle modifications for her multiple health conditions.   Salvador was informed we would discuss her lab results at her next visit unless there is a critical issue that needs to be addressed sooner. Laramie agreed to keep her next visit at the agreed upon time to discuss these results.  Objective:   Blood pressure 116/77, pulse 76, temperature 98.6 F (37 C), height 5\' 5"  (1.651 m), weight 240 lb (108.9 kg), SpO2 99 %. Body mass index is 39.94 kg/m.  General: Cooperative, alert, well developed, in no acute distress. HEENT: Conjunctivae and lids unremarkable. Cardiovascular: Regular rhythm.  Lungs: Normal work of breathing. Neurologic: No focal deficits.   Lab Results  Component Value Date   CREATININE 0.88 10/11/2021   BUN 12 10/11/2021   NA 141 10/11/2021   K 4.6 10/11/2021   CL 103 10/11/2021   CO2 23 10/11/2021   Lab Results  Component Value Date   ALT 12 10/11/2021   AST 26 10/11/2021   ALKPHOS 61 10/11/2021   BILITOT 0.5 10/11/2021   Lab Results  Component Value Date   HGBA1C 5.3 10/11/2021   HGBA1C  5.1 06/01/2021   HGBA1C 5.1 02/02/2021   HGBA1C 5.3 08/05/2020   HGBA1C 5.0 05/04/2020   Lab Results  Component Value Date   INSULIN 30.4 (H) 10/11/2021   INSULIN 36.2 (H) 06/01/2021   INSULIN 83.7 (H) 02/02/2021   INSULIN 42.7 (H) 08/05/2020   INSULIN 46.7 (H) 05/04/2020   Lab Results   Component Value Date   TSH 3.320 10/11/2021   Lab Results  Component Value Date   CHOL 170 06/01/2021   HDL 53 06/01/2021   LDLCALC 89 06/01/2021   TRIG 163 (H) 06/01/2021   CHOLHDL 3.2 06/01/2021   Lab Results  Component Value Date   VD25OH 48.4 10/11/2021   VD25OH 56.4 06/01/2021   VD25OH 31.5 02/02/2021   Lab Results  Component Value Date   WBC 7.1 05/17/2021   HGB 12.6 05/17/2021   HCT 37.6 05/17/2021   MCV 93 05/17/2021   PLT 324 05/17/2021   No results found for: "IRON", "TIBC", "FERRITIN"  Attestation Statements:   Reviewed by clinician on day of visit: allergies, medications, problem list, medical history, surgical history, family history, social history, and previous encounter notes.  Trude Mcburney, am acting as transcriptionist for [name], NP.  I have reviewed the above documentation for accuracy and completeness, and I agree with the above. -  ***

## 2021-10-24 ENCOUNTER — Encounter: Payer: Self-pay | Admitting: Psychiatry

## 2021-10-24 ENCOUNTER — Telehealth (INDEPENDENT_AMBULATORY_CARE_PROVIDER_SITE_OTHER): Payer: BC Managed Care – PPO | Admitting: Psychiatry

## 2021-10-24 DIAGNOSIS — F429 Obsessive-compulsive disorder, unspecified: Secondary | ICD-10-CM

## 2021-10-24 DIAGNOSIS — F5105 Insomnia due to other mental disorder: Secondary | ICD-10-CM | POA: Diagnosis not present

## 2021-10-24 DIAGNOSIS — F3181 Bipolar II disorder: Secondary | ICD-10-CM | POA: Diagnosis not present

## 2021-10-24 MED ORDER — LAMOTRIGINE 100 MG PO TABS: 100.0000 mg | ORAL_TABLET | Freq: Two times a day (BID) | ORAL | 1 refills | Status: DC

## 2021-10-24 MED ORDER — HYDROXYZINE PAMOATE 25 MG PO CAPS: 25.0000 mg | ORAL_CAPSULE | Freq: Every evening | ORAL | 1 refills | Status: DC | PRN

## 2021-10-24 MED ORDER — TRAZODONE HCL 50 MG PO TABS: 50.0000 mg | ORAL_TABLET | Freq: Every evening | ORAL | 0 refills | Status: DC | PRN

## 2021-10-24 NOTE — Progress Notes (Unsigned)
Virtual Visit via Video Note  I connected with Kathryn Munoz on 10/24/21 at  1:00 PM EDT by a video enabled telemedicine application and verified that I am speaking with the correct person using two identifiers.  Location Provider Location : [facility] Patient Location : Car  Participants: Patient , Provider   I discussed the limitations of evaluation and management by telemedicine and the availability of in person appointments. The patient expressed understanding and agreed to proceed.  I discussed the assessment and treatment plan with the patient. The patient was provided an opportunity to ask questions and all were answered. The patient agreed with the plan and demonstrated an understanding of the instructions.   The patient was advised to call back or seek an in-person evaluation if the symptoms worsen or if the condition fails to improve as anticipated.   BH MD OP Progress Note  10/24/2021 1:29 PM NATAHSA MARIAN  MRN:  [id number]  Chief Complaint:  Chief Complaint  Patient presents with   Follow-up: 46 year old African-American female with history of bipolar disorder, insomnia, OCD presented with worsening mood symptoms was evaluated for medication management.   HPI: INDRA WOLTERS is a 46 year old African-American female, lives in [place] has a history of bipolar disorder, insomnia, insulin resistance, hypothyroidism, other specified eating disorder was evaluated by telemedicine today.  Patient today reports she has been having worsening mood symptoms, sadness, low motivation, hopelessness, low energy, racing thoughts since the past 2 weeks.  Patient reports she is tired of her job and feels stuck.  She has been trying to apply for different job opportunities however has not been successful yet.  That does make her more anxious and sad and she has been coping by eating more.  She reports after doing so she worries about her weight and that itself is affecting her mood again.  Patient  reports sleep is restless, does have trazodone and hydroxyzine available which does help to some extent.  Patient denies any suicidality however reports she has had thoughts about not wanting to be here although she denies active suicidal thoughts or intent or plan.  Patient denies any homicidality or perceptual disturbances.  Patient is currently compliant on medications.  Denies side effects.  Reports she has scheduled an appointment with her therapist , looks forward to that.  She has not been compliant with therapy appointments for a while.  Denies any other concerns today.  Visit Diagnosis:    ICD-10-CM   1. Bipolar II disorder, mild, depressed, with anxious distress (HCC)  F31.81 lamoTRIgine (LAMICTAL) 100 MG tablet   Type 2    2. Insomnia due to mental condition  F51.05 hydrOXYzine (VISTARIL) 25 MG capsule    traZODone (DESYREL) 50 MG tablet   mood    3. 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  F42.9 hydrOXYzine (VISTARIL) 25 MG capsule      Past Psychiatric History: Reviewed past psychiatric history from progress note on 11/08/2017.  Patient with past trials of Celexa.  Past history of suicide at the age of 42 by overdose on pills.  Another suicide attempt at the age of 20-slept through it, did not tell anyone.  Past Medical History:  Past Medical History:  Diagnosis Date   Abnormal mammogram    breast biopsy, [facility] 2014   ADD (attention deficit disorder)    Anxiety    Bipolar 2 disorder (HCC)    Constipation    Depression    Dermatitis    Heavy menstrual bleeding  History of gestational diabetes    History of kidney stones 2014   Hypothyroidism    IFG (impaired fasting glucose)    Insulin resistance    Irregular periods    Joint pain    Multiple food allergies    Dates and nuts   Neuropathy    Obesity    OCD (obsessive compulsive disorder)    Prediabetes    Vitamin D deficiency disease     Past Surgical History:  Procedure Laterality  Date   BREAST BIOPSY Left 2014   [place] bx/clip-neg   INTRAUTERINE DEVICE INSERTION  03/11/2012   TRIGGER FINGER RELEASE Right     Family Psychiatric History: Reviewed family psychiatric history from progress note on 11/08/2017.  Family History:  Family History  Problem Relation Age of Onset   Diabetes Mother    Pancreatitis Mother    Hypertension Mother    Alcohol abuse Mother    Schizophrenia Mother    Depression Mother    Heart disease Mother    Stroke Mother    Thyroid disease Mother    Kidney disease Mother    Anxiety disorder Mother    Alcohol abuse Father    Diabetes Sister        pre-diabetic   Diabetes Brother    Drug abuse Brother    ADD / ADHD Son    Eczema Son    Breast cancer Cousin        pat cousin   Anxiety disorder Other    Ovarian cancer Neg Hx     Social History: Reviewed social history from progress note on 11/08/2017. Social History   Socioeconomic History   Marital status: Married    Spouse name: Ellizabeth Dacruz   Number of children: 2   Years of education: Not on file   Highest education level: Bachelor's degree (e.g., BA, AB, BS)  Occupational History   Occupation: Charity fundraiser  Tobacco Use   Smoking status: Never   Smokeless tobacco: Never  Vaping Use   Vaping Use: Never used  Substance and Sexual Activity   Alcohol use: No   Drug use: No   Sexual activity: Yes    Partners: Male    Birth control/protection: Inserts    Comment: nuvaring  Other Topics Concern   Not on file  Social History Narrative   Not on file   Social Determinants of Health   Financial Resource Strain: Low Risk  (11/17/2020)   Overall Financial Resource Strain (CARDIA)    Difficulty of Paying Living Expenses: Not hard at all  Food Insecurity: No Food Insecurity (11/17/2020)   Hunger Vital Sign    Worried About Running Out of Food in the Last Year: Never true    Ran Out of Food in the Last Year: Never true  Transportation Needs: No Transportation Needs (11/17/2020)    PRAPARE - Administrator, Civil Service (Medical): No    Lack of Transportation (Non-Medical): No  Physical Activity: Insufficiently Active (11/17/2020)   Exercise Vital Sign    Days of Exercise per Week: 7 days    Minutes of Exercise per Session: 10 min  Stress: No Stress Concern Present (11/17/2020)   [employer] of Occupational Health - Occupational Stress Questionnaire    Feeling of Stress : Not at all  Social Connections: Moderately Integrated (11/17/2020)   Social Connection and Isolation Panel [NHANES]    Frequency of Communication with Friends and Family: Three times a week    Frequency of Social  Gatherings with Friends and Family: Three times a week    Attends Religious Services: 1 to 4 times per year    Active Member of Clubs or Organizations: No    Attends BankerClub or Organization Meetings: Never    Marital Status: Married    Allergies:  Allergies  Allergen Reactions   Date Seed Extract  [Zizyphus Jujuba] Anaphylaxis, Cough, Itching and Swelling   Other Swelling    Metabolic Disorder Labs: Lab Results  Component Value Date   HGBA1C 5.3 10/11/2021   MPG 117 03/31/2019   MPG 91 05/09/2018   No results found for: "PROLACTIN" Lab Results  Component Value Date   CHOL 170 06/01/2021   TRIG 163 (H) 06/01/2021   HDL 53 06/01/2021   CHOLHDL 3.2 06/01/2021   VLDL 14 10/16/2016   LDLCALC 89 06/01/2021   LDLCALC 93 05/04/2020   Lab Results  Component Value Date   TSH 3.320 10/11/2021   TSH 3.800 05/04/2020    Therapeutic Level Labs: No results found for: "LITHIUM" No results found for: "VALPROATE" No results found for: "CBMZ"  Current Medications: Current Outpatient Medications  Medication Sig Dispense Refill   citalopram (CELEXA) 20 MG tablet Take 1 tablet (20 mg total) by mouth daily. 90 tablet 1   EPINEPHrine 0.3 mg/0.3 mL IJ SOAJ injection INJECT 0.3 MLS INTO THE MUSCLE ONCE FOR 1 DOSE. FOR LIFE-THREATENING ALLERGIC REACTION / ANAPHYLAXIS  1    etonogestrel-ethinyl estradiol (NUVARING) 0.12-0.015 MG/24HR vaginal ring Place 1 each vaginally every 28 (twenty-eight) days. Insert vaginally and leave in place for 3 consecutive weeks, then remove for 1 week. 3 each 3   Insulin Pen Needle 32G X 6 MM MISC Will use with Saxenda daily 100 each 0   Iron-Vitamin C (IRON 100/C) 100-250 MG TABS      lamoTRIgine (LAMICTAL) 100 MG tablet Take 1 tablet (100 mg total) by mouth 2 (two) times daily. 60 tablet 1   levothyroxine (SYNTHROID) 75 MCG tablet 1 tab each morning 90 tablet 0   linaclotide (LINZESS) 145 MCG CAPS capsule TAKE 1 CAPSULE BY MOUTH EVERY DAY BEFORE BREAKFAST 90 capsule 0   Liraglutide -Weight Management (SAXENDA [place]) Inject into the skin.     metFORMIN (GLUCOPHAGE-XR) 500 MG 24 hr tablet Take 3 tablets (1,500 mg total) by mouth daily. 270 tablet 3   Vitamin D, Ergocalciferol, (DRISDOL) 1.25 MG (50000 UNIT) CAPS capsule 1 cap every 14 days 6 capsule 0   Ferrous Sulfate (IRON PO) Take by mouth. Unknown dose (Patient not taking: Reported on 10/24/2021)     hydrOXYzine (VISTARIL) 25 MG capsule Take 1-3 capsules (25-75 mg total) by mouth at bedtime as needed. Sleep and anxiety 90 capsule 1   Semaglutide-Weight Management (WEGOVY) 0.5 MG/0.5ML SOAJ Inject 0.5 mg into the skin once a week. (Patient not taking: Reported on 10/24/2021) 2 mL 0   traZODone (DESYREL) 50 MG tablet Take 1-2 tablets (50-100 mg total) by mouth at bedtime as needed for sleep. 180 tablet 0   No current facility-administered medications for this visit.     Musculoskeletal: Strength & Muscle Tone:  UTA Gait & Station:  Seated Patient leans: N/A  Psychiatric Specialty Exam: Review of Systems  Psychiatric/Behavioral:  Positive for dysphoric mood and sleep disturbance. The patient is nervous/anxious.   All other systems reviewed and are negative.   There were no vitals taken for this visit.There is no height or weight on file to calculate BMI.  General Appearance: Casual   Eye Contact:  Fair  Speech:  Clear and Coherent  Volume:  Normal  Mood:  Anxious and Depressed  Affect:  Congruent  Thought Process:  Goal Directed and Descriptions of Associations: Intact  Orientation:  Full (Time, Place, and Person)  Thought Content: Logical   Suicidal Thoughts:  No  Homicidal Thoughts:  No  Memory:  Immediate;   Fair Recent;   Fair Remote;   Fair  Judgement:  Fair  Insight:  Fair  Psychomotor Activity:  Normal  Concentration:  Concentration: Fair and Attention Span: Fair  Recall:  [name] of Knowledge: Fair  Language: Fair  Akathisia:  No  Handed:  Right  AIMS (if indicated): done  Assets:  Communication Skills Desire for Improvement Housing Intimacy Social Support Talents/Skills  ADL's:  Intact  Cognition: WNL  Sleep:   restless   Screenings: AIMS    Flowsheet Row Video Visit from 10/24/2021 in [facility] Video Visit from 07/27/2021 in [facility] Video Visit from 10/28/2020 in [facility]  AIMS Total Score 0 0 0      GAD-7    Flowsheet Row Video Visit from 07/27/2021 in [facility] Office Visit from 05/17/2021 in [facility] Video Visit from 10[facility]28/2020 in [facility] Office Visit from 05/14/2020 in [facility]  Total GAD-7 Score 2 1 0 4      PHQ2-9    Flowsheet Row Video Visit from 10/24/2021 in [facility] Video Visit from 07/27/2021 in [facility] Office Visit from 05/17/2021 in [facility] Office Visit from 05/04/2021 in [facility] Counselor from 12/31/2020 in [facility]  PHQ-2 Total Score 5 0 0 2 0  PHQ-9 Total Score 17 -- 6 7 --      Flowsheet Row Video Visit from 10/24/2021 in [facility] Video Visit from 07/27/2021  in [facility] Counselor from 05/19/2021 in [facility]  C-SSRS RISK CATEGORY Low Risk Low Risk Low Risk        Assessment and Plan: MACKINZE CRIADO is a 46 year old African-American female, married, employed, lives in [place], has a history of bipolar disorder, sleep problem, OCD, hypothyroidism was evaluated by telemedicine today.  Patient with depression, anxiety and sleep problems, will benefit from the following plan.  Plan Bipolar type II depressed with anxious distress-unstable Increase lamotrigine to 200 mg p.o. daily in divided dosage. Celexa 20 mg p.o. daily-dose reduced due to sexual dysfunction. Patient encouraged to have more frequent psychotherapy sessions, patient has been noncompliant.  OCD-stable Continue CBT with Ms. Christina Hussami Celexa 20 mg p.o. daily.  Insomnia-unstable Increase hydroxyzine to 25-75 mg p.o. nightly as needed Trazodone 50 mg at bedtime.  Follow-up in clinic in 3 to 4 weeks or sooner if needed.  Collaboration of Care: Collaboration of Care: Referral or follow-up with counselor/therapist AEB encouraged to follow up with therapist.  Patient/Guardian was advised Release of Information must be obtained prior to any record release in order to collaborate their care with an outside provider. Patient/Guardian was advised if they have not already done so to contact the registration department to sign all necessary forms in order for [place] to release information regarding their care.   Consent: Patient/Guardian gives verbal consent for treatment and assignment of benefits for services provided during this visit. Patient/Guardian expressed understanding and agreed to proceed.   This note was generated in part or whole  with voice recognition software. Voice recognition is usually quite accurate but there are transcription errors that can and very often do occur. I apologize for any typographical errors that  were not detected and corrected.      Jomarie Longs, MD 10/25/2021, 9:28 AM

## 2021-10-26 ENCOUNTER — Encounter (INDEPENDENT_AMBULATORY_CARE_PROVIDER_SITE_OTHER): Payer: Self-pay

## 2021-11-16 ENCOUNTER — Ambulatory Visit (INDEPENDENT_AMBULATORY_CARE_PROVIDER_SITE_OTHER): Payer: BC Managed Care – PPO | Admitting: Adult Health

## 2021-11-16 ENCOUNTER — Encounter (INDEPENDENT_AMBULATORY_CARE_PROVIDER_SITE_OTHER): Payer: Self-pay | Admitting: Adult Health

## 2021-11-16 VITALS — BP 110/76 | HR 77 | Temp 98.6°F | Ht 65.0 in | Wt 244.0 lb

## 2021-11-16 DIAGNOSIS — E8881 Metabolic syndrome: Secondary | ICD-10-CM | POA: Diagnosis not present

## 2021-11-16 DIAGNOSIS — E669 Obesity, unspecified: Secondary | ICD-10-CM

## 2021-11-16 DIAGNOSIS — Z6841 Body Mass Index (BMI) 40.0 and over, adult: Secondary | ICD-10-CM | POA: Diagnosis not present

## 2021-11-16 DIAGNOSIS — E559 Vitamin D deficiency, unspecified: Secondary | ICD-10-CM | POA: Diagnosis not present

## 2021-11-16 MED ORDER — VITAMIN D (ERGOCALCIFEROL) 1.25 MG (50000 UNIT) PO CAPS: 50000.0000 [IU] | ORAL_CAPSULE | ORAL | 0 refills | Status: DC

## 2021-11-16 MED ORDER — WEGOVY 0.25 MG/0.5ML ~~LOC~~ SOAJ: 0.2500 mg | SUBCUTANEOUS | 0 refills | Status: DC

## 2021-11-17 ENCOUNTER — Telehealth (INDEPENDENT_AMBULATORY_CARE_PROVIDER_SITE_OTHER): Payer: BC Managed Care – PPO | Admitting: Psychiatry

## 2021-11-17 ENCOUNTER — Encounter: Payer: Self-pay | Admitting: Psychiatry

## 2021-11-17 DIAGNOSIS — F3181 Bipolar II disorder: Secondary | ICD-10-CM

## 2021-11-17 DIAGNOSIS — F429 Obsessive-compulsive disorder, unspecified: Secondary | ICD-10-CM

## 2021-11-17 DIAGNOSIS — F5105 Insomnia due to other mental disorder: Secondary | ICD-10-CM

## 2021-11-17 MED ORDER — LAMOTRIGINE 25 MG PO TABS: 25.0000 mg | ORAL_TABLET | Freq: Every day | ORAL | 0 refills | Status: DC

## 2021-11-17 NOTE — Progress Notes (Signed)
Virtual Visit via Video Note  I connected with Kathryn Munoz on 11/17/21 at  4:20 PM EDT by a video enabled telemedicine application and verified that I am speaking with the correct person using two identifiers.  Location Provider Location : [facility] Patient Location : Car  Participants: Patient , Provider    I discussed the limitations of evaluation and management by telemedicine and the availability of in person appointments. The patient expressed understanding and agreed to proceed.  I discussed the assessment and treatment plan with the patient. The patient was provided an opportunity to ask questions and all were answered. The patient agreed with the plan and demonstrated an understanding of the instructions.   The patient was advised to call back or seek an in-person evaluation if the symptoms worsen or if the condition fails to improve as anticipated.   BH MD OP Progress Note  11/18/2021 1:14 PM LILANA BLASKO  MRN:  [id number]  Chief Complaint:  Chief Complaint  Patient presents with   Follow-up: 46 year old African-American female with history of bipolar disorder, insomnia, OCD, was evaluated for medication management.   HPI: Kathryn Munoz is a 46 year old African-American female, lives in [place], has a history of bipolar disorder, insomnia, insulin resistance, hypothyroidism, other specified eating disorder was evaluated by telemedicine today.  Patient today reports she did not increase the lamotrigine dosage as recommended last visit.  She reports she started sleeping better and that in turn helped her mood to get better.  She stayed on the hydroxyzine 25 mg at bedtime as needed and did not increase that dose either.  Patient reports she was worried about her job and recently had a new job interview, which looks positive and she is going to move on to the next step of being offered that job.  Just that change helped her to feel better.  When she looks back she believes she may  have been worried about this job which could have caused her to have worsening mood and sleep problems.  Patient otherwise denies any significant concerns at this time.  Denies suicidality, homicidality or perceptual disturbances.  Denies side effects to medications.  Patient denies any other concerns today.  Visit Diagnosis:    ICD-10-CM   1. Bipolar II disorder in full remission (HCC)  F31.81 lamoTRIgine (LAMICTAL) 25 MG tablet   most recent depressed     2. Insomnia due to mental condition  F51.05    mood    3. 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  F42.9       Past Psychiatric History: Reviewed past psychiatric history from progress note on 11/08/2017.  Patient with past trials of Celexa.  Past history of suicide at the age of 8 by overdose on pills.  Another suicide attempt at the age of 20-slept through it, did not tell anyone.  Past Medical History:  Past Medical History:  Diagnosis Date   Abnormal mammogram    breast biopsy, [facility] 2014   ADD (attention deficit disorder)    Anxiety    Bipolar 2 disorder (HCC)    Constipation    Depression    Dermatitis    Heavy menstrual bleeding    History of gestational diabetes    History of kidney stones 2014   Hypothyroidism    IFG (impaired fasting glucose)    Insulin resistance    Irregular periods    Joint pain    Multiple food allergies    Dates and nuts  Neuropathy    Obesity    OCD (obsessive compulsive disorder)    Prediabetes    Vitamin D deficiency disease     Past Surgical History:  Procedure Laterality Date   BREAST BIOPSY Left 2014   [place] bx/clip-neg   INTRAUTERINE DEVICE INSERTION  03/11/2012   TRIGGER FINGER RELEASE Right     Family Psychiatric History: Reviewed family psychiatric history from progress note on 11/08/2017.  Family History:  Family History  Problem Relation Age of Onset   Diabetes Mother    Pancreatitis Mother    Hypertension Mother    Alcohol abuse Mother     Schizophrenia Mother    Depression Mother    Heart disease Mother    Stroke Mother    Thyroid disease Mother    Kidney disease Mother    Anxiety disorder Mother    Alcohol abuse Father    Diabetes Sister        pre-diabetic   Diabetes Brother    Drug abuse Brother    ADD / ADHD Son    Eczema Son    Breast cancer Cousin        pat cousin   Anxiety disorder Other    Ovarian cancer Neg Hx     Social History: Reviewed social history from progress note on 11/08/2017. Social History   Socioeconomic History   Marital status: Married    Spouse name: Mallisa Alameda   Number of children: 2   Years of education: Not on file   Highest education level: Bachelor's degree (e.g., BA, AB, BS)  Occupational History   Occupation: Charity fundraiser  Tobacco Use   Smoking status: Never   Smokeless tobacco: Never  Vaping Use   Vaping Use: Never used  Substance and Sexual Activity   Alcohol use: No   Drug use: No   Sexual activity: Yes    Partners: Male    Birth control/protection: Inserts    Comment: nuvaring  Other Topics Concern   Not on file  Social History Narrative   Not on file   Social Determinants of Health   Financial Resource Strain: Low Risk  (11/17/2020)   Overall Financial Resource Strain (CARDIA)    Difficulty of Paying Living Expenses: Not hard at all  Food Insecurity: No Food Insecurity (11/17/2020)   Hunger Vital Sign    Worried About Running Out of Food in the Last Year: Never true    Ran Out of Food in the Last Year: Never true  Transportation Needs: No Transportation Needs (11/17/2020)   PRAPARE - Administrator, Civil Service (Medical): No    Lack of Transportation (Non-Medical): No  Physical Activity: Insufficiently Active (11/17/2020)   Exercise Vital Sign    Days of Exercise per Week: 7 days    Minutes of Exercise per Session: 10 min  Stress: No Stress Concern Present (11/17/2020)   [employer] of Occupational Health - Occupational Stress  Questionnaire    Feeling of Stress : Not at all  Social Connections: Moderately Integrated (11/17/2020)   Social Connection and Isolation Panel [NHANES]    Frequency of Communication with Friends and Family: Three times a week    Frequency of Social Gatherings with Friends and Family: Three times a week    Attends Religious Services: 1 to 4 times per year    Active Member of Clubs or Organizations: No    Attends Banker Meetings: Never    Marital Status: Married    Allergies:  Allergies  Allergen Reactions   Date Seed Extract  [Zizyphus Jujuba] Anaphylaxis, Cough, Itching and Swelling   Other Swelling    Metabolic Disorder Labs: Lab Results  Component Value Date   HGBA1C 5.3 10/11/2021   MPG 117 03/31/2019   MPG 91 05/09/2018   No results found for: "PROLACTIN" Lab Results  Component Value Date   CHOL 170 06/01/2021   TRIG 163 (H) 06/01/2021   HDL 53 06/01/2021   CHOLHDL 3.2 06/01/2021   VLDL 14 10/16/2016   LDLCALC 89 06/01/2021   LDLCALC 93 05/04/2020   Lab Results  Component Value Date   TSH 3.320 10/11/2021   TSH 3.800 05/04/2020    Therapeutic Level Labs: No results found for: "LITHIUM" No results found for: "VALPROATE" No results found for: "CBMZ"  Current Medications: Current Outpatient Medications  Medication Sig Dispense Refill   citalopram (CELEXA) 20 MG tablet Take 1 tablet (20 mg total) by mouth daily. 90 tablet 1   EPINEPHrine 0.3 mg/0.3 mL IJ SOAJ injection INJECT 0.3 MLS INTO THE MUSCLE ONCE FOR 1 DOSE. FOR LIFE-THREATENING ALLERGIC REACTION / ANAPHYLAXIS  1   etonogestrel-ethinyl estradiol (NUVARING) 0.12-0.015 MG/24HR vaginal ring Place 1 each vaginally every 28 (twenty-eight) days. Insert vaginally and leave in place for 3 consecutive weeks, then remove for 1 week. 3 each 3   Ferrous Sulfate (IRON PO) Take by mouth. Unknown dose     hydrOXYzine (VISTARIL) 25 MG capsule Take 1-3 capsules (25-75 mg total) by mouth at bedtime as  needed. Sleep and anxiety 90 capsule 1   Insulin Pen Needle 32G X 6 MM MISC Will use with Saxenda daily 100 each 0   Iron-Vitamin C (IRON 100/C) 100-250 MG TABS      lamoTRIgine (LAMICTAL) 150 MG tablet Take 150 mg by mouth daily.     lamoTRIgine (LAMICTAL) 25 MG tablet Take 1 tablet (25 mg total) by mouth daily. Take along with 150 mg - total of 175 mg 90 tablet 0   levothyroxine (SYNTHROID) 75 MCG tablet 1 tab each morning 90 tablet 0   linaclotide (LINZESS) 145 MCG CAPS capsule TAKE 1 CAPSULE BY MOUTH EVERY DAY BEFORE BREAKFAST 90 capsule 0   metFORMIN (GLUCOPHAGE-XR) 500 MG 24 hr tablet Take 3 tablets (1,500 mg total) by mouth daily. 270 tablet 3   traZODone (DESYREL) 50 MG tablet Take 1-2 tablets (50-100 mg total) by mouth at bedtime as needed for sleep. 180 tablet 0   Vitamin D, Ergocalciferol, (DRISDOL) 1.25 MG (50000 UNIT) CAPS capsule Take 1 capsule (50,000 Units total) by mouth every 7 (seven) days. 4 capsule 0   Semaglutide-Weight Management (WEGOVY) 0.25 MG/0.5ML SOAJ Inject 0.25 mg into the skin once a week. (Patient not taking: Reported on 11/17/2021) 2 mL 0   No current facility-administered medications for this visit.     Musculoskeletal: Strength & Muscle Tone:  UTA Gait & Station:  Seated Patient leans: N/A  Psychiatric Specialty Exam: Review of Systems  Psychiatric/Behavioral: Negative.    All other systems reviewed and are negative.   There were no vitals taken for this visit.There is no height or weight on file to calculate BMI.  General Appearance: Casual  Eye Contact:  Fair  Speech:  Clear and Coherent  Volume:  Normal  Mood:  Euthymic  Affect:  Congruent  Thought Process:  Goal Directed and Descriptions of Associations: Intact  Orientation:  Full (Time, Place, and Person)  Thought Content: Logical   Suicidal Thoughts:  No  Homicidal Thoughts:  No  Memory:  Immediate;   Fair Recent;   Fair Remote;   Fair  Judgement:  Fair  Insight:  Fair  Psychomotor  Activity:  Normal  Concentration:  Concentration: Fair and Attention Span: Good  Recall:  Good  Fund of Knowledge: Fair  Language: Fair  Akathisia:  No  Handed:  Right  AIMS (if indicated): not done  Assets:  Communication Skills Desire for Improvement Housing Intimacy Social Support Talents/Skills Transportation  ADL's:  Intact  Cognition: WNL  Sleep:  Fair   Screenings: AIMS    Flowsheet Row Video Visit from 10/24/2021 in [facility] Video Visit from 07/27/2021 in [facility] Video Visit from 10/28/2020 in [facility]  AIMS Total Score 0 0 0      GAD-7    Flowsheet Row Video Visit from 11/17/2021 in [facility] Video Visit from 07/27/2021 in [facility] Office Visit from 05/17/2021 in [facility] Video Visit from 10/28/2020 in [facility] Office Visit from 05/14/2020 in [facility]  Total GAD-7 Score 3 2 1  0 4      PHQ2-9    Flowsheet Row Video Visit from 11/17/2021 in [facility] Video Visit from 10/24/2021 in [facility] Video Visit from 07/27/2021 in [facility] Office Visit from 05/17/2021 in [facility] Office Visit from 05/04/2021 in [facility]  PHQ-2 Total Score 0 5 0 0 2  PHQ-9 Total Score -- 17 -- 6 7      Flowsheet Row Video Visit from 11/17/2021 in [facility] Video Visit from 10/24/2021 in [facility] Video Visit from 07/27/2021 in [facility]  C-SSRS RISK CATEGORY Low Risk Low Risk Low Risk        Assessment and Plan: SILVANA HOLECEK is a 46 year old African-American female, married, employed, lives in [place], has a history of bipolar disorder, sleep problem, OCD,  hypothyroidism was evaluated by telemedicine today.  Patient is currently stable, will benefit from the following plan.  Plan Bipolar disorder type II depressed with anxious distress in remission Reduce lamotrigine to 175 mg p.o. daily.  Discontinue lamotrigine 200 mg p.o. daily for noncompliance. Celexa 20 mg p.o. daily-dose reduced due to sexual dysfunction. Continue CBT  OCD-stable Continue CBT with Ms. Christina Hussami Celexa 20 mg p.o. daily  Insomnia-improving Hydroxyzine 25 to 75 mg p.o. nightly as needed Trazodone 50 mg at bedtime  Follow-up in clinic in 3 months or sooner if needed.   Collaboration of Care: Collaboration of Care: Referral or follow-up with counselor/therapist AEB encouraged to follow up with therapist.  Patient/Guardian was advised Release of Information must be obtained prior to any record release in order to collaborate their care with an outside provider. Patient/Guardian was advised if they have not already done so to contact the registration department to sign all necessary forms in order for [place] [facility] to release information regarding their care.   Consent: Patient/Guardian gives verbal consent for treatment and assignment of benefits for services provided during this visit. Patient/Guardian expressed understanding and agreed to proceed.  This note was generated in part or whole with voice recognition software. Voice recognition is usually quite accurate but there are transcription errors that can and very often do occur. I apologize for any typographical errors that were not detected and corrected.       [place], MD 11/18/2021, 1:14 PM
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
## 2021-11-22 ENCOUNTER — Ambulatory Visit (INDEPENDENT_AMBULATORY_CARE_PROVIDER_SITE_OTHER): Payer: BC Managed Care – PPO | Admitting: Family Medicine

## 2021-11-22 ENCOUNTER — Encounter: Payer: Self-pay | Admitting: Family Medicine

## 2021-11-22 VITALS — BP 118/74 | HR 88 | Temp 98.4°F | Resp 16 | Ht 65.0 in | Wt 250.5 lb

## 2021-11-22 DIAGNOSIS — Z760 Encounter for issue of repeat prescription: Secondary | ICD-10-CM

## 2021-11-22 DIAGNOSIS — Z1231 Encounter for screening mammogram for malignant neoplasm of breast: Secondary | ICD-10-CM

## 2021-11-22 DIAGNOSIS — Z0000 Encounter for general adult medical examination without abnormal findings: Secondary | ICD-10-CM

## 2021-11-22 DIAGNOSIS — E039 Hypothyroidism, unspecified: Secondary | ICD-10-CM

## 2021-11-22 DIAGNOSIS — Z6841 Body Mass Index (BMI) 40.0 and over, adult: Secondary | ICD-10-CM

## 2021-11-22 DIAGNOSIS — E661 Drug-induced obesity: Secondary | ICD-10-CM

## 2021-11-22 DIAGNOSIS — H9190 Unspecified hearing loss, unspecified ear: Secondary | ICD-10-CM

## 2021-11-22 DIAGNOSIS — K5903 Drug induced constipation: Secondary | ICD-10-CM

## 2021-11-22 DIAGNOSIS — E8881 Metabolic syndrome: Secondary | ICD-10-CM | POA: Diagnosis not present

## 2021-11-22 DIAGNOSIS — Z23 Encounter for immunization: Secondary | ICD-10-CM

## 2021-11-22 MED ORDER — LINACLOTIDE 145 MCG PO CAPS: ORAL_CAPSULE | ORAL | 3 refills | Status: DC

## 2021-11-22 MED ORDER — LEVOTHYROXINE SODIUM 88 MCG PO TABS: 88.0000 ug | ORAL_TABLET | Freq: Every day | ORAL | 0 refills | Status: DC

## 2021-11-22 MED ORDER — NYSTATIN 100000 UNIT/GM EX POWD: 1.0000 | Freq: Three times a day (TID) | CUTANEOUS | 0 refills | Status: DC

## 2021-11-22 NOTE — Progress Notes (Signed)
Patient: Kathryn Munoz, Female    DOB: 25-Jul-1975, 46 y.o.   MRN: [id number] Delsa Grana, PA-C Visit Date: 11/22/2021  Today's Provider: Delsa Grana, PA-C   Chief Complaint  Patient presents with   Annual Exam   Subjective:   Annual physical exam:  Kathryn Munoz is a 46 y.o. female who presents today for complete physical exam:  Exercise/Activity:  not exercising  Diet/nutrition:   list of foods from specialist Sleep:  sleeps ok - trazodone - trying it with psychiatry    [place]: No Food Insecurity (11/22/2021)  Housing: Low Risk  (11/22/2021)  Transportation Needs: No Transportation Needs (11/22/2021)  Utilities: Not At Risk (11/22/2021)  Alcohol Screen: Low Risk  (11/22/2021)  Depression (PHQ2-9): Low Risk  (11/22/2021)  Recent Concern: Depression (PHQ2-9) - High Risk (10/24/2021)  Financial Resource Strain: Low Risk  (11/22/2021)  Physical Activity: Inactive (11/22/2021)  Social Connections: Socially Integrated (11/22/2021)  Stress: Stress Concern Present (11/22/2021)  Tobacco Use: Low Risk  (11/22/2021)    Pt wished to discuss weight and referral - bariatric [facility] .Baxter Flattery NP   Hearing - repeat themselves - she wants eval/screening for this  Taking over med refills for the medical weight management specialists Hypothyroid - has been managed by specialist Lab Results  Component Value Date   TSH 3.320 10/11/2021   Reviewed weight/bmi  Wt Readings from Last 5 Encounters:  11/22/21 250 lb 8 oz (113.6 kg)  11/16/21 244 lb (110.7 kg)  10/11/21 240 lb (108.9 kg)  07/13/21 236 lb (107 kg)  06/07/21 242 lb 9.6 oz (110 kg)   BMI Readings from Last 5 Encounters:  11/22/21 41.69 kg/m  11/16/21 40.60 kg/m  10/11/21 39.94 kg/m  07/13/21 39.27 kg/m  06/07/21 40.37 kg/m     Constipation well controlled with linzess 145 mcg daily been on for a year and a half  USPSTF grade A and B recommendations - reviewed and addressed today  Depression:  Phq 9  completed today by patient, was reviewed by me with patient in the room PHQ score is neg, pt feels good    11/22/2021    8:16 AM 11/17/2021    4:28 PM 10/24/2021    1:04 PM 07/27/2021    8:37 AM  PHQ 2/9 Scores  PHQ - 2 Score 0     PHQ- 9 Score 0        Information is confidential and restricted. Go to Review Flowsheets to unlock data.      11/22/2021    8:16 AM 11/17/2021    4:28 PM 10/24/2021    1:04 PM 07/27/2021    8:37 AM 05/17/2021    4:03 PM  Depression screen PHQ 2/9  Decreased Interest 0    0  Down, Depressed, Hopeless 0    0  PHQ - 2 Score 0    0  Altered sleeping 0    1  Tired, decreased energy 0    1  Change in appetite 0    1  Feeling bad or failure about yourself  0    0  Trouble concentrating 0    2  Moving slowly or fidgety/restless 0    1  Suicidal thoughts 0    0  PHQ-9 Score 0    6  Difficult doing work/chores Not difficult at all    Not difficult at all     Information is confidential and restricted. Go to Review Flowsheets to unlock data.  Alcohol screening: [place] Office Visit from [facility]/22/[facility]  AUDIT-C Score 0       Immunizations and Health Maintenance: Health Maintenance  Topic Date Due   COVID-19 Vaccine (4 - Pfizer series) 05/04/2020   MAMMOGRAM  02/14/2022   PAP SMEAR-Modifier  11/18/2023   TETANUS/TDAP  10/10/2025   INFLUENZA VACCINE  Completed   Hepatitis C Screening  Completed   HIV Screening  Completed   HPV VACCINES  Aged Out     Hep C Screening: done int he past  STD testing and prevention (HIV/chl/gon/syphilis):  see above, no additional testing desired by pt today  Intimate partner violence:    Sexual History/Pain during Intercourse: Married  Menstrual History/LMP/Abnormal Bleeding: nuvaring Patient's last menstrual period was 09/11/2021 (approximate).  Incontinence Symptoms:  none  Breast cancer: due in a few montjhs Last Mammogram: *see HM list above BRCA gene screening:    Cervical cancer screening: done with [facility] Pt  family hx of cancers - breast, ovarian, uterine, colon:     Osteoporosis:   Discussion on osteoporosis per age, including high calcium and vitamin D supplementation, weight bearing exercises Pt is doing high dose  vit D - will resume OTC dose when completed  Skin cancer:  Hx of skin CA -  NO Discussed atypical lesions  Rash in skin fold  Colorectal cancer:  constipation, no blood Colonoscopy is coming up when she turns 89 in Feb  Discussed concerning signs and sx of CRC, pt denies blood in stool abd pain/bowel changes  Lung cancer:   Low Dose CT Chest recommended if Age 3-80 years, 20 pack-year currently smoking OR have quit w/in 15years. Patient does not qualify.    Social History   Tobacco Use   Smoking status: Never   Smokeless tobacco: Never  Vaping Use   Vaping Use: Never used  Substance Use Topics   Alcohol use: Yes    Comment: Ocassionally   Drug use: No     Flowsheet Row Office Visit from 11/22/2021 in [facility]  AUDIT-C Score 0       Family History  Problem Relation Age of Onset   Diabetes Mother    Pancreatitis Mother    Hypertension Mother    Alcohol abuse Mother    Schizophrenia Mother    Depression Mother    Heart disease Mother    Stroke Mother    Thyroid disease Mother    Kidney disease Mother    Anxiety disorder Mother    Alcohol abuse Father    Diabetes Sister        pre-diabetic   Diabetes Brother    Drug abuse Brother    ADD / ADHD Son    Eczema Son    Breast cancer Cousin        pat cousin   Anxiety disorder Other    Ovarian cancer Neg Hx      Blood pressure/Hypertension: BP Readings from Last 3 Encounters:  11/22/21 118/74  11/16/21 110/76  10/11/21 116/77    Weight/Obesity: Wt Readings from Last 3 Encounters:  11/22/21 250 lb 8 oz (113.6 kg)  11/16/21 244 lb (110.7 kg)  10/11/21 240 lb (108.9 kg)   BMI Readings from Last 3 Encounters:  11/22/21  41.69 kg/m  11/16/21 40.60 kg/m  10/11/21 39.94 kg/m     Lipids:  Lab Results  Component Value Date   CHOL 170 06/01/2021   CHOL 176 05/04/2020   CHOL 186 10/14/2019  Lab Results  Component Value Date   HDL 53 06/01/2021   HDL 61 05/04/2020   HDL 55 10/14/2019   Lab Results  Component Value Date   LDLCALC 89 06/01/2021   LDLCALC 93 05/04/2020   LDLCALC 113 (H) 10/14/2019   Lab Results  Component Value Date   TRIG 163 (H) 06/01/2021   TRIG 123 05/04/2020   TRIG 102 10/14/2019   Lab Results  Component Value Date   CHOLHDL 3.2 06/01/2021   CHOLHDL 3.7 03/31/2019   CHOLHDL 3.1 11/08/2018   No results found for: "LDLDIRECT" Based on the results of lipid panel his/her cardiovascular risk factor ( using [place] )  in the next 10 years is: The 10-year ASCVD risk score (Arnett DK, et al., 2019) is: 0.6%   Values used to calculate the score:     Age: 45 years     Sex: Female     Is Non-Hispanic African American: Yes     Diabetic: No     Tobacco smoker: No     Systolic Blood Pressure: 335 mmHg     Is BP treated: No     HDL Cholesterol: 53 mg/dL     Total Cholesterol: 170 mg/dL  Glucose:  Glucose  Date Value Ref Range Status  10/11/2021 97 70 - 99 mg/dL Final  06/01/2021 99 70 - 99 mg/dL Final  02/02/2021 116 (H) 70 - 99 mg/dL Final   Glucose, Bld  Date Value Ref Range Status  03/31/2019 159 (H) 65 - 139 mg/dL Final    Comment:    .        Non-fasting reference interval .   11/08/2018 85 65 - 99 mg/dL Final    Comment:    .            Fasting reference interval .   05/09/2018 111 (H) 65 - 99 mg/dL Final    Comment:    .            Fasting reference interval . For someone without known diabetes, a glucose value between 100 and 125 mg/dL is consistent with prediabetes and should be confirmed with a follow-up test. .     Advanced Care Planning:  A voluntary discussion about advance care planning including the explanation and discussion of  advance directives.   Discussed health care proxy and Living will, and the patient was able to identify a health care proxy as husband, Dellis Filbert.   Patient does not have a living will at present time.   Social History       Social History   Socioeconomic History   Marital status: Married    Spouse name: Kristalyn Bergstresser   Number of children: 2   Years of education: Not on file   Highest education level: Bachelor's degree (e.g., BA, AB, BS)  Occupational History   Occupation: English as a second language teacher  Tobacco Use   Smoking status: Never   Smokeless tobacco: Never  Vaping Use   Vaping Use: Never used  Substance and Sexual Activity   Alcohol use: Yes    Comment: Ocassionally   Drug use: No   Sexual activity: Yes    Partners: Male    Birth control/protection: Inserts    Comment: nuvaring  Other Topics Concern   Not on file  Social History Narrative   Not on file   Social Determinants of Health   Financial Resource Strain: Low Risk  (11/22/2021)   Overall Financial Resource Strain (CARDIA)    Difficulty  of Paying Living Expenses: Not hard at all  Food Insecurity: No Food Insecurity (11/22/2021)   Hunger Vital Sign    Worried About Running Out of Food in the Last Year: Never true    Ran Out of Food in the Last Year: Never true  Transportation Needs: No Transportation Needs (11/22/2021)   PRAPARE - Hydrologist (Medical): No    Lack of Transportation (Non-Medical): No  Physical Activity: Inactive (11/22/2021)   Exercise Vital Sign    Days of Exercise per Week: 0 days    Minutes of Exercise per Session: 0 min  Stress: Stress Concern Present (11/22/2021)   [place]    Feeling of Stress : To some extent  Social Connections: Socially Integrated (11/22/2021)   Social Connection and Isolation Panel [NHANES]    Frequency of Communication with Friends and Family: More than three times a week    Frequency of  Social Gatherings with Friends and Family: More than three times a week    Attends Religious Services: 1 to 4 times per year    Active Member of [employer] or Organizations: Yes    Attends Archivist Meetings: 1 to 4 times per year    Marital Status: Married    Family History        Family History  Problem Relation Age of Onset   Diabetes Mother    Pancreatitis Mother    Hypertension Mother    Alcohol abuse Mother    Schizophrenia Mother    Depression Mother    Heart disease Mother    Stroke Mother    Thyroid disease Mother    Kidney disease Mother    Anxiety disorder Mother    Alcohol abuse Father    Diabetes Sister        pre-diabetic   Diabetes Brother    Drug abuse Brother    ADD / ADHD Son    Eczema Son    Breast cancer Cousin        pat cousin   Anxiety disorder Other    Ovarian cancer Neg Hx     Patient Active Problem List   Diagnosis Date Noted   At risk for nausea 10/11/2021   Prolonged menstruation 04/12/2021   Vertigo    Drug-induced constipation 05/24/2020   Other hyperlipidemia 05/24/2020   Insulin resistance 01/08/2020   Bipolar disorder, in full remission, most recent episode depressed ([place]) 12/23/2019   Heavy menstrual period 06/28/2019   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 01/01/2019   Bipolar II disorder in full remission ([place]) 09/05/2018   Insomnia due to mental condition 09/05/2018   Class 3 severe obesity with serious comorbidity and body mass index (BMI) of 40.0 to 44.9 in adult ([place]) 03/00/9233   Metabolic syndrome 00/76/2263   Acanthosis nigricans 10/22/2017   Moderate episode of recurrent major depressive disorder ([place]) 10/22/2017   Acid reflux 04/01/2017   Anxiety 06/01/2016   Morbid obesity with BMI of 40.0-44.9, adult ([place]) 03/12/2015   Peripheral neuropathy 10/09/2014   Hypothyroidism    IFG (impaired fasting glucose)    Vitamin D deficiency    Neuropathy     Past Surgical History:  Procedure  Laterality Date   BREAST BIOPSY Left 2014   [place] bx/clip-neg   INTRAUTERINE DEVICE INSERTION  03/11/2012   TRIGGER FINGER RELEASE Right 04/21/2021     Current Outpatient Medications:    citalopram (CELEXA) 20 MG tablet,  Take 1 tablet (20 mg total) by mouth daily., Disp: 90 tablet, Rfl: 1   EPINEPHrine 0.3 mg/0.3 mL IJ SOAJ injection, INJECT 0.3 MLS INTO THE MUSCLE ONCE FOR 1 DOSE. FOR LIFE-THREATENING ALLERGIC REACTION / ANAPHYLAXIS, Disp: , Rfl: 1   etonogestrel-ethinyl estradiol (NUVARING) 0.12-0.015 MG/24HR vaginal ring, Place 1 each vaginally every 28 (twenty-eight) days. Insert vaginally and leave in place for 3 consecutive weeks, then remove for 1 week., Disp: 3 each, Rfl: 3   Ferrous Sulfate (IRON PO), Take by mouth. Unknown dose, Disp: , Rfl:    hydrOXYzine (VISTARIL) 25 MG capsule, Take 1-3 capsules (25-75 mg total) by mouth at bedtime as needed. Sleep and anxiety, Disp: 90 capsule, Rfl: 1   lamoTRIgine (LAMICTAL) 150 MG tablet, Take 150 mg by mouth daily., Disp: , Rfl:    lamoTRIgine (LAMICTAL) 25 MG tablet, Take 1 tablet (25 mg total) by mouth daily. Take along with 150 mg - total of 175 mg, Disp: 90 tablet, Rfl: 0   levothyroxine (SYNTHROID) 75 MCG tablet, 1 tab each morning, Disp: 90 tablet, Rfl: 0   linaclotide (LINZESS) 145 MCG CAPS capsule, TAKE 1 CAPSULE BY MOUTH EVERY DAY BEFORE BREAKFAST, Disp: 90 capsule, Rfl: 0   metFORMIN (GLUCOPHAGE-XR) 500 MG 24 hr tablet, Take 3 tablets (1,500 mg total) by mouth daily., Disp: 270 tablet, Rfl: 3   Semaglutide-Weight Management (WEGOVY) 0.25 MG/0.5ML SOAJ, Inject 0.25 mg into the skin once a week., Disp: 2 mL, Rfl: 0   traZODone (DESYREL) 50 MG tablet, Take 1-2 tablets (50-100 mg total) by mouth at bedtime as needed for sleep., Disp: 180 tablet, Rfl: 0   Vitamin D, Ergocalciferol, (DRISDOL) 1.25 MG (50000 UNIT) CAPS capsule, Take 1 capsule (50,000 Units total) by mouth every 7 (seven) days., Disp: 4 capsule, Rfl: 0   Insulin Pen Needle 32G  X 6 MM MISC, Will use with Saxenda daily (Patient not taking: Reported on 11/22/2021), Disp: 100 each, Rfl: 0   Iron-Vitamin C (IRON 100/C) 100-250 MG TABS, , Disp: , Rfl:   Allergies  Allergen Reactions   Date Seed Extract  [Zizyphus Jujuba] Anaphylaxis, Cough, Itching and Swelling   Other Swelling    Patient Care Team: Delsa Grana, PA-C as PCP - General (Family Medicine) Tiajuana Amass, MD as Referring Physician (Allergy and Immunology)   Chart Review: .I personally reviewed active problem list, medication list, allergies, family history, social history, health maintenance, notes from last encounter, lab results, imaging with the patient/caregiver today.   Review of Systems  Constitutional: Negative.   HENT: Negative.    Eyes: Negative.   Respiratory: Negative.    Cardiovascular: Negative.   Gastrointestinal: Negative.   Endocrine: Negative.   Genitourinary: Negative.   Musculoskeletal: Negative.   Skin: Negative.   Allergic/Immunologic: Negative.   Neurological: Negative.   Hematological: Negative.   Psychiatric/Behavioral: Negative.    All other systems reviewed and are negative.         Objective:   Vitals:  Vitals:   11/22/21 0824  BP: 118/74  Pulse: 88  Resp: 16  Temp: 98.4 F (36.9 C)  TempSrc: Oral  SpO2: 98%  Weight: 250 lb 8 oz (113.6 kg)  Height: 5' 5"  (1.651 m)    Body mass index is 41.69 kg/m.  Physical Exam Vitals and nursing note reviewed.  Constitutional:      General: She is not in acute distress.    Appearance: Normal appearance. She is well-developed. She is not ill-appearing, toxic-appearing or diaphoretic.  Interventions: Face mask in place.  HENT:     Head: Normocephalic and atraumatic.     Right Ear: External ear normal.     Left Ear: External ear normal.  Eyes:     General: Lids are normal. No scleral icterus.       Right eye: No discharge.        Left eye: No discharge.     Conjunctiva/sclera: Conjunctivae normal.  Neck:      Trachea: Phonation normal. No tracheal deviation.  Cardiovascular:     Rate and Rhythm: Normal rate and regular rhythm.     Pulses: Normal pulses.          Radial pulses are 2+ on the right side and 2+ on the left side.       Posterior tibial pulses are 2+ on the right side and 2+ on the left side.     Heart sounds: Normal heart sounds. No murmur heard.    No friction rub. No gallop.  Pulmonary:     Effort: Pulmonary effort is normal. No respiratory distress.     Breath sounds: Normal breath sounds. No stridor. No wheezing, rhonchi or rales.  Chest:     Chest wall: No tenderness.  Abdominal:     General: Bowel sounds are normal. There is no distension.     Palpations: Abdomen is soft.  Musculoskeletal:     Right lower leg: No edema.     Left lower leg: No edema.  Skin:    General: Skin is warm and dry.     Coloration: Skin is not jaundiced or pale.     Findings: No rash.  Neurological:     Mental Status: She is alert.     Motor: No abnormal muscle tone.     Gait: Gait normal.  Psychiatric:        Mood and Affect: Mood normal.        Speech: Speech normal.        Behavior: Behavior normal.       Fall Risk:    11/22/2021    8:16 AM 11/17/2020    2:52 PM 05/14/2020    8:24 AM 11/11/2019    8:26 AM 03/24/2019    3:55 PM  Fall Risk   Falls in the past year? 0 0 0 0 0  Number falls in past yr: 0 0 0 0 0  Injury with Fall? 0 0 0 0 0  Risk for fall due to : No Fall Risks      Follow up Falls prevention discussed;Education provided   Falls evaluation completed     Functional Status Survey: Is the patient deaf or have difficulty hearing?: Yes Does the patient have difficulty seeing, even when wearing glasses/contacts?: No Does the patient have difficulty concentrating, remembering, or making decisions?: Yes Does the patient have difficulty walking or climbing stairs?: No Does the patient have difficulty dressing or bathing?: No Does the patient have difficulty doing  errands alone such as visiting a doctor's office or shopping?: No   Assessment & Plan:    CPE completed today  USPSTF grade A and B recommendations reviewed with patient; age-appropriate recommendations, preventive care, screening tests, etc discussed and encouraged; healthy living encouraged; see AVS for patient education given to patient  Discussed importance of 150 minutes of physical activity weekly, AHA exercise recommendations given to pt in AVS/handout  Discussed importance of healthy diet:  eating lean meats and proteins, avoiding trans fats and saturated fats,  avoid simple sugars and excessive carbs in diet, eat 6 servings of fruit/vegetables daily and drink plenty of water and avoid sweet beverages.    Recommended pt to do annual eye exam and routine dental exams/cleanings  Depression, alcohol, fall screening completed as documented above and per flowsheets  Advance Care planning information and packet discussed and offered today, encouraged pt to discuss with family members/spouse/partner/friends and complete Advanced directive packet and bring copy to office   Reviewed Health Maintenance: Health Maintenance  Topic Date Due   COVID-19 Vaccine (4 - Pfizer series) 05/04/2020   MAMMOGRAM  02/14/2022   PAP SMEAR-Modifier  11/18/2023   TETANUS/TDAP  10/10/2025   INFLUENZA VACCINE  Completed   Hepatitis C Screening  Completed   HIV Screening  Completed   HPV VACCINES  Aged Out    Immunizations: Immunization History  Administered Date(s) Administered   Influenza, Seasonal, Injecte, Preservative Fre 12/24/2006   Influenza,inj,Quad PF,6+ Mos 01/03/2017, 11/08/2017, 11/17/2020, 11/22/2021   PFIZER(Purple Top)SARS-COV-2 Vaccination 05/07/2019, 05/29/2019, 03/09/2020   Td 03/20/2004   Tdap 10/11/2015   Vaccines:  HPV: up to at age 16 , ask insurance if age between 17-45  Shingrix: 27-64 yo and ask insurance if covered when patient above 60 yo Pneumonia:  educated and  discussed with patient. Flu:  educated and discussed with patient. COVID:      ICD-10-CM   1. Annual physical exam  S60.15 COMPLETE METABOLIC PANEL WITH GFR    2. Encounter for screening mammogram for malignant neoplasm of breast  Z12.31 MM 3D SCREEN BREAST BILATERAL    3. Hypothyroidism, unspecified type  E03.9 TSH    4. Need for influenza vaccination  Z23 Flu Vaccine QUAD 76moIM (Fluarix, Fluzone & Alfiuria Quad PF)    5. Acquired hypothyroidism  E03.9 Amb Referral to Bariatric Surgery    6. Drug-induced constipation  K59.03 linaclotide (LINZESS) 145 MCG CAPS capsule    7. Decreased hearing, unspecified laterality  H91.90 Ambulatory referral to ENT   hearing screen in office unremarkable - pt can be referred to ENT for further eval if she wishes    8. Metabolic syndrome  EI15.37Amb Referral to Bariatric Surgery    9. Insulin resistance  E88.81 Amb Referral to Bariatric Surgery    10. Class 3 drug-induced obesity with body mass index (BMI) of 40.0 to 44.9 in adult, unspecified whether serious comorbidity present (HCC)  E66.1 Amb Referral to Bariatric Surgery   Z68.41    she has done medical weight management for 2 years and would like to consult with bariatric    11. Medication refill  Z76.0    Pt will stop going to med weight management, so meds will be taken over by primary - thyroid, linzess          LDelsa Grana PA-C 11/22/21 8:50 AM  [facility]

## 2021-11-22 NOTE — Patient Instructions (Signed)
Preventive Care 40-44 Years Old, Female Preventive care refers to lifestyle choices and visits with your health care provider that can promote health and wellness. Preventive care visits are also called wellness exams. What can I expect for my preventive care visit? Counseling Your health care provider may ask you questions about your: Medical history, including: Past medical problems. Family medical history. Pregnancy history. Current health, including: Menstrual cycle. Method of birth control. Emotional well-being. Home life and relationship well-being. Sexual activity and sexual health. Lifestyle, including: Alcohol, nicotine or tobacco, and drug use. Access to firearms. Diet, exercise, and sleep habits. Work and work environment. Sunscreen use. Safety issues such as seatbelt and bike helmet use. Physical exam Your health care provider will check your: Height and weight. These may be used to calculate your BMI (body mass index). BMI is a measurement that tells if you are at a healthy weight. Waist circumference. This measures the distance around your waistline. This measurement also tells if you are at a healthy weight and may help predict your risk of certain diseases, such as type 2 diabetes and high blood pressure. Heart rate and blood pressure. Body temperature. Skin for abnormal spots. What immunizations do I need?  Vaccines are usually given at various ages, according to a schedule. Your health care provider will recommend vaccines for you based on your age, medical history, and lifestyle or other factors, such as travel or where you work. What tests do I need? Screening Your health care provider may recommend screening tests for certain conditions. This may include: Lipid and cholesterol levels. Diabetes screening. This is done by checking your blood sugar (glucose) after you have not eaten for a while (fasting). Pelvic exam and Pap test. Hepatitis B test. Hepatitis C  test. HIV (human immunodeficiency virus) test. STI (sexually transmitted infection) testing, if you are at risk. Lung cancer screening. Colorectal cancer screening. Mammogram. Talk with your health care provider about when you should start having regular mammograms. This may depend on whether you have a family history of breast cancer. BRCA-related cancer screening. This may be done if you have a family history of breast, ovarian, tubal, or peritoneal cancers. Bone density scan. This is done to screen for osteoporosis. Talk with your health care provider about your test results, treatment options, and if necessary, the need for more tests. Follow these instructions at home: Eating and drinking  Eat a diet that includes fresh fruits and vegetables, whole grains, lean protein, and low-fat dairy products. Take vitamin and mineral supplements as recommended by your health care provider. Do not drink alcohol if: Your health care provider tells you not to drink. You are pregnant, may be pregnant, or are planning to become pregnant. If you drink alcohol: Limit how much you have to 0-1 drink a day. Know how much alcohol is in your drink. In the [place] one drink equals one 12 oz bottle of beer (355 mL), one 5 oz glass of wine (148 mL), or one 1 oz glass of hard liquor (44 mL). Lifestyle Brush your teeth every morning and night with fluoride toothpaste. Floss one time each day. Exercise for at least 30 minutes 5 or more days each week. Do not use any products that contain nicotine or tobacco. These products include cigarettes, chewing tobacco, and vaping devices, such as e-cigarettes. If you need help quitting, ask your health care provider. Do not use drugs. If you are sexually active, practice safe sex. Use a condom or other form of protection to   prevent STIs. If you do not wish to become pregnant, use a form of birth control. If you plan to become pregnant, see your health care provider for a  prepregnancy visit. Take aspirin only as told by your health care provider. Make sure that you understand how much to take and what form to take. Work with your health care provider to find out whether it is safe and beneficial for you to take aspirin daily. Find healthy ways to manage stress, such as: Meditation, yoga, or listening to music. Journaling. Talking to a trusted person. Spending time with friends and family. Minimize exposure to UV radiation to reduce your risk of skin cancer. Safety Always wear your seat belt while driving or riding in a vehicle. Do not drive: If you have been drinking alcohol. Do not ride with someone who has been drinking. When you are tired or distracted. While texting. If you have been using any mind-altering substances or drugs. Wear a helmet and other protective equipment during sports activities. If you have firearms in your house, make sure you follow all gun safety procedures. Seek help if you have been physically or sexually abused. What's next? Visit your health care provider once a year for an annual wellness visit. Ask your health care provider how often you should have your eyes and teeth checked. Stay up to date on all vaccines. This information is not intended to replace advice given to you by your health care provider. Make sure you discuss any questions you have with your health care provider. Document Revised: 09/01/2020 Document Reviewed: 09/01/2020 Elsevier Patient Education  [place].

## 2021-11-23 ENCOUNTER — Ambulatory Visit (INDEPENDENT_AMBULATORY_CARE_PROVIDER_SITE_OTHER): Payer: BC Managed Care – PPO | Admitting: Licensed Clinical Social Worker

## 2021-11-23 ENCOUNTER — Encounter (HOSPITAL_COMMUNITY): Payer: Self-pay | Admitting: Licensed Clinical Social Worker

## 2021-11-23 DIAGNOSIS — F3181 Bipolar II disorder: Secondary | ICD-10-CM | POA: Diagnosis not present
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
## 2021-11-26 ENCOUNTER — Other Ambulatory Visit: Payer: Self-pay | Admitting: Psychiatry

## 2021-11-26 DIAGNOSIS — F5105 Insomnia due to other mental disorder: Secondary | ICD-10-CM

## 2021-11-26 DIAGNOSIS — F3181 Bipolar II disorder: Secondary | ICD-10-CM

## 2021-11-26 DIAGNOSIS — F429 Obsessive-compulsive disorder, unspecified: Secondary | ICD-10-CM

## 2021-11-27 NOTE — Plan of Care (Signed)
  Problem: Bipolar Disorder CCP Problem  1 Alleviate depressive/manic symptoms and return to improved levels of effective functioning. Goal: LTG: Stabilize mood and increase goal-directed behavior: Input needed on appropriate metric Outcome: Not Progressing Note: Pt reports a recent depressive episode Goal: STG: [name] will attend at least 80% of scheduled follow-up medication management appointments Outcome: Progressing   Problem: Depression  Goal:  Decrease depressive symptoms and improve levels of effective functioning-pt reports a decrease in overall depression symptoms 3 out of 5 sessions documented.  Outcome: Not Progressing Note: Pt reporting increase in depression symptoms. Pt reports a recent medication change.  Goal: Develop healthy thinking patterns and beliefs about self, others, and the world that lead to the alleviation and help prevent the relapse of depression per self report 3 out of 5 sessions documented.   Outcome: Progressing Intervention: Encourage self-care activities Note: Continued to encourage Intervention: Encourage verbalization of feelings/concerns/expectations Note: Encouraged

## 2021-12-01 ENCOUNTER — Encounter: Payer: Self-pay | Admitting: Family Medicine

## 2021-12-04 ENCOUNTER — Other Ambulatory Visit: Payer: Self-pay | Admitting: Obstetrics and Gynecology

## 2021-12-04 DIAGNOSIS — Z3009 Encounter for other general counseling and advice on contraception: Secondary | ICD-10-CM

## 2021-12-21 ENCOUNTER — Encounter (INDEPENDENT_AMBULATORY_CARE_PROVIDER_SITE_OTHER): Payer: Self-pay | Admitting: Adult Health

## 2021-12-21 ENCOUNTER — Ambulatory Visit (INDEPENDENT_AMBULATORY_CARE_PROVIDER_SITE_OTHER): Payer: BC Managed Care – PPO | Admitting: Adult Health

## 2021-12-21 VITALS — BP 108/75 | HR 74 | Temp 98.3°F | Ht 65.0 in | Wt 243.0 lb

## 2021-12-21 DIAGNOSIS — E038 Other specified hypothyroidism: Secondary | ICD-10-CM

## 2021-12-21 DIAGNOSIS — E88819 Insulin resistance, unspecified: Secondary | ICD-10-CM | POA: Diagnosis not present

## 2021-12-21 DIAGNOSIS — Z6841 Body Mass Index (BMI) 40.0 and over, adult: Secondary | ICD-10-CM

## 2021-12-21 DIAGNOSIS — E669 Obesity, unspecified: Secondary | ICD-10-CM | POA: Diagnosis not present

## 2021-12-21 DIAGNOSIS — E559 Vitamin D deficiency, unspecified: Secondary | ICD-10-CM | POA: Diagnosis not present

## 2021-12-21 MED ORDER — VITAMIN D (ERGOCALCIFEROL) 1.25 MG (50000 UNIT) PO CAPS: 50000.0000 [IU] | ORAL_CAPSULE | ORAL | 0 refills | Status: DC

## 2021-12-21 MED ORDER — METFORMIN HCL ER 500 MG PO TB24: 1500.0000 mg | ORAL_TABLET | Freq: Every day | ORAL | 3 refills | Status: DC

## 2021-12-28 NOTE — Progress Notes (Signed)
Chief Complaint:   OBESITY Kathryn Munoz is here to discuss her progress with her obesity treatment plan along with follow-up of her obesity related diagnoses. [name] is on the Category 3 Plan and states she is following her eating plan approximately 0% of the time. [name] states she is not exercising.   Today's visit was #: 37 Starting weight: 252 lbs Starting date: 07/16/2021 Today's weight: 243 lbs Today's date: 12/21/2021 Total lbs lost to date: 9 lbs Total lbs lost since last in-office visit: 1 lb  Interim History:  Unable to obtain Wegovy due to national drug shortage.   She has been off GLP-1 therapy >3 months.   She was recently evaluated by Bariatric Surgery Specialist at [facility].  Subjective:   1. Vitamin D deficiency 10/11/2021, Vitamin D level 48.4.   2. Other specified hypothyroidism PCP increased levothyroxine 75 mcg to 88 mcg.   She endorses increased energy with Rx increase. She denies palpitations or sweating.  3. Insulin resistance Metformin XR 500 mg TID with meals.   She consistently takes 2 tablets, she has been challenged to take all 3 tablets daily.   Assessment/Plan:   1. Vitamin D deficiency Refill - Vitamin D, Ergocalciferol, (DRISDOL) 1.25 MG (50000 UNIT) CAPS capsule; Take 1 capsule (50,000 Units total) by mouth every 7 (seven) days.  Dispense: 4 capsule; Refill: 0  2. Other specified hypothyroidism Continue daily levothyroxine 88 mcg.  3. Insulin resistance Refill - metFORMIN (GLUCOPHAGE-XR) 500 MG 24 hr tablet; Take 3 tablets (1,500 mg total) by mouth daily.  Dispense: 270 tablet; Refill: 3  4. Obesity with current BMI 40.6 [name] is currently in the action stage of change. As such, her goal is to continue with weight loss efforts. She has agreed to practicing portion control and making smarter food choices, such as increasing vegetables and decreasing simple carbohydrates.   Exercise goals:  As is.   Behavioral modification strategies:  increasing lean protein intake, decreasing simple carbohydrates, meal planning and cooking strategies, keeping healthy foods in the home, and planning for success.  [name] has agreed to follow-up with our clinic in 12 weeks. She was informed of the importance of frequent follow-up visits to maximize her success with intensive lifestyle modifications for her multiple health conditions.   Objective:   Blood pressure 108/75, pulse 74, temperature 98.3 F (36.8 C), height 5\' 5"  (1.651 m), weight 243 lb (110.2 kg), last menstrual period 09/11/2021, SpO2 97 %. Body mass index is 40.44 kg/m.  General: Cooperative, alert, well developed, in no acute distress. HEENT: Conjunctivae and lids unremarkable. Cardiovascular: Regular rhythm.  Lungs: Normal work of breathing. Neurologic: No focal deficits.   Lab Results  Component Value Date   CREATININE 0.88 10/11/2021   BUN 12 10/11/2021   NA 141 10/11/2021   K 4.6 10/11/2021   CL 103 10/11/2021   CO2 23 10/11/2021   Lab Results  Component Value Date   ALT 12 10/11/2021   AST 26 10/11/2021   ALKPHOS 61 10/11/2021   BILITOT 0.5 10/11/2021   Lab Results  Component Value Date   HGBA1C 5.3 10/11/2021   HGBA1C 5.1 06/01/2021   HGBA1C 5.1 02/02/2021   HGBA1C 5.3 08/05/2020   HGBA1C 5.0 05/04/2020   Lab Results  Component Value Date   INSULIN 30.4 (H) 10/11/2021   INSULIN 36.2 (H) 06/01/2021   INSULIN 83.7 (H) 02/02/2021   INSULIN 42.7 (H) 08/05/2020   INSULIN 46.7 (H) 05/04/2020   Lab Results  Component Value Date  TSH 3.320 10/11/2021   Lab Results  Component Value Date   CHOL 170 06/01/2021   HDL 53 06/01/2021   LDLCALC 89 06/01/2021   TRIG 163 (H) 06/01/2021   CHOLHDL 3.2 06/01/2021   Lab Results  Component Value Date   VD25OH 48.4 10/11/2021   VD25OH 56.4 06/01/2021   VD25OH 31.5 02/02/2021   Lab Results  Component Value Date   WBC 7.1 05/17/2021   HGB 12.6 05/17/2021   HCT 37.6 05/17/2021   MCV 93 05/17/2021    PLT 324 05/17/2021   No results found for: "IRON", "TIBC", "FERRITIN"  Attestation Statements:   Reviewed by clinician on day of visit: allergies, medications, problem list, medical history, surgical history, family history, social history, and previous encounter notes.  I, Malcolm Metro, RMA, am acting as Energy manager for [name], NP.  I have reviewed the above documentation for accuracy and completeness, and I agree with the above. -  Maanasa Aderhold d. Malessa Zartman, NP-C

## 2022-01-03 ENCOUNTER — Encounter: Payer: Self-pay | Admitting: Obstetrics and Gynecology

## 2022-01-07 ENCOUNTER — Other Ambulatory Visit: Payer: Self-pay | Admitting: Obstetrics and Gynecology

## 2022-01-07 DIAGNOSIS — Z3009 Encounter for other general counseling and advice on contraception: Secondary | ICD-10-CM

## 2022-01-09 ENCOUNTER — Ambulatory Visit (INDEPENDENT_AMBULATORY_CARE_PROVIDER_SITE_OTHER): Payer: BC Managed Care – PPO | Admitting: Licensed Clinical Social Worker

## 2022-01-09 DIAGNOSIS — F3181 Bipolar II disorder: Secondary | ICD-10-CM | POA: Diagnosis not present

## 2022-01-09 NOTE — Progress Notes (Signed)
Virtual Visit via Video Note  I connected with Kathryn Munoz on 01/09/22 at  2:00 PM EDT by a video enabled telemedicine application and verified that I am speaking with the correct person using two identifiers.  Video connection was lost when less than 50% of the duration of the visit was complete, at which time the remainder of the visit was completed via audio only.  Location: Patient: home Provider: [facility]   I discussed the limitations of evaluation and management by telemedicine and the availability of in person appointments. The patient expressed understanding and agreed to proceed.   I discussed the assessment and treatment plan with the patient. The patient was provided an opportunity to ask questions and all were answered. The patient agreed with the plan and demonstrated an understanding of the instructions.   The patient was advised to call back or seek an in-person evaluation if the symptoms worsen or if the condition fails to improve as anticipated.  I provided 30 minutes of non-face-to-face time during this encounter.  Danaysha Kirn R Shawnta Zimbelman, LCSW  THERAPIST PROGRESS NOTE  Session Time: 210-240p  Participation Level: Active  Behavioral Response: Neat and Well GroomedAlertAnxious and Depressed  Type of Therapy: Individual Therapy  Treatment Goals addressed: PProblem: Bipolar Disorder CCP Problem  1 Alleviate depressive/manic symptoms and return to improved levels of effective functioning. Goal: LTG: Stabilize mood and increase goal-directed behavior: Input needed on appropriate metric Outcome: Progressing Goal: STG: [name] will attend at least 80% of scheduled follow-up medication management appointments Outcome: Progressing   Problem: Depression  Goal:  Decrease depressive symptoms and improve levels of effective functioning-pt reports a decrease in overall depression symptoms 3 out of 5 sessions documented.  Outcome:  Progressing Goal: Develop healthy thinking patterns and beliefs about self, others, and the world that lead to the alleviation and help prevent the relapse of depression per self report 3 out of 5 sessions documented.   Outcome: Progressing   ProgressTowards Goals: Progressing  Interventions: CBT and DBT  Summary: Kathryn Munoz is a 46 y.o. female who presents with improving symptoms related to bipolar disorder. Pt feels overall mood is stable and that she is managing situational stressors better since last session.   Allowed pt to explore and express thoughts and feelings associated with recent life situations and external stressors. Pt reports that she is feeling good after securing a new job. 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 Offered pt unconditional positive supports.  Explored family life: pt feels that family members have been supportive and that they are working with pt to get home office all set up.    Discussed recent dream: pt dreamed that she spoke at her father's funeral. Pt states that her father died when her mother was pregnant, so pt never met her father. Allowed pt to share thoughts/feelings about this.  Reviewed coping skills for managing depression symptoms and anxiety/stress symptoms. Pt reflects understanding and willingness to use coping skills as needed.   Continued recommendations are as follows: self care behaviors, positive social engagements, focusing on overall work/home/life balance, and focusing on positive physical and emotional wellness.    Suicidal/Homicidal:  No  Therapist Response: Pt is continuing to apply interventions  learned in session into daily life situations. Pt is currently on track to meet goals utilizing interventions mentioned above. Personal growth and progress noted. Treatment to continue as indicated.   Plan: Return again in 4 weeks.  Diagnosis: Bipolar II disorder, mild, depressed, with anxious distress ([place])  Collaboration of Care: Other Pt encouraged to continue care with psychiatrist of record, Dr. Shea Evans  Patient/Guardian was advised Release of Information must be obtained prior to any record release in order to collaborate their care with an outside provider. Patient/Guardian was advised if they have not already done so to contact the registration department to sign all necessary forms in order for [place] to release information regarding their care.   Consent: Patient/Guardian gives verbal consent for treatment and assignment of benefits for services provided during this visit. Patient/Guardian expressed understanding and agreed to proceed.   [place], LCSW 01/09/2022

## 2022-01-10 NOTE — Plan of Care (Signed)
  Problem: Bipolar Disorder CCP Problem  1 Alleviate depressive/manic symptoms and return to improved levels of effective functioning. Goal: LTG: Stabilize mood and increase goal-directed behavior: Input needed on appropriate metric Outcome: Progressing Goal: STG: [name] will attend at least 80% of scheduled follow-up medication management appointments Outcome: Progressing   Problem: Depression  Goal:  Decrease depressive symptoms and improve levels of effective functioning-pt reports a decrease in overall depression symptoms 3 out of 5 sessions documented.  Outcome: Progressing Goal: Develop healthy thinking patterns and beliefs about self, others, and the world that lead to the alleviation and help prevent the relapse of depression per self report 3 out of 5 sessions documented.   Outcome: Progressing

## 2022-01-24 ENCOUNTER — Encounter: Payer: Self-pay | Admitting: Family Medicine

## 2022-01-29 ENCOUNTER — Other Ambulatory Visit: Payer: Self-pay | Admitting: Psychiatry

## 2022-01-29 DIAGNOSIS — F429 Obsessive-compulsive disorder, unspecified: Secondary | ICD-10-CM

## 2022-02-02 ENCOUNTER — Other Ambulatory Visit: Payer: Self-pay | Admitting: Obstetrics and Gynecology

## 2022-02-02 DIAGNOSIS — Z3009 Encounter for other general counseling and advice on contraception: Secondary | ICD-10-CM

## 2022-02-07 ENCOUNTER — Telehealth: Payer: Self-pay

## 2022-02-07 NOTE — Telephone Encounter (Signed)
Transition Care Management Follow-up Telephone Call Date of discharge and from where: 02/05/2022 at [facility] ED How have you been since you were released from the hospital? Better Any questions or concerns? Yes, regards blood work done at [facility]. I advised pt she will need an appointment to go over that with PCP.  Items Reviewed: Did the pt receive and understand the discharge instructions provided? Yes  Medications obtained and verified? Yes  Other? No  Any new allergies since your discharge? No  Dietary orders reviewed? No Do you have support at home? Yes   Home Care and Equipment/Supplies: Were home health services ordered? Not applicable If so, what is the name of the agency? N/A  Has the agency set up a time to come to the patient's home? N/A Were any new equipment or medical supplies ordered?  No What is the name of the medical supply agency? N/A Were you able to get the supplies/equipment? not applicable Do you have any questions related to the use of the equipment or supplies? No  Functional Questionnaire: (I = Independent and D = Dependent) ADLs: I  Bathing/Dressing- I  Meal Prep- I  Eating- I  Maintaining continence- I  Transferring/Ambulation- I  Managing Meds- I  Follow up appointments reviewed:  PCP Hospital f/u appt confirmed? Yes  Transferred to [facility] to schedule her. Specialist Hospital f/u appt confirmed? No Specialist appt. needed Are transportation arrangements needed? No  If their condition worsens, is the pt aware to call PCP or go to the Emergency Dept.? Yes Was the patient provided with contact information for the PCP's office or ED? Yes Was to pt encouraged to call back with questions or concerns? Yes

## 2022-02-15 ENCOUNTER — Ambulatory Visit
Admission: RE | Admit: 2022-02-15 | Discharge: 2022-02-15 | Disposition: A | Discharge status: Home / Self Care | Payer: BC Managed Care – PPO | Source: Ambulatory Visit | Attending: Family Medicine | Admitting: Family Medicine

## 2022-02-15 DIAGNOSIS — Z1231 Encounter for screening mammogram for malignant neoplasm of breast: Secondary | ICD-10-CM | POA: Diagnosis present

## 2022-02-15 NOTE — Progress Notes (Unsigned)
   Established Patient Office Visit  Subjective   Patient ID: Kathryn Munoz, female    DOB: 1975/05/20  Age: 46 y.o. MRN: [id number]  No chief complaint on file.   HPI  Patient is presenting for ER follow up.   Discharge Date: 02/05/22 Diagnosis: BPPV Procedures/tests: Viral panel negative, Urine dip and hcg negative, EKG negative, Dix-Hallpike positive  Consultants: None New medications: Meclizine  Discontinued medications: None Status: {Blank multiple:19196::"better","worse","stable","fluctuating"}  Patient presented to the ER after having dizziness for 1 day. She reported room spinning dizziness worse with bending her neck.   {History (Optional):23778}  ROS    Objective:     There were no vitals taken for this visit. {Vitals History (Optional):23777}  Physical Exam   No results found for any visits on 02/16/22.  {Labs (Optional):23779}  The 10-year ASCVD risk score (Arnett DK, et al., 2019) is: 0.8%    Assessment & Plan:   Problem List Items Addressed This Visit   None   No follow-ups on file.    Margarita Mail, DO

## 2022-02-16 ENCOUNTER — Ambulatory Visit (INDEPENDENT_AMBULATORY_CARE_PROVIDER_SITE_OTHER): Payer: BC Managed Care – PPO | Admitting: Internal Medicine

## 2022-02-16 ENCOUNTER — Encounter: Payer: Self-pay | Admitting: Internal Medicine

## 2022-02-16 VITALS — BP 126/84 | HR 97 | Temp 98.5°F | Resp 16 | Ht 65.0 in | Wt 251.1 lb

## 2022-02-16 DIAGNOSIS — H8113 Benign paroxysmal vertigo, bilateral: Secondary | ICD-10-CM | POA: Diagnosis not present

## 2022-02-16 MED ORDER — ONDANSETRON HCL 4 MG PO TABS: 4.0000 mg | ORAL_TABLET | Freq: Three times a day (TID) | ORAL | 1 refills | Status: DC | PRN

## 2022-02-16 NOTE — Patient Instructions (Signed)
It was great seeing you today!  Plan discussed at today's visit: -Blood work ordered today, results will be uploaded to MyChart.  -Continue to stay well hydrated, recommend a multivitamin and can take Maclizine and Zofran as needed for dizziness/nausea. Continue exercises the ER gave you as needed. If you are having more flares let me know and we can get you in with PT.   Follow up in: as needed  Take care and let us know if you have any questions or concerns prior to your next visit.  Dr. Caralee Ates  Benign Positional Vertigo Vertigo is the feeling that you or your surroundings are moving when they are not. Benign positional vertigo is the most common form of vertigo. This is usually a harmless condition (benign). This condition is positional. This means that symptoms are triggered by certain movements and positions. This condition can be dangerous if it occurs while you are doing something that could cause harm to yourself or others. This includes activities such as driving or operating machinery. What are the causes? The inner ear has fluid-filled canals that help your brain sense movement and balance. When the fluid moves, the brain receives messages about your body's position. With benign positional vertigo, calcium crystals in the inner ear break free and disturb the inner ear area. This causes your brain to receive confusing messages about your body's position. What increases the risk? You are more likely to develop this condition if: You are a woman. You are 78 years of age or older. You have recently had a head injury. You have an inner ear disease. What are the signs or symptoms? Symptoms of this condition usually happen when you move your head or your eyes in different directions. Symptoms may start suddenly and usually last for less than a minute. They include: Loss of balance and falling. Feeling like you are spinning or moving. Feeling like your surroundings are spinning or  moving. Nausea and vomiting. Blurred vision. Dizziness. Involuntary eye movement (nystagmus). Symptoms can be mild and cause only minor problems, or they can be severe and interfere with daily life. Episodes of benign positional vertigo may return (recur) over time. Symptoms may also improve over time. How is this diagnosed? This condition may be diagnosed based on: Your medical history. A physical exam of the head, neck, and ears. Positional tests to check for or stimulate vertigo. You may be asked to turn your head and change positions, such as going from sitting to lying down. A health care provider will watch for symptoms of vertigo. You may be referred to a health care provider who specializes in ear, nose, and throat problems (ENT or otolaryngologist) or a provider who specializes in disorders of the nervous system (neurologist). How is this treated?  This condition may be treated in a session in which your health care provider moves your head in specific positions to help the displaced crystals in your inner ear move. Treatment for this condition may take several sessions. Surgery may be needed in severe cases, but this is rare. In some cases, benign positional vertigo may resolve on its own in 2-4 weeks. Follow these instructions at home: Safety Move slowly. Avoid sudden body or head movements or certain positions, as told by your health care provider. Avoid driving or operating machinery until your health care provider says it is safe. Avoid doing any tasks that would be dangerous to you or others if vertigo occurs. If you have trouble walking or keeping your balance, try  using a cane for stability. If you feel dizzy or unstable, sit down right away. Return to your normal activities as told by your health care provider. Ask your health care provider what activities are safe for you. General instructions Take over-the-counter and prescription medicines only as told by your health care  provider. Drink enough fluid to keep your urine pale yellow. Keep all follow-up visits. This is important. Contact a health care provider if: You have a fever. Your condition gets worse or you develop new symptoms. Your family or friends notice any behavioral changes. You have nausea or vomiting that gets worse. You have numbness or a prickling and tingling sensation. Get help right away if you: Have difficulty speaking or moving. Are always dizzy or faint. Develop severe headaches. Have weakness in your legs or arms. Have changes in your hearing or vision. Develop a stiff neck. Develop sensitivity to light. These symptoms may represent a serious problem that is an emergency. Do not wait to see if the symptoms will go away. Get medical help right away. Call your local emergency services (911 in the [place] Do not drive yourself to the hospital. Summary Vertigo is the feeling that you or your surroundings are moving when they are not. Benign positional vertigo is the most common form of vertigo. This condition is caused by calcium crystals in the inner ear that become displaced. This causes a disturbance in an area of the inner ear that helps your brain sense movement and balance. Symptoms include loss of balance and falling, feeling that you or your surroundings are moving, nausea and vomiting, and blurred vision. This condition can be diagnosed based on symptoms, a physical exam, and positional tests. Follow safety instructions as told by your health care provider and keep all follow-up visits. This is important. This information is not intended to replace advice given to you by your health care provider. Make sure you discuss any questions you have with your health care provider. Document Revised: 02/04/2020 Document Reviewed: 02/04/2020 Elsevier Patient Education  2023 [employer].

## 2022-02-17 LAB — TSH: TSH: 3.29 mIU/L

## 2022-02-20 MED ORDER — LEVOTHYROXINE SODIUM 100 MCG PO TABS: 100.0000 ug | ORAL_TABLET | Freq: Every day | ORAL | 0 refills | Status: DC

## 2022-02-20 NOTE — Addendum Note (Signed)
Addended by: Danelle Berry on: 02/20/2022 06:03 PM   Modules accepted: Orders

## 2022-02-21 ENCOUNTER — Ambulatory Visit (INDEPENDENT_AMBULATORY_CARE_PROVIDER_SITE_OTHER): Payer: BC Managed Care – PPO | Admitting: Licensed Clinical Social Worker

## 2022-02-21 ENCOUNTER — Encounter: Payer: Self-pay | Admitting: Obstetrics and Gynecology

## 2022-02-21 ENCOUNTER — Ambulatory Visit (INDEPENDENT_AMBULATORY_CARE_PROVIDER_SITE_OTHER): Payer: BC Managed Care – PPO | Admitting: Obstetrics and Gynecology

## 2022-02-21 ENCOUNTER — Other Ambulatory Visit: Payer: Self-pay

## 2022-02-21 VITALS — BP 120/81 | HR 72 | Ht 65.0 in | Wt 252.6 lb

## 2022-02-21 DIAGNOSIS — Z01419 Encounter for gynecological examination (general) (routine) without abnormal findings: Secondary | ICD-10-CM

## 2022-02-21 DIAGNOSIS — F3181 Bipolar II disorder: Secondary | ICD-10-CM

## 2022-02-21 DIAGNOSIS — Z3009 Encounter for other general counseling and advice on contraception: Secondary | ICD-10-CM

## 2022-02-21 DIAGNOSIS — Z634 Disappearance and death of family member: Secondary | ICD-10-CM

## 2022-02-21 MED ORDER — ETONOGESTREL-ETHINYL ESTRADIOL 0.12-0.015 MG/24HR VA RING: VAGINAL_RING | VAGINAL | 11 refills | Status: DC

## 2022-02-21 NOTE — Progress Notes (Signed)
Virtual Visit via Video Note  I connected with Kathryn Munoz on 02/21/22 at  1:00 PM EST by a video enabled telemedicine application and verified that I am speaking with the correct person using two identifiers.  Video connection was lost when less than 50% of the duration of the visit was complete, at which time the remainder of the visit was completed via audio only.  Location: Patient: home Provider: remote office [place], [place])   I discussed the limitations of evaluation and management by telemedicine and the availability of in person appointments. The patient expressed understanding and agreed to proceed.   I discussed the assessment and treatment plan with the patient. The patient was provided an opportunity to ask questions and all were answered. The patient agreed with the plan and demonstrated an understanding of the instructions.   The patient was advised to call back or seek an in-person evaluation if the symptoms worsen or if the condition fails to improve as anticipated.  I provided 50 minutes of non-face-to-face time during this encounter.  Samer Dutton R Isha Seefeld, LCSW  THERAPIST PROGRESS NOTE  Session Time: 1-150p  Participation Level: Active  Behavioral Response: Neat and Well GroomedAlertAnxious and Depressed  Type of Therapy: Individual Therapy  Treatment Goals addressed: PProblem: Bipolar Disorder CCP Problem  1 Alleviate depressive/manic symptoms and return to improved levels of effective functioning. Goal: LTG: Stabilize mood and increase goal-directed behavior: Input needed on appropriate metric Outcome: Progressing Goal: STG: [name] will attend at least 80% of scheduled follow-up medication management appointments Outcome: Progressing   Problem: Depression  Goal:  Decrease depressive symptoms and improve levels of effective functioning-pt reports a decrease in overall depression symptoms 3 out of 5 sessions documented.  Outcome:  Progressing Goal: Develop healthy thinking patterns and beliefs about self, others, and the world that lead to the alleviation and help prevent the relapse of depression per self report 3 out of 5 sessions documented.   Outcome: Progressing   ProgressTowards Goals: Progressing  Interventions: CBT and DBT  Summary: Kathryn Munoz is a 46 y.o. female who presents with improving symptoms related to bipolar disorder. Pt feels overall mood is stable and that she is managing situational stressors better since last session.   Allowed pt to explore and express thoughts and feelings associated with recent life situations and external stressors.[name] reports that she is continuing to have stress associated with eating behaviors. Explored patient's concerns, and discussed different ways of managing eating behaviors. Patient reports that she is doing a lot of snacking before dinner and after dinner. Patient reports that she is snacking on a lot of high sugar items. Patient wants to be able to manage these behaviors prior to going through weight loss surgery. Discussed item replacement--limiting sugar, and eating veggies instead. Patient reports that she feels that she can do this. Allowed patient to construct and eating routine that she feels that she can manage. Patient is cooperative and feels that she can move forward.  Allow patient safe space to explore continuing grief associated with the loss of her mother. Patient reports the holidays have been triggering for her. Explored grief and discussed common grieving concerns triggered by holidays.  Patient reports that her husband is continuing to be a good support system for her.  Continued recommendations are as follows: self care behaviors, positive social engagements, focusing on overall work/home/life balance, and focusing on positive physical and emotional wellness.    Suicidal/Homicidal: No  Therapist Response: Pt is continuing to apply  interventions learned in session into daily life situations. Pt is currently on track to meet goals utilizing interventions mentioned above. Personal growth and progress noted. Treatment to continue as indicated.   Plan: Return again in 4 weeks.  Diagnosis:  Encounter Diagnoses  Name Primary?   Bipolar II disorder, mild, depressed, with anxious distress (HCC) Yes   Bereavement    Collaboration of Care: Other Pt encouraged to continue care with psychiatrist of record, Dr. Elna Breslow  Patient/Guardian was advised Release of Information must be obtained prior to any record release in order to collaborate their care with an outside provider. Patient/Guardian was advised if they have not already done so to contact the registration department to sign all necessary forms in order for [place] to release information regarding their care.   Consent: Patient/Guardian gives verbal consent for treatment and assignment of benefits for services provided during this visit. Patient/Guardian expressed understanding and agreed to proceed.   Ernest Haber Ivadell Gaul, LCSW 02/22/2022

## 2022-02-21 NOTE — Progress Notes (Signed)
Patients presents for annual exam today. She states irregular cycles with Nuvaring use, no cramping, would like to continue. Patient is up to date on pap smear and mammogram. Annual labs recently done with PCP. Patient states no other questions or concerns at this time. Marland Kitchen

## 2022-02-21 NOTE — Progress Notes (Signed)
HPI:      [name] is a 46 y.o. [id number] who LMP was Patient's last menstrual period was 01/11/2022 (approximate).  Subjective:   She presents today for her annual examination.  She is using NuvaRing successfully.  She says some of her periods are light and somewhat heavier.  She is still okay with that and would like to continue.    Hx: The following portions of the patient's history were reviewed and updated as appropriate:             She  has a past medical history of Abnormal mammogram, ADD (attention deficit disorder), Anxiety, Bipolar 2 disorder (HCC), Constipation, Depression, Dermatitis, Heavy menstrual bleeding, History of gestational diabetes, History of kidney stones (2014), Hypothyroidism, IFG (impaired fasting glucose), Insulin resistance, Irregular periods, Joint pain, Multiple food allergies, Neuropathy, Obesity, OCD (obsessive compulsive disorder), Prediabetes, and Vitamin D deficiency disease. She does not have any pertinent problems on file. She  has a past surgical history that includes Intrauterine device insertion (03/11/2012); Breast biopsy (Left, 2014); and Trigger finger release (Right, 04/21/2021). Her family history includes ADD / ADHD in her son; Alcohol abuse in her father and mother; Anxiety disorder in her mother and another family member; Breast cancer in her cousin; Depression in her mother; Diabetes in her brother, mother, and sister; Drug abuse in her brother; Eczema in her son; Heart disease in her mother; Hypertension in her mother; Kidney disease in her mother; Pancreatitis in her mother; Schizophrenia in her mother; Stroke in her mother; Thyroid disease in her mother. She  reports that she has never smoked. She has never used smokeless tobacco. She reports current alcohol use. She reports that she does not use drugs. She has a current medication list which includes the following prescription(s): citalopram, epinephrine, hydroxyzine, lamotrigine,  levothyroxine, linaclotide, meclizine, metformin, nuvaring, nystatin, ondansetron, trazodone, vitamin d (ergocalciferol), and lamotrigine. She is allergic to date seed extract  [zizyphus jujuba] and other.       Review of Systems:  Review of Systems  Constitutional: Denied constitutional symptoms, night sweats, recent illness, fatigue, fever, insomnia and weight loss.  Eyes: Denied eye symptoms, eye pain, photophobia, vision change and visual disturbance.  Ears/Nose/Throat/Neck: Denied ear, nose, throat or neck symptoms, hearing loss, nasal discharge, sinus congestion and sore throat.  Cardiovascular: Denied cardiovascular symptoms, arrhythmia, chest pain/pressure, edema, exercise intolerance, orthopnea and palpitations.  Respiratory: Denied pulmonary symptoms, asthma, pleuritic pain, productive sputum, cough, dyspnea and wheezing.  Gastrointestinal: Denied, gastro-esophageal reflux, melena, nausea and vomiting.  Genitourinary: Denied genitourinary symptoms including symptomatic vaginal discharge, pelvic relaxation issues, and urinary complaints.  Musculoskeletal: Denied musculoskeletal symptoms, stiffness, swelling, muscle weakness and myalgia.  Dermatologic: Denied dermatology symptoms, rash and scar.  Neurologic: Denied neurology symptoms, dizziness, headache, neck pain and syncope.  Psychiatric: Denied psychiatric symptoms, anxiety and depression.  Endocrine: Denied endocrine symptoms including hot flashes and night sweats.   Meds:   Current Outpatient Medications on File Prior to Visit  Medication Sig Dispense Refill   citalopram (CELEXA) 20 MG tablet Take 1 tablet (20 mg total) by mouth daily. 90 tablet 1   EPINEPHrine 0.3 mg/0.3 mL IJ SOAJ injection INJECT 0.3 MLS INTO THE MUSCLE ONCE FOR 1 DOSE. FOR LIFE-THREATENING ALLERGIC REACTION / ANAPHYLAXIS  1   hydrOXYzine (VISTARIL) 25 MG capsule TAKE 1-3 CAPSULES (25-75 MG TOTAL) BY MOUTH AT BEDTIME AS NEEDED. SLEEP AND ANXIETY 270 capsule  1   lamoTRIgine (LAMICTAL) 150 MG tablet TAKE 1 TABLET (150 MG TOTAL)  BY MOUTH DAILY. TAKE ALONG WITH 25 MG DAILY 90 tablet 1   levothyroxine (SYNTHROID) 100 MCG tablet Take 1 tablet (100 mcg total) by mouth daily. Follow up in office in 6-8 weeks for labs/recheck for more refills 60 tablet 0   linaclotide (LINZESS) 145 MCG CAPS capsule TAKE 1 CAPSULE BY MOUTH EVERY DAY BEFORE BREAKFAST 90 capsule 3   meclizine (ANTIVERT) 25 MG tablet Take by mouth.     metFORMIN (GLUCOPHAGE-XR) 500 MG 24 hr tablet Take 3 tablets (1,500 mg total) by mouth daily. 270 tablet 3   NUVARING 0.12-0.015 MG/24HR vaginal ring PLACE 1 EACH VAGINALLY EVERY 28 (TWENTY-EIGHT) DAYS. INSERT VAGINALLY AND LEAVE IN PLACE FOR 3 CONSECUTIVE WEEKS, THEN REMOVE FOR 1 WEEK. 1 each 0   nystatin (MYCOSTATIN/NYSTOP) powder Apply 1 Application topically 3 (three) times daily. 45 g 0   ondansetron (ZOFRAN) 4 MG tablet Take 1 tablet (4 mg total) by mouth every 8 (eight) hours as needed for nausea or vomiting. 20 tablet 1   traZODone (DESYREL) 50 MG tablet Take 1-2 tablets (50-100 mg total) by mouth at bedtime as needed for sleep. 180 tablet 0   Vitamin D, Ergocalciferol, (DRISDOL) 1.25 MG (50000 UNIT) CAPS capsule Take 1 capsule (50,000 Units total) by mouth every 7 (seven) days. 4 capsule 0   lamoTRIgine (LAMICTAL) 25 MG tablet Take 1 tablet (25 mg total) by mouth daily. Take along with 150 mg - total of 175 mg (Patient not taking: Reported on 02/21/2022) 90 tablet 0   No current facility-administered medications on file prior to visit.     Objective:     Vitals:   02/21/22 0912  BP: 120/81  Pulse: 72    Filed Weights   02/21/22 0912  Weight: 252 lb 9.6 oz (114.6 kg)              Physical examination General NAD, Conversant  HEENT Atraumatic; Op clear with mmm.  Normo-cephalic. Pupils reactive. Anicteric sclerae  Thyroid/Neck Smooth without nodularity or enlargement. Normal ROM.  Neck Supple.  Skin No rashes, lesions or  ulceration. Normal palpated skin turgor. No nodularity.  Breasts: No masses or discharge.  Symmetric.  No axillary adenopathy.  Lungs: Clear to auscultation.No rales or wheezes. Normal Respiratory effort, no retractions.  Heart: NSR.  No murmurs or rubs appreciated. No periferal edema  Abdomen: Soft.  Non-tender.  No masses.  No HSM. No hernia  Extremities: Moves all appropriately.  Normal ROM for age. No lymphadenopathy.  Neuro: Oriented to PPT.  Normal mood. Normal affect.     Pelvic:   Vulva: Normal appearance.  No lesions.  Vagina: No lesions or abnormalities noted.  Support: Normal pelvic support.  Urethra No masses tenderness or scarring.  Meatus Normal size without lesions or prolapse.  Cervix: Normal appearance.  No lesions.  Anus: Normal exam.  No lesions.  Perineum: Normal exam.  No lesions.        Bimanual   Uterus: Normal size.  Non-tender.  Mobile.  AV.  Adnexae: No masses.  Non-tender to palpation.  Cul-de-sac: Negative for abnormality.     Assessment:    [id number] Patient Active Problem List   Diagnosis Date Noted   At risk for nausea 10/11/2021   Prolonged menstruation 04/12/2021   Vertigo    Drug-induced constipation 05/24/2020   Other hyperlipidemia 05/24/2020   Insulin resistance 01/08/2020   Bipolar disorder, in full remission, most recent episode depressed (HCC) 12/23/2019   Heavy menstrual period 06/28/2019   Obsessive-compulsive  disorder with good or fair insight 01/01/2019   Bipolar II disorder in full remission (HCC) 09/05/2018   Insomnia due to mental condition 09/05/2018   Class 3 severe obesity with serious comorbidity and body mass index (BMI) of 40.0 to 44.9 in adult [facility]) 05/09/2018   Metabolic syndrome 10/27/2017   Acanthosis nigricans 10/22/2017   Moderate episode of recurrent major depressive disorder (HCC) 10/22/2017   Acid reflux 04/01/2017   Anxiety 06/01/2016   Morbid obesity with BMI of 40.0-44.9, adult (HCC) 03/12/2015   Peripheral  neuropathy 10/09/2014   Hypothyroidism    IFG (impaired fasting glucose)    Vitamin D deficiency    Neuropathy      1. Well woman exam with routine gynecological exam     NuvaRing   Plan:            1.  Basic Screening Recommendations The basic screening recommendations for asymptomatic women were discussed with the patient during her visit.  The age-appropriate recommendations were discussed with her and the rational for the tests reviewed.  When I am informed by the patient that another primary care physician has previously obtained the age-appropriate tests and they are up-to-date, only outstanding tests are ordered and referrals given as necessary.  Abnormal results of tests will be discussed with her when all of her results are completed.  Routine preventative health maintenance measures emphasized: Exercise/Diet/Weight control, Tobacco Warnings, Alcohol/Substance use risks and Stress Management Patient up-to-date on mammography and Pap 2.  Continue NuvaRing -we have also briefly discussed other options including IUD.  Patient will consider these. Orders No orders of the defined types were placed in this encounter.   No orders of the defined types were placed in this encounter.         F/U  Return in about 1 year (around 02/22/2023) for Annual Physical.  Elonda Husky, M.D. 02/21/2022 9:47 AM

## 2022-02-23 ENCOUNTER — Encounter: Payer: Self-pay | Admitting: Psychiatry

## 2022-02-23 ENCOUNTER — Telehealth (INDEPENDENT_AMBULATORY_CARE_PROVIDER_SITE_OTHER): Payer: BC Managed Care – PPO | Admitting: Psychiatry

## 2022-02-23 DIAGNOSIS — F5105 Insomnia due to other mental disorder: Secondary | ICD-10-CM | POA: Diagnosis not present

## 2022-02-23 DIAGNOSIS — F3181 Bipolar II disorder: Secondary | ICD-10-CM | POA: Diagnosis not present

## 2022-02-23 DIAGNOSIS — F429 Obsessive-compulsive disorder, unspecified: Secondary | ICD-10-CM | POA: Diagnosis not present

## 2022-02-23 MED ORDER — LAMOTRIGINE 25 MG PO TABS: 25.0000 mg | ORAL_TABLET | Freq: Every day | ORAL | 0 refills | Status: DC

## 2022-02-23 MED ORDER — TRAZODONE HCL 50 MG PO TABS: 50.0000 mg | ORAL_TABLET | Freq: Every evening | ORAL | 0 refills | Status: DC | PRN

## 2022-02-23 MED ORDER — CITALOPRAM HYDROBROMIDE 20 MG PO TABS: 20.0000 mg | ORAL_TABLET | Freq: Every day | ORAL | 1 refills | Status: DC

## 2022-02-23 NOTE — Progress Notes (Signed)
Virtual Visit via Video Note  I connected with Kathryn Munoz on 02/23/22 at  1:00 PM EST by a video enabled telemedicine application and verified that I am speaking with the correct person using two identifiers.  Location Provider Location : [facility] Patient Location : Car  Participants: Patient , Provider    I discussed the limitations of evaluation and management by telemedicine and the availability of in person appointments. The patient expressed understanding and agreed to proceed.   I discussed the assessment and treatment plan with the patient. The patient was provided an opportunity to ask questions and all were answered. The patient agreed with the plan and demonstrated an understanding of the instructions.   The patient was advised to call back or seek an in-person evaluation if the symptoms worsen or if the condition fails to improve as anticipated.   BH MD OP Progress Note  02/23/2022 6:19 PM Kathryn Munoz  MRN:  [id number]  Chief Complaint:  Chief Complaint  Patient presents with   Follow-up   Medication Refill   Anxiety   Depression   HPI: Kathryn Munoz is a 46 year old African-American female, lives in [place], has a history of bipolar disorder, insomnia, insulin resistance, hypothyroidism, other specified eating disorder was evaluated by telemedicine today.  Patient today reports she is currently at a new job, started on October 9.  She reports this new job has been challenging and she has to get used to it.  Patient reports she also has been grieving since it is the holiday season, has been having memories of her mother who passed away.  Patient hence reports  being nervous, sleep problems and so on.  She reports she has started taking 2 hydroxyzine at night with the trazodone and that may have helped with the sleep in the past few nights.  Patient reports she is currently taking Lamictal 150 mg only and has not been compliant with the 25 mg.  She is agreeable to  increasing the dosage.  Denies any suicidality, homicidality or perceptual disturbances.  Reports appetite is fair, trying to watch her diet.  Going to have bariatric surgery consultation.  Wonders whether she can be on current medication regimen if she is going to go through with bariatric surgery.  Patient reports she has been compliant with psychotherapy visits, reports therapy sessions as beneficial.  Denies any suicidality, homicidality or perceptual disturbances.  Does have a history of hypothyroidism, her provider has been trying to readjust her levothyroxine.  Currently monitoring it.  Denies any other concerns today.  Visit Diagnosis:    ICD-10-CM   1. Bipolar II disorder, mild, depressed, with anxious distress (HCC)  F31.81     2. Insomnia due to mental condition  F51.05 traZODone (DESYREL) 50 MG tablet   mood    3. 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  Past trials of Celexa.  Past history of suicide at the age of 86 by overdose on pills.  Another suicide attempt at the age of 20-slept through it, did not tell anyone at that time.  Past Medical History:  Past Medical History:  Diagnosis Date   Abnormal mammogram    breast biopsy, [facility] 2014   ADD (attention deficit disorder)    Anxiety    Bipolar 2 disorder (HCC)  Constipation    Depression    Dermatitis    Heavy menstrual bleeding    History of gestational diabetes    History of kidney stones 2014   Hypothyroidism    IFG (impaired fasting glucose)    Insulin resistance    Irregular periods    Joint pain    Multiple food allergies    Dates and nuts   Neuropathy    Obesity    OCD (obsessive compulsive disorder)    Prediabetes    Vitamin D deficiency disease     Past Surgical History:  Procedure Laterality Date   BREAST BIOPSY Left  2014   us bx/clip-neg   INTRAUTERINE DEVICE INSERTION  03/11/2012   TRIGGER FINGER RELEASE Right 04/21/2021    Family Psychiatric History: Reviewed family psychiatric history from progress note on 11/08/2017.  Family History:  Family History  Problem Relation Age of Onset   Diabetes Mother    Pancreatitis Mother    Hypertension Mother    Alcohol abuse Mother    Schizophrenia Mother    Depression Mother    Heart disease Mother    Stroke Mother    Thyroid disease Mother    Kidney disease Mother    Anxiety disorder Mother    Alcohol abuse Father    Diabetes Sister        pre-diabetic   Diabetes Brother    Drug abuse Brother    ADD / ADHD Son    Eczema Son    Breast cancer Cousin        pat cousin   Anxiety disorder Other    Ovarian cancer Neg Hx     Social History: Reviewed social history from progress note on 11/08/2017. Social History   Socioeconomic History   Marital status: Married    Spouse name: Kathryn Munoz   Number of children: 2   Years of education: Not on file   Highest education level: Bachelor's degree (e.g., BA, AB, BS)  Occupational History   Occupation: Charity fundraiserChemist  Tobacco Use   Smoking status: Never   Smokeless tobacco: Never  Vaping Use   Vaping Use: Never used  Substance and Sexual Activity   Alcohol use: Yes    Comment: Ocassionally   Drug use: No   Sexual activity: Yes    Partners: Male    Birth control/protection: Inserts    Comment: nuvaring  Other Topics Concern   Not on file  Social History Narrative   Not on file   Social Determinants of Health   Financial Resource Strain: Low Risk  (11/22/2021)   Overall Financial Resource Strain (CARDIA)    Difficulty of Paying Living Expenses: Not hard at all  Food Insecurity: No Food Insecurity (11/22/2021)   Hunger Vital Sign    Worried About Running Out of Food in the Last Year: Never true    Ran Out of Food in the Last Year: Never true  Transportation Needs: No Transportation Needs  (11/22/2021)   PRAPARE - Administrator, Civil ServiceTransportation    Lack of Transportation (Medical): No    Lack of Transportation (Non-Medical): No  Physical Activity: Inactive (11/22/2021)   Exercise Vital Sign    Days of Exercise per Week: 0 days    Minutes of Exercise per Session: 0 min  Stress: Stress Concern Present (11/22/2021)   [employer] - Occupational Stress Questionnaire    Feeling of Stress : To some extent  Social Connections: Socially Integrated (11/22/2021)   Social Connection and Isolation Panel [  NHANES]    Frequency of Communication with Friends and Family: More than three times a week    Frequency of Social Gatherings with Friends and Family: More than three times a week    Attends Religious Services: 1 to 4 times per year    Active Member of [employer] or Organizations: Yes    Attends Banker Meetings: 1 to 4 times per year    Marital Status: Married    Allergies:  Allergies  Allergen Reactions   Date Seed Extract  [Zizyphus Jujuba] Anaphylaxis, Cough, Itching and Swelling   Other Swelling    Metabolic Disorder Labs: Lab Results  Component Value Date   HGBA1C 5.3 10/11/2021   MPG 117 03/31/2019   MPG 91 05/09/2018   No results found for: "PROLACTIN" Lab Results  Component Value Date   CHOL 170 06/01/2021   TRIG 163 (H) 06/01/2021   HDL 53 06/01/2021   CHOLHDL 3.2 06/01/2021   VLDL 14 10/16/2016   LDLCALC 89 06/01/2021   LDLCALC 93 05/04/2020   Lab Results  Component Value Date   TSH 3.29 02/16/2022   TSH 3.320 10/11/2021    Therapeutic Level Labs: No results found for: "LITHIUM" No results found for: "VALPROATE" No results found for: "CBMZ"  Current Medications: Current Outpatient Medications  Medication Sig Dispense Refill   EPINEPHrine 0.3 mg/0.3 mL IJ SOAJ injection INJECT 0.3 MLS INTO THE MUSCLE ONCE FOR 1 DOSE. FOR LIFE-THREATENING ALLERGIC REACTION / ANAPHYLAXIS  1   etonogestrel-ethinyl estradiol (NUVARING) 0.12-0.015  MG/24HR vaginal ring PLACE 1 EACH VAGINALLY EVERY 28 (TWENTY-EIGHT) DAYS. INSERT VAGINALLY AND LEAVE IN PLACE FOR 3 CONSECUTIVE WEEKS, THEN REMOVE FOR 1 WEEK. 1 each 11   hydrOXYzine (VISTARIL) 25 MG capsule TAKE 1-3 CAPSULES (25-75 MG TOTAL) BY MOUTH AT BEDTIME AS NEEDED. SLEEP AND ANXIETY 270 capsule 1   lamoTRIgine (LAMICTAL) 150 MG tablet TAKE 1 TABLET (150 MG TOTAL) BY MOUTH DAILY. TAKE ALONG WITH 25 MG DAILY 90 tablet 1   levothyroxine (SYNTHROID) 100 MCG tablet Take 1 tablet (100 mcg total) by mouth daily. Follow up in office in 6-8 weeks for labs/recheck for more refills 60 tablet 0   linaclotide (LINZESS) 145 MCG CAPS capsule TAKE 1 CAPSULE BY MOUTH EVERY DAY BEFORE BREAKFAST 90 capsule 3   meclizine (ANTIVERT) 25 MG tablet Take by mouth.     metFORMIN (GLUCOPHAGE-XR) 500 MG 24 hr tablet Take 3 tablets (1,500 mg total) by mouth daily. 270 tablet 3   nystatin (MYCOSTATIN/NYSTOP) powder Apply 1 Application topically 3 (three) times daily. 45 g 0   ondansetron (ZOFRAN) 4 MG tablet Take 1 tablet (4 mg total) by mouth every 8 (eight) hours as needed for nausea or vomiting. 20 tablet 1   Vitamin D, Ergocalciferol, (DRISDOL) 1.25 MG (50000 UNIT) CAPS capsule Take 1 capsule (50,000 Units total) by mouth every 7 (seven) days. 4 capsule 0   citalopram (CELEXA) 20 MG tablet Take 1 tablet (20 mg total) by mouth daily. 90 tablet 1   lamoTRIgine (LAMICTAL) 25 MG tablet Take 1 tablet (25 mg total) by mouth daily. Take along with 150 mg - total of 175 mg 90 tablet 0   traZODone (DESYREL) 50 MG tablet Take 1-2 tablets (50-100 mg total) by mouth at bedtime as needed for sleep. 180 tablet 0   No current facility-administered medications for this visit.     Musculoskeletal: Strength & Muscle Tone:  UTA Gait & Station:  Seated Patient leans: N/A  Psychiatric Specialty Exam:  Review of Systems  Psychiatric/Behavioral:  Positive for sleep disturbance. The patient is nervous/anxious.   All other systems  reviewed and are negative.   Last menstrual period 01/11/2022.There is no height or weight on file to calculate BMI.  General Appearance: Casual  Eye Contact:  Fair  Speech:  Clear and Coherent  Volume:  Normal  Mood:  Anxious  Affect:  Congruent  Thought Process:  Goal Directed and Descriptions of Associations: Intact  Orientation:  Full (Time, Place, and Person)  Thought Content: Logical   Suicidal Thoughts:  No  Homicidal Thoughts:  No  Memory:  Immediate;   Fair Recent;   Fair Remote;   Fair  Judgement:  Fair  Insight:  Fair  Psychomotor Activity:  Normal  Concentration:  Concentration: Fair and Attention Span: Fair  Recall:  [name] of Knowledge: Fair  Language: Fair  Akathisia:  No  Handed:  Right  AIMS (if indicated): not done  Assets:  Communication Skills Desire for Improvement Housing Social Support  ADL's:  Intact  Cognition: WNL  Sleep:   restless at times   Screenings: AIMS    Flowsheet Row Video Visit from 10/24/2021 in [facility] Video Visit from 07/27/2021 in [facility] Video Visit from 10/28/2020 in [facility]  AIMS Total Score 0 0 0      GAD-7    Flowsheet Row Video Visit from 02/23/2022 in [facility] Office Visit from 11/22/2021 in [facility] Video Visit from 11/17/2021 in [facility] Video Visit from 07/27/2021 in [facility] Office Visit from 05/17/2021 in [facility]  Total GAD-7 Score 11 0 3 2 1       PHQ2-9    Flowsheet Row Video Visit from 02/23/2022 in [facility] Office Visit from 02/16/2022 in [facility] Office Visit from 11/22/2021 in [facility] Video Visit from 11/17/2021 in [facility] Video Visit from 10/24/2021 in [facility]  PHQ-2 Total Score 2 2 0 0 5  PHQ-9 Total Score 5 2 0 -- 17      Flowsheet Row Video Visit from 02/23/2022 in [facility] Counselor from 02/21/2022 in [facility] [place] Counselor from 01/09/2022 in [facility] [place]  C-SSRS RISK CATEGORY Low Risk Low Risk Error: Question 1 not populated        Assessment and Plan: Kathryn Munoz is a 46 year old African-American female, married, employed, lives in [place], has a history of bipolar disorder, sleep problem, OCD, hypothyroidism was evaluated by telemedicine today.  Patient is currently struggling with anxiety, grief, sleep problems, will benefit from medication readjustment and psychotherapy sessions.  Plan as noted below.  Plan Bipolar disorder type II depressed with anxious distressed-unstable Readjust lamotrigine, increase lamotrigine to 175 mg p.o. daily.  Reports she only has been taking 150 mg p.o. daily. Celexa 20 mg p.o. daily-dose reduced due to sexual dysfunction. Patient advised to have more frequent sessions of psychotherapy with her therapist Ms. Christina Hussami  OCD-stable Continue CBT with Ms. Christina. Celexa 20 mg p.o. daily  Insomnia-improving Patient to continue hydroxyzine 25 to 75 mg at bedtime as needed Trazodone 50 mg - 100 mg at bedtime. Continue sleep hygiene techniques  Follow-up in clinic in 4 weeks or sooner if needed.   Collaboration of Care: Collaboration of Care: Referral or follow-up with counselor/therapist AEB encouraged to have  more frequent sessions.  Patient/Guardian was advised Release of Information must be obtained prior to any record release in order to collaborate their care with an outside provider. Patient/Guardian was advised if they have not already done so to contact the registration department to sign all necessary forms in order for [place] to release information regarding their care.    Consent: Patient/Guardian gives verbal consent for treatment and assignment of benefits for services provided during this visit. Patient/Guardian expressed understanding and agreed to proceed.   This note was generated in part or whole with voice recognition software. Voice recognition is usually quite accurate but there are transcription errors that can and very often do occur. I apologize for any typographical errors that were not detected and corrected.      Jomarie Longs, MD 02/23/2022, 6:19 PM

## 2022-03-10 NOTE — Progress Notes (Signed)
Sleep Medicine   Office Visit  Patient Name: Kathryn Munoz DOB: 01-14-1976 MRN [id number]    Chief Complaint: Sleep consult  Brief History:  Kathryn Munoz presents for an initial consult for sleep evaluation and to establish care. She is considering bariatric surgery and was sent for sleep evaluation prior. Patient  has a long history of snoring.  Sleep quality is fair. This is noted most nights. The patient's bed partner reports snore at night. The patient relates the following symptoms: some trouble concentrating, brain fogginess, and some fatigue are also present. The patient goes to sleep at 0930 pm and wakes up at 0500 am and will wake up several times each night in between. Sleep quality is better when outside home environment.  Patient has noted significant of her legs at night that would disrupt her sleep.  The patient  relates sleep talking as unusual behavior during the night.  The patient relates bipolar II, OCD, anxiety and depression as a history of psychiatric problems. The Epworth Sleepiness Score is 10 out of 24 .  The patient relates  Cardiovascular risk factors include: none.    ROS  General: (-) fever, (-) chills, (-) night sweat Nose and Sinuses: (-) nasal stuffiness or itchiness, (-) postnasal drip, (-) nosebleeds, (-) sinus trouble. Mouth and Throat: (-) sore throat, (-) hoarseness. Neck: (-) swollen glands, (-) enlarged thyroid, (-) neck pain. Respiratory: - cough, - shortness of breath, - wheezing. Neurologic: + numbness, + tingling. Psychiatric: - anxiety, - depression Sleep behavior: -sleep paralysis -hypnogogic hallucinations -dream enactment      -vivid dreams -cataplexy -night terrors -sleep walking   Current Medication: Outpatient Encounter Medications as of 03/15/2022  Medication Sig   citalopram (CELEXA) 20 MG tablet Take 1 tablet (20 mg total) by mouth daily.   EPINEPHrine 0.3 mg/0.3 mL IJ SOAJ injection INJECT 0.3 MLS INTO THE MUSCLE ONCE FOR 1 DOSE. FOR  LIFE-THREATENING ALLERGIC REACTION / ANAPHYLAXIS   etonogestrel-ethinyl estradiol (NUVARING) 0.12-0.015 MG/24HR vaginal ring PLACE 1 EACH VAGINALLY EVERY 28 (TWENTY-EIGHT) DAYS. INSERT VAGINALLY AND LEAVE IN PLACE FOR 3 CONSECUTIVE WEEKS, THEN REMOVE FOR 1 WEEK.   hydrOXYzine (VISTARIL) 25 MG capsule TAKE 1-3 CAPSULES (25-75 MG TOTAL) BY MOUTH AT BEDTIME AS NEEDED. SLEEP AND ANXIETY   lamoTRIgine (LAMICTAL) 150 MG tablet TAKE 1 TABLET (150 MG TOTAL) BY MOUTH DAILY. TAKE ALONG WITH 25 MG DAILY   lamoTRIgine (LAMICTAL) 25 MG tablet Take 1 tablet (25 mg total) by mouth daily. Take along with 150 mg - total of 175 mg   levothyroxine (SYNTHROID) 100 MCG tablet Take 1 tablet (100 mcg total) by mouth daily. Follow up in office in 6-8 weeks for labs/recheck for more refills   linaclotide (LINZESS) 145 MCG CAPS capsule TAKE 1 CAPSULE BY MOUTH EVERY DAY BEFORE BREAKFAST   metFORMIN (GLUCOPHAGE-XR) 500 MG 24 hr tablet Take 3 tablets (1,500 mg total) by mouth daily.   nystatin (MYCOSTATIN/NYSTOP) powder Apply 1 Application topically 3 (three) times daily.   ondansetron (ZOFRAN) 4 MG tablet Take 1 tablet (4 mg total) by mouth every 8 (eight) hours as needed for nausea or vomiting.   traZODone (DESYREL) 50 MG tablet Take 1-2 tablets (50-100 mg total) by mouth at bedtime as needed for sleep.   Vitamin D, Ergocalciferol, (DRISDOL) 1.25 MG (50000 UNIT) CAPS capsule Take 1 capsule (50,000 Units total) by mouth every 7 (seven) days.   No facility-administered encounter medications on file as of 03/15/2022.    Surgical History: Past Surgical History:  Procedure Laterality Date   BREAST BIOPSY Left 2014   us bx/clip-neg   INTRAUTERINE DEVICE INSERTION  03/11/2012   TRIGGER FINGER RELEASE Right 04/21/2021    Medical History: Past Medical History:  Diagnosis Date   Abnormal mammogram    breast biopsy, [facility] 2014   ADD (attention deficit disorder)    Anxiety    Bipolar 2 disorder (HCC)    Constipation     Depression    Dermatitis    Heavy menstrual bleeding    History of gestational diabetes    History of kidney stones 2014   Hypothyroidism    IFG (impaired fasting glucose)    Insulin resistance    Irregular periods    Joint pain    Multiple food allergies    Dates and nuts   Neuropathy    Obesity    OCD (obsessive compulsive disorder)    Prediabetes    Vitamin D deficiency disease     Family History: Non contributory to the present illness  Social History: Social History   Socioeconomic History   Marital status: Married    Spouse name: Lily KocherJeffrey Lebon   Number of children: 2   Years of education: Not on file   Highest education level: Bachelor's degree (e.g., BA, AB, BS)  Occupational History   Occupation: Charity fundraiserChemist  Tobacco Use   Smoking status: Never   Smokeless tobacco: Never  Vaping Use   Vaping Use: Never used  Substance and Sexual Activity   Alcohol use: Yes    Comment: Ocassionally   Drug use: No   Sexual activity: Yes    Partners: Male    Birth control/protection: Inserts    Comment: nuvaring  Other Topics Concern   Not on file  Social History Narrative   Not on file   Social Determinants of Health   Financial Resource Strain: Low Risk  (11/22/2021)   Overall Financial Resource Strain (CARDIA)    Difficulty of Paying Living Expenses: Not hard at all  Food Insecurity: No Food Insecurity (11/22/2021)   Hunger Vital Sign    Worried About Running Out of Food in the Last Year: Never true    Ran Out of Food in the Last Year: Never true  Transportation Needs: No Transportation Needs (11/22/2021)   PRAPARE - Administrator, Civil ServiceTransportation    Lack of Transportation (Medical): No    Lack of Transportation (Non-Medical): No  Physical Activity: Inactive (11/22/2021)   Exercise Vital Sign    Days of Exercise per Week: 0 days    Minutes of Exercise per Session: 0 min  Stress: Stress Concern Present (11/22/2021)   [employer] - Occupational Stress  Questionnaire    Feeling of Stress : To some extent  Social Connections: Socially Integrated (11/22/2021)   Social Connection and Isolation Panel [NHANES]    Frequency of Communication with Friends and Family: More than three times a week    Frequency of Social Gatherings with Friends and Family: More than three times a week    Attends Religious Services: 1 to 4 times per year    Active Member of [employer]ubs or Organizations: Yes    Attends BankerClub or Organization Meetings: 1 to 4 times per year    Marital Status: Married  Catering managerntimate Partner Violence: Not At Risk (11/22/2021)   Humiliation, Afraid, Rape, and Kick questionnaire    Fear of Current or Ex-Partner: No    Emotionally Abused: No    Physically Abused: No    Sexually Abused: No  Vital Signs: Blood pressure (!) 139/94, pulse 98, resp. rate 18, height 5\' 5"  (1.651 m), weight 254 lb (115.2 kg), last menstrual period 01/11/2022, SpO2 94 %. Body mass index is 42.27 kg/m.   Examination: General Appearance: The patient is well-developed, well-nourished, and in no distress. Neck Circumference: 38.5 cm Skin: Gross inspection of skin unremarkable. Head: normocephalic, no gross deformities. Eyes: no gross deformities noted. ENT: ears appear grossly normal Neurologic: Alert and oriented. No involuntary movements.    STOP BANG RISK ASSESSMENT S (snore) Have you been told that you snore?     YES   T (tired) Are you often tired, fatigued, or sleepy during the day?   YES  O (obstruction) Do you stop breathing, choke, or gasp during sleep? NO   P (pressure) Do you have or are you being treated for high blood pressure? NO   B (BMI) Is your body index greater than 35 kg/m? YES   A (age) Are you 32 years old or older? NO   N (neck) Do you have a neck circumference greater than 16 inches?   NO   G (gender) Are you a female? NO   TOTAL STOP/BANG "YES" ANSWERS 3                                                               A STOP-Bang score  of 2 or less is considered low risk, and a score of 5 or more is high risk for having either moderate or severe OSA. For people who score 3 or 4, doctors may need to perform further assessment to determine how likely they are to have OSA.         EPWORTH SLEEPINESS SCALE:  Scale:  (0)= no chance of dozing; (1)= slight chance of dozing; (2)= moderate chance of dozing; (3)= high chance of dozing  Chance  Situtation    Sitting and reading: 2    Watching TV: 1    Sitting Inactive in public: 0    As a passenger in car: 1      Lying down to rest: 3    Sitting and talking: 0    Sitting quielty after lunch: 2    In a car, stopped in traffic: 1   TOTAL SCORE:   10 out of 24    SLEEP STUDIES:  None   LABS: Recent Results (from the past 2160 hour(s))  TSH     Status: None   Collection Time: 02/16/22  8:25 AM  Result Value Ref Range   TSH 3.29 mIU/L    Comment:           Reference Range .           > or = 20 Years  0.40-4.50 .                Pregnancy Ranges           First trimester    0.26-2.66           Second trimester   0.55-2.73           Third trimester    0.43-2.91     Radiology: MM 3D SCREEN BREAST BILATERAL  Result Date: 02/15/2022 CLINICAL DATA:  Screening. EXAM: DIGITAL SCREENING BILATERAL MAMMOGRAM WITH TOMOSYNTHESIS  AND CAD TECHNIQUE: Bilateral screening digital craniocaudal and mediolateral oblique mammograms were obtained. Bilateral screening digital breast tomosynthesis was performed. The images were evaluated with computer-aided detection. COMPARISON:  Previous exam(s). ACR Breast Density Category b: There are scattered areas of fibroglandular density. FINDINGS: There are no findings suspicious for malignancy. IMPRESSION: No mammographic evidence of malignancy. A result letter of this screening mammogram will be mailed directly to the patient. RECOMMENDATION: Screening mammogram in one year. (Code:SM-B-01Y) BI-RADS CATEGORY  1: Negative. Electronically  Signed   By: Bary Richard M.D.   On: 02/15/2022 15:42    No results found.  MM 3D SCREEN BREAST BILATERAL  Result Date: 02/15/2022 CLINICAL DATA:  Screening. EXAM: DIGITAL SCREENING BILATERAL MAMMOGRAM WITH TOMOSYNTHESIS AND CAD TECHNIQUE: Bilateral screening digital craniocaudal and mediolateral oblique mammograms were obtained. Bilateral screening digital breast tomosynthesis was performed. The images were evaluated with computer-aided detection. COMPARISON:  Previous exam(s). ACR Breast Density Category b: There are scattered areas of fibroglandular density. FINDINGS: There are no findings suspicious for malignancy. IMPRESSION: No mammographic evidence of malignancy. A result letter of this screening mammogram will be mailed directly to the patient. RECOMMENDATION: Screening mammogram in one year. (Code:SM-B-01Y) BI-RADS CATEGORY  1: Negative. Electronically Signed   By: Bary Richard M.D.   On: 02/15/2022 15:42      Assessment and Plan: Patient Active Problem List   Diagnosis Date Noted   At risk for nausea 10/11/2021   Prolonged menstruation 04/12/2021   Vertigo    Drug-induced constipation 05/24/2020   Other hyperlipidemia 05/24/2020   Insulin resistance 01/08/2020   Bipolar disorder, in full remission, most recent episode depressed (HCC) 12/23/2019   Heavy menstrual period 06/28/2019   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 01/01/2019   Bipolar II disorder, mild, depressed, with anxious distress (HCC) 09/05/2018   Insomnia due to mental condition 09/05/2018   Class 3 severe obesity with serious comorbidity and body mass index (BMI) of 40.0 to 44.9 in adult (HCC) 05/09/2018   Metabolic syndrome 10/27/2017   Acanthosis nigricans 10/22/2017   Moderate episode of recurrent major depressive disorder (HCC) 10/22/2017   Acid reflux 04/01/2017   Anxiety 06/01/2016   Morbid obesity with BMI of 40.0-44.9, adult (HCC) 03/12/2015   Peripheral neuropathy 10/09/2014    Hypothyroidism    IFG (impaired fasting glucose)    Vitamin D deficiency    Neuropathy      PLAN OSA:   Patient evaluation suggests high risk of sleep disordered breathing due to snoring, some trouble concentrating, brain fogginess, some fatigue, and elevated BMI. Patient has comorbid cardiovascular risk factors including: borderline elevated BP in office which could be exacerbated by pathologic sleep-disordered breathing.  Suggest: PSG to assess/treat the patient's sleep disordered breathing. The patient was also counselled on wt loss to optimize sleep health.   1. Hypersomnia Will order PSG  2. Other specified hypothyroidism Continue current medication and f/u with PCP.  3. Bipolar II disorder, mild, depressed, with anxious distress (HCC) Followed by psych  4. Morbid obesity with BMI of 40.0-44.9, adult (HCC) Obesity Counseling: Had a lengthy discussion regarding patients BMI and weight issues. Patient was instructed on portion control as well as increased activity. Also discussed caloric restrictions with trying to maintain intake less than 2000 Kcal. Discussions were made in accordance with the 5As of weight management. Simple actions such as not eating late and if able to, taking a walk is suggested.  5. Bariatric surgery status Will order PSG as part of bariatric surgery  evaluation   General Counseling: I have discussed the findings of the evaluation and examination with Kathryn Munoz.  I have also discussed any further diagnostic evaluation thatmay be needed or ordered today. Kathryn Munoz verbalizes understanding of the findings of todays visit. We also reviewed her medications today and discussed drug interactions and side effects including but not limited excessive drowsiness and altered mental states. We also discussed that there is always a risk not just to her but also people around her. she has been encouraged to call the office with any questions or concerns that should arise related  to todays visit.  No orders of the defined types were placed in this encounter.       I have personally obtained a history, evaluated the patient, evaluated pertinent data, formulated the assessment and plan and placed orders.  This patient was seen by Lynn Ito, PA-C in collaboration with Dr. Freda Munro as a part of collaborative care agreement.    Yevonne Pax, MD [facility] Diplomate ABMS Pulmonary and Critical Care Medicine Sleep medicine

## 2022-03-15 ENCOUNTER — Ambulatory Visit (INDEPENDENT_AMBULATORY_CARE_PROVIDER_SITE_OTHER): Payer: BC Managed Care – PPO | Admitting: Internal Medicine

## 2022-03-15 VITALS — BP 139/94 | HR 98 | Resp 18 | Ht 65.0 in | Wt 254.0 lb

## 2022-03-15 DIAGNOSIS — E038 Other specified hypothyroidism: Secondary | ICD-10-CM | POA: Diagnosis not present

## 2022-03-15 DIAGNOSIS — Z9884 Bariatric surgery status: Secondary | ICD-10-CM

## 2022-03-15 DIAGNOSIS — G4710 Hypersomnia, unspecified: Secondary | ICD-10-CM | POA: Diagnosis not present

## 2022-03-15 DIAGNOSIS — F3181 Bipolar II disorder: Secondary | ICD-10-CM

## 2022-03-15 DIAGNOSIS — Z6841 Body Mass Index (BMI) 40.0 and over, adult: Secondary | ICD-10-CM

## 2022-03-21 ENCOUNTER — Ambulatory Visit (INDEPENDENT_AMBULATORY_CARE_PROVIDER_SITE_OTHER): Payer: BC Managed Care – PPO | Admitting: Adult Health

## 2022-04-10 ENCOUNTER — Ambulatory Visit (INDEPENDENT_AMBULATORY_CARE_PROVIDER_SITE_OTHER): Payer: BC Managed Care – PPO | Admitting: Licensed Clinical Social Worker

## 2022-04-10 DIAGNOSIS — F3181 Bipolar II disorder: Secondary | ICD-10-CM

## 2022-04-10 DIAGNOSIS — Z634 Disappearance and death of family member: Secondary | ICD-10-CM | POA: Diagnosis not present

## 2022-04-11 NOTE — Progress Notes (Signed)
Virtual Visit via Video Note  I connected with Kathryn Munoz on 04/10/2022 at  2:00 PM EST by a video enabled telemedicine application and verified that I am speaking with the correct person using two identifiers.  Video connection was lost when less than 50% of the duration of the visit was complete, at which time the remainder of the visit was completed via audio only.  Location: Patient: home Provider: remote office [place], [place])   I discussed the limitations of evaluation and management by telemedicine and the availability of in person appointments. The patient expressed understanding and agreed to proceed.   I discussed the assessment and treatment plan with the patient. The patient was provided an opportunity to ask questions and all were answered. The patient agreed with the plan and demonstrated an understanding of the instructions.   The patient was advised to call back or seek an in-person evaluation if the symptoms worsen or if the condition fails to improve as anticipated.  I provided 50 minutes of non-face-to-face time during this encounter.  [place], LCSW  THERAPIST PROGRESS NOTE  Session Time: 2-245p  Participation Level: Active  Behavioral Response: Neat and Well GroomedAlertAnxious and Depressed  Type of Therapy: Individual Therapy  Treatment Goals addressed:Stabilize mood and increase goal-directed behavior  as evidenced by pt self report 3 out of 5 sessions documented.   ProgressTowards Goals: Progressing  Interventions: CBT and DBT  Summary: Kathryn Munoz is a 47 y.o. female who presents with improving symptoms related to bipolar disorder. Pt feels overall mood is stable and that she is managing situational stressors better since last session.   Allowed pt to explore and express thoughts and feelings associated with recent life situations and external stressors.Allowed pt to explore eating behaviors, and discuss behavior modifications pt is  currently working on. Pt is being intentional about choosing healthier choices of snacks, and overall quality of food. Discussed stress associated with her son--pt is worried that he is being exploited financially by friends and people that he has met on the internet. Pt states her son is 66 and she wants him to be independent and not seek out financial guardianship (pt states that her son has mild cognitive deficits). Allowed pt to explore different ways of managing situations including internet safety education, Discussed conflict management w/ husband re: financial concerns. Discussed self care and pt reports that she is spending quality time with family members and enjoys it when the  all get together and cook a "family style" meal together.   Continued recommendations are as follows: self care behaviors, positive social engagements, focusing on overall work/home/life balance, and focusing on positive physical and emotional wellness.    Suicidal/Homicidal: No  Therapist Response: Pt is continuing to apply interventions learned in session into daily life situations. Pt is currently on track to meet goals utilizing interventions mentioned above. Personal growth and progress noted. Treatment to continue as indicated.   Plan: Return again in 4 weeks.  Diagnosis:  Encounter Diagnoses  Name Primary?   Bipolar II disorder, mild, depressed, with anxious distress ([place]) Yes   Bereavement    Collaboration of Care: Other Pt encouraged to continue care with psychiatrist of record, Dr. Shea Evans  Patient/Guardian was advised Release of Information must be obtained prior to any record release in order to collaborate their care with an outside provider. Patient/Guardian was advised if they have not already done so to contact the registration department to sign all necessary forms in order for  [place] to release information regarding their care.   Consent: Patient/Guardian gives verbal consent for treatment and  assignment of benefits for services provided during this visit. Patient/Guardian expressed understanding and agreed to proceed.   [place], LCSW 04/11/2022

## 2022-04-12 NOTE — Progress Notes (Signed)
   Established Patient Office Visit  Subjective   Patient ID: Kathryn Munoz, female    DOB: [birth year]  Age: 46 y.o. MRN: [id number]  No chief complaint on file.   HPI  Hypothyroidism: -Medications: Levothyroxine increased to 100 mcg 6 weeks ago - labs normal but patient still symptomatic, especially regarding weight  -Patient is compliant with the above medication (s) at the above dose and reports no medication side effects. *** -Denies weight changes, cold./heat intolerance, skin changes, anxiety/palpitations *** -Last TSH: TSH 3.29 12/23   {History (Optional):23778}  ROS    Objective:     There were no vitals taken for this visit. {Vitals History (Optional):23777}  Physical Exam   No results found for any visits on 04/13/22.  {Labs (Optional):23779}  The 10-year ASCVD risk score (Arnett DK, et al., 2019) is: 1.3%    Assessment & Plan:   Problem List Items Addressed This Visit   None   No follow-ups on file.    Teodora Medici, DO

## 2022-04-13 ENCOUNTER — Ambulatory Visit: Payer: BC Managed Care – PPO | Admitting: Internal Medicine

## 2022-04-13 ENCOUNTER — Encounter: Payer: Self-pay | Admitting: Internal Medicine

## 2022-04-13 VITALS — BP 130/76 | HR 95 | Temp 98.0°F | Resp 16 | Ht 65.0 in | Wt 254.8 lb

## 2022-04-13 DIAGNOSIS — E039 Hypothyroidism, unspecified: Secondary | ICD-10-CM

## 2022-04-13 DIAGNOSIS — E88819 Insulin resistance, unspecified: Secondary | ICD-10-CM | POA: Diagnosis not present

## 2022-04-13 DIAGNOSIS — B372 Candidiasis of skin and nail: Secondary | ICD-10-CM | POA: Diagnosis not present

## 2022-04-13 MED ORDER — FLUCONAZOLE 150 MG PO TABS: 150.0000 mg | ORAL_TABLET | Freq: Once | ORAL | 0 refills | Status: AC

## 2022-04-14 LAB — THYROID PANEL WITH TSH
Free Thyroxine Index: 2.1 (ref 1.4–3.8)
T3 Uptake: 18 % — ABNORMAL LOW (ref 22–35)
T4, Total: 11.5 ug/dL (ref 5.1–11.9)
TSH: 1.84 mIU/L

## 2022-04-14 LAB — HEMOGLOBIN A1C
Hgb A1c MFr Bld: 6 % of total Hgb — ABNORMAL HIGH (ref <5.7)
Mean Plasma Glucose: 126 mg/dL
eAG (mmol/L): 7 mmol/L

## 2022-04-17 ENCOUNTER — Telehealth (INDEPENDENT_AMBULATORY_CARE_PROVIDER_SITE_OTHER): Payer: BC Managed Care – PPO | Admitting: Psychiatry

## 2022-04-17 ENCOUNTER — Encounter: Payer: Self-pay | Admitting: Psychiatry

## 2022-04-17 DIAGNOSIS — F5105 Insomnia due to other mental disorder: Secondary | ICD-10-CM | POA: Diagnosis not present

## 2022-04-17 DIAGNOSIS — F3176 Bipolar disorder, in full remission, most recent episode depressed: Secondary | ICD-10-CM | POA: Diagnosis not present

## 2022-04-17 DIAGNOSIS — F429 Obsessive-compulsive disorder, unspecified: Secondary | ICD-10-CM | POA: Diagnosis not present

## 2022-04-17 MED ORDER — LAMOTRIGINE 150 MG PO TABS: ORAL_TABLET | ORAL | 1 refills | Status: DC

## 2022-04-17 NOTE — Progress Notes (Signed)
Virtual Visit via Video Note  I connected with Kathryn Munoz on 04/17/22 at  1:30 PM EST by a video enabled telemedicine application and verified that I am speaking with the correct person using two identifiers.  Location Provider Location : [facility] Patient Location : Work  Participants: Patient , Provider   I discussed the limitations of evaluation and management by telemedicine and the availability of in person appointments. The patient expressed understanding and agreed to proceed.    I discussed the assessment and treatment plan with the patient. The patient was provided an opportunity to ask questions and all were answered. The patient agreed with the plan and demonstrated an understanding of the instructions.   The patient was advised to call back or seek an in-person evaluation if the symptoms worsen or if the condition fails to improve as anticipated.   BH MD OP Progress Note  04/17/2022 3:16 PM [name]  MRN:  [id number]  Chief Complaint:  Chief Complaint  Patient presents with   Follow-up   Medication Refill   Anxiety   Depression   HPI: Kathryn Munoz is a 47 year old African-American female, lives in [place], has a history of bipolar disorder, insomnia, insulin resistance, hypothyroidism, other specified eating disorder was evaluated by telemedicine today.  Patient today reports overall mood symptoms are stable.  Denies any significant mood swings, depression or anxiety symptoms.  Reports sleep has improved.  Reports has upcoming appointment for sleep study.  Reports appetite is fair.  Has made some changes with diet.  Trying to eat healthier.  Reports recently diagnosed with borderline diabetes/prediabetes.  Reports was diagnosed with insulin resistance in the past and has been taking metformin.  Planning to start an exercise routine.  Also has upcoming appointment for bariatric surgery.  Looks forward to that.  Patient reports she felt stressed out during the  holidays however was able to spend time with family who were supportive and that helped.  Reports work is going well.  Denies any suicidality, homicidality or perceptual disturbances.  Currently compliant on medications like Celexa, Lamictal, trazodone.  Denies side effects.  Patient denies any other concerns today.  Visit Diagnosis:    ICD-10-CM   1. Bipolar disorder, in full remission, most recent episode depressed (HCC)  F31.76    type II with anxious distress    2. Insomnia due to mental condition  F51.05    Mood    3. 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  F42.9 lamoTRIgine (LAMICTAL) 150 MG tablet      Past Psychiatric History: Reviewed past psych History from progress note on 11/08/2017.  Past trials of Celexa.  Past history of suicide attempt at the age of 48 by overdose on pills.  Another suicide attempt at the age of 20-slept through it, did not tell anyone at that time.  Past Medical History:  Past Medical History:  Diagnosis Date   Abnormal mammogram    breast biopsy, [facility] 2014   ADD (attention deficit disorder)    Anxiety    Bipolar 2 disorder (HCC)    Constipation    Depression    Dermatitis    Heavy menstrual bleeding    History of gestational diabetes    History of kidney stones 2014   Hypothyroidism    IFG (impaired fasting glucose)    Insulin resistance    Irregular periods    Joint pain    Multiple food allergies    Dates and nuts   Neuropathy  Obesity    OCD (obsessive compulsive disorder)    Prediabetes    Vitamin D deficiency disease     Past Surgical History:  Procedure Laterality Date   BREAST BIOPSY Left 2014   [place] bx/clip-neg   INTRAUTERINE DEVICE INSERTION  03/11/2012   TRIGGER FINGER RELEASE Right 04/21/2021    Family Psychiatric History: Reviewed family psychiatric history from progress note on 11/08/2017.  Family History:  Family History  Problem Relation Age of Onset   Diabetes Mother    Pancreatitis  Mother    Hypertension Mother    Alcohol abuse Mother    Schizophrenia Mother    Depression Mother    Heart disease Mother    Stroke Mother    Thyroid disease Mother    Kidney disease Mother    Anxiety disorder Mother    Alcohol abuse Father    Diabetes Sister        pre-diabetic   Diabetes Brother    Drug abuse Brother    ADD / ADHD Son    Eczema Son    Breast cancer Cousin        pat cousin   Anxiety disorder Other    Ovarian cancer Neg Hx     Social History: Reviewed social history from progress note on 11/08/2017. Social History   Socioeconomic History   Marital status: Married    Spouse name: Kailin Principato   Number of children: 2   Years of education: Not on file   Highest education level: Bachelor's degree (e.g., BA, AB, BS)  Occupational History   Occupation: English as a second language teacher  Tobacco Use   Smoking status: Never   Smokeless tobacco: Never  Vaping Use   Vaping Use: Never used  Substance and Sexual Activity   Alcohol use: Yes    Comment: Ocassionally   Drug use: No   Sexual activity: Yes    Partners: Male    Birth control/protection: Inserts    Comment: nuvaring  Other Topics Concern   Not on file  Social History Narrative   Not on file   Social Determinants of Health   Financial Resource Strain: Low Risk  (11/22/2021)   Overall Financial Resource Strain (CARDIA)    Difficulty of Paying Living Expenses: Not hard at all  Food Insecurity: No Food Insecurity (11/22/2021)   Hunger Vital Sign    Worried About Running Out of Food in the Last Year: Never true    Ran Out of Food in the Last Year: Never true  Transportation Needs: No Transportation Needs (11/22/2021)   PRAPARE - Hydrologist (Medical): No    Lack of Transportation (Non-Medical): No  Physical Activity: Inactive (11/22/2021)   Exercise Vital Sign    Days of Exercise per Week: 0 days    Minutes of Exercise per Session: 0 min  Stress: Stress Concern Present (11/22/2021)   [place]    Feeling of Stress : To some extent  Social Connections: Socially Integrated (11/22/2021)   Social Connection and Isolation Panel [NHANES]    Frequency of Communication with Friends and Family: More than three times a week    Frequency of Social Gatherings with Friends and Family: More than three times a week    Attends Religious Services: 1 to 4 times per year    Active Member of [employer] or Organizations: Yes    Attends Archivist Meetings: 1 to 4 times per year  Marital Status: Married    Allergies:  Allergies  Allergen Reactions   Date Seed Extract  [Zizyphus Jujuba] Anaphylaxis, Cough, Itching and Swelling   Other Swelling    Metabolic Disorder Labs: Lab Results  Component Value Date   HGBA1C 6.0 (H) 04/13/2022   MPG 126 04/13/2022   MPG 117 03/31/2019   No results found for: "PROLACTIN" Lab Results  Component Value Date   CHOL 170 06/01/2021   TRIG 163 (H) 06/01/2021   HDL 53 06/01/2021   CHOLHDL 3.2 06/01/2021   VLDL 14 10/16/2016   LDLCALC 89 06/01/2021   LDLCALC 93 05/04/2020   Lab Results  Component Value Date   TSH 1.84 04/13/2022   TSH 3.29 02/16/2022    Therapeutic Level Labs: No results found for: "LITHIUM" No results found for: "VALPROATE" No results found for: "CBMZ"  Current Medications: Current Outpatient Medications  Medication Sig Dispense Refill   citalopram (CELEXA) 20 MG tablet Take 1 tablet (20 mg total) by mouth daily. 90 tablet 1   EPINEPHrine 0.3 mg/0.3 mL IJ SOAJ injection INJECT 0.3 MLS INTO THE MUSCLE ONCE FOR 1 DOSE. FOR LIFE-THREATENING ALLERGIC REACTION / ANAPHYLAXIS  1   etonogestrel-ethinyl estradiol (NUVARING) 0.12-0.015 MG/24HR vaginal ring PLACE 1 EACH VAGINALLY EVERY 28 (TWENTY-EIGHT) DAYS. INSERT VAGINALLY AND LEAVE IN PLACE FOR 3 CONSECUTIVE WEEKS, THEN REMOVE FOR 1 WEEK. 1 each 11   hydrOXYzine (VISTARIL) 25 MG capsule TAKE 1-3 CAPSULES (25-75  MG TOTAL) BY MOUTH AT BEDTIME AS NEEDED. SLEEP AND ANXIETY 270 capsule 1   lamoTRIgine (LAMICTAL) 25 MG tablet Take 1 tablet (25 mg total) by mouth daily. Take along with 150 mg - total of 175 mg 90 tablet 0   levothyroxine (SYNTHROID) 100 MCG tablet Take 1 tablet (100 mcg total) by mouth daily. Follow up in office in 6-8 weeks for labs/recheck for more refills 60 tablet 0   linaclotide (LINZESS) 145 MCG CAPS capsule TAKE 1 CAPSULE BY MOUTH EVERY DAY BEFORE BREAKFAST 90 capsule 3   metFORMIN (GLUCOPHAGE-XR) 500 MG 24 hr tablet Take 3 tablets (1,500 mg total) by mouth daily. 270 tablet 3   nystatin (MYCOSTATIN/NYSTOP) powder Apply 1 Application topically 3 (three) times daily. 45 g 0   ondansetron (ZOFRAN) 4 MG tablet Take 1 tablet (4 mg total) by mouth every 8 (eight) hours as needed for nausea or vomiting. 20 tablet 1   traZODone (DESYREL) 50 MG tablet Take 1-2 tablets (50-100 mg total) by mouth at bedtime as needed for sleep. 180 tablet 0   Vitamin D, Ergocalciferol, (DRISDOL) 1.25 MG (50000 UNIT) CAPS capsule Take 1 capsule (50,000 Units total) by mouth every 7 (seven) days. 4 capsule 0   lamoTRIgine (LAMICTAL) 150 MG tablet TAKE 1 TABLET (150 MG TOTAL) BY MOUTH DAILY. TAKE ALONG WITH 25 MG DAILY 90 tablet 1   No current facility-administered medications for this visit.     Musculoskeletal: Strength & Muscle Tone:  UTA Gait & Station:  Seated Patient leans: N/A  Psychiatric Specialty Exam: Review of Systems  Psychiatric/Behavioral:  Positive for sleep disturbance.   All other systems reviewed and are negative.   There were no vitals taken for this visit.There is no height or weight on file to calculate BMI.  General Appearance: Casual  Eye Contact:  Fair  Speech:  Clear and Coherent  Volume:  Normal  Mood:  Euthymic  Affect:  Congruent  Thought Process:  Goal Directed and Descriptions of Associations: Intact  Orientation:  Full (Time, Place, and  Person)  Thought Content: Logical    Suicidal Thoughts:  No  Homicidal Thoughts:  No  Memory:  Immediate;   Fair Recent;   Fair Remote;   Fair  Judgement:  Fair  Insight:  Fair  Psychomotor Activity:  Normal  Concentration:  Concentration: Fair and Attention Span: Fair  Recall:  [employer] of Knowledge: Fair  Language: Fair  Akathisia:  No  Handed:  Right  AIMS (if indicated): not done  Assets:  Communication Skills Desire for Improvement Housing Social Support  ADL's:  Intact  Cognition: WNL  Sleep:   Improving   Screenings: AIMS    Flowsheet Row Video Visit from 10/24/2021 in [place] Video Visit from 07/27/2021 in [place] Video Visit from 10/28/2020 in [place] Total Score 0 0 0      GAD-7    [place] Office Visit from 04/13/2022 in [facility] Video Visit from 02/23/2022 in [place] Office Visit from 11/22/2021 in [facility] Video Visit from 11/17/2021 in [place] Video Visit from 07/27/2021 in [place]  Total GAD-7 Score 0 11 0 3 2      PHQ2-9    [place] Video Visit from 04/17/2022 in [place] Office Visit from 04/13/2022 in [facility] Video Visit from 02/23/2022 in [place] Office Visit from 02/16/2022 in [facility] Office Visit from 11/22/2021 in [facility]  PHQ-2 Total Score 0 0 2 2 0  PHQ-9 Total Score -- 0 5 2 0      Flowsheet Row Video Visit from 04/17/2022 in [place] Video Visit from 02/23/2022 in [place] Counselor from  02/21/2022 in [place] at [place]        Assessment and Plan: Kathryn Munoz is a 47 year old African-American female, married, employed, lives in [place], has a history of bipolar disorder, sleep problem, OCD, hypothyroidism was evaluated by telemedicine today.  Patient is currently improving, will benefit from the following plan.  Plan Bipolar disorder type II depressed with anxious distressed in remission Lamotrigine 175 mg p.o. daily Celexa 20 mg p.o. daily-dose reduced due to sexual dysfunction. Continue CBT with Ms. Christina Hussami  OCD-stable Continue CBT with Ms. Christina Celexa 20 mg p.o. daily  Insomnia-improving Trazodone 50-100 mg at bedtime Hydroxyzine 25 to 75 mg at bedtime as needed Continue sleep hygiene techniques Patient has upcoming sleep study.  Discussed diet management, exercise, lifestyle modification.  Follow-up in clinic in 2 to 3 months or sooner if needed.   Collaboration of Care: Collaboration of Care: Referral or follow-up with counselor/therapist AEB patient encouraged to continue CBT.  Patient/Guardian was advised Release of Information must be obtained prior to any record release in order to collaborate their care with an outside provider. Patient/Guardian was advised if they have not already done so to contact the registration department to sign all necessary forms in order for [place] to release information regarding their care.   Consent: Patient/Guardian gives verbal consent for treatment and assignment of benefits for services provided during this visit. Patient/Guardian expressed understanding and agreed to proceed.   This note  was generated in part or whole with voice recognition software. Voice recognition is usually quite accurate but there are transcription errors that can and very often do occur. I apologize for any typographical errors that were not detected  and corrected.      Jomarie Longs, MD 04/17/2022, 3:16 PM

## 2022-05-01 ENCOUNTER — Other Ambulatory Visit: Payer: Self-pay | Admitting: Family Medicine

## 2022-05-01 DIAGNOSIS — E039 Hypothyroidism, unspecified: Secondary | ICD-10-CM

## 2022-05-15 ENCOUNTER — Ambulatory Visit (INDEPENDENT_AMBULATORY_CARE_PROVIDER_SITE_OTHER): Payer: BC Managed Care – PPO | Admitting: Licensed Clinical Social Worker

## 2022-05-15 DIAGNOSIS — Z634 Disappearance and death of family member: Secondary | ICD-10-CM | POA: Diagnosis not present

## 2022-05-15 DIAGNOSIS — F3181 Bipolar II disorder: Secondary | ICD-10-CM | POA: Diagnosis not present

## 2022-05-15 NOTE — Progress Notes (Unsigned)
Virtual Visit via Video Note  I connected with Kathryn Munoz on 04/10/2022 at  2:00 PM EST by a video enabled telemedicine application and verified that I am speaking with the correct person using two identifiers.  Video connection was lost when less than 50% of the duration of the visit was complete, at which time the remainder of the visit was completed via audio only.  Location: Patient: home Provider: remote office [place], [place])   I discussed the limitations of evaluation and management by telemedicine and the availability of in person appointments. The patient expressed understanding and agreed to proceed.   I discussed the assessment and treatment plan with the patient. The patient was provided an opportunity to ask questions and all were answered. The patient agreed with the plan and demonstrated an understanding of the instructions.   The patient was advised to call back or seek an in-person evaluation if the symptoms worsen or if the condition fails to improve as anticipated.  I provided 50 minutes of non-face-to-face time during this encounter.  [place], LCSW  THERAPIST PROGRESS NOTE  Session Time: 2-245p  Participation Level: Active  Behavioral Response: Neat and Well GroomedAlertAnxious and Depressed  Type of Therapy: Individual Therapy  Treatment Goals addressed:Stabilize mood and increase goal-directed behavior  as evidenced by pt self report 3 out of 5 sessions documented.   ProgressTowards Goals: Progressing  Interventions: CBT and DBT  Summary: Kathryn Munoz is a 47 y.o. female who presents with improving symptoms related to bipolar disorder. Pt feels overall mood is stable and that she is managing situational stressors better since last session.   --focus/concentration better, time management good.  --sleep: doing better than in the past. Compliant w/ psych meds.   --going out w/ girls on the weekend. Stressed/didn't know what to do. Felt  a sense of abandonment. I did talked to [facility] and he told me to stay w/ friend. Able to talk myself to talk through it.  --BIL father passed and that may be a trigger. Anniversarry of moms passign march 15--thinking about that.   Allowed pt to explore and express thoughts and feelings associated with recent life situations and external stressors.  --boundaries--sister and her husband will understand.   --birthday: had a great day. Went to the [place] and sang happy birthday.   --sleep study for bariatric surgery prep. Went back to my doctor and she was disapointed in my progress. Not eating and doing the things I was supposed to do. Have to go back in a month  --honestly I'm not on board with that.  Its not will power....its a fear of failure.   --physically: following up on thyroid   --found out I'm pre diabetic.  Gad GD 15 years ago.  A1C jumped high. 6.0.  disappointing.   --using fitness pal: tells me that I've already eaten my calories for today.  Need to get moving. Exercise and portion control.   --new job is even more sedantary and played a part.   --life is active. --asked me to cut down my son's activities.   --spending quality time with husband: homes by 7:30-8 at night. Obsessive thoughts--trigger: having dreams about husband having an affair.   --regulating emotions  --thought stopping--challenging thoughts  --son: he is very well rounded--honor roll and basketball to baseball. Very excited. Playing for high school. [employer] aware/jazz band. [facility] [employer] sax/drums/vibrophone, piano.   Continued recommendations are as follows: self care behaviors, positive social engagements, focusing on overall work/home/life  balance, and focusing on positive physical and emotional wellness.    Suicidal/Homicidal: No  Therapist Response: Pt is continuing to apply interventions learned in session into daily life situations. Pt is currently on track to meet goals utilizing interventions mentioned  above. Personal growth and progress noted. Treatment to continue as indicated.   Plan: Return again in 4 weeks.  Diagnosis:  No diagnosis found.  Collaboration of Care: Other Pt encouraged to continue care with psychiatrist of record, Dr. Shea Evans  Patient/Guardian was advised Release of Information must be obtained prior to any record release in order to collaborate their care with an outside provider. Patient/Guardian was advised if they have not already done so to contact the registration department to sign all necessary forms in order for [place] to release information regarding their care.   Consent: Patient/Guardian gives verbal consent for treatment and assignment of benefits for services provided during this visit. Patient/Guardian expressed understanding and agreed to proceed.   [place], LCSW 05/15/2022

## 2022-05-29 ENCOUNTER — Encounter: Payer: Self-pay | Admitting: Obstetrics and Gynecology

## 2022-05-29 ENCOUNTER — Encounter: Payer: Self-pay | Admitting: Internal Medicine

## 2022-05-31 ENCOUNTER — Other Ambulatory Visit: Payer: Self-pay | Admitting: Psychiatry

## 2022-05-31 DIAGNOSIS — F5105 Insomnia due to other mental disorder: Secondary | ICD-10-CM

## 2022-06-05 ENCOUNTER — Ambulatory Visit (INDEPENDENT_AMBULATORY_CARE_PROVIDER_SITE_OTHER): Payer: BC Managed Care – PPO | Admitting: Advanced Practice Midwife

## 2022-06-05 VITALS — BP 141/87 | HR 68 | Ht 64.5 in | Wt 250.5 lb

## 2022-06-05 DIAGNOSIS — Z30430 Encounter for insertion of intrauterine contraceptive device: Secondary | ICD-10-CM

## 2022-06-05 MED ORDER — LEVONORGESTREL 20 MCG/DAY IU IUD
1.0000 | INTRAUTERINE_SYSTEM | Freq: Once | INTRAUTERINE | Status: AC
  Administered 2022-06-05: 1 via INTRAUTERINE

## 2022-06-06 ENCOUNTER — Encounter: Payer: Self-pay | Admitting: Advanced Practice Midwife

## 2022-06-08 ENCOUNTER — Ambulatory Visit (INDEPENDENT_AMBULATORY_CARE_PROVIDER_SITE_OTHER): Payer: BC Managed Care – PPO | Admitting: Licensed Clinical Social Worker

## 2022-06-08 DIAGNOSIS — F3181 Bipolar II disorder: Secondary | ICD-10-CM

## 2022-06-08 DIAGNOSIS — F429 Obsessive-compulsive disorder, unspecified: Secondary | ICD-10-CM | POA: Diagnosis not present

## 2022-06-08 DIAGNOSIS — Z634 Disappearance and death of family member: Secondary | ICD-10-CM

## 2022-06-08 NOTE — Progress Notes (Signed)
Virtual Visit via Video Note  I connected with Kathryn Munoz on 06/08/22 at  2:00 PM EDT by a video enabled telemedicine application and verified that I am speaking with the correct person using two identifiers.  Video connection was lost when less than 50% of the duration of the visit was complete, at which time the remainder of the visit was completed via audio only.  Location: Patient: home Provider: remote office [place], [place])   I discussed the limitations of evaluation and management by telemedicine and the availability of in person appointments. The patient expressed understanding and agreed to proceed.   I discussed the assessment and treatment plan with the patient. The patient was provided an opportunity to ask questions and all were answered. The patient agreed with the plan and demonstrated an understanding of the instructions.   The patient was advised to call back or seek an in-person evaluation if the symptoms worsen or if the condition fails to improve as anticipated.  I provided 45 minutes of non-face-to-face time during this encounter.  [place], LCSW  THERAPIST PROGRESS NOTE  Session Time: 2-245p  Participation Level: Active  Behavioral Response: Neat and Well GroomedAlertAnxious and Depressed  Type of Therapy: Individual Therapy  Treatment Goals addressed:Stabilize mood and increase goal-directed behavior  as evidenced by pt self report 3 out of 5 sessions documented.   ProgressTowards Goals: Progressing  Interventions: CBT and DBT  Summary: Kathryn Munoz is a 47 y.o. female who presents with improving symptoms related to bipolar disorder.  Patient reports that she has been compliant with her medications and compliant with her psychiatric appointments.  Patient reports good quality and quantity of sleep.  Allowed pt to explore and express thoughts and feelings associated with recent life situations and external stressors.  Patient reports  that she is experiencing some anxiety over recent obsessive/preoccupational  thoughts involving fears associated with her husband cheating.  Patient reports that she is aware that her husband is not cheating, but patient cannot stop the intrusive thoughts.  Patient reports that she is engaging in compulsive behaviors including checking/reading his emails and checking his phone.  Patient reports that she was late for work 1 day because she had to check his phone for going to work.  Patient states that she has talked to her husband about it, and that he is very supportive.  Patient states that her husband made the statement that he feels this was all triggered by the recent anniversary of her mother's death.  Patient states that she feels that she is in the acceptance stage of grief and does not feel that it is related.  Reminded patient about the body holding onto trauma, and discussed the book " the body keeps the score".  Patient states that she has this book--encouraged patient to review some of the chapters about anniversaries and physiological responses.  Allow patient to identify thoughts, triggers, and feelings associated with the intrusive thoughts.  Encouraged patient to challenge thoughts, and to use the tools on her CBT app.  Patient states that she feels that this is all associated with a fear of abandonment that patient felt when her mother died.  Reviewed coping skills for managing stress, managing anxiety, and managing depression.  Patient agrees that she is going to stop drinking until she gets through this stressful episode.  Patient states that she is not drinking too much, but patient drank alcohol and then took her nightly medication, which patient felt was reckless when  she thought about it the following day.  Continued recommendations are as follows: self care behaviors, positive social engagements, focusing on overall work/home/life balance, and focusing on positive physical and  emotional wellness.    Suicidal/Homicidal: No  Therapist Response: Pt is continuing to apply interventions learned in session into daily life situations. Pt is currently on track to meet goals utilizing interventions mentioned above. Personal growth and progress noted. Treatment to continue as indicated.   Patient is continuing to identify specific barriers to success, identify triggers for specific thoughts and feelings, and is continuing to manage overall grief, mood, and stress.  Plan: Return again in 4 weeks.  Diagnosis:  Encounter Diagnoses  Name Primary?   Bipolar II disorder, mild, depressed, with anxious distress ([place]) Yes   Bereavement    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     Collaboration of Care: Other Pt encouraged to continue care with psychiatrist of record, Dr. Shea Evans  Patient/Guardian was advised Release of Information must be obtained prior to any record release in order to collaborate their care with an outside provider. Patient/Guardian was advised if they have not already done so to contact the registration department to sign all necessary forms in order for [place] to release information regarding their care.   Consent: Patient/Guardian gives verbal consent for treatment and assignment of benefits for services provided during this visit. Patient/Guardian expressed understanding and agreed to proceed.   [place], LCSW 06/08/2022

## 2022-06-08 NOTE — Progress Notes (Signed)
[facility]  Date of Service: 06/05/2022  GYNECOLOGY OFFICE PROCEDURE NOTE  ABBAGAIL HILINSKI is a 47 y.o. [phone] here for Mirena IUD insertion. No GYN concerns.  Last pap smear was on 11/17/20 and was normal.  The patient is currently using NuvaRing for contraception and her LMP is Patient's last menstrual period was 05/16/2022 (approximate)..  The indication for her IUD is contraception/cycle control. She discussed using Mirena for cycle control with Dr Amalia Hailey due to her heavy cycles.   Review of Systems  Constitutional:  Negative for chills and fever.  HENT:  Negative for congestion, ear discharge, ear pain, hearing loss, sinus pain and sore throat.   Eyes:  Negative for blurred vision and double vision.  Respiratory:  Negative for cough, shortness of breath and wheezing.   Cardiovascular:  Negative for chest pain, palpitations and leg swelling.  Gastrointestinal:  Negative for abdominal pain, blood in stool, constipation, diarrhea, heartburn, melena, nausea and vomiting.  Genitourinary:  Negative for dysuria, flank pain, frequency, hematuria and urgency.  Musculoskeletal:  Negative for back pain, joint pain and myalgias.  Skin:  Negative for itching and rash.  Neurological:  Negative for dizziness, tingling, tremors, sensory change, speech change, focal weakness, seizures, loss of consciousness, weakness and headaches.  Endo/Heme/Allergies:  Negative for environmental allergies. Does not bruise/bleed easily.  Psychiatric/Behavioral:  Negative for depression, hallucinations, memory loss, substance abuse and suicidal ideas. The patient is not nervous/anxious and does not have insomnia.    Vital Signs: BP (!) 141/87   Pulse 68   Ht 5' 4.5" (1.638 m)   Wt 250 lb 8 oz (113.6 kg)   LMP 05/16/2022 (Approximate)   BMI 42.33 kg/m  Constitutional: Well nourished, well developed female in no acute distress.  HEENT: normal Skin: Warm and dry.  Cardiovascular: Regular rate and rhythm.    Respiratory: Clear to auscultation bilateral. Normal respiratory effort Psych: Alert and Oriented x3. No memory deficits. Normal mood and affect.    Pelvic exam: (female chaperone present) is not limited by body habitus EGBUS: within normal limits Vagina: within normal limits and with normal mucosa  Cervix: normal appearance   IUD Insertion Procedure Note Patient identified, informed consent performed, consent signed.   Discussed risks of irregular bleeding, cramping, infection, malpositioning, expulsion or uterine perforation of the IUD (1:1000 placements)  which may require further procedure such as laparoscopy.  IUD while effective at preventing pregnancy do not prevent transmission of sexually transmitted diseases and use of barrier methods for this purpose was discussed. Time out was performed.    Speculum placed in the vagina.  Cervix visualized.  Cleaned with Betadine x 2.  Grasped anteriorly with a single tooth tenaculum.  Uterus sounded to 8.25 cm. IUD placed per manufacturer's recommendations.  Strings trimmed to 3 cm. Tenaculum was removed, good hemostasis noted.  Patient tolerated procedure well.   Patient was given post-procedure instructions.  She was advised to have backup contraception for one week.  Patient was also asked to check IUD strings periodically and follow up in 4-6 weeks for IUD check.  IUD insertion CPT 58300,  Skyla [id number] Mirena [id number] Liletta J7297 Paraguard J7300 Verdia Kuba [id number] Modifer 25, plus Modifer 79 is done during a global billing visit    Rod Can, [facility]  06/08/2022, 11:55 PM

## 2022-06-09 ENCOUNTER — Encounter: Payer: Self-pay | Admitting: Advanced Practice Midwife

## 2022-07-18 ENCOUNTER — Ambulatory Visit (INDEPENDENT_AMBULATORY_CARE_PROVIDER_SITE_OTHER): Payer: BC Managed Care – PPO

## 2022-07-18 ENCOUNTER — Ambulatory Visit: Payer: BC Managed Care – PPO | Admitting: Psychiatry

## 2022-07-18 ENCOUNTER — Ambulatory Visit: Payer: BC Managed Care – PPO | Admitting: Advanced Practice Midwife

## 2022-07-18 ENCOUNTER — Encounter: Payer: Self-pay | Admitting: Advanced Practice Midwife

## 2022-07-18 VITALS — BP 114/72 | HR 69 | Wt 246.6 lb

## 2022-07-18 DIAGNOSIS — T8332XA Displacement of intrauterine contraceptive device, initial encounter: Secondary | ICD-10-CM | POA: Diagnosis not present

## 2022-07-18 NOTE — Progress Notes (Signed)
  [facility]  IUD String Check  Subjctive: [name] presents for IUD string check.  She had a Mirena placed 6 weeks ago.  Since placement of her IUD she had heavy vaginal bleeding.  She denies cramping or discomfort.  She has had intercourse since placement.  She has not checked the strings.  She denies any fever, chills, nausea, vomiting, or other complaints.    Objective: BP 114/72   Pulse 69   Wt 246 lb 9.6 oz (111.9 kg)   BMI 41.68 kg/m  Gen:  NAD, A&Ox3 HEENT: normocephalic, anicteric Pulmonary: no increased work of breathing Pelvic: Normal appearing external female genitalia, normal vaginal epithelium, no abnormal discharge. Normal appearing cervix.  IUD strings not visible Psychiatric: mood appropriate, affect full Neurologic: grossly normal  IUD seen on transvaginal ultrasound and per ultrasonographer appears to be in the correct location  Assessment: 47 y.o. year old female status post prior Mirena IUD placement 6 weeks ago, doing well.  Plan: 1.  The patient was given instructions to check her IUD strings monthly and call with any problems or concerns.  She should call for fevers, chills, abnormal vaginal discharge, pelvic pain, or other complaints. 2.  She will return for an annual exam in 1 year.  All questions answered.   Tresea Mall, CNM 07/18/2022 4:15 PM

## 2022-07-27 ENCOUNTER — Telehealth (INDEPENDENT_AMBULATORY_CARE_PROVIDER_SITE_OTHER): Payer: BC Managed Care – PPO | Admitting: Licensed Clinical Social Worker

## 2022-07-27 ENCOUNTER — Encounter (HOSPITAL_COMMUNITY): Payer: Self-pay | Admitting: Licensed Clinical Social Worker

## 2022-07-27 DIAGNOSIS — F319 Bipolar disorder, unspecified: Secondary | ICD-10-CM | POA: Diagnosis not present

## 2022-07-27 DIAGNOSIS — F3181 Bipolar II disorder: Secondary | ICD-10-CM

## 2022-07-27 NOTE — Progress Notes (Signed)
Virtual Visit via Video Note  I connected with Kathryn Munoz on 07/27/22 at  1:00 PM EDT by a video enabled telemedicine application and verified that I am speaking with the correct person using two identifiers.  Video connection was lost when less than 50% of the duration of the visit was complete, at which time the remainder of the visit was completed via audio only.  Location: Patient: home Provider: [facility]   I discussed the limitations of evaluation and management by telemedicine and the availability of in person appointments. The patient expressed understanding and agreed to proceed.   I discussed the assessment and treatment plan with the patient. The patient was provided an opportunity to ask questions and all were answered. The patient agreed with the plan and demonstrated an understanding of the instructions.   The patient was advised to call back or seek an in-person evaluation if the symptoms worsen or if the condition fails to improve as anticipated.  I provided 45 minutes of non-face-to-face time during this encounter.  Jaymi Tinner R Kou Gucciardo, LCSW  THERAPIST PROGRESS NOTE  Session Time: 1-145p  Participation Level: Active  Behavioral Response: Neat and Well GroomedAlertAnxious and Depressed  Type of Therapy: Individual Therapy  Treatment Goals addressed:Stabilize mood and increase goal-directed behavior  as evidenced by pt self report 3 out of 5 sessions documented.   ProgressTowards Goals: Progressing  Interventions: CBT and DBT  Summary: Kathryn Munoz is a 47 y.o. female who presents with improving symptoms related to bipolar disorder.  Patient reports that she has been compliant with her medications and compliant with her psychiatric appointments.  Patient reports good quality and quantity of sleep.   Assisted pt with identifying anxiety triggers. Discussed importance of pushing through the avoidance response and to use  coping skills to manage any feelings of minor distress. Allowed pt to explore and identify any limits or boundaries that could be helpful with managing stress/anxiety triggers. Reviewed importance of emotion regulation.    Explored patients overall focus on wellness. Discussed emotional wellness and included discussions about improving overall work/life balance and improvements in self care.Reviewed current nutritional choices and reviewed healthy nutritional choices. Allowed pt to explore what changes and improvements that they feel would be good personal goals without feeling too overwhelmed. Pt committed to change. Pt approved for bariatric surgery--requested support letter from clinician. Pt feels good that she is managing her health and has reversed some prediabetes symptoms.   Clinician and pt explored patient expectations and reality. Discussed similarities and differences--and discussed psychological impact. Allowed pt to identify what elements that they have the power to change. Discussed work, relationships.  Assisted pt with emotion regulation. Pt identified relationship w/ husband as a recent emotional trigger. Allowed pt to explore details about schema, and discussed outcome of scenario (lashing out, throwing things, yelling).. Allowed pt to identify coping skills that have been helpful in the past. Discussed impulsive behaviors and how pt can sometimes delay/manage impulsivity.   Pt identifies triggers as:  1.  Being dismissed by others 2.  Feeling that others are not following through for her. Pt feels these are fueled by her mother always being late and "not there for her" during childhood. Pt reports that she has been getting angry and breaking things since childhood.  Continued recommendations are as follows: self care behaviors, positive social engagements, focusing on overall work/home/life balance, and focusing on positive physical and emotional wellness.    Suicidal/Homicidal:  No  Therapist Response:  Pt is continuing to apply interventions learned in session into daily life situations. Pt is currently on track to meet goals utilizing interventions mentioned above. Personal growth and progress noted. Treatment to continue as indicated.   Patient is continuing to identify specific barriers to success, identify triggers for specific thoughts and feelings, and is continuing to manage overall grief, mood, and stress.  Plan: Return again in 4 weeks.  Diagnosis:  No diagnosis found.   Collaboration of Care: Other Pt encouraged to continue care with psychiatrist of record, Dr. Elna Breslow  Patient/Guardian was advised Release of Information must be obtained prior to any record release in order to collaborate their care with an outside provider. Patient/Guardian was advised if they have not already done so to contact the registration department to sign all necessary forms in order for [place] to release information regarding their care.   Consent: Patient/Guardian gives verbal consent for treatment and assignment of benefits for services provided during this visit. Patient/Guardian expressed understanding and agreed to proceed.   Ernest Haber Shenise Wolgamott, LCSW 07/27/2022

## 2022-07-29 IMAGING — MG DIGITAL SCREENING BILAT W/ TOMO W/ CAD
6 of 10 series · 6 of 30 positions shown · non-contrast
Comparison: Previous exam(s).

CLINICAL DATA: Screening.

EXAM:
DIGITAL SCREENING BILATERAL MAMMOGRAM WITH TOMO AND CAD

[R MLO synth-2D (1 of 2)]
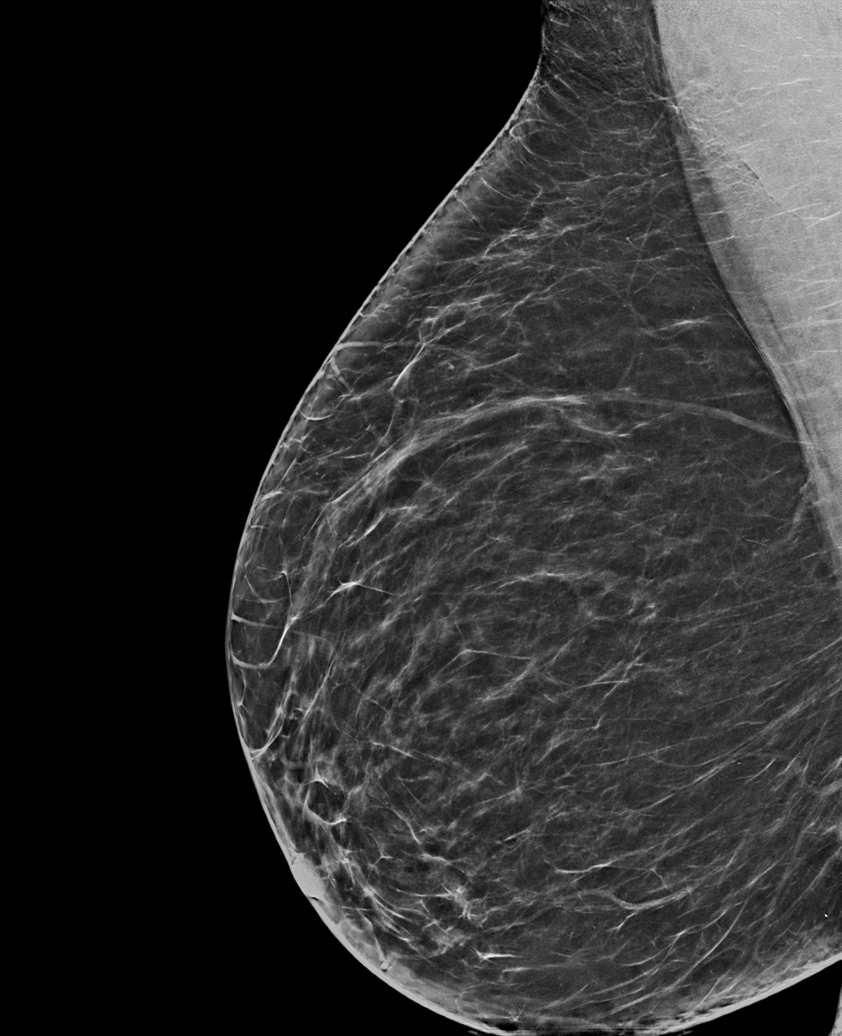

[R MLO synth-2D (2 of 2)]
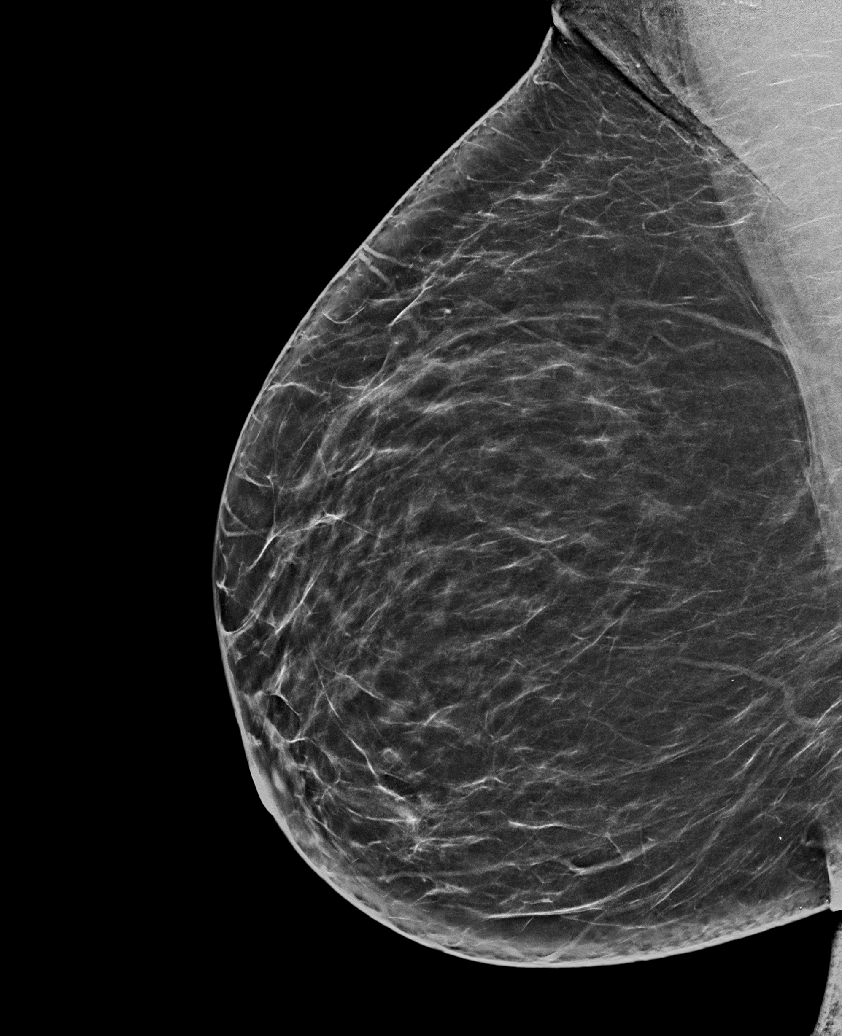

[R CC synth-2D]
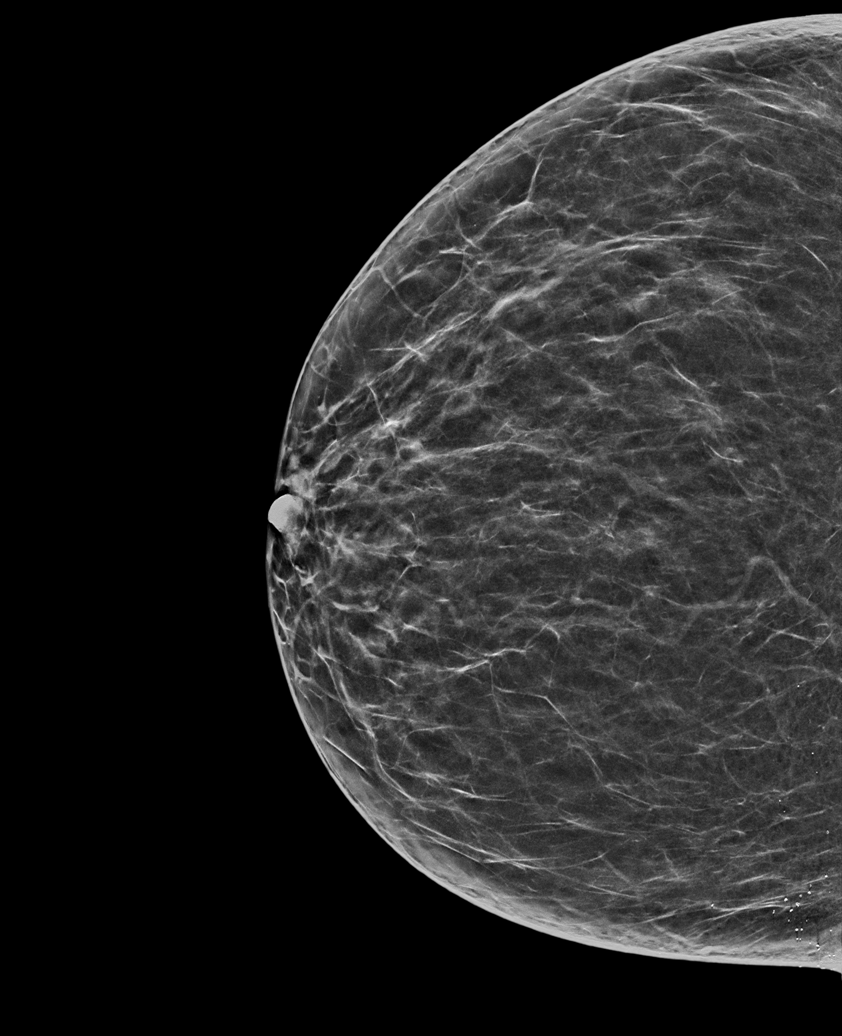

[L MLO synth-2D]
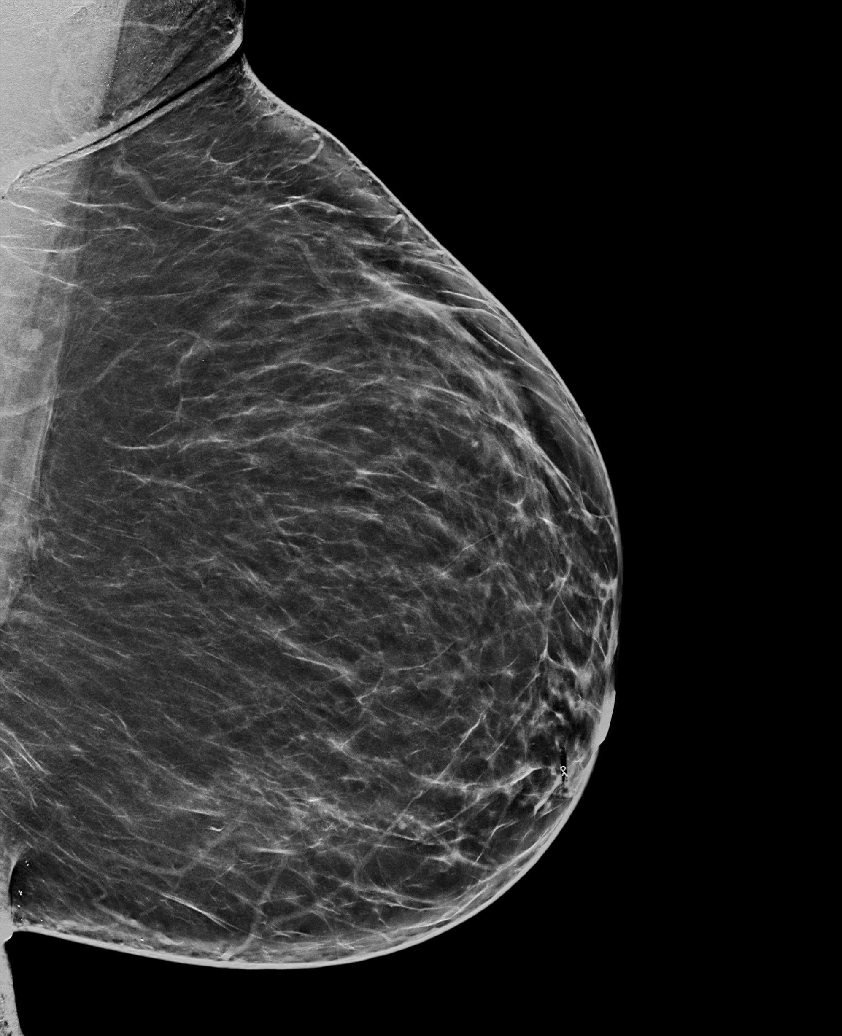

[L CC synth-2D]
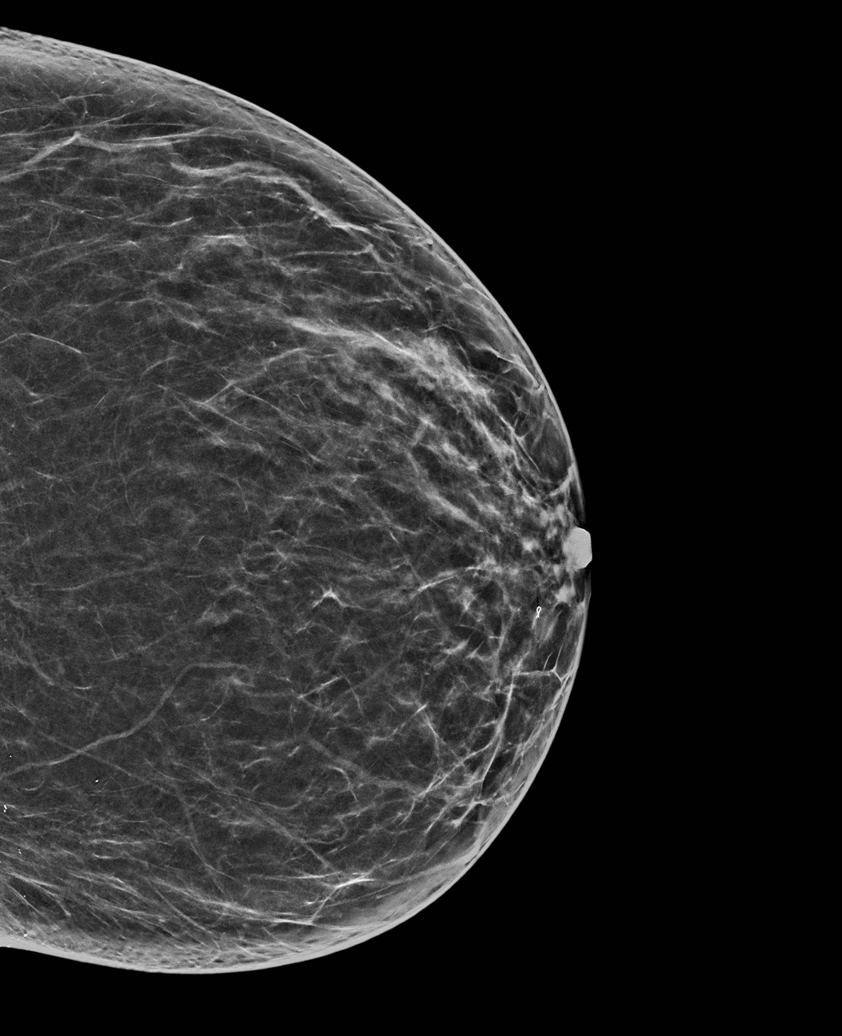

[R MLO tomo · tomo slice 41/80.0]
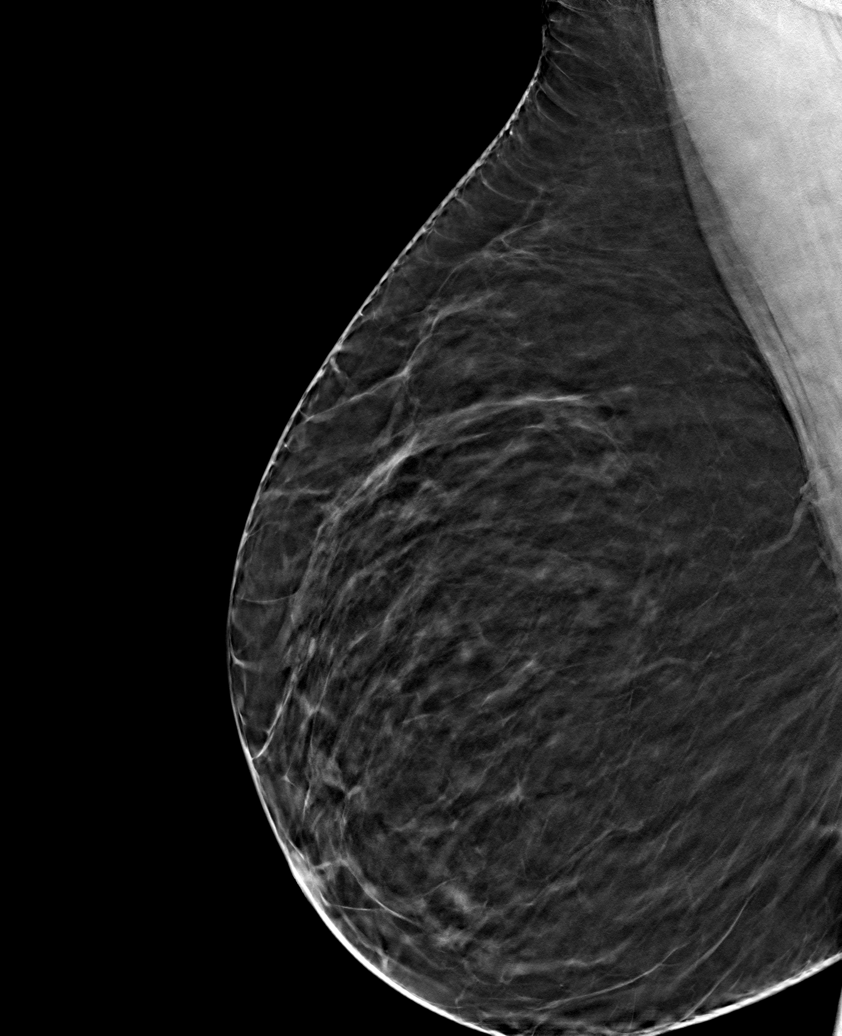

[6 of 30 positions shown; findings below may reference images not displayed]

ACR Breast Density Category b: There are scattered areas of
fibroglandular density.
FINDINGS: There are no findings suspicious for malignancy. Images were
processed with CAD.
IMPRESSION: No mammographic evidence of malignancy. A result letter of this
screening mammogram will be mailed directly to the patient.

RECOMMENDATION:
Screening mammogram in one year. (Code:[id number])

BI-RADS CATEGORY  1: Negative.

## 2022-08-28 ENCOUNTER — Ambulatory Visit (INDEPENDENT_AMBULATORY_CARE_PROVIDER_SITE_OTHER): Payer: BC Managed Care – PPO | Admitting: Licensed Clinical Social Worker

## 2022-08-28 DIAGNOSIS — F3181 Bipolar II disorder: Secondary | ICD-10-CM

## 2022-08-28 NOTE — Addendum Note (Signed)
Addended by: Rozanna Box on: 08/28/2022 04:53 PM   Modules accepted: Level of Service

## 2022-08-28 NOTE — Progress Notes (Addendum)
Virtual Visit via Video Note  I connected with Kathryn Munoz on 08/28/22 at  3:00 PM EDT by a video enabled telemedicine application and verified that I am speaking with the correct person using two identifiers.  Location: Patient: home Provider: [facility]   I discussed the limitations of evaluation and management by telemedicine and the availability of in person appointments. The patient expressed understanding and agreed to proceed.   I discussed the assessment and treatment plan with the patient. The patient was provided an opportunity to ask questions and all were answered. The patient agreed with the plan and demonstrated an understanding of the instructions.   The patient was advised to call back or seek an in-person evaluation if the symptoms worsen or if the condition fails to improve as anticipated.  I provided 45 minutes of non-face-to-face time during this encounter.  Trula Frede R Naziah Portee, LCSW  THERAPIST PROGRESS NOTE  Session Time: 1-145p  Participation Level: Active  Behavioral Response: Neat and Well GroomedAlertAnxious and Depressed  Type of Therapy: Individual Therapy  Treatment Goals addressed:Stabilize mood and increase goal-directed behavior  as evidenced by pt self report 3 out of 5 sessions documented.   ProgressTowards Goals: Progressing  Interventions: CBT and DBT  Summary: Kathryn Munoz is a 47 y.o. female who presents with improving symptoms related to bipolar disorder.  Patient reports that she has been compliant with her medications and compliant with her psychiatric appointments.  Patient reports good quality and quantity of sleep.  Explored patients overall focus on wellness. Discussed emotional wellness and included discussions about improving overall work/life balance and improvements in self care. Discussed physical wellness and included discussions about improving physical activity (at pts ability level) and  reviewed current nutritional choices and reviewed healthy nutritional choices. Allowed pt to explore what changes and improvements that they feel would be good personal goals without feeling too overwhelmed. Pt committed to change. Pt moving forward with bariatric surgery--has endoscopy scheduled in August. Pt has upcoming psychological evaluation w/ bariatric clinic. Pt states "because I want to live" when asked why she wants to lose the weight. Pts mother was on dialysis and pts brother is amputee because of diabetes.   Allowed pt to explore and identify scenarios or individuals that have triggered workplace stress. Assisted pt with identifying specific stress triggers related to work.  Allowed pt to explore what changes would need to happen to improve overall atmosphere while working. Short staffed.  Pt has big projects and a lot of responsibilities--falls back on her if projects don't get finished by deadline. Reviewed importance of setting boundaries and limits.   Continued recommendations are as follows: self care behaviors, positive social engagements, focusing on overall work/home/life balance, and focusing on positive physical and emotional wellness.   Suicidal/Homicidal: No  Therapist Response: Pt is continuing to apply interventions learned in session into daily life situations. Pt is currently on track to meet goals utilizing interventions mentioned above. Personal growth and progress noted. Treatment to continue as indicated.   Patient is continuing to identify specific barriers to success, identify triggers for specific thoughts and feelings, and is continuing to manage overall grief, mood, and stress.  Plan: Return again in 4 weeks.  Diagnosis:  Encounter Diagnosis  Name Primary?   Bipolar II disorder, mild, depressed, with anxious distress (HCC) Yes     Collaboration of Care: Other Pt encouraged to continue care with psychiatrist of record, Dr. Elna Breslow  Patient/Guardian was  advised Release of Information  must be obtained prior to any record release in order to collaborate their care with an outside provider. Patient/Guardian was advised if they have not already done so to contact the registration department to sign all necessary forms in order for [place] to release information regarding their care.   Consent: Patient/Guardian gives verbal consent for treatment and assignment of benefits for services provided during this visit. Patient/Guardian expressed understanding and agreed to proceed.   Active     Bipolar Disorder CCP Problem  1 Alleviate depressive/manic symptoms and return to improved levels of effective functioning.     LTG: Stabilize mood and increase goal-directed behavior: Input needed on appropriate metric (Progressing)     Start:  12/06/20    Expected End:  01/18/23       Goal Note     Pt reports mood stable since last session--no mood swings         STG: [name] will attend at least 80% of scheduled follow-up medication management appointments (Progressing)     Start:  12/06/20    Expected End:  01/18/23         Work with [name] to identify the major components of a recent episode of depression: physical symptoms, major thoughts and images, and major behaviors they experienced     PRN   Start:  12/06/20          Intervention Note     Allowed patient to explore and identify           Depression       Decrease depressive symptoms and improve levels of effective functioning-pt reports a decrease in overall depression symptoms 3 out of 5 sessions documented.  (Progressing)     Start:  11/27/21    Expected End:  01/18/23         Develop healthy thinking patterns and beliefs about self, others, and the world that lead to the alleviation and help prevent the relapse of depression per self report 3 out of 5 sessions documented.   (Progressing)     Start:  11/27/21    Expected End:  01/18/23         Encourage self-care  activities     Start:  11/27/21          Intervention Note     Reviewed         Encourage verbalization of feelings/concerns/expectations     Start:  11/27/21          Intervention Note     Allowed patient to explore and express thoughts and feelings         Assist with coping skills and behavior     Start:  11/27/21          Intervention Note     Allow patient to identify current coping skills            Ernest Haber Dejane Scheibe, LCSW 08/28/2022

## 2022-09-02 ENCOUNTER — Other Ambulatory Visit: Payer: Self-pay | Admitting: Psychiatry

## 2022-09-02 ENCOUNTER — Other Ambulatory Visit: Payer: Self-pay | Admitting: Internal Medicine

## 2022-09-02 DIAGNOSIS — E039 Hypothyroidism, unspecified: Secondary | ICD-10-CM

## 2022-09-02 DIAGNOSIS — F429 Obsessive-compulsive disorder, unspecified: Secondary | ICD-10-CM

## 2022-09-03 ENCOUNTER — Other Ambulatory Visit: Payer: Self-pay | Admitting: Psychiatry

## 2022-09-03 DIAGNOSIS — F3181 Bipolar II disorder: Secondary | ICD-10-CM

## 2022-09-03 DIAGNOSIS — F5105 Insomnia due to other mental disorder: Secondary | ICD-10-CM

## 2022-09-04 NOTE — Telephone Encounter (Signed)
Requested Prescriptions  Refused Prescriptions Disp Refills   levothyroxine (SYNTHROID) 100 MCG tablet [Pharmacy Med Name: LEVOTHYROXINE 100 MCG TABLET] 90 tablet 1    Sig: TAKE 1 TABLET (100 MCG TOTAL) BY MOUTH DAILY. FOLLOW UP IN OFFICE IN 6-8 WEEKS FOR LABS/RECHECK FOR MORE REFILLS     Endocrinology:  Hypothyroid Agents Passed - 09/02/2022  9:01 AM      Passed - TSH in normal range and within 360 days    TSH  Date Value Ref Range Status  04/13/2022 1.84 mIU/L Final    Comment:              Reference Range .           > or = 20 Years  0.40-4.50 .                Pregnancy Ranges           First trimester    0.26-2.66           Second trimester   0.55-2.73           Third trimester    0.43-2.91          Passed - Valid encounter within last 12 months    Recent Outpatient Visits           4 months ago Acquired hypothyroidism   [facility] Margarita Mail, DO   6 months ago Benign paroxysmal positional vertigo due to bilateral vestibular disorder   [facility] Margarita Mail, DO   9 months ago Annual physical exam   [facility] Danelle Berry, PA-C   1 year ago Encounter for annual physical exam   [facility] Berniece Salines, FNP   2 years ago Metabolic syndrome   [facility] Danelle Berry, PA-C       Future Appointments             In 2 months Danelle Berry, PA-C [facility], [facility]

## 2022-09-20 ENCOUNTER — Ambulatory Visit: Payer: BC Managed Care – PPO | Admitting: Psychiatry

## 2022-10-05 ENCOUNTER — Ambulatory Visit (INDEPENDENT_AMBULATORY_CARE_PROVIDER_SITE_OTHER): Payer: BC Managed Care – PPO | Admitting: Licensed Clinical Social Worker

## 2022-10-05 DIAGNOSIS — F3181 Bipolar II disorder: Secondary | ICD-10-CM

## 2022-10-05 NOTE — Progress Notes (Signed)
Virtual Visit via Video Note  I connected with Kathryn Munoz on 10/05/22 at  3:00 PM EDT by a video enabled telemedicine application and verified that I am speaking with the correct person using two identifiers.  Location: Patient: home Provider: [facility]   I discussed the limitations of evaluation and management by telemedicine and the availability of in person appointments. The patient expressed understanding and agreed to proceed.   I discussed the assessment and treatment plan with the patient. The patient was provided an opportunity to ask questions and all were answered. The patient agreed with the plan and demonstrated an understanding of the instructions.   The patient was advised to call back or seek an in-person evaluation if the symptoms worsen or if the condition fails to improve as anticipated.  I provided 45 minutes of non-face-to-face time during this encounter.  Kamika Goodloe R Madden Piazza, LCSW  THERAPIST PROGRESS NOTE  Session Time: 3-345p  Participation Level: Active  Behavioral Response: Neat and Well GroomedAlertAnxious and Depressed  Type of Therapy: Individual Therapy  Treatment Goals addressed:Stabilize mood and increase goal-directed behavior  as evidenced by pt self report 3 out of 5 sessions documented.   ProgressTowards Goals: Progressing  Interventions: CBT and DBT  Summary: Kathryn Munoz is a 47 y.o. female who presents with improving symptoms related to bipolar disorder.  Patient reports that she has been compliant with her medications and compliant with her psychiatric appointments.  Patient reports good quality and quantity of sleep.  Clinician assisted pt with identifying situations/scenarios/schemas triggering anxiety and/or depression symptoms. Allowed pt to explore and express thoughts and feelings and discussed current coping mechanisms. Reviewed changes/recommendations.  Pt states she feels good about  completing a project that she has been working on for a long time at work--it has pushed her outside of her comfort zone and she feels it has led to personal growth and recognizing that she has strengths that she wasn't aware of before.  Explored relationship with spouse--pt feels she is used to the absence of communication during the day while her husband is working--pt states it is not triggering as much insecurity as it has in the past. Pt states recently she has been intentional about making time to do activities just her and her husband. Pt feels that he may be comparing their relationship to his work colleagues' relationship. Provided empathy and support as pt explored how recent conversations with her husband have made her feel helpless because she is actively trying to improve time spent together. Pt feels its more physical intimacy that he wants and not other relationship bonding activities.   Explored binge eating behaviors " I am eating my feelings away right now" --pt reports that she knows what she should be doing as far as portion control, etc but is not doing them. Reviewed behavior modifications that pt is comfortable trying.  Pt states that she has upcoming psychological evaluation for bariatric surgery.  Continued recommendations are as follows: self care behaviors, positive social engagements, focusing on overall work/home/life balance, and focusing on positive physical and emotional wellness.   Suicidal/Homicidal: No  Therapist Response: Pt is continuing to apply interventions learned in session into daily life situations. Pt is currently on track to meet goals utilizing interventions mentioned above. Personal growth and progress noted. Treatment to continue as indicated.   Patient is continuing to identify specific barriers to success, identify triggers for specific thoughts and feelings, and is continuing to manage overall grief, mood, and  stress.  Plan: Informed patient that  clinician will be leaving outpatient department. Allowed pt to explore any questions or concerns and discussed future counseling options/resources. Provided pt with psychoeducational resources and list of OPT therapists. Encouraged pt to continue with psychiatric med management appointments, if applicable.   Diagnosis:  Encounter Diagnosis  Name Primary?   Bipolar II disorder, mild, depressed, with anxious distress (HCC) Yes    Collaboration of Care: Other Pt encouraged to continue care with psychiatrist of record, Dr. Elna Breslow  Patient/Guardian was advised Release of Information must be obtained prior to any record release in order to collaborate their care with an outside provider. Patient/Guardian was advised if they have not already done so to contact the registration department to sign all necessary forms in order for [place] to release information regarding their care.   Consent: Patient/Guardian gives verbal consent for treatment and assignment of benefits for services provided during this visit. Patient/Guardian expressed understanding and agreed to proceed.     Ernest Haber Demerius Podolak, LCSW 10/05/2022

## 2022-10-06 NOTE — Patient Instructions (Signed)
 Outpatient Psychiatry and Counseling  FOR CRISIS:  call 911, [facility]: Mobile Crisis Management 24 hours:  [phone], call [phone], [facility] ([facility]) [address] walk in, or go to your local EMERGENCY DEPARTMENT  [facility] [address], [place], [place] [address]  [phone]  [facility] [address] [place], [place] [address] [phone]  [facility] [address] [place],  [place]  [address] [phone]  [facility] Address: [address] Maurine Cane [place], [place] [address] Phone: [phone]  [facility] [place] and [place] Locations) [email or web address]  [employer] [facility] [employer] fee and walk in schedule: M-F 8am-12pm/1pm-3pm [address]  [place], [place] [address] [phone]  [facility] [address] [place], [place] [address] [phone]  [facility] Outpatient Services/ Intensive Outpatient Therapy Program/CDIOP/PHP [address] [place], [place] [address] [phone]  [facility], Outpatient Therapy Services, [place]      [phone]     [address]    [place], [place] [address]                 [facility]   [facility] [phone]. [address] [place], [place] [address]  [facility]          [address] [place]  [place], [place] [address]       [phone]  [facility] [address] [place], [place] [address] [phone]  [facility]    [address]    [place], [place] [address]     [phone]       [facility] [address] [place] [address]  [facility]     [address] [place], [place]      [phone]       [facility] Kathryn Ditch, MD [address] [place], [place] [address] [phone]  [facility]     [address]     [place], [place] [address]     [phone]       [facility] [address] [place], [place] [address] [phone] Resources for Temporary Residential Assistance/Crisis Centers

## 2022-11-07 ENCOUNTER — Other Ambulatory Visit: Payer: Self-pay | Admitting: Internal Medicine

## 2022-11-07 DIAGNOSIS — E039 Hypothyroidism, unspecified: Secondary | ICD-10-CM

## 2022-11-08 ENCOUNTER — Ambulatory Visit: Payer: BC Managed Care – PPO | Admitting: Psychiatry

## 2022-11-08 ENCOUNTER — Encounter: Payer: Self-pay | Admitting: Psychiatry

## 2022-11-08 VITALS — BP 137/86 | HR 74 | Temp 97.8°F | Ht 64.0 in | Wt 241.2 lb

## 2022-11-08 DIAGNOSIS — F3181 Bipolar II disorder: Secondary | ICD-10-CM

## 2022-11-08 DIAGNOSIS — Z79899 Other long term (current) drug therapy: Secondary | ICD-10-CM | POA: Diagnosis not present

## 2022-11-08 DIAGNOSIS — F429 Obsessive-compulsive disorder, unspecified: Secondary | ICD-10-CM | POA: Diagnosis not present

## 2022-11-08 DIAGNOSIS — F5105 Insomnia due to other mental disorder: Secondary | ICD-10-CM | POA: Diagnosis not present

## 2022-11-08 MED ORDER — QUETIAPINE FUMARATE 25 MG PO TABS: 25.0000 mg | ORAL_TABLET | Freq: Every day | ORAL | 0 refills | Status: DC

## 2022-11-08 NOTE — Telephone Encounter (Signed)
Requested Prescriptions  Pending Prescriptions Disp Refills   levothyroxine (SYNTHROID) 100 MCG tablet [Pharmacy Med Name: LEVOTHYROXINE 100 MCG TABLET] 90 tablet 1    Sig: TAKE 1 TABLET (100 MCG TOTAL) BY MOUTH DAILY. FOLLOW UP IN OFFICE IN 6-8 WEEKS FOR LABS/RECHECK FOR MORE REFILLS     Endocrinology:  Hypothyroid Agents Passed - 11/07/2022 11:18 AM      Passed - TSH in normal range and within 360 days    TSH  Date Value Ref Range Status  04/13/2022 1.84 mIU/L Final    Comment:              Reference Range .           > or = 20 Years  0.40-4.50 .                Pregnancy Ranges           First trimester    0.26-2.66           Second trimester   0.55-2.73           Third trimester    0.43-2.91          Passed - Valid encounter within last 12 months    Recent Outpatient Visits           6 months ago Acquired hypothyroidism   [facility] Margarita Mail, DO   8 months ago Benign paroxysmal positional vertigo due to bilateral vestibular disorder   [facility] Margarita Mail, DO   11 months ago Annual physical exam   [facility] Danelle Berry, PA-C   1 year ago Encounter for annual physical exam   [facility] Berniece Salines, FNP   2 years ago Metabolic syndrome   [facility] Danelle Berry, PA-C       Future Appointments             In 2 weeks Danelle Berry, PA-C [facility]

## 2022-11-08 NOTE — Progress Notes (Unsigned)
BH MD OP Progress Note  11/08/2022 3:04 PM Kathryn Munoz  MRN:  [id number]  Chief Complaint:  Chief Complaint  Patient presents with   Follow-up   Depression   Anxiety   Medication Refill   HPI: Kathryn Munoz is a 47 year old African-American female, lives in [place], has a history of bipolar disorder, insomnia, insulin resistance, hypothyroidism, other specified eating disorder was evaluated in office today.  Patient today reports she is currently struggling with anxiety symptoms, racing thoughts, mood swings, irritability, getting worse since the past several weeks.  Patient reports she has a lot of racing thoughts and since the past couple of weeks sleep is restless.  The trazodone used to help her with her sleep prior to that.  Patient reports she is prescribed hydroxyzine 75 mg and that does make her groggy when she wakes up in the morning.  She hence has not been using it.  Patient denies any significant obsessions or compulsive symptoms at this time.  Patient reports she is currently trying to lose weight.  She is planning to get bariatric surgery and is currently working with bariatric team.  Patient does report situational stressors of work-related problems.  She reports she is at a new job since October 2023 and it has been stressful.  She also reports her brother had a stroke recently and is currently admitted to the hospital.  All this does make her anxiety worse.  She currently does not have a therapist since her therapist left the practice.  She is motivated to establish care with a new therapist.  Patient denies any suicidality, homicidality or perceptual disturbances.  Patient denies any other concerns today.  Visit Diagnosis:    ICD-10-CM   1. Bipolar II disorder, mild, depressed, with anxious distress (HCC)  F31.81 QUEtiapine (SEROQUEL) 25 MG tablet    Prolactin    Lipid panel   Type 2 with anxious distress    2. Insomnia due to mental condition  F51.05  QUEtiapine (SEROQUEL) 25 MG tablet   mood    3. 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  F42.9     4. High risk medication use  Z79.899 Prolactin    Lipid panel      Past Psychiatric History: I have reviewed past psychiatric history from progress note on 11/08/2017.  Past trials of Celexa.  Past history of suicide attempts at the age of 79 by overdose on pills.  Another suicide attempt at the age of 20-slept through it, did not tell anyone at that time.  Past Medical History:  Past Medical History:  Diagnosis Date   Abnormal mammogram    breast biopsy, [facility] 2014   ADD (attention deficit disorder)    Anxiety    Bipolar 2 disorder (HCC)    Constipation    Depression    Dermatitis    Heavy menstrual bleeding    History of gestational diabetes    History of kidney stones 2014   Hypothyroidism    IFG (impaired fasting glucose)    Insulin resistance    Irregular periods    Joint pain    Multiple food allergies    Dates and nuts   Neuropathy    Obesity    OCD (obsessive compulsive disorder)    Prediabetes    Vitamin D deficiency disease     Past Surgical History:  Procedure Laterality Date   BREAST BIOPSY Left 2014   [place] bx/clip-neg   INTRAUTERINE DEVICE INSERTION  03/11/2012  TRIGGER FINGER RELEASE Right 04/21/2021    Family Psychiatric History: I have reviewed family psychiatric history from progress note on 11/08/2017.  Family History:  Family History  Problem Relation Age of Onset   Diabetes Mother    Pancreatitis Mother    Hypertension Mother    Alcohol abuse Mother    Schizophrenia Mother    Depression Mother    Heart disease Mother    Stroke Mother    Thyroid disease Mother    Kidney disease Mother    Anxiety disorder Mother    Alcohol abuse Father    Diabetes Sister        pre-diabetic   Diabetes Brother    Drug abuse Brother    ADD / ADHD Son    Eczema Son    Breast cancer Cousin        pat cousin   Anxiety disorder Other     Ovarian cancer Neg Hx     Social History: I have reviewed social history from progress note on 11/08/2017. Social History   Socioeconomic History   Marital status: Married    Spouse name: Hermoine Frevert   Number of children: 2   Years of education: Not on file   Highest education level: Bachelor's degree (e.g., BA, AB, BS)  Occupational History   Occupation: Charity fundraiser  Tobacco Use   Smoking status: Never   Smokeless tobacco: Never  Vaping Use   Vaping status: Never Used  Substance and Sexual Activity   Alcohol use: Yes    Comment: Ocassionally   Drug use: No   Sexual activity: Yes    Partners: Male    Birth control/protection: Inserts    Comment: nuvaring  Other Topics Concern   Not on file  Social History Narrative   Not on file   Social Determinants of Health   Financial Resource Strain: Low Risk  (11/22/2021)   Overall Financial Resource Strain (CARDIA)    Difficulty of Paying Living Expenses: Not hard at all  Food Insecurity: No Food Insecurity (11/22/2021)   Hunger Vital Sign    Worried About Running Out of Food in the Last Year: Never true    Ran Out of Food in the Last Year: Never true  Transportation Needs: No Transportation Needs (11/22/2021)   PRAPARE - Administrator, Civil Service (Medical): No    Lack of Transportation (Non-Medical): No  Physical Activity: Inactive (11/22/2021)   Exercise Vital Sign    Days of Exercise per Week: 0 days    Minutes of Exercise per Session: 0 min  Stress: Stress Concern Present (11/22/2021)   [employer] of Occupational Health - Occupational Stress Questionnaire    Feeling of Stress : To some extent  Social Connections: Socially Integrated (11/22/2021)   Social Connection and Isolation Panel [NHANES]    Frequency of Communication with Friends and Family: More than three times a week    Frequency of Social Gatherings with Friends and Family: More than three times a week    Attends Religious Services: 1 to 4 times per  year    Active Member of [employer] or Organizations: Yes    Attends Banker Meetings: 1 to 4 times per year    Marital Status: Married    Allergies:  Allergies  Allergen Reactions   Date Seed Extract  [Zizyphus Jujuba] Anaphylaxis, Cough, Itching and Swelling   Other Swelling    Metabolic Disorder Labs: Lab Results  Component Value Date   HGBA1C 6.0 (  H) 04/13/2022   MPG 126 04/13/2022   MPG 117 03/31/2019   No results found for: "PROLACTIN" Lab Results  Component Value Date   CHOL 170 06/01/2021   TRIG 163 (H) 06/01/2021   HDL 53 06/01/2021   CHOLHDL 3.2 06/01/2021   VLDL 14 10/16/2016   LDLCALC 89 06/01/2021   LDLCALC 93 05/04/2020   Lab Results  Component Value Date   TSH 1.84 04/13/2022   TSH 3.29 02/16/2022    Therapeutic Level Labs: No results found for: "LITHIUM" No results found for: "VALPROATE" No results found for: "CBMZ"  Current Medications: Current Outpatient Medications  Medication Sig Dispense Refill   citalopram (CELEXA) 20 MG tablet TAKE 1 TABLET BY MOUTH EVERY DAY 90 tablet 1   EPINEPHrine 0.3 mg/0.3 mL IJ SOAJ injection INJECT 0.3 MLS INTO THE MUSCLE ONCE FOR 1 DOSE. FOR LIFE-THREATENING ALLERGIC REACTION / ANAPHYLAXIS  1   hydrOXYzine (VISTARIL) 25 MG capsule TAKE 1-3 CAPSULES (25-75 MG TOTAL) BY MOUTH AT BEDTIME AS NEEDED. SLEEP AND ANXIETY 270 capsule 1   lamoTRIgine (LAMICTAL) 150 MG tablet TAKE 1 TABLET (150 MG TOTAL) BY MOUTH DAILY. TAKE ALONG WITH 25 MG DAILY 90 tablet 1   lamoTRIgine (LAMICTAL) 25 MG tablet Take 1 tablet (25 mg total) by mouth daily. Take along with 150 mg tab 90 tablet 0   levonorgestrel (MIRENA, 52 MG,) 20 MCG/DAY IUD Frequency:UNKNOWN   Dosage:0.0     Instructions:  Note:Dose: UNKNOWN     levothyroxine (SYNTHROID) 100 MCG tablet TAKE 1 TABLET (100 MCG TOTAL) BY MOUTH DAILY. FOLLOW UP IN OFFICE IN 6-8 WEEKS FOR LABS/RECHECK FOR MORE REFILLS 90 tablet 1   linaclotide (LINZESS) 145 MCG CAPS capsule TAKE 1 CAPSULE  BY MOUTH EVERY DAY BEFORE BREAKFAST 90 capsule 3   metFORMIN (GLUCOPHAGE-XR) 500 MG 24 hr tablet Take 3 tablets (1,500 mg total) by mouth daily. 270 tablet 3   nystatin (MYCOSTATIN/NYSTOP) powder Apply 1 Application topically 3 (three) times daily. 45 g 0   ondansetron (ZOFRAN) 4 MG tablet Take 1 tablet (4 mg total) by mouth every 8 (eight) hours as needed for nausea or vomiting. 20 tablet 1   QUEtiapine (SEROQUEL) 25 MG tablet Take 1 tablet (25 mg total) by mouth at bedtime. 90 tablet 0   traZODone (DESYREL) 50 MG tablet TAKE 1 TO 2 TABLETS BY MOUTH AT BEDTIME AS NEEDED FOR SLEEP 180 tablet 0   Vitamin D, Ergocalciferol, (DRISDOL) 1.25 MG (50000 UNIT) CAPS capsule Take 1 capsule (50,000 Units total) by mouth every 7 (seven) days. 4 capsule 0   No current facility-administered medications for this visit.     Musculoskeletal: Strength & Muscle Tone: within normal limits Gait & Station: normal Patient leans: N/A  Psychiatric Specialty Exam: Review of Systems  Psychiatric/Behavioral:  Positive for sleep disturbance. The patient is nervous/anxious.        Mood swings    Blood pressure 137/86, pulse 74, temperature 97.8 F (36.6 C), temperature source Skin, height 5\' 4"  (1.626 m), weight 241 lb 3.2 oz (109.4 kg).Body mass index is 41.4 kg/m.  General Appearance: Casual  Eye Contact:  Fair  Speech:  Clear and Coherent  Volume:  Normal  Mood:  Anxious and Mood swings  Affect:  Congruent  Thought Process:  Goal Directed and Descriptions of Associations: Intact  Orientation:  Full (Time, Place, and Person)  Thought Content: Logical   Suicidal Thoughts:  No  Homicidal Thoughts:  No  Memory:  Immediate;   Fair Recent;  Fair Remote;   Fair  Judgement:  Fair  Insight:  Fair  Psychomotor Activity:  Normal  Concentration:  Concentration: Fair and Attention Span: Fair  Recall:  [name] of Knowledge: Fair  Language: Fair  Akathisia:  No  Handed:  Right  AIMS (if indicated): not  done  Assets:  Communication Skills Desire for Improvement Housing Social Support  ADL's:  Intact  Cognition: WNL  Sleep:  Poor   Screenings: AIMS    Flowsheet Row Office Visit [facility]08[facility]20[facility] in [facility] Video Visit from 10/24/2021 in [facility] Video Visit from 07/27/2021 in [facility] Video Visit from 10/28/2020 in [facility]  AIMS Total Score 0 0 0 0      GAD-7    Flowsheet Row Office Visit from 04/13/2022 in [facility] Video Visit from 02/23/2022 in [facility] Office Visit from 11/22/2021 in [facility] Video Visit from 11/17/2021 in [facility] Video Visit from 07/27/2021 in [facility]  Total GAD-7 Score 0 11 0 3 2      PHQ2-9    Flowsheet Row Counselor from 10/05/2022 in [facility] Video Visit from 07/27/2022 in [facility] Video Visit from 04/17/2022 in [facility] Office Visit from 04/13/2022 in [facility] Video Visit from 02/23/2022 in [facility]  PHQ-2 Total Score 1 1 0 0 2  PHQ-9 Total Score -- -- -- 0 5      Flowsheet Row Counselor from 10/05/2022 in [facility] Video Visit from 07/27/2022 in [facility] Video Visit from 04/17/2022 in [facility]  C-SSRS RISK CATEGORY Low Risk Low Risk Low Risk        Assessment and Plan: ARIJ LYDY is a 47 year old African-American female, married, employed, lives in [place], has a  history of bipolar disorder, sleep problems, OCD, hypothyroidism was evaluated in office today.  Patient with multiple psychosocial stressors, worsening mood symptoms, sleep problems, will benefit from the following medication management, referral for CBT-plan as noted below.  Plan Bipolar disorder type II depressed with anxious distress-unstable Lamotrigine 175 mg p.o. daily Celexa 20 mg p.o. daily-dose reduced due to sexual dysfunction Restart Seroquel 25 mg p.o. nightly. Patient advised to hold the trazodone since Seroquel can also help with sleep.  She could also take a low dosage of trazodone as needed half to 1 tablet if needed.  OCD-stable Continue CBT, patient provided resources in the community including information for Ms. Felecia Jan. Celexa 20 mg p.o. daily  Insomnia-unstable Restart Seroquel 25 mg p.o. nightly Patient also has trazodone 50-100 mg at bedtime as needed, patient to use it as needed only. Hydroxyzine 25 to 75 mg at bedtime as needed Continue sleep hygiene techniques Pending report of sleep study-will need to request this.   High risk medication use-will order a lipid panel, prolactin level, patient provided lab slip.   Collaboration of Care: Collaboration of Care: Referral or follow-up with counselor/therapist AEB patient encouraged to establish care with a therapist.  Patient/Guardian was advised Release of Information must be obtained prior to any record release in order to collaborate their care with an outside  provider. Patient/Guardian was advised if they have not already done so to contact the registration department to sign all necessary forms in order for [place] to release information regarding their care.   Consent: Patient/Guardian gives verbal consent for treatment and assignment of benefits for services provided during this visit. Patient/Guardian expressed understanding and agreed to proceed.   Follow-up in clinic in 4 weeks or sooner if  needed.  This note was generated in part or whole with voice recognition software. Voice recognition is usually quite accurate but there are transcription errors that can and very often do occur. I apologize for any typographical errors that were not detected and corrected.    Jomarie Longs, MD 11/08/2022, 3:04 PM

## 2022-11-08 NOTE — Patient Instructions (Signed)
Quetiapine Tablets What is this medication? QUETIAPINE (kwe TYE a peen) treats schizophrenia and bipolar disorder. It works by balancing the levels of dopamine and serotonin in your brain, hormones that help regulate mood, behaviors, and thoughts. It belongs to a group of medications called antipsychotics. Antipsychotic medications can be used to treat several kinds of mental health conditions. This medicine may be used for other purposes; ask your health care provider or pharmacist if you have questions. COMMON BRAND NAME(S): Seroquel What should I tell my care team before I take this medication? They need to know if you have any of these conditions: Blockage in your bowels Cataracts Constipation Dementia Diabetes Difficulty swallowing Glaucoma Heart disease High levels of prolactin History of breast cancer History of irregular heartbeat Liver disease Low blood cell levels (white cells, red cells, and platelets) Low blood pressure Parkinson disease Prostate disease Seizures Suicidal thoughts, plans, or attempt by you or a family member Thyroid disease Trouble passing urine An unusual or allergic reaction to quetiapine, other medications, foods, dyes, or preservatives Pregnant or trying to get pregnant Breastfeeding How should I use this medication? Take this medication by mouth with water. Take it as directed on the prescription label at the same time every day. You can take it with or without food. If it upsets your stomach, take it with food. Keep taking it unless your care team tells you to stop. A special MedGuide will be given to you by the pharmacist with each prescription and refill. Be sure to read this information carefully each time. Talk to your care team about the use of this medication in children. While this medication may be prescribed for children as young as 10 years for selected conditions, precautions do apply. People over 1 years of age may have a stronger  reaction to this medication and need smaller doses. Overdosage: If you think you have taken too much of this medicine contact a poison control center or emergency room at once. NOTE: This medicine is only for you. Do not share this medicine with others. What if I miss a dose? If you miss a dose, take it as soon as you can. If it is almost time for your next dose, take only that dose. Do not take double or extra doses. What may interact with this medication? Do not take this medication with any of the following: Cisapride Dronedarone Metoclopramide Pimozide Thioridazine This medication may also interact with the following: Alcohol Antihistamines for allergy, cough, and cold Atropine Avasimibe Certain antivirals for HIV or hepatitis Certain medications for anxiety or sleep Certain medications for bladder problems, such as oxybutynin, tolterodine Certain medications for depression, such as amitriptyline, fluoxetine, nefazodone, sertraline Certain medications for fungal infections, such as fluconazole, ketoconazole, itraconazole, posaconazole Certain medications for stomach problems, such as dicyclomine, hyoscyamine Certain medications for travel sickness, such as scopolamine Cimetidine General anesthetics, such as halothane, isoflurane, methoxyflurane, propofol Ipratropium Levodopa or other medications for Parkinson disease Medications for blood pressure Medications for seizures Medications that relax muscles for surgery Opioid medications for pain Other medications that cause heart rhythm changes Phenothiazines, such as chlorpromazine, prochlorperazine Rifampin St. John's wort This list may not describe all possible interactions. Give your health care provider a list of all the medicines, herbs, non-prescription drugs, or dietary supplements you use. Also tell them if you smoke, drink alcohol, or use illegal drugs. Some items may interact with your medicine. What should I watch for  while using this medication? Visit your care team for regular  checks on your progress. Tell your care team if your symptoms do not start to get better or if they get worse. Do not suddenly stop taking This medication. You may develop a severe reaction. Your care team will tell you how much medication to take. If your care team wants you to stop the medication, the dose may be slowly lowered over time to avoid any side effects. You may need to have an eye exam before and during use of this medication. This medication may increase blood sugar. Ask your care team if changes in diet or medications are needed if you have diabetes. This medication may cause thoughts of suicide or depression. This includes sudden changes in mood, behaviors, or thoughts. These changes can happen at any time but are more common in the beginning of treatment or after a change in dose. Call your care team right away if you experience these thoughts or worsening depression. This medication may affect your coordination, reaction time, or judgment. Do not drive or operate machinery until you know how this medication affects you. Sit up or stand slowly to reduce the risk of dizzy or fainting spells. Drinking alcohol with this medication can increase the risk of these side effects. This medication can cause problems with controlling your body temperature. It can lower the response of your body to cold temperatures. If possible, stay indoors during cold weather. If you must go outdoors, wear warm clothes. It can also lower the response of your body to heat. Do not overheat. Do not over-exercise. Stay out of the sun when possible. If you must be in the sun, wear cool clothing. Drink plenty of water. If you have trouble controlling your body temperature, call your care team right away. What side effects may I notice from receiving this medication? Side effects that you should report to your care team as soon as possible: Allergic  reactions--skin rash, itching, hives, swelling of the face, lips, tongue, or throat Heart rhythm changes--fast or irregular heartbeat, dizziness, feeling faint or lightheaded, chest pain, trouble breathing High blood sugar (hyperglycemia)--increased thirst or amount of urine, unusual weakness or fatigue, blurry vision High fever, stiff muscles, increased sweating, fast or irregular heartbeat, and confusion, which may be signs of neuroleptic malignant syndrome High prolactin level--unexpected breast tissue growth, discharge from the nipple, change in sex drive or performance, irregular menstrual cycle Increase in blood pressure in children Infection--fever, chills, cough, or sore throat Low blood pressure--dizziness, feeling faint or lightheaded, blurry vision Low thyroid levels (hypothyroidism)--unusual weakness or fatigue, increased sensitivity to cold, constipation, hair loss, dry skin, weight gain, feelings of depression Pain or trouble swallowing Seizures Stroke--sudden numbness or weakness of the face, arm, or leg, trouble speaking, confusion, trouble walking, loss of balance or coordination, dizziness, severe headache, change in vision Sudden eye pain or change in vision such as blurry vision, seeing halos around lights, vision loss Thoughts of suicide or self-harm, worsening mood, feelings of depression Trouble passing urine Uncontrolled and repetitive body movements, muscle stiffness or spasms, tremors or shaking, loss of balance or coordination, restlessness, shuffling walk, which may be signs of extrapyramidal symptoms (EPS) Side effects that usually do not require medical attention (report to your care team if they continue or are bothersome): Constipation Dizziness Drowsiness Dry mouth Weight gain This list may not describe all possible side effects. Call your doctor for medical advice about side effects. You may report side effects to FDA at 1-800-FDA-1088. Where should I keep my  medication? Keep out  of the reach of children. Store at room temperature between 15 and 30 degrees C (59 and 86 degrees F). Throw away any unused medication after the expiration date. NOTE: This sheet is a summary. It may not cover all possible information. If you have questions about this medicine, talk to your doctor, pharmacist, or health care provider.  2024 Elsevier/Gold Standard (2021-09-19 00:00:00)

## 2022-11-09 LAB — LIPID PANEL
Chol/HDL Ratio: 5.3 ratio — ABNORMAL HIGH (ref 0.0–4.4)
Cholesterol, Total: 250 mg/dL — ABNORMAL HIGH (ref 100–199)
HDL: 47 mg/dL (ref >39)
LDL Chol Calc (NIH): 181 mg/dL — ABNORMAL HIGH (ref 0–99)
Triglycerides: 124 mg/dL (ref 0–149)
VLDL Cholesterol Cal: 22 mg/dL (ref 5–40)

## 2022-11-09 LAB — PROLACTIN: Prolactin: 10.9 ng/mL (ref 4.8–33.4)

## 2022-11-17 ENCOUNTER — Other Ambulatory Visit: Payer: Self-pay | Admitting: Psychiatry

## 2022-11-17 DIAGNOSIS — F429 Obsessive-compulsive disorder, unspecified: Secondary | ICD-10-CM

## 2022-11-27 NOTE — Patient Instructions (Incomplete)
See this link for instructions and documents that will help you complete advance care planning/advanced directives - including designating a medical power of attorney, completing a living will, etc  [email or web address]  Health Maintenance  Topic Date Due   Flu Shot  10/19/2022   COVID-19 Vaccine (4 - 2023-24 season) 12/14/2022*   Mammogram  02/16/2023   Pap Smear  11/18/2023   DTaP/Tdap/Td vaccine (3 - Td or Tdap) 10/10/2025   Colon Cancer Screening  11/08/2032   Hepatitis C Screening  Completed   HIV Screening  Completed   HPV Vaccine  Aged Out  *Topic was postponed. The date shown is not the original due date.     Preventive Care 72-11 Years Old, Female Preventive care refers to lifestyle choices and visits with your health care provider that can promote health and wellness. Preventive care visits are also called wellness exams. What can I expect for my preventive care visit? Counseling Your health care provider may ask you questions about your: Medical history, including: Past medical problems. Family medical history. Pregnancy history. Current health, including: Menstrual cycle. Method of birth control. Emotional well-being. Home life and relationship well-being. Sexual activity and sexual health. Lifestyle, including: Alcohol, nicotine or tobacco, and drug use. Access to firearms. Diet, exercise, and sleep habits. Work and work Astronomer. Sunscreen use. Safety issues such as seatbelt and bike helmet use. Physical exam Your health care provider will check your: Height and weight. These may be used to calculate your BMI (body mass index). BMI is a measurement that tells if you are at a healthy weight. Waist circumference. This measures the distance around your waistline. This measurement also tells if you are at a healthy weight and may help predict your risk of certain diseases, such as type 2 diabetes and high blood  pressure. Heart rate and blood pressure. Body temperature. Skin for abnormal spots. What immunizations do I need?  Vaccines are usually given at various ages, according to a schedule. Your health care provider will recommend vaccines for you based on your age, medical history, and lifestyle or other factors, such as travel or where you work. What tests do I need? Screening Your health care provider may recommend screening tests for certain conditions. This may include: Lipid and cholesterol levels. Diabetes screening. This is done by checking your blood sugar (glucose) after you have not eaten for a while (fasting). Pelvic exam and Pap test. Hepatitis B test. Hepatitis C test. HIV (human immunodeficiency virus) test. STI (sexually transmitted infection) testing, if you are at risk. Lung cancer screening. Colorectal cancer screening. Mammogram. Talk with your health care provider about when you should start having regular mammograms. This may depend on whether you have a family history of breast cancer. BRCA-related cancer screening. This may be done if you have a family history of breast, ovarian, tubal, or peritoneal cancers. Bone density scan. This is done to screen for osteoporosis. Talk with your health care provider about your test results, treatment options, and if necessary, the need for more tests. Follow these instructions at home: Eating and drinking  Eat a diet that includes fresh fruits and vegetables, whole grains, lean protein, and low-fat dairy products. Take vitamin and mineral supplements as recommended by your health care provider. Do not drink alcohol if: Your health care provider tells you not to drink. You are pregnant, may be pregnant, or are planning to become pregnant. If you drink alcohol: Limit how much you have to 0-1 drink a day. Know how much  alcohol is in your drink. In the [place] one drink equals one 12 oz bottle of beer (355 mL), one 5 oz glass of wine  (148 mL), or one 1 oz glass of hard liquor (44 mL). Lifestyle Brush your teeth every morning and night with fluoride toothpaste. Floss one time each day. Exercise for at least 30 minutes 5 or more days each week. Do not use any products that contain nicotine or tobacco. These products include cigarettes, chewing tobacco, and vaping devices, such as e-cigarettes. If you need help quitting, ask your health care provider. Do not use drugs. If you are sexually active, practice safe sex. Use a condom or other form of protection to prevent STIs. If you do not wish to become pregnant, use a form of birth control. If you plan to become pregnant, see your health care provider for a prepregnancy visit. Take aspirin only as told by your health care provider. Make sure that you understand how much to take and what form to take. Work with your health care provider to find out whether it is safe and beneficial for you to take aspirin daily. Find healthy ways to manage stress, such as: Meditation, yoga, or listening to music. Journaling. Talking to a trusted person. Spending time with friends and family. Minimize exposure to UV radiation to reduce your risk of skin cancer. Safety Always wear your seat belt while driving or riding in a vehicle. Do not drive: If you have been drinking alcohol. Do not ride with someone who has been drinking. When you are tired or distracted. While texting. If you have been using any mind-altering substances or drugs. Wear a helmet and other protective equipment during sports activities. If you have firearms in your house, make sure you follow all gun safety procedures. Seek help if you have been physically or sexually abused. What's next? Visit your health care provider once a year for an annual wellness visit. Ask your health care provider how often you should have your eyes and teeth checked. Stay up to date on all vaccines. This information is not intended to replace  advice given to you by your health care provider. Make sure you discuss any questions you have with your health care provider. Document Revised: 09/01/2020 Document Reviewed: 09/01/2020 Elsevier Patient Education  2024 [employer].

## 2022-11-28 ENCOUNTER — Encounter: Payer: Self-pay | Admitting: Family Medicine

## 2022-11-28 ENCOUNTER — Ambulatory Visit (INDEPENDENT_AMBULATORY_CARE_PROVIDER_SITE_OTHER): Payer: BC Managed Care – PPO | Admitting: Family Medicine

## 2022-11-28 VITALS — BP 116/74 | HR 73 | Temp 97.1°F | Resp 16 | Ht 65.5 in | Wt 245.0 lb

## 2022-11-28 DIAGNOSIS — E7849 Other hyperlipidemia: Secondary | ICD-10-CM | POA: Diagnosis not present

## 2022-11-28 DIAGNOSIS — E038 Other specified hypothyroidism: Secondary | ICD-10-CM

## 2022-11-28 DIAGNOSIS — E559 Vitamin D deficiency, unspecified: Secondary | ICD-10-CM

## 2022-11-28 DIAGNOSIS — Z6841 Body Mass Index (BMI) 40.0 and over, adult: Secondary | ICD-10-CM

## 2022-11-28 DIAGNOSIS — E8881 Metabolic syndrome: Secondary | ICD-10-CM | POA: Diagnosis not present

## 2022-11-28 DIAGNOSIS — Z0000 Encounter for general adult medical examination without abnormal findings: Secondary | ICD-10-CM | POA: Diagnosis not present

## 2022-11-28 DIAGNOSIS — Z1231 Encounter for screening mammogram for malignant neoplasm of breast: Secondary | ICD-10-CM | POA: Diagnosis not present

## 2022-11-28 DIAGNOSIS — K5903 Drug induced constipation: Secondary | ICD-10-CM

## 2022-11-28 MED ORDER — LINACLOTIDE 145 MCG PO CAPS: ORAL_CAPSULE | ORAL | 3 refills | Status: AC

## 2022-11-28 NOTE — Progress Notes (Signed)
Patient: Kathryn Munoz, Female    DOB: [birth year], 47 y.o.   MRN: [id number] Kathryn Mail, DO Visit Date: 11/28/2022  Today's Provider: Danelle Berry, PA-C   Chief Complaint  Patient presents with   Annual Exam   Subjective:   Annual physical exam:  SECRET CRADLE is a 47 y.o. female who presents today for complete physical exam:  Exercise/Activity: see below Diet/nutrition:  working with RD/bariatric specialists at [facility]  SDOH Screenings   Food Insecurity: No Food Insecurity (11/28/2022)  Housing: Low Risk  (11/28/2022)  Transportation Needs: No Transportation Needs (11/28/2022)  Utilities: Not At Risk (11/28/2022)  Alcohol Screen: Low Risk  (11/28/2022)  Depression (PHQ2-9): Medium Risk (11/28/2022)  Financial Resource Strain: Low Risk  (11/28/2022)  Physical Activity: Inactive (11/28/2022)  Social Connections: Moderately Integrated (11/28/2022)  Stress: Stress Concern Present (11/28/2022)  Tobacco Use: Low Risk  (11/28/2022)  Health Literacy: Adequate Health Literacy (11/28/2022)   High cholesterol with recent psychiatry labs   USPSTF grade A and B recommendations - reviewed and addressed today  Depression:  Phq 9 completed today by patient, was reviewed by me with patient in the room PHQ score is positive, est with psychiatry on multiple meds recent med changes due to poorly controlled sx    11/28/2022    8:43 AM 11/08/2022    5:21 PM 10/06/2022    9:36 AM 07/27/2022    2:45 PM  PHQ 2/9 Scores  PHQ - 2 Score 2 2 1 1   PHQ- 9 Score 8 13        11/28/2022    8:43 AM 11/08/2022    5:21 PM 10/06/2022    9:36 AM 07/27/2022    2:45 PM 04/17/2022    1:38 PM  Depression screen PHQ 2/9  Decreased Interest 1 1 0 0 0  Down, Depressed, Hopeless 1 1 1 1  0  PHQ - 2 Score 2 2 1 1  0  Altered sleeping 3 2     Tired, decreased energy 3 2     Change in appetite 0 1     Feeling bad or failure about yourself  0 3     Trouble concentrating 0 3     Moving slowly or fidgety/restless  0 0     Suicidal thoughts 0 0     PHQ-9 Score 8 13     Difficult doing work/chores Somewhat difficult        Alcohol screening: Flowsheet Row Office Visit from 11/28/2022 in [facility]  AUDIT-C Score 1       Immunizations and Health Maintenance: Health Maintenance  Topic Date Due   INFLUENZA VACCINE  10/19/2022   COVID-19 Vaccine (4 - 2023-24 season) 12/14/2022 (Originally 11/19/2022)   MAMMOGRAM  02/16/2023   PAP SMEAR-Modifier  11/18/2023   DTaP/Tdap/Td (3 - Td or Tdap) 10/10/2025   Colonoscopy  11/08/2032   Hepatitis C Screening  Completed   HIV Screening  Completed   HPV VACCINES  Aged Out     Hep C Screening: done  STD testing and prevention (HIV/chl/gon/syphilis):  see above, no additional testing desired by pt today  Intimate partner violence:  Sexual History/Pain during Intercourse: Married  Menstrual History/LMP/Abnormal Bleeding: more irregular with IUD/mirena, more frequent, more spotting Patient's last menstrual period was 11/20/2022 (approximate).  Incontinence Symptoms: denies  Breast cancer: due in nov Last Mammogram: *see HM list above   Cervical cancer screening: UTD 10/2020 cotesting negative, due in 3-5 years, she is est  with GYN  Osteoporosis:   Discussion on osteoporosis per age, including high calcium and vitamin D supplementation, weight bearing exercises  Skin cancer:  Hx of skin CA -  NO Discussed atypical lesions   Colorectal cancer:   Colonoscopy is doneUTD, reviewed recent EGD/colonoscopy   Lung cancer:   Low Dose CT Chest recommended if Age 78-80 years, 20 pack-year currently smoking OR have quit w/in 15years. Patient does not qualify.    Social History   Tobacco Use   Smoking status: Never   Smokeless tobacco: Never  Vaping Use   Vaping status: Never Used  Substance Use Topics   Alcohol use: Yes    Comment: Ocassionally   Drug use: No     Flowsheet Row Office Visit from 11/28/2022 in [facility]  AUDIT-C Score 1       Family History  Problem Relation Age of Onset   Diabetes Mother    Pancreatitis Mother    Hypertension Mother    Alcohol abuse Mother    Schizophrenia Mother    Depression Mother    Heart disease Mother    Stroke Mother    Thyroid disease Mother    Kidney disease Mother    Anxiety disorder Mother    Alcohol abuse Father    Diabetes Sister        pre-diabetic   Obesity Sister    Diabetes Brother    Drug abuse Brother    Stroke Brother    ADD / ADHD Son    Intellectual disability Son    Learning disabilities Son    Eczema Son    Breast cancer Cousin        pat cousin   Anxiety disorder Other    Ovarian cancer Neg Hx      Blood pressure/Hypertension: BP Readings from Last 3 Encounters:  11/28/22 116/74  11/08/22 137/86  07/18/22 114/72    Weight/Obesity: Wt Readings from Last 3 Encounters:  11/28/22 245 lb (111.1 kg)  11/08/22 241 lb 3.2 oz (109.4 kg)  07/18/22 246 lb 9.6 oz (111.9 kg)   BMI Readings from Last 3 Encounters:  11/28/22 40.15 kg/m  11/08/22 41.40 kg/m  07/18/22 41.68 kg/m     Lipids:  Lab Results  Component Value Date   CHOL 250 (H) 11/08/2022   CHOL 170 06/01/2021   CHOL 176 05/04/2020   Lab Results  Component Value Date   HDL 47 11/08/2022   HDL 53 06/01/2021   HDL 61 05/04/2020   Lab Results  Component Value Date   LDLCALC 181 (H) 11/08/2022   LDLCALC 89 06/01/2021   LDLCALC 93 05/04/2020   Lab Results  Component Value Date   TRIG 124 11/08/2022   TRIG 163 (H) 06/01/2021   TRIG 123 05/04/2020   Lab Results  Component Value Date   CHOLHDL 5.3 (H) 11/08/2022   CHOLHDL 3.2 06/01/2021   CHOLHDL 3.7 03/31/2019   No results found for: "LDLDIRECT" Based on the results of lipid panel his/her cardiovascular risk factor ( using Poole Cohort )  in the next 10 years is: The 10-year ASCVD risk score (Arnett DK, et al., 2019) is: 1.1%   Values used to calculate the  score:     Age: 9 years     Sex: Female     Is Non-Hispanic African American: Yes     Diabetic: No     Tobacco smoker: No     Systolic Blood Pressure: 116 mmHg  Is BP treated: No     HDL Cholesterol: 47 mg/dL     Total Cholesterol: 250 mg/dL  Glucose:  Glucose  Date Value Ref Range Status  10/11/2021 97 70 - 99 mg/dL Final  40/98/1191 99 70 - 99 mg/dL Final  47/82/9562 130 (H) 70 - 99 mg/dL Final   Glucose, Bld  Date Value Ref Range Status  03/31/2019 159 (H) 65 - 139 mg/dL Final    Comment:    .        Non-fasting reference interval .   11/08/2018 85 65 - 99 mg/dL Final    Comment:    .            Fasting reference interval .   05/09/2018 111 (H) 65 - 99 mg/dL Final    Comment:    .            Fasting reference interval . For someone without known diabetes, a glucose value between 100 and 125 mg/dL is consistent with prediabetes and should be confirmed with a follow-up test. .     Advanced Care Planning:  A voluntary discussion about advance care planning including the explanation and discussion of advance directives.   Discussed health care proxy and Living will, and the patient was able to identify a health care proxy as husband .   Patient does not have a living will at present time.   Social History       Social History   Socioeconomic History   Marital status: Married    Spouse name: Nykita Muzzy   Number of children: 2   Years of education: Not on file   Highest education level: Bachelor's degree (e.g., BA, AB, BS)  Occupational History   Occupation: Charity fundraiser  Tobacco Use   Smoking status: Never   Smokeless tobacco: Never  Vaping Use   Vaping status: Never Used  Substance and Sexual Activity   Alcohol use: Yes    Comment: Ocassionally   Drug use: No   Sexual activity: Yes    Partners: Male    Birth control/protection: I.U.D., Other-see comments    Comment: Mirena  Other Topics Concern   Not on file  Social History Narrative    Not on file   Social Determinants of Health   Financial Resource Strain: Low Risk  (11/28/2022)   Overall Financial Resource Strain (CARDIA)    Difficulty of Paying Living Expenses: Not hard at all  Food Insecurity: No Food Insecurity (11/28/2022)   Hunger Vital Sign    Worried About Running Out of Food in the Last Year: Never true    Ran Out of Food in the Last Year: Never true  Transportation Needs: No Transportation Needs (11/28/2022)   PRAPARE - Administrator, Civil Service (Medical): No    Lack of Transportation (Non-Medical): No  Physical Activity: Inactive (11/28/2022)   Exercise Vital Sign    Days of Exercise per Week: 0 days    Minutes of Exercise per Session: 0 min  Stress: Stress Concern Present (11/28/2022)   [employer] of Occupational Health - Occupational Stress Questionnaire    Feeling of Stress : Very much  Social Connections: Moderately Integrated (11/28/2022)   Social Connection and Isolation Panel [NHANES]    Frequency of Communication with Friends and Family: More than three times a week    Frequency of Social Gatherings with Friends and Family: More than three times a week    Attends Religious Services: More than 4  times per year    Active Member of Clubs or Organizations: No    Attends Banker Meetings: Never    Marital Status: Married    Family History        Family History  Problem Relation Age of Onset   Diabetes Mother    Pancreatitis Mother    Hypertension Mother    Alcohol abuse Mother    Schizophrenia Mother    Depression Mother    Heart disease Mother    Stroke Mother    Thyroid disease Mother    Kidney disease Mother    Anxiety disorder Mother    Alcohol abuse Father    Diabetes Sister        pre-diabetic   Obesity Sister    Diabetes Brother    Drug abuse Brother    Stroke Brother    ADD / ADHD Son    Intellectual disability Son    Learning disabilities Son    Eczema Son    Breast cancer Cousin         pat cousin   Anxiety disorder Other    Ovarian cancer Neg Hx     Patient Active Problem List   Diagnosis Date Noted   High risk medication use 11/08/2022   At risk for nausea 10/11/2021   Prolonged menstruation 04/12/2021   Vertigo    Drug-induced constipation 05/24/2020   Other hyperlipidemia 05/24/2020   Insulin resistance 01/08/2020   Bipolar disorder, in full remission, most recent episode depressed (HCC) 12/23/2019   Heavy menstrual period 06/28/2019   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 01/01/2019   Bipolar II disorder, mild, depressed, with anxious distress (HCC) 09/05/2018   Insomnia due to mental condition 09/05/2018   Class 3 severe obesity with serious comorbidity and body mass index (BMI) of 40.0 to 44.9 in adult (HCC) 05/09/2018   Metabolic syndrome 10/27/2017   Acanthosis nigricans 10/22/2017   Moderate episode of recurrent major depressive disorder (HCC) 10/22/2017   Acid reflux 04/01/2017   Anxiety 06/01/2016   Morbid obesity with BMI of 40.0-44.9, adult (HCC) 03/12/2015   Peripheral neuropathy 10/09/2014   Hypothyroidism    IFG (impaired fasting glucose)    Vitamin D deficiency    Neuropathy     Past Surgical History:  Procedure Laterality Date   BREAST BIOPSY Left 2014   [place] bx/clip-neg   INTRAUTERINE DEVICE INSERTION  03/11/2012   TRIGGER FINGER RELEASE Right 04/21/2021     Current Outpatient Medications:    citalopram (CELEXA) 20 MG tablet, TAKE 1 TABLET BY MOUTH EVERY DAY, Disp: 90 tablet, Rfl: 1   EPINEPHrine 0.3 mg/0.3 mL IJ SOAJ injection, INJECT 0.3 MLS INTO THE MUSCLE ONCE FOR 1 DOSE. FOR LIFE-THREATENING ALLERGIC REACTION / ANAPHYLAXIS, Disp: , Rfl: 1   hydrOXYzine (VISTARIL) 25 MG capsule, TAKE 1-3 CAPSULES (25-75 MG TOTAL) BY MOUTH AT BEDTIME AS NEEDED. SLEEP AND ANXIETY, Disp: 270 capsule, Rfl: 1   lamoTRIgine (LAMICTAL) 150 MG tablet, TAKE 1 TABLET (150 MG TOTAL) BY MOUTH DAILY. TAKE ALONG WITH 25 MG DAILY, Disp: 90  tablet, Rfl: 1   lamoTRIgine (LAMICTAL) 25 MG tablet, Take 1 tablet (25 mg total) by mouth daily. Take along with 150 mg tab, Disp: 90 tablet, Rfl: 0   levonorgestrel (MIRENA, 52 MG,) 20 MCG/DAY IUD, Frequency:UNKNOWN   Dosage:0.0     Instructions:  Note:Dose: UNKNOWN, Disp: , Rfl:    levothyroxine (SYNTHROID) 100 MCG tablet, TAKE 1 TABLET (100 MCG TOTAL) BY MOUTH DAILY.  FOLLOW UP IN OFFICE IN 6-8 WEEKS FOR LABS/RECHECK FOR MORE REFILLS, Disp: 90 tablet, Rfl: 1   metFORMIN (GLUCOPHAGE-XR) 500 MG 24 hr tablet, Take 3 tablets (1,500 mg total) by mouth daily., Disp: 270 tablet, Rfl: 3   ondansetron (ZOFRAN) 4 MG tablet, Take 1 tablet (4 mg total) by mouth every 8 (eight) hours as needed for nausea or vomiting., Disp: 20 tablet, Rfl: 1   QUEtiapine (SEROQUEL) 25 MG tablet, Take 1 tablet (25 mg total) by mouth at bedtime., Disp: 90 tablet, Rfl: 0   traZODone (DESYREL) 50 MG tablet, TAKE 1 TO 2 TABLETS BY MOUTH AT BEDTIME AS NEEDED FOR SLEEP, Disp: 180 tablet, Rfl: 0   linaclotide (LINZESS) 145 MCG CAPS capsule, TAKE 1 CAPSULE BY MOUTH EVERY DAY BEFORE BREAKFAST, Disp: 90 capsule, Rfl: 3  Allergies  Allergen Reactions   Date Seed Extract  [Zizyphus Jujuba] Anaphylaxis, Cough, Itching and Swelling   Other Swelling    Patient Care Team: Kathryn Mail, DO as PCP - General (Internal Medicine) Eileen Stanford, MD as Referring Physician (Allergy and Immunology)   Chart Review: I personally reviewed active problem list, medication list, allergies, family history, social history, health maintenance, notes from last encounter, lab results, imaging with the patient/caregiver today.   Review of Systems  Constitutional: Negative.   HENT: Negative.    Eyes: Negative.   Respiratory: Negative.    Cardiovascular: Negative.   Gastrointestinal: Negative.   Endocrine: Negative.   Genitourinary: Negative.   Musculoskeletal: Negative.   Skin: Negative.   Allergic/Immunologic: Negative.   Neurological:  Negative.   Hematological: Negative.   Psychiatric/Behavioral: Negative.    All other systems reviewed and are negative.         Objective:   Vitals:  Vitals:   11/28/22 0846  BP: 116/74  Pulse: 73  Resp: 16  Temp: (!) 97.1 F (36.2 C)  TempSrc: Temporal  SpO2: 98%  Weight: 245 lb (111.1 kg)  Height: 5' 5.5" (1.664 m)    Body mass index is 40.15 kg/m.  Physical Exam Vitals and nursing note reviewed.  Constitutional:      General: She is not in acute distress.    Appearance: Normal appearance. She is well-developed. She is obese. She is not ill-appearing, toxic-appearing or diaphoretic.  HENT:     Head: Normocephalic and atraumatic.     Right Ear: External ear normal.     Left Ear: External ear normal.     Nose: Nose normal.  Eyes:     General: No scleral icterus.       Right eye: No discharge.        Left eye: No discharge.     Conjunctiva/sclera: Conjunctivae normal.  Neck:     Trachea: No tracheal deviation.  Cardiovascular:     Rate and Rhythm: Normal rate and regular rhythm.     Pulses: Normal pulses.     Heart sounds: Normal heart sounds.  Pulmonary:     Effort: Pulmonary effort is normal. No respiratory distress.     Breath sounds: Normal breath sounds. No stridor.  Abdominal:     General: Bowel sounds are normal.     Palpations: Abdomen is soft.  Skin:    General: Skin is warm and dry.     Findings: No rash.  Neurological:     Mental Status: She is alert.     Motor: No abnormal muscle tone.     Coordination: Coordination normal.     Gait: Gait normal.  Psychiatric:        Mood and Affect: Mood normal.        Behavior: Behavior normal.       Fall Risk:    11/28/2022    8:43 AM 04/13/2022    8:07 AM 02/16/2022    7:56 AM 11/22/2021    8:16 AM 11/17/2020    2:52 PM  Fall Risk   Falls in the past year? 0 0 0 0 0  Number falls in past yr: 0 0 0 0 0  Injury with Fall? 0 0 0 0 0  Risk for fall due to : No Fall Risks No Fall Risks No Fall  Risks No Fall Risks   Follow up Falls prevention discussed;Education provided;Falls evaluation completed Falls prevention discussed;Education provided;Falls evaluation completed Falls prevention discussed;Education provided;Falls evaluation completed Falls prevention discussed;Education provided     Functional Status Survey: Is the patient deaf or have difficulty hearing?: No Does the patient have difficulty seeing, even when wearing glasses/contacts?: No Does the patient have difficulty concentrating, remembering, or making decisions?: No Does the patient have difficulty walking or climbing stairs?: No Does the patient have difficulty dressing or bathing?: No Does the patient have difficulty doing errands alone such as visiting a doctor's office or shopping?: No   Assessment & Plan:    CPE completed today  USPSTF grade A and B recommendations reviewed with patient; age-appropriate recommendations, preventive care, screening tests, etc discussed and encouraged; healthy living encouraged; see AVS for patient education given to patient  Discussed importance of 150 minutes of physical activity weekly, AHA exercise recommendations given to pt in AVS/handout  Discussed importance of healthy diet:  eating lean meats and proteins, avoiding trans fats and saturated fats, avoid simple sugars and excessive carbs in diet, eat 6 servings of fruit/vegetables daily and drink plenty of water and avoid sweet beverages.    Recommended pt to do annual eye exam and routine dental exams/cleanings  Depression, alcohol, fall screening completed as documented above and per flowsheets  Advance Care planning information and packet discussed and offered today, encouraged pt to discuss with family members/spouse/partner/friends and complete Advanced directive packet and bring copy to office   Reviewed Health Maintenance: Health Maintenance  Topic Date Due   INFLUENZA VACCINE  10/19/2022   COVID-19 Vaccine (4 -  2023-24 season) 12/14/2022 (Originally 11/19/2022)   MAMMOGRAM  02/16/2023   PAP SMEAR-Modifier  11/18/2023   DTaP/Tdap/Td (3 - Td or Tdap) 10/10/2025   Colonoscopy  11/08/2032   Hepatitis C Screening  Completed   HIV Screening  Completed   HPV VACCINES  Aged Out    Immunizations: Immunization History  Administered Date(s) Administered   Influenza, Seasonal, Injecte, Preservative Fre 12/24/2006   Influenza,inj,Quad PF,6+ Mos 01/03/2017, 11/08/2017, 11/17/2020, 11/22/2021   PFIZER(Purple Top)SARS-COV-2 Vaccination 05/07/2019, 05/29/2019, 03/09/2020   Td 03/20/2004   Tdap 10/11/2015       ICD-10-CM   1. Annual physical exam  Z00.00 COMPLETE METABOLIC PANEL WITH GFR    CBC with Differential/Platelet    Lipid panel    Hemoglobin A1c    TSH    2. Encounter for screening mammogram for malignant neoplasm of breast  Z12.31 MM 3D SCREENING MAMMOGRAM BILATERAL BREAST    3. Drug-induced constipation  K59.03 linaclotide (LINZESS) 145 MCG CAPS capsule   requests refill on linzess     Additional concerns and problems addressed -    Metabolic syndrome and obesity - working with bariatric surgery at [facility] will consult with surgeon  very soon, hopes to have gastric bypass surgery before the end of the year  Labs done with specialist reviewed from earlier this year - A1C, Vit D, iron panel, B12 all in normal range   4. Other hyperlipidemia Labs recently showed very high cholesterol: Lab Results  Component Value Date   CHOL 250 (H) 11/08/2022   HDL 47 11/08/2022   LDLCALC 181 (H) 11/08/2022   TRIG 124 11/08/2022   CHOLHDL 5.3 (H) 11/08/2022   - COMPLETE METABOLIC PANEL WITH GFR - Lipid panel  5. Vitamin D deficiency Last labs normal but she stopped all supplements, she needs Rx dose intermittently, encouraged her to start a supplement esp going into fall/winter (bariatric supplements/nutrition and labs will also address this)  6. Metabolic syndrome See below - COMPLETE METABOLIC  PANEL WITH GFR - CBC with Differential/Platelet - Lipid panel - Hemoglobin A1c  7. Class 3 severe obesity with serious comorbidity and body mass index (BMI) of 40.0 to 44.9 in adult, unspecified obesity type (HCC) Working with specialists, doing RD, on metformin, has done diet and lifestyle changes, anticipating weight loss surgery, likely psychiatric meds are contributory to metabolic dysfunction and obesity  - COMPLETE METABOLIC PANEL WITH GFR - CBC with Differential/Platelet - Lipid panel - Hemoglobin A1c  8. Other specified hypothyroidism - TSH On levothyroxine 100 mcg - recently refilled by PCP, will see if labs in normal range and if so she can continue same dose - with anticipated surgery and weight loss - explained to her the labs and med dosing may change or need changing after surgery/weight loss - encouraged pcp f/up ~6 months   Danelle Berry, PA-C 11/28/22 9:07 AM  [facility]

## 2022-11-29 LAB — CBC WITH DIFFERENTIAL/PLATELET
Absolute Monocytes: 304 {cells}/uL (ref 200–950)
Basophils Absolute: 47 {cells}/uL (ref 0–200)
Basophils Relative: 0.6 %
Eosinophils Absolute: 133 {cells}/uL (ref 15–500)
Eosinophils Relative: 1.7 %
HCT: 37.9 % (ref 35.0–45.0)
Hemoglobin: 12.4 g/dL (ref 11.7–15.5)
Lymphs Abs: 2964 {cells}/uL (ref 850–3900)
MCH: 30.5 pg (ref 27.0–33.0)
MCHC: 32.7 g/dL (ref 32.0–36.0)
MCV: 93.3 fL (ref 80.0–100.0)
MPV: 9.3 fL (ref 7.5–12.5)
Monocytes Relative: 3.9 %
Neutro Abs: 4352 {cells}/uL (ref 1500–7800)
Neutrophils Relative %: 55.8 %
Platelets: 364 10*3/uL (ref 140–400)
RBC: 4.06 10*6/uL (ref 3.80–5.10)
RDW: 10.8 % — ABNORMAL LOW (ref 11.0–15.0)
Total Lymphocyte: 38 %
WBC: 7.8 10*3/uL (ref 3.8–10.8)

## 2022-11-29 LAB — COMPLETE METABOLIC PANEL WITH GFR
AG Ratio: 1.5 (calc) (ref 1.0–2.5)
ALT: 13 U/L (ref 6–29)
AST: 13 U/L (ref 10–35)
Albumin: 4.4 g/dL (ref 3.6–5.1)
Alkaline phosphatase (APISO): 69 U/L (ref 31–125)
BUN: 8 mg/dL (ref 7–25)
CO2: 28 mmol/L (ref 20–32)
Calcium: 10 mg/dL (ref 8.6–10.2)
Chloride: 103 mmol/L (ref 98–110)
Creat: 0.73 mg/dL (ref 0.50–0.99)
Globulin: 3 g/dL (ref 1.9–3.7)
Glucose, Bld: 122 mg/dL — ABNORMAL HIGH (ref 65–99)
Potassium: 4.7 mmol/L (ref 3.5–5.3)
Sodium: 141 mmol/L (ref 135–146)
Total Bilirubin: 0.7 mg/dL (ref 0.2–1.2)
Total Protein: 7.4 g/dL (ref 6.1–8.1)
eGFR: 103 mL/min/{1.73_m2} (ref >=60)

## 2022-11-29 LAB — LIPID PANEL
Cholesterol: 215 mg/dL — ABNORMAL HIGH (ref <200)
HDL: 46 mg/dL — ABNORMAL LOW (ref >=50)
LDL Cholesterol (Calc): 152 mg/dL — ABNORMAL HIGH
Non-HDL Cholesterol (Calc): 169 mg/dL — ABNORMAL HIGH (ref <130)
Total CHOL/HDL Ratio: 4.7 (calc) (ref <5.0)
Triglycerides: 71 mg/dL (ref <150)

## 2022-11-29 LAB — HEMOGLOBIN A1C
Hgb A1c MFr Bld: 5.7 %{Hb} — ABNORMAL HIGH (ref <5.7)
Mean Plasma Glucose: 117 mg/dL
eAG (mmol/L): 6.5 mmol/L

## 2022-11-29 LAB — TSH: TSH: 2.69 m[IU]/L

## 2022-12-07 ENCOUNTER — Other Ambulatory Visit: Payer: Self-pay | Admitting: Psychiatry

## 2022-12-07 DIAGNOSIS — F3181 Bipolar II disorder: Secondary | ICD-10-CM

## 2022-12-11 ENCOUNTER — Telehealth: Payer: BC Managed Care – PPO | Admitting: Psychiatry

## 2022-12-12 ENCOUNTER — Other Ambulatory Visit: Payer: Self-pay | Admitting: Psychiatry

## 2022-12-12 DIAGNOSIS — F5105 Insomnia due to other mental disorder: Secondary | ICD-10-CM

## 2022-12-29 ENCOUNTER — Encounter: Payer: Self-pay | Admitting: Psychiatry

## 2022-12-29 ENCOUNTER — Telehealth: Payer: BC Managed Care – PPO | Admitting: Psychiatry

## 2022-12-29 DIAGNOSIS — F5105 Insomnia due to other mental disorder: Secondary | ICD-10-CM

## 2022-12-29 DIAGNOSIS — Z79899 Other long term (current) drug therapy: Secondary | ICD-10-CM | POA: Diagnosis not present

## 2022-12-29 DIAGNOSIS — F429 Obsessive-compulsive disorder, unspecified: Secondary | ICD-10-CM

## 2022-12-29 DIAGNOSIS — F3181 Bipolar II disorder: Secondary | ICD-10-CM | POA: Diagnosis not present

## 2022-12-29 NOTE — Progress Notes (Signed)
Virtual Visit via Video Note  I connected with Kathryn Munoz on 12/29/22 at 10:00 AM EDT by a video enabled telemedicine application and verified that I am speaking with the correct person using two identifiers.  Location Provider Location : [facility] Patient Location : Work  Participants: Patient , Provider   I discussed the limitations of evaluation and management by telemedicine and the availability of in person appointments. The patient expressed understanding and agreed to proceed.    I discussed the assessment and treatment plan with the patient. The patient was provided an opportunity to ask questions and all were answered. The patient agreed with the plan and demonstrated an understanding of the instructions.   The patient was advised to call back or seek an in-person evaluation if the symptoms worsen or if the condition fails to improve as anticipated.   BH MD OP Progress Note  12/29/2022 12:22 PM VANESA RENIER  MRN:  [id number]  Chief Complaint:  Chief Complaint  Patient presents with   Follow-up   Anxiety   Depression   Medication Refill   HPI: Kathryn Munoz is a 47 year old African-American female, employed, lives in [place], has a history of bipolar disorder type II, insomnia, insulin resistance, hypothyroidism, other specified eating disorder was evaluated by telemedicine today.  Patient today reports she is currently improving on the current medication regimen.  The addition of Seroquel and stopping the trazodone has helped her.  She does not have a lot of racing thoughts irritability or mood swings at this time.  She does continue to have anxiety however likely mostly situational.  She reports her brother continues to struggle with his own health issues.  She had to learn to set some boundaries with that since it was bothering her too much.  She reports she also has work-related stressors since she works at a high demanding job.  She reports she is interested in  reestablishing care with the therapist since her therapist left the practice.  Patient reports sleep is overall good.  She denies any suicidality, homicidality or perceptual disturbances.  Patient currently denies any side effects to medications.  Patient denies any other concerns today.  Visit Diagnosis:    ICD-10-CM   1. Bipolar II disorder, mild, depressed, with anxious distress (HCC)  F31.81     2. Insomnia due to mental condition  F51.05    mood    3. 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  F42.9     4. High risk medication use  Z79.899       Past Psychiatric History: I have reviewed past psychiatric history from progress note on 11/08/2017.  Past trials of Celexa.  Past history of suicide attempts at the age of 3 by overdose on pills.  Another suicide attempt at the age of 20-slept through it, did not tell anyone at that time.  Past Medical History:  Past Medical History:  Diagnosis Date   Abnormal mammogram    breast biopsy, [facility] 2014   ADD (attention deficit disorder)    Allergy 2017   Anxiety    Bipolar 2 disorder (HCC)    Constipation    Depression    Dermatitis    GERD (gastroesophageal reflux disease) 2017   Heavy menstrual bleeding    History of gestational diabetes    History of kidney stones 2014   Hypothyroidism    IFG (impaired fasting glucose)    Insulin resistance    Irregular periods    Joint pain  Multiple food allergies    Dates and nuts   Neuropathy    Obesity    OCD (obsessive compulsive disorder)    Prediabetes    Vitamin D deficiency disease     Past Surgical History:  Procedure Laterality Date   BREAST BIOPSY Left 2014   [place] bx/clip-neg   INTRAUTERINE DEVICE INSERTION  03/11/2012   TRIGGER FINGER RELEASE Right 04/21/2021    Family Psychiatric History: I have reviewed family psychiatric history from progress note on 11/08/2017.  Family History:  Family History  Problem Relation Age of Onset   Diabetes Mother     Pancreatitis Mother    Hypertension Mother    Alcohol abuse Mother    Schizophrenia Mother    Depression Mother    Heart disease Mother    Stroke Mother    Thyroid disease Mother    Kidney disease Mother    Anxiety disorder Mother    Alcohol abuse Father    Diabetes Sister        pre-diabetic   Obesity Sister    Diabetes Brother    Drug abuse Brother    Stroke Brother    ADD / ADHD Son    Intellectual disability Son    Learning disabilities Son    Eczema Son    Breast cancer Cousin        pat cousin   Anxiety disorder Other    Ovarian cancer Neg Hx     Social History: I have reviewed social history from progress note on 11/08/2017. Social History   Socioeconomic History   Marital status: Married    Spouse name: Terrika Zuver   Number of children: 2   Years of education: Not on file   Highest education level: Bachelor's degree (e.g., BA, AB, BS)  Occupational History   Occupation: Charity fundraiser  Tobacco Use   Smoking status: Never   Smokeless tobacco: Never  Vaping Use   Vaping status: Never Used  Substance and Sexual Activity   Alcohol use: Yes    Comment: Ocassionally   Drug use: No   Sexual activity: Yes    Partners: Male    Birth control/protection: I.U.D., Other-see comments    Comment: Mirena  Other Topics Concern   Not on file  Social History Narrative   Not on file   Social Determinants of Health   Financial Resource Strain: Low Risk  (11/28/2022)   Overall Financial Resource Strain (CARDIA)    Difficulty of Paying Living Expenses: Not hard at all  Food Insecurity: No Food Insecurity (11/28/2022)   Hunger Vital Sign    Worried About Running Out of Food in the Last Year: Never true    Ran Out of Food in the Last Year: Never true  Transportation Needs: No Transportation Needs (11/28/2022)   PRAPARE - Administrator, Civil Service (Medical): No    Lack of Transportation (Non-Medical): No  Physical Activity: Inactive (11/28/2022)   Exercise  Vital Sign    Days of Exercise per Week: 0 days    Minutes of Exercise per Session: 0 min  Stress: Stress Concern Present (11/28/2022)   [employer] of Occupational Health - Occupational Stress Questionnaire    Feeling of Stress : Very much  Social Connections: Moderately Integrated (11/28/2022)   Social Connection and Isolation Panel [NHANES]    Frequency of Communication with Friends and Family: More than three times a week    Frequency of Social Gatherings with Friends and Family: More than three  times a week    Attends Religious Services: More than 4 times per year    Active Member of Clubs or Organizations: No    Attends Banker Meetings: Never    Marital Status: Married    Allergies:  Allergies  Allergen Reactions   Date Seed Extract  [Zizyphus Jujuba] Anaphylaxis, Cough, Itching and Swelling   Other Swelling    Metabolic Disorder Labs: Lab Results  Component Value Date   HGBA1C 5.7 (H) 11/28/2022   MPG 117 11/28/2022   MPG 126 04/13/2022   Lab Results  Component Value Date   PROLACTIN 10.9 11/08/2022   Lab Results  Component Value Date   CHOL 215 (H) 11/28/2022   TRIG 71 11/28/2022   HDL 46 (L) 11/28/2022   CHOLHDL 4.7 11/28/2022   VLDL 14 10/16/2016   LDLCALC 152 (H) 11/28/2022   LDLCALC 181 (H) 11/08/2022   Lab Results  Component Value Date   TSH 2.69 11/28/2022   TSH 1.84 04/13/2022    Therapeutic Level Labs: No results found for: "LITHIUM" No results found for: "VALPROATE" No results found for: "CBMZ"  Current Medications: Current Outpatient Medications  Medication Sig Dispense Refill   citalopram (CELEXA) 20 MG tablet TAKE 1 TABLET BY MOUTH EVERY DAY 90 tablet 1   EPINEPHrine 0.3 mg/0.3 mL IJ SOAJ injection INJECT 0.3 MLS INTO THE MUSCLE ONCE FOR 1 DOSE. FOR LIFE-THREATENING ALLERGIC REACTION / ANAPHYLAXIS  1   hydrOXYzine (VISTARIL) 25 MG capsule TAKE 1-3 CAPSULES (25-75 MG TOTAL) BY MOUTH AT BEDTIME AS NEEDED. SLEEP AND  ANXIETY 270 capsule 1   lamoTRIgine (LAMICTAL) 150 MG tablet TAKE 1 TABLET (150 MG TOTAL) BY MOUTH DAILY. TAKE ALONG WITH 25 MG DAILY 90 tablet 1   lamoTRIgine (LAMICTAL) 25 MG tablet TAKE 1 TABLET BY MOUTH EVERY DAY WITH 150MG  TABLET 90 tablet 0   levonorgestrel (MIRENA, 52 MG,) 20 MCG/DAY IUD Frequency:UNKNOWN   Dosage:0.0     Instructions:  Note:Dose: UNKNOWN     levothyroxine (SYNTHROID) 100 MCG tablet TAKE 1 TABLET (100 MCG TOTAL) BY MOUTH DAILY. FOLLOW UP IN OFFICE IN 6-8 WEEKS FOR LABS/RECHECK FOR MORE REFILLS 90 tablet 1   linaclotide (LINZESS) 145 MCG CAPS capsule TAKE 1 CAPSULE BY MOUTH EVERY DAY BEFORE BREAKFAST 90 capsule 3   metFORMIN (GLUCOPHAGE-XR) 500 MG 24 hr tablet Take 3 tablets (1,500 mg total) by mouth daily. 270 tablet 3   ondansetron (ZOFRAN) 4 MG tablet Take 1 tablet (4 mg total) by mouth every 8 (eight) hours as needed for nausea or vomiting. 20 tablet 1   QUEtiapine (SEROQUEL) 25 MG tablet Take 1 tablet (25 mg total) by mouth at bedtime. 90 tablet 0   No current facility-administered medications for this visit.     Musculoskeletal: Strength & Muscle Tone:  UTA Gait & Station:  Seated Patient leans: N/A  Psychiatric Specialty Exam: Review of Systems  Psychiatric/Behavioral:  The patient is nervous/anxious.     There were no vitals taken for this visit.There is no height or weight on file to calculate BMI.  General Appearance: Casual  Eye Contact:  Fair  Speech:  Clear and Coherent  Volume:  Normal  Mood:  Anxious  Affect:  Congruent  Thought Process:  Goal Directed and Descriptions of Associations: Intact  Orientation:  Full (Time, Place, and Person)  Thought Content: Logical   Suicidal Thoughts:  No  Homicidal Thoughts:  No  Memory:  Immediate;   Fair Recent;   Fair Remote;  Fair  Judgement:  Fair  Insight:  Good  Psychomotor Activity:  Normal  Concentration:  Concentration: Fair and Attention Span: Fair  Recall:  [name] of Knowledge: Fair   Language: Fair  Akathisia:  No  Handed:  Right  AIMS (if indicated): not done  Assets:  Communication Skills Desire for Improvement Housing Intimacy Social Support Talents/Skills Transportation  ADL's:  Intact  Cognition: WNL  Sleep:  Fair   Screenings: Midwife Visit from 11/08/2022 in [facility] Video Visit from 10/24/2021 in [facility] Video Visit from 07/27/2021 in [facility] Video Visit from 10/28/2020 in [facility]  AIMS Total Score 0 0 0 0      GAD-7    Flowsheet Row Office Visit from 11/28/2022 in [facility] Office Visit from 11/08/2022 in [facility] Office Visit from 04/13/2022 in [facility] Video Visit from 02/23/2022 in [facility] Office Visit from 11/22/2021 in [facility]  Total GAD-7 Score 21 18 0 11 0      PHQ2-9    Flowsheet Row Video Visit from 12/29/2022 in [facility] Office Visit from 11/28/2022 in [facility] Office Visit from 11/08/2022 in [facility] Counselor from 10/05/2022 in [facility] at [facility] Video Visit from 07/27/2022 in [facility] at [facility] Total Score 0 2 2 1 1   PHQ-9 Total Score -- 8 13 -- --      Flowsheet Row Video Visit from 12/29/2022 in [facility] Office Visit from 11/08/2022 in [facility] Counselor from 10/05/2022 in [facility] at [facility] RISK CATEGORY Low Risk Low Risk Low Risk        Assessment  and Plan: Kathryn Munoz is a 47 year old African-American female, married, employed, lives in [place], has a history of bipolar disorder, sleep problems, OCD, hypothyroidism was evaluated by telemedicine today.  Patient is currently improving on the current medication regimen, plan as noted below.  Plan Bipolar disorder type II depressed with anxious distress-in remission Lamotrigine 175 mg p.o. daily Celexa 20 mg p.o. daily-dose reduced due to sexual dysfunction Seroquel 25 mg p.o. nightly Discontinue trazodone.  OCD-stable Patient to reestablish care with a therapist, patient to call the clinic to reestablish care with our new incoming therapist Celexa 20 mg p.o. daily Seroquel as prescribed  Insomnia-improving Seroquel 25 mg p.o. nightly Discontinue trazodone since she is on Seroquel Hydroxyzine 25 to 75 mg at bedtime as needed Continue sleep hygiene techniques Pending sleep study report  High risk medication use-I have reviewed lipid panel-abnormal-patient to continue to follow up with primary care provider regarding this, hemoglobin A1c-5.7-improving, TSH-within normal limits.  Prolactin level-dated 11/08/2018 24-10.9-within normal limits.  Follow-up in clinic in 3 months or sooner   Collaboration of Care: Collaboration of Care: Referral or follow-up with counselor/therapist AEB patient encouraged to establish care with a therapist.  Patient/Guardian was advised Release of Information must be obtained prior to any record release in order to collaborate their care with an outside provider. Patient/Guardian was advised if they have not already done so to contact the registration department to sign all necessary forms in order for  [place] to release information regarding their care.   Consent: Patient/Guardian gives verbal consent for treatment and assignment of benefits for services provided during this visit. Patient/Guardian expressed understanding and agreed to proceed.   This note was  generated in part or whole with voice recognition software. Voice recognition is usually quite accurate but there are transcription errors that can and very often do occur. I apologize for any typographical errors that were not detected and corrected.      Jomarie Longs, MD 12/29/2022, 12:22 PM

## 2023-01-04 ENCOUNTER — Telehealth: Payer: BC Managed Care – PPO | Admitting: Psychiatry

## 2023-01-18 ENCOUNTER — Ambulatory Visit (INDEPENDENT_AMBULATORY_CARE_PROVIDER_SITE_OTHER): Payer: BC Managed Care – PPO | Admitting: Licensed Clinical Social Worker

## 2023-01-18 DIAGNOSIS — Z634 Disappearance and death of family member: Secondary | ICD-10-CM

## 2023-01-18 DIAGNOSIS — F3176 Bipolar disorder, in full remission, most recent episode depressed: Secondary | ICD-10-CM

## 2023-01-18 DIAGNOSIS — F331 Major depressive disorder, recurrent, moderate: Secondary | ICD-10-CM
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
## 2023-01-25 ENCOUNTER — Ambulatory Visit: Payer: BC Managed Care – PPO | Admitting: Licensed Clinical Social Worker

## 2023-01-25 DIAGNOSIS — F3181 Bipolar II disorder: Secondary | ICD-10-CM | POA: Diagnosis not present

## 2023-01-25 NOTE — Progress Notes (Signed)
THERAPIST PROGRESS NOTE  Session Time: 8:05am-9:00am  Participation Level: Active  Behavioral Response: NeatAlertDepressed  Type of Therapy: Individual Therapy  Treatment Goals addressed: Work with Myer Peer to identify the major components of a recent episode of depression: physical symptoms, major thoughts and images, and major behaviors they experienced   ProgressTowards Goals: Progressing  Interventions: Strength-based, Supportive, and Other: Inner Child Work   Summary: Kathryn Munoz is a 47 y.o. female who presents for follow-up with clinician.  Patient arrives on time and maintains appropriate eye contact throughout the session.  Patient presents with symptoms of depression anxiety citing irritability, behaviors of lashing out, panic attacks, uncontrollable worry, negative self affect.  Patient states she is "not doing so good "and states, "I am depressed."    Patient utilized therapeutic space to process recent stressors related to her brother's health and trouble at work.  Patient shared brother had 2 seizures and was rushed to the hospital, which is added stress as patient is his primary caregiver.  Patient reflected on difficulties at work in relation to communications with boss and anxiety about job security.  Patient identifies every time she drives into work she has panic attacks as evidenced by chest pain, racing heart, racing thoughts.  Patient demonstrated a positive mental shift that she identifies she has 2 interviews set up reporting she can only control what she can and is looking forward to a new start.    Patient became tearful processing stress about the results of the election and fear about the safety of her family as a result of possible policy changes.  Identified she is often anxious about her youngest son getting license next week and identifies triggered feelings as a result of her difficult pregnancies.  Again in session, patient demonstrated mental health shift  stating she is looking forward to her bariatric surgery and identifies it is no longer causing her worry.  Patient identifies her emotional eating has improved due to meal prepping.    Reflected emotional brain overpowering her logical brain and cites her husband is a good support because he helps ground her.  Reflected on coping skills patient identifies she is taking breaks at work, practicing positive affirmations, practicing positive mental shifts such as stating "I know it will pass "and "I have things I can control.  "  Reference inner child work.  Patient identifies her inner child feels "I do not have anybody that is proud of me or takes care of me."  Patient reflected on past memories that include a previous attempt to end her life at age 44 where she felt disregarded and minimized by her mother's response in the hospital.  Patient expressed hope about working with connecting to her and her child and becoming a supportive parent for herself; finding ways to provide validation for herself.  Patient reports she feels "good "after session citing it is helpful to have a safe space to share things that she does not get to share with her support system outside of therapy.   Suicidal/Homicidal: Yeswithout intent/plan Pt is able to identify positive factors.   Therapist Response: Clinician assessed for current stressors, symptoms, and safety since last session.  Clinician utilized active listening and validation to help patient feel heard and understood.  Provided a safe space for patient to express thoughts, feelings, and gain insight into her recent experience.  Clinician utilized supportive reflection to help patient identify positive reframing techniques.  Focused with patient the role of self compassion and recovery.  Empowered patient to take ownership of her healing journey and give herself grace.  Reinforced patient's progress made in identifying positive coping strategies, emotional vulnerability,  motivation for change, and successful use of re framing techniques.  Processed patient's relationship with her and her inner child and assisted patient in identifying common negative cognitions affiliated with experiences during childhood.  Explored coping skills.    Homework:Asked patient to begin to write a letter to her inner child for homework this week.  Introduced patient to EMDR resources to decrease frequency of panic attacks.  Plan: Return again in 2 weeks.  Diagnosis: Bipolar II disorder, mild, depressed, with anxious distress (HCC)   Collaboration of Care: Psychiatrist AEB   psychiatrist can access notes and cln. Will review psychiatrists' notes. Check in with the patient and will see LCSW per availability. Patient agreed with treatment recommendations. Pt. is scheduled for a follow-up in 2 weeks.   Patient/Guardian was advised Release of Information must be obtained prior to any record release in order to collaborate their care with an outside provider. Patient/Guardian was advised if they have not already done so to contact the registration department to sign all necessary forms in order for [place] to release information regarding their care.   Consent: Patient/Guardian gives verbal consent for treatment and assignment of benefits for services provided during this visit. Patient/Guardian expressed understanding and agreed to proceed.   Dereck Leep, LCSW 01/25/2023

## 2023-02-08 ENCOUNTER — Ambulatory Visit: Payer: BC Managed Care – PPO | Admitting: Licensed Clinical Social Worker

## 2023-02-08 DIAGNOSIS — F3181 Bipolar II disorder: Secondary | ICD-10-CM

## 2023-02-08 NOTE — Progress Notes (Addendum)
THERAPIST PROGRESS NOTE Virtual Visit via Video Note  I connected with Kathryn Munoz on 02/08/23 at  8:00 AM EST by a video enabled telemedicine application and verified that I am speaking with the correct person using two identifiers.  Location: Patient: Address on file Provider: Office   I discussed the limitations of evaluation and management by telemedicine and the availability of in person appointments. The patient expressed understanding and agreed to proceed.   I discussed the assessment and treatment plan with the patient. The patient was provided an opportunity to ask questions and all were answered. The patient agreed with the plan and demonstrated an understanding of the instructions.   The patient was advised to call back or seek an in-person evaluation if the symptoms worsen or if the condition fails to improve as anticipated.  I provided 55 minutes of non-face-to-face time during this encounter.   Dereck Leep, LCSW   Session Time: 7:59am-8:54am  Participation Level: Active  Behavioral Response: CasualAlertEuthymic  Type of Therapy: Individual Therapy  Treatment Goals addressed: LTG: Cecillia will stabilize mood and increase goal-directed behavior as measured by self report     STG: [place] will identify cognitive patterns and beliefs that support depression    ProgressTowards Goals: Progressing  Interventions: CBT, Motivational Interviewing, Solution Focused, and Supportive  Summary: [name] is a 47 y.o. female who presents with anxiety and depression citing symptoms of uncontrollable worry, irritability, tearfulness, lack of motivation, negative self affect.  Patient presents oriented x 5.  Patient was cooperative and engaged.  Patient denies SI/HI/AVH.  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  Patient reported her husband was able to help her identify what she can control and she used coping skills to reach out and ask for help.  Patient reports she became overwhelmed with the negative thought, "I failed."  Processed feelings related to being a caregiver and misplaced guilt regarding her time as a caretaker for her mother and the thought "Did I do enough?" Patient identified she has been avoiding her brother due to this misplaced guilt and worked with clinician to reframe these thoughts and give herself grace.  Reflected on inner child work and the cycle she feels she is trapped in.  Identified she wants to change her path to give herself forgiveness and set healthy boundaries.  Reflected on what her life could look like if she were to practice grace and healthy boundaries.  Patient identified she would have more time to take care of herself and her relationships would no longer be transactional.  Reflected on anger iceberg and identified she is feeling disappointed,hurt ,grieving, embarrassed,overwhelmed,and frustrated towards her brother.  Patient identified the first step in setting boundaries is to be honest with her brother in establishing healthy boundaries with him.  Through motivational interviewing, patient identified she has 10 out of 10 prepared and will engage in homework to write out what she would like to say to him.  Educated client on I feel. I need statements.  Patient identified she will take her husband with her to go visit her brother as support when having this conversation with him.  Suicidal/Homicidal: Nowithout intent/plan  Therapist Response: Clinician utilized supportive and active  listening to create a safe environment for patient to reflect on recent life stressors and symptoms.  Clinician utilized supportive reflection to assist patient in  processing negative cognitions and misplaced guilt related to recent life experiences.  Clinician utilized solution focused therapy to establish steps to take in order to set healthy boundaries and prioritize herself.  Challenged patient to reframe negative cognitions utilizing CBT techniques.  Utilized psychoeducation related to anger iceberg to reflect on true feelings driving anger towards her brother and addressed barriers related to me indicating her boundaries with her brother.  Utilize motivational interviewing to assess readiness for change.  Plan: Return again in 2 weeks.  Diagnosis: Bipolar II disorder, mild, depressed, with anxious distress (HCC)   Collaboration of Care: AEB psychiatrist can access notes and cln. Will review psychiatrists' notes. Check in with the patient and will see LCSW per availability. Patient agreed with treatment recommendations.   Patient/Guardian was advised Release of Information must be obtained prior to any record release in order to collaborate their care with an outside provider. Patient/Guardian was advised if they have not already done so to contact the registration department to sign all necessary forms in order for [place] to release information regarding their care.   Consent: Patient/Guardian gives verbal consent for treatment and assignment of benefits for services provided during this visit. Patient/Guardian expressed understanding and agreed to proceed.   Dereck Leep, LCSW 02/08/2023

## 2023-02-20 ENCOUNTER — Ambulatory Visit
Admission: RE | Admit: 2023-02-20 | Discharge: 2023-02-20 | Disposition: A | Discharge status: Home / Self Care | Payer: BC Managed Care – PPO | Source: Ambulatory Visit | Attending: Family Medicine | Admitting: Family Medicine

## 2023-02-20 DIAGNOSIS — Z1231 Encounter for screening mammogram for malignant neoplasm of breast: Secondary | ICD-10-CM | POA: Insufficient documentation

## 2023-02-21 ENCOUNTER — Ambulatory Visit (INDEPENDENT_AMBULATORY_CARE_PROVIDER_SITE_OTHER): Payer: BC Managed Care – PPO | Admitting: Licensed Clinical Social Worker

## 2023-02-21 DIAGNOSIS — F3181 Bipolar II disorder: Secondary | ICD-10-CM

## 2023-02-21 NOTE — Progress Notes (Signed)
THERAPIST PROGRESS NOTE Virtual Visit via Video Note  I connected with Kathryn Munoz on 02/21/23 at  8:00 AM EST by a video enabled telemedicine application and verified that I am speaking with the correct person using two identifiers.  Location: Patient: Joined from her car parked at [address] [place]  Provider: Office   I discussed the limitations of evaluation and management by telemedicine and the availability of in person appointments. The patient expressed understanding and agreed to proceed.   I discussed the assessment and treatment plan with the patient. The patient was provided an opportunity to ask questions and all were answered. The patient agreed with the plan and demonstrated an understanding of the instructions.   The patient was advised to call back or seek an in-person evaluation if the symptoms worsen or if the condition fails to improve as anticipated.  I provided 53 minutes of non-face-to-face time during this encounter.   Dereck Leep, LCSW   Session Time: 8:00am-8:53am  Participation Level: Active  Behavioral Response: CasualAlertEuthymic  Type of Therapy: Individual Therapy  Treatment Goals addressed: LTG: Kathryn Munoz will stabilize mood and increase goal-directed behavior as measured by self report      STG: Kathryn Munoz will identify cognitive patterns and beliefs that support depression   ProgressTowards Goals: Progressing  Interventions: CBT, Strength-based, and Supportive  Summary: Kathryn Munoz is a 47 y.o. female who presents with symptoms of anxiety and depression.  Patient identifies symptoms to include uncontrollable worry, irritability, tearfulness, lack of motivation, negative self affect.Pt was oriented times 5. Pt was cooperative and engaged. Pt denies SI/HI/AVH.     Utilized therapeutic space to process recent holiday season.  Processed relationship with her in-laws and avoidance that she feels towards spending time with  siblings this holiday season.  Patient reflected on previous session identifying intention to write letter to her brother and share her feelings with her husband.  Patient identified fear related to these conversations due to negative outcome of abandonment and rejection.  Patient also identifies feelings of depression interfere with her ability to communicate with family.  Processed feelings of anger towards siblings and reflected on previous events where she felt rejected by siblings.  Patient demonstrated progress related to being more aware of her pattern to minimize her feelings due to thinking she is a burden.  Assessed stress at work and upcoming meeting with boss.  Worked with patient to enforce assertive communication tactics and reviewed coping skills.  Suicidal/Homicidal: Nowithout intent/plan  Therapist Response: Utilized active and supportive reflection to create an environment for patient to process recent life stressors and symptoms.  Clinician assessed for current stressors, symptoms, safety since last session.  Cln utilized strength-based approach to assist in addressing patient's avoidance and feelings of stress around work going into a meeting with her boss.  Processed feelings of anger towards her siblings and fear of abandonment and rejection.  Utilized CBT techniques to assist in reframing patients negative cognitions about being a burden.  Plan: Return again in 2 weeks.  Diagnosis: Bipolar II disorder, mild, depressed, with anxious distress (HCC)   Collaboration of Care: [facility] psychiatrist can access notes and cln. Will review psychiatrists' notes. Check in with the patient and will see LCSW per availability. Patient agreed with treatment recommendations.   Patient/Guardian was advised Release of Information must be obtained prior to any record release in order to collaborate their care with an outside provider. Patient/Guardian was advised if they have not already done  so to  contact the registration department to sign all necessary forms in order for [place] to release information regarding their care.   Consent: Patient/Guardian gives verbal consent for treatment and assignment of benefits for services provided during this visit. Patient/Guardian expressed understanding and agreed to proceed.   Dereck Leep, LCSW 02/21/2023

## 2023-03-15 ENCOUNTER — Ambulatory Visit: Payer: BC Managed Care – PPO | Admitting: Licensed Clinical Social Worker

## 2023-03-15 DIAGNOSIS — F429 Obsessive-compulsive disorder, unspecified: Secondary | ICD-10-CM | POA: Diagnosis not present

## 2023-03-15 DIAGNOSIS — F3181 Bipolar II disorder: Secondary | ICD-10-CM | POA: Diagnosis not present

## 2023-03-15 NOTE — Progress Notes (Signed)
THERAPIST PROGRESS NOTE  Virtual Visit via Video Note  I connected with Kathryn Munoz on 03/15/23 at  8:00 AM EST by a video enabled telemedicine application and verified that I am speaking with the correct person using two identifiers.  Location: Patient: Address on File  Provider: [facility]   I discussed the limitations of evaluation and management by telemedicine and the availability of in person appointments. The patient expressed understanding and agreed to proceed.   I discussed the assessment and treatment plan with the patient. The patient was provided an opportunity to ask questions and all were answered. The patient agreed with the plan and demonstrated an understanding of the instructions.   The patient was advised to call back or seek an in-person evaluation if the symptoms worsen or if the condition fails to improve as anticipated.  I provided 46 minutes of non-face-to-face time during this encounter.   Dereck Leep, LCSW   Session Time: 8:00am-8:46am  Participation Level: Active  Behavioral Response: CasualAlertEuthymic  Type of Therapy: Individual Therapy  Treatment Goals addressed:  LTG: Alitzah will stabilize mood and increase goal-directed behavior as measured by self report      STG: Athyna will identify cognitive patterns and beliefs that support depression   ProgressTowards Goals: Progressing  Interventions: CBT and Supportive, Strength Based Approach   Summary: Kathryn Munoz is a 47 y.o. female who presents with symptoms of anxiety.  Patient reports uncontrollable worry, insomnia, fatigue. Pt was oriented times 5. Pt was cooperative and engaged. Pt denies SI/HI/AVH.     Patient utilized therapeutic space to process recent bariatric surgery.  Patient reports she sees this opportunity as a "fresh start."  Discussed leave from work and feelings about returning to work.  Processed the pros and cons for extending her FMLA.  Patient acknowledged she  feels less stressed and has come around to some realizations that prioritizing both her physical and mental wellbeing is her main priority.  Processed ways in which patient has been giving herself grace and discussed her relationship with her brother.  Patient acknowledges the toll it has taken on her to serve as the main support for her family.  Discussed ways in which patient is setting and maintaining healthy boundaries with her siblings.  Patient identifies she plans to continue to prioritize her peace going into the new year.  Continued to explore patient's coping skills and discussed patient's relationship with food.  Patient identified an ability to "approach "her thoughts in a different way due to an inability to rely on food to cope with situations postsurgery.  Patient reports she will continue to work on her dynamics with food and challenge herself to seek out healthier options when coping with stressful situations.  Patient identifies a goal to walk 30 minutes a day citing and excitement about joining a gym.  Patient reports she is looking forward to tapping into motivation to improve her physical wellbeing so that she can spend more time with her sons.  Patient reports she will continue her job search in the coming weeks.  Identified she is giving herself time to be present and distract herself from anxious symptoms related to returning to work.  Suicidal/Homicidal: Nowithout intent/plan  Therapist Response: Utilized active and supportive reflection to create an environment for patient to process recent life stressors and symptoms. Clinician assessed for current stressors, symptoms, safety since last session.  Utilized CBT techniques to reinforce positive thoughts about prioritizing her wellbeing.  Supported patient in identifying  her strengths and setting boundaries and her progress and giving herself grace.  Supported patient in processing feelings about returning back to work.  Plan: Return  again in 2 weeks.  Diagnosis: Bipolar II disorder, mild, depressed, with anxious distress (HCC)  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    Collaboration of Care: A[facility] psychiatrist can access notes and cln. Will review psychiatrists' notes. Check in with the patient and will see LCSW per availability. Patient agreed with treatment recommendations.   Patient/Guardian was advised Release of Information must be obtained prior to any record release in order to collaborate their care with an outside provider. Patient/Guardian was advised if they have not already done so to contact the registration department to sign all necessary forms in order for [place] to release information regarding their care.   Consent: Patient/Guardian gives verbal consent for treatment and assignment of benefits for services provided during this visit. Patient/Guardian expressed understanding and agreed to proceed.   Dereck Leep, LCSW 03/15/2023

## 2023-03-29 ENCOUNTER — Ambulatory Visit (INDEPENDENT_AMBULATORY_CARE_PROVIDER_SITE_OTHER): Payer: 59 | Admitting: Licensed Clinical Social Worker

## 2023-03-29 DIAGNOSIS — F3181 Bipolar II disorder: Secondary | ICD-10-CM

## 2023-03-29 DIAGNOSIS — F429 Obsessive-compulsive disorder, unspecified: Secondary | ICD-10-CM

## 2023-03-29 NOTE — Progress Notes (Signed)
 THERAPIST PROGRESS NOTE  Virtual Visit via Video Note  I connected with Kathryn Munoz on 03/29/23 at  8:00 AM EST by a video enabled telemedicine application and verified that I am speaking with the correct person using two identifiers.  Location: Patient: Address on file  Provider: [facility]   I discussed the limitations of evaluation and management by telemedicine and the availability of in person appointments. The patient expressed understanding and agreed to proceed.    I discussed the assessment and treatment plan with the patient. The patient was provided an opportunity to ask questions and all were answered. The patient agreed with the plan and demonstrated an understanding of the instructions.   The patient was advised to call back or seek an in-person evaluation if the symptoms worsen or if the condition fails to improve as anticipated.  I provided 48 minutes of non-face-to-face time during this encounter.   Kathryn Munoz KATHEE Husband, LCSW   Session Time: 8am-8:48am  Participation Level: Active  Behavioral Response: CasualAlertEuthymic  Type of Therapy: Individual Therapy  Treatment Goals addressed: LTG: Kathryn Munoz will stabilize mood and increase goal-directed behavior as measured by self report      STG: Kathryn Munoz will identify cognitive patterns and beliefs that support depression   ProgressTowards Goals: Progressing  Interventions: Solution Focused, Strength-based, Assertiveness Training, and Supportive  Summary: Kathryn Munoz is a 48 y.o. female who presents with symptoms of anxiety and depression.  Patient identifies symptoms to include uncontrollable worry, negative self affect, and anxious feelings.Pt was oriented times 5. Pt was cooperative and engaged. Pt denies SI/HI/AVH.      Patient utilized therapeutic space to process recent surgical procedure and time off work.  Patient identified a shift in her attitude citing she did not realize the benefits of taking time  from work and feels she is starting her new year with a positive attitude.  Patient cites improvements with the relationship with her manager and expressed pride in her ability to utilize assertive communication while at work.  Continue to process work stressors and patient's upcoming interview.  Worked with clinician to identify strengths both individually and addressed a leader.  Worked with clinician to identify growth and ways in which patient can eat expressed her journey with mental health and lessons learned in transitioning into a leadership role.  Patient reports going into this interview she feels very confident in herself and her capabilities to lead others.  Patient identified future goals to continue to take vacation days as she deserves to do so and aspires to be proactive in scheduling mental health days for herself.  Patient reflected on the support of her husband and opportunity to spend quality time with her family over the holidays.  Patient identified and processed feelings related to limited contact with her brother since the holidays.  Patient continued to demonstrate grace and acknowledging what she can and cannot control as well as identifying ways in which she and her sister can support her brother during this time.  Discussed efforts and goals to supporting her husband and getting involved with therapeutic services.  Suicidal/Homicidal: Nowithout intent/plan  Therapist Response: Utilized active and supportive reflection to create an environment for patient to process recent life stressors and symptoms. Clinician assessed for current stressors, symptoms, safety since last session. Cln utilized a strengths based approach to assist in patient reflecting on her skills and values going into an interview tomorrow. Reflected on ways pt can and has utilized assertiveness to advocate for her  needs at work. Worked together to process topics to share in her interview and ways she can support  her brother in improving his MH.   Plan: Return again in 2 weeks.  Diagnosis: Bipolar II disorder, mild, depressed, with anxious distress (HCC)  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    Collaboration of Care: "[facility] psychiatrist can access notes and cln. Will review psychiatrists' notes. Check in with the patient and will see LCSW per availability. Patient agreed with treatment recommendations.   Patient/Guardian was advised Release of Information must be obtained prior to any record release in order to collaborate their care with an outside provider. Patient/Guardian was advised if they have not already done so to contact the registration department to sign all necessary forms in order for us  to release information regarding their care.   Consent: Patient/Guardian gives verbal consent for treatment and assignment of benefits for services provided during this visit. Patient/Guardian expressed understanding and agreed to proceed.   Kathryn Munoz KATHEE Husband, LCSW 03/29/2023

## 2023-03-30 ENCOUNTER — Encounter: Payer: Self-pay | Admitting: Psychiatry

## 2023-03-30 ENCOUNTER — Telehealth (INDEPENDENT_AMBULATORY_CARE_PROVIDER_SITE_OTHER): Payer: BC Managed Care – PPO | Admitting: Psychiatry

## 2023-03-30 DIAGNOSIS — F429 Obsessive-compulsive disorder, unspecified: Secondary | ICD-10-CM

## 2023-03-30 DIAGNOSIS — F3181 Bipolar II disorder: Secondary | ICD-10-CM

## 2023-03-30 DIAGNOSIS — F5105 Insomnia due to other mental disorder: Secondary | ICD-10-CM

## 2023-03-30 MED ORDER — LAMOTRIGINE 25 MG PO TABS: 25.0000 mg | ORAL_TABLET | Freq: Every day | ORAL | 1 refills | Status: DC

## 2023-03-30 MED ORDER — CITALOPRAM HYDROBROMIDE 20 MG PO TABS: 20.0000 mg | ORAL_TABLET | Freq: Every day | ORAL | 1 refills | Status: DC

## 2023-03-30 MED ORDER — QUETIAPINE FUMARATE 25 MG PO TABS: 25.0000 mg | ORAL_TABLET | Freq: Every day | ORAL | 0 refills | Status: DC

## 2023-03-30 MED ORDER — LAMOTRIGINE 150 MG PO TABS: ORAL_TABLET | ORAL | 1 refills | Status: DC

## 2023-03-30 NOTE — Progress Notes (Signed)
 Virtual Visit via Video Note  I connected with Kathryn Munoz on 03/30/23 at 10:00 AM EST by a video enabled telemedicine application and verified that I am speaking with the correct person using two identifiers.  Location Provider Location : [facility] Patient Location : Home  Participants: Patient , Provider   I discussed the limitations of evaluation and management by telemedicine and the availability of in person appointments. The patient expressed understanding and agreed to proceed.   I discussed the assessment and treatment plan with the patient. The patient was provided an opportunity to ask questions and all were answered. The patient agreed with the plan and demonstrated an understanding of the instructions.   The patient was advised to call back or seek an in-person evaluation if the symptoms worsen or if the condition fails to improve as anticipated.   BH MD OP Progress Note  03/30/2023 1:00 PM [name]  MRN:  [id number]  Chief Complaint:  Chief Complaint  Patient presents with   Anxiety   Depression   Medication Refill   HPI: Kathryn Munoz is a 48 year old African-American female, employed, lives [place], has a history of bipolar disorder, insomnia, insulin  resistance, hypothyroidism, OCD, recent gastric bypass surgery was evaluated by telemedicine today.  The patient with recent gastric bypass surgery, reports a generally positive state of mental health, with most stressors being work-related. She underwent gastric bypass surgery on December 18th, with no complications reported. Since the surgery, she has lost approximately 20 pounds and reports feeling great. She has been adhering to a soft food diet post-surgery and is due for a follow-up with her bariatric provider.  The patient's primary concern is a change in sleep patterns. She reports going to bed before 10 PM and waking up around 3 AM consistently. Although she is able to fall back asleep within 10-15  minutes, she expresses a desire for uninterrupted sleep throughout the night. She has been taking Seroquel  for sleep and has considered adjusting the timing of her sleep schedule to achieve deeper sleep.  In terms of her bipolar symptoms, the patient reports a stable mood with no significant symptoms of anxiety or depression. She has been attending therapy sessions with Tawni Husband and reports a positive experience. However, she expresses some stress related to her brother's health, who is currently in a nursing home and experiencing depression.  The patient's medication regimen includes Seroquel  25 mg at bedtime, Lamictal  175 mg daily, and hydroxyzine  25 to 75 mg as needed. She has stopped taking metformin  extended release since her hospital stay for the gastric bypass surgery. She has not been taking hydroxyzine  recently due to concerns about its compatibility with her post-surgery condition.  Patient denies suicidality, homicidality or perceptual disturbances.  Visit Diagnosis:    ICD-10-CM   1. Bipolar II disorder in full remission (HCC)  F31.81 lamoTRIgine  (LAMICTAL ) 25 MG tablet   most recent depressed     2. 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  F42.9 citalopram  (CELEXA ) 20 MG tablet    lamoTRIgine  (LAMICTAL ) 150 MG tablet    3. Insomnia due to mental condition  F51.05 QUEtiapine  (SEROQUEL ) 25 MG tablet   mood      Past Psychiatric History: I have reviewed past psychiatric history from progress note on 11/08/2017.  Past trials of Celexa .  Suicide attempts at the age of 47 by overdose on pills.  Another attempt at the age of 20-slept through it, did not tell anyone.  Past Medical  History:  Past Medical History:  Diagnosis Date   Abnormal mammogram    breast biopsy, [facility] 2014   ADD (attention deficit disorder)    Allergy 2017   Anxiety    Bipolar 2 disorder (HCC)    Constipation    Depression    Dermatitis    GERD (gastroesophageal reflux disease) 2017    Heavy menstrual bleeding    History of gestational diabetes    History of kidney stones 2014   Hypothyroidism    IFG (impaired fasting glucose)    Insulin  resistance    Irregular periods    Joint pain    Multiple food allergies    Dates and nuts   Neuropathy    Obesity    OCD (obsessive compulsive disorder)    Prediabetes    Vitamin D  deficiency disease     Past Surgical History:  Procedure Laterality Date   BREAST BIOPSY Left 2014   us  bx/clip-neg   INTRAUTERINE DEVICE INSERTION  03/11/2012   TRIGGER FINGER RELEASE Right 04/21/2021    Family Psychiatric History: I have reviewed family psychiatric history from progress note on 11/08/2017.  Family History:  Family History  Problem Relation Age of Onset   Diabetes Mother    Pancreatitis Mother    Hypertension Mother    Alcohol abuse Mother    Schizophrenia Mother    Depression Mother    Heart disease Mother    Stroke Mother    Thyroid  disease Mother    Kidney disease Mother    Anxiety disorder Mother    Alcohol abuse Father    Diabetes Sister        pre-diabetic   Obesity Sister    Diabetes Brother    Drug abuse Brother    Stroke Brother    ADD / ADHD Son    Intellectual disability Son    Learning disabilities Son    Eczema Son    Breast cancer Cousin        pat cousin   Anxiety disorder Other    Ovarian cancer Neg Hx     Social History: I have reviewed social history from progress note on 11/08/2017. Social History   Socioeconomic History   Marital status: Married    Spouse name: Xochil Shanker   Number of children: 2   Years of education: Not on file   Highest education level: Bachelor's degree (e.g., BA, AB, BS)  Occupational History   Occupation: Charity Fundraiser  Tobacco Use   Smoking status: Never   Smokeless tobacco: Never  Vaping Use   Vaping status: Never Used  Substance and Sexual Activity   Alcohol use: Yes    Comment: Ocassionally   Drug use: No   Sexual activity: Yes    Partners: Male     Birth control/protection: I.U.D., Other-see comments    Comment: Mirena   Other Topics Concern   Not on file  Social History Narrative   Not on file   Social Drivers of Health   Financial Resource Strain: Low Risk  (11/28/2022)   Overall Financial Resource Strain (CARDIA)    Difficulty of Paying Living Expenses: Not hard at all  Food Insecurity: No Food Insecurity (11/28/2022)   Hunger Vital Sign    Worried About Running Out of Food in the Last Year: Never true    Ran Out of Food in the Last Year: Never true  Transportation Needs: No Transportation Needs (11/28/2022)   PRAPARE - Administrator, Civil Service (Medical): No  Lack of Transportation (Non-Medical): No  Physical Activity: Inactive (11/28/2022)   Exercise Vital Sign    Days of Exercise per Week: 0 days    Minutes of Exercise per Session: 0 min  Stress: Stress Concern Present (11/28/2022)   [employer] of Occupational Health - Occupational Stress Questionnaire    Feeling of Stress : Very much  Social Connections: Moderately Integrated (11/28/2022)   Social Connection and Isolation Panel [NHANES]    Frequency of Communication with Friends and Family: More than three times a week    Frequency of Social Gatherings with Friends and Family: More than three times a week    Attends Religious Services: More than 4 times per year    Active Member of [employer] or Organizations: No    Attends Banker Meetings: Never    Marital Status: Married    Allergies:  Allergies  Allergen Reactions   Date Seed Extract  [Zizyphus Jujuba] Anaphylaxis, Cough, Itching and Swelling   Other Swelling    Metabolic Disorder Labs: Lab Results  Component Value Date   HGBA1C 5.7 (H) 11/28/2022   MPG 117 11/28/2022   MPG 126 04/13/2022   Lab Results  Component Value Date   PROLACTIN 10.9 11/08/2022   Lab Results  Component Value Date   CHOL 215 (H) 11/28/2022   TRIG 71 11/28/2022   HDL 46 (L) 11/28/2022    CHOLHDL 4.7 11/28/2022   VLDL 14 10/16/2016   LDLCALC 152 (H) 11/28/2022   LDLCALC 181 (H) 11/08/2022   Lab Results  Component Value Date   TSH 2.69 11/28/2022   TSH 1.84 04/13/2022    Therapeutic Level Labs: No results found for: LITHIUM No results found for: VALPROATE No results found for: CBMZ  Current Medications: Current Outpatient Medications  Medication Sig Dispense Refill   docusate (COLACE) 50 MG/5ML liquid Take by mouth.     magnesium hydroxide (MILK OF MAGNESIA) 400 MG/5ML suspension Take by mouth.     omeprazole  (PRILOSEC) 20 MG capsule Take by mouth.     ondansetron  (ZOFRAN -ODT) 4 MG disintegrating tablet Take by mouth.     simethicone (GAS RELIEF INFANTS) 40 MG/0.6ML drops Take by mouth.     citalopram  (CELEXA ) 20 MG tablet Take 1 tablet (20 mg total) by mouth daily. 90 tablet 1   EPINEPHrine  0.3 mg/0.3 mL IJ SOAJ injection INJECT 0.3 MLS INTO THE MUSCLE ONCE FOR 1 DOSE. FOR LIFE-THREATENING ALLERGIC REACTION / ANAPHYLAXIS  1   hydrOXYzine  (VISTARIL ) 25 MG capsule TAKE 1-3 CAPSULES (25-75 MG TOTAL) BY MOUTH AT BEDTIME AS NEEDED. SLEEP AND ANXIETY 270 capsule 1   lamoTRIgine  (LAMICTAL ) 150 MG tablet TAKE 1 TABLET (150 MG TOTAL) BY MOUTH DAILY. TAKE ALONG WITH 25 MG DAILY 90 tablet 1   lamoTRIgine  (LAMICTAL ) 25 MG tablet Take 1 tablet (25 mg total) by mouth daily. Take along with 150 mg daily 90 tablet 1   levonorgestrel  (MIRENA , 52 MG,) 20 MCG/DAY IUD Frequency:UNKNOWN   Dosage:0.0     Instructions:  Note:Dose: UNKNOWN     levothyroxine  (SYNTHROID ) 100 MCG tablet TAKE 1 TABLET (100 MCG TOTAL) BY MOUTH DAILY. FOLLOW UP IN OFFICE IN 6-8 WEEKS FOR LABS/RECHECK FOR MORE REFILLS 90 tablet 1   linaclotide  (LINZESS ) 145 MCG CAPS capsule TAKE 1 CAPSULE BY MOUTH EVERY DAY BEFORE BREAKFAST 90 capsule 3   metFORMIN  (GLUCOPHAGE -XR) 500 MG 24 hr tablet Take 3 tablets (1,500 mg total) by mouth daily. (Patient not taking: Reported on 03/30/2023) 270 tablet 3  ondansetron  (ZOFRAN )  4 MG tablet Take 1 tablet (4 mg total) by mouth every 8 (eight) hours as needed for nausea or vomiting. 20 tablet 1   QUEtiapine  (SEROQUEL ) 25 MG tablet Take 1 tablet (25 mg total) by mouth at bedtime. 90 tablet 0   No current facility-administered medications for this visit.     Musculoskeletal: Strength & Muscle Tone:  UTA Gait & Station:  Seated Patient leans:  UTA  Psychiatric Specialty Exam: Review of Systems  Psychiatric/Behavioral:  Positive for sleep disturbance. The patient is nervous/anxious.     There were no vitals taken for this visit.There is no height or weight on file to calculate BMI.  General Appearance: Casual  Eye Contact:  Fair  Speech:  Clear and Coherent  Volume:  Normal  Mood:  Anxious situational  Affect:  Appropriate  Thought Process:  Goal Directed and Descriptions of Associations: Intact  Orientation:  Full (Time, Place, and Person)  Thought Content: Logical   Suicidal Thoughts:  No  Homicidal Thoughts:  No  Memory:  Immediate;   Fair Recent;   Fair Remote;   Fair  Judgement:  Fair  Insight:  Fair  Psychomotor Activity:  Normal  Concentration:  Concentration: Fair and Attention Span: Fair  Recall:  [name] of Knowledge: Fair  Language: Fair  Akathisia:  No  Handed:  Right  AIMS (if indicated): not done  Assets:  Desire for Improvement Housing Social Support  ADL's:  Intact  Cognition: WNL  Sleep:   Restless at times   Screenings: AIMS    Flowsheet Row Office Visit from 11/08/2022 in [facility] Video Visit from 10/24/2021 in [facility] Video Visit from 07/27/2021 in [facility] Video Visit from 10/28/2020 in [facility]  AIMS Total Score 0 0 0 0      GAD-7    Flowsheet Row Counselor from 01/18/2023 in [facility] Office Visit from  11/28/2022 in [facility] Visit from 11/08/2022 in [facility] Office Visit from 04/13/2022 in [facility] Video Visit from 02/23/2022 in [facility]  Total GAD-7 Score 17 21 18  0 11      PHQ2-9    Flowsheet Row Video Visit from 03/30/2023 in [facility] Counselor from 01/18/2023 in [facility] Video Visit from 12/29/2022 in [facility] Office Visit from 11/28/2022 in [facility] Office Visit from 11/08/2022 in [facility]  PHQ-2 Total Score 0 2 0 2 2  PHQ-9 Total Score -- 14 -- 8 13      Flowsheet Row Video Visit from 03/30/2023 in [facility] Counselor from 01/18/2023 in [facility] Video Visit from 12/29/2022 in [facility]  C-SSRS RISK CATEGORY Moderate Risk Error: Q7 should not be populated when Q6 is No Low Risk        Assessment and Plan: Kathryn Munoz is a 48 year old African-American female, married, employed, lives in [place], has a history of bipolar disorder, sleep problems, OCD, hypothyroidism was evaluated by telemedicine today.  Bipolar Disorder in remission Monda reports well-managed mood with no significant symptoms of anxiety or depression. She is taking Seroquel  25 mg at bedtime,  Lamictal  175 mg daily, and hydroxyzine  25-75 mg as needed, though she has not been using hydroxyzine . She needs to confirm with her bariatric provider if hydroxyzine  is safe to continue. She is also in therapy with Tawni Husband and has had several appointments. No thoughts of self-harm or harm to others reported.   - Send Seroquel  25 mg to pharmacy for  a 90-day supply   - Send Lamictal  175 mg to pharmacy for a 90-day supply   - Confirm with bariatric provider about continuing hydroxyzine    - Continue therapy with Evalene Husband    Obsessive-compulsive disorder-stable Currently denies any significant concerns - Continue psychotherapy with Ms. Perkins. - Continue Celexa  20 mg daily.  Sleep Disturbance-unstable Aideliz reports waking up around 3 AM but can fall back asleep within 10-15 minutes. She is taking Seroquel  for sleep. Discussed adding an over-the-counter sleep aid like Qunol's melatonin combination if sleep does not improve after returning to her work routine. Emphasized monitoring sleep patterns and adjusting treatment based on her work routine.   - Monitor sleep patterns after returning to work   - Consider adding over-the-counter sleep aid if sleep does not improve  - Hydroxyzine  25 to 75 mg at bedtime as needed.    Collaboration of Care: Collaboration of Care: Referral or follow-up with counselor/therapist AEB patient encouraged to continue follow-up with therapist as well as bariatric provider.  Patient/Guardian was advised Release of Information must be obtained prior to any record release in order to collaborate their care with an outside provider. Patient/Guardian was advised if they have not already done so to contact the registration department to sign all necessary forms in order for us  to release information regarding their care.   Consent: Patient/Guardian gives verbal consent for treatment and assignment of benefits for services provided during this visit. Patient/Guardian expressed understanding and agreed to proceed.   Follow-up in clinic in 2 to 3 months or sooner if needed.  This note was generated in part or whole with voice recognition software. Voice recognition is usually quite accurate but there are transcription errors that can and very often do occur. I apologize for any typographical errors that were not  detected and corrected.    Latriece Anstine, MD 03/30/2023, 1:00 PM

## 2023-04-13 ENCOUNTER — Ambulatory Visit (INDEPENDENT_AMBULATORY_CARE_PROVIDER_SITE_OTHER): Payer: 59 | Admitting: Licensed Clinical Social Worker

## 2023-04-13 DIAGNOSIS — F429 Obsessive-compulsive disorder, unspecified: Secondary | ICD-10-CM

## 2023-04-13 DIAGNOSIS — F3181 Bipolar II disorder: Secondary | ICD-10-CM

## 2023-04-13 NOTE — Progress Notes (Signed)
THERAPIST PROGRESS NOTE  Virtual Visit via Video Note  I connected with Kathryn Munoz on 04/13/23 at 11:00 AM EST by a video enabled telemedicine application and verified that I am speaking with the correct person using two identifiers.  Location: Patient: Work  Provider: Providers home   I discussed the limitations of evaluation and management by telemedicine and the availability of in person appointments. The patient expressed understanding and agreed to proceed.   I discussed the assessment and treatment plan with the patient. The patient was provided an opportunity to ask questions and all were answered. The patient agreed with the plan and demonstrated an understanding of the instructions.   The patient was advised to call back or seek an in-person evaluation if the symptoms worsen or if the condition fails to improve as anticipated.  I provided 49 minutes of non-face-to-face time during this encounter.   Dereck Leep, LCSW   Session Time: 11-11:49am  Participation Level: Active  Behavioral Response: CasualAlertEuthymic  Type of Therapy: Individual Therapy  Treatment Goals addressed: LTG: Kathryn Munoz will stabilize mood and increase goal-directed behavior as measured by self report      STG: Kathryn Munoz will identify cognitive patterns and beliefs that support depression   ProgressTowards Goals: Progressing  Interventions: CBT, Solution Focused, Supportive, Reframing, and Other: Inner Child Work   Summary:  Kathryn Munoz is a 48 y.o. female who presents with symptoms of anxiety and depression.  Patient identifies symptoms to include uncontrollable worry, negative self affect, and anxious feelings.Pt was oriented times 5. Pt was cooperative and engaged. Pt denies SI/HI/AVH.      Pt utilized therapeutic space to process recent increase in sxs of anxiety.  Pt identifies sxs to include unwanted negative thoughts about how other perceive her and anxious feelings. Pt  reflected on growth citing emotional maturity. Identified her interview process is going well and she feels she is being optimistic / hopeful about future opportunities. Shares she has noticed improvements as she feels she would be "pretty depressed" with her current situation but notes improvement in her mood. Explored what she has learned about herself through the process of applying and interviewing. Pt continued to process feelings related to her relationships with colleagues at work. She reflected on use of inner child work to acknowledge her emotional brain but has noticed progress in her ability to utilize her logical brain.   Pt acknowledged her social anxiety sxs have impacted her current position. 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 Worked with cln to challenge these thoughts.  Revisited pt's boundaries with her siblings and her role in caring for her relative. Pt shared she has been aware of feelings related to rejection and identified she no longer engages in avoidant behaviors and is making efforts to approach uncomfortable situations.   Explored familial dynamic specifically between the patient's husband and their children. Addressed ways in which she could support her family in establish healthier communication as well as spending quality time together. Established a plan for patient to introduce quality time.    Suicidal/Homicidal: Nowithout intent/plan  Therapist Response: Utilized active and supportive reflection to create an environment for patient to process recent life stressors and symptoms. Clinician assessed for current stressors, symptoms, safety since last session. Utilized CBT to reframe unhelpful thought patterns. Worked with pt to identity solutions to improving familial dynamics. Referenced inner child work which reflecting on patients growth.  Plan: Return again in 3 weeks.  Diagnosis:  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  Bipolar II disorder in full remission (HCC)   Collaboration of Care: AEB psychiatrist can access notes and cln. Will review psychiatrists' notes. Check in with the patient and will see LCSW per availability. Patient agreed with treatment recommendations.   Patient/Guardian was advised Release of Information must be obtained prior to any record release in order to collaborate their care with an outside provider. Patient/Guardian was advised if they have not already done so to contact the registration department to sign all necessary forms in order for [place] to release information regarding their care.   Consent: Patient/Guardian gives verbal consent for treatment and assignment of benefits for services provided during this visit. Patient/Guardian expressed understanding and agreed to proceed.   Dereck Leep, LCSW 04/13/2023

## 2023-04-27 ENCOUNTER — Ambulatory Visit: Payer: 59 | Admitting: Licensed Clinical Social Worker

## 2023-05-04 ENCOUNTER — Ambulatory Visit: Payer: 59 | Admitting: Licensed Clinical Social Worker

## 2023-05-04 DIAGNOSIS — F5105 Insomnia due to other mental disorder: Secondary | ICD-10-CM | POA: Diagnosis not present

## 2023-05-04 DIAGNOSIS — F429 Obsessive-compulsive disorder, unspecified: Secondary | ICD-10-CM | POA: Diagnosis not present

## 2023-05-04 DIAGNOSIS — F3181 Bipolar II disorder: Secondary | ICD-10-CM

## 2023-05-04 NOTE — Progress Notes (Signed)
THERAPIST PROGRESS NOTE  Virtual Visit via Video Note  I connected with Kathryn Munoz on 05/04/23 at  9:00 AM EST by a video enabled telemedicine application and verified that I am speaking with the correct person using two identifiers.  Location: Patient: Address on file  Provider: Providers home    I discussed the limitations of evaluation and management by telemedicine and the availability of in person appointments. The patient expressed understanding and agreed to proceed.   I discussed the assessment and treatment plan with the patient. The patient was provided an opportunity to ask questions and all were answered. The patient agreed with the plan and demonstrated an understanding of the instructions.   The patient was advised to call back or seek an in-person evaluation if the symptoms worsen or if the condition fails to improve as anticipated.  I provided 40 minutes of non-face-to-face time during this encounter.   Dereck Leep, LCSW   Session Time: 9-9:40 am  Participation Level: Active  Behavioral Response: CasualAlertEuthymic  Type of Therapy: Individual Therapy  Treatment Goals addressed:  Goal: LTG: Rachana will stabilize mood and increase goal-directed behavior as measured by self report                          Goal: STG: Kilee will identify cognitive patterns and beliefs that interfere with therapy     Goal: LTG: Reduce frequency, intensity, and duration of depression symptoms so that daily functioning is improved                            Goal: STG: Jaynee will identify cognitive patterns and beliefs that support depression      Goal: LTG: Makeisha will recognize socially inappropriate behaviors and develop alternative behaviors                            Goal: STG: Ossie will identify 2 behaviors that engage in social isolation       ProgressTowards Goals: Progressing  Interventions: Strength-based and  Supportive  Summary: Kathryn Munoz is a 48 y.o. female who presents with symptoms of anxiety and depression. Patient identifies symptoms to include uncontrollable worry, negative self affect, and anxious feelings.Pt was oriented times 5. Pt was cooperative and engaged. Pt denies SI/HI/AVH.   Utilized therapeutic space to review patients progress by evaluating her current treatment plan. Pt reports her physical and mental health has overall improved. Feels she has found her voice and is finding ways to advocate for herself both in her personal and work life. Addressed improvement with misplaced guilt regarding maintaining the role of caregiver for her brother. Reflected on boundaries and acknowledged her roles in others lives. Based on progress pt agreed to spreading out her session stating she would like to move frequency to every 4 weeks.     See TP for notes on progress.   Suicidal/Homicidal: Nowithout intent/plan  Therapist Response: Utilized active and supportive reflection to create an environment for patient to process recent life stressors and symptoms. Clinician assessed for current stressors, symptoms, safety since last session. Processed patients progress and updated TP.   Plan: Return again in 2 weeks.  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  Will review psychiatrists' notes. Check in with the patient and will see LCSW per availability. Patient agreed with treatment recommendations.   Patient/Guardian was advised Release of Information must be obtained prior to any record release in order to collaborate their care with an outside provider. Patient/Guardian was advised if they have not already done so to contact the registration department to sign all necessary forms in order for [place] to release information  regarding their care.   Consent: Patient/Guardian gives verbal consent for treatment and assignment of benefits for services provided during this visit. Patient/Guardian expressed understanding and agreed to proceed.   Dereck Leep, LCSW 05/04/2023

## 2023-05-04 NOTE — Addendum Note (Signed)
Addended by: Renee Ramus B on: 05/04/2023 09:48 AM   Modules accepted: Level of Service

## 2023-05-18 ENCOUNTER — Ambulatory Visit: Payer: Self-pay | Admitting: Licensed Clinical Social Worker

## 2023-05-21 ENCOUNTER — Ambulatory Visit: Admitting: Internal Medicine

## 2023-05-21 ENCOUNTER — Other Ambulatory Visit: Payer: Self-pay

## 2023-05-21 ENCOUNTER — Encounter: Payer: Self-pay | Admitting: Internal Medicine

## 2023-05-21 VITALS — BP 122/80 | HR 75 | Temp 98.2°F | Resp 16 | Ht 65.5 in | Wt 206.3 lb

## 2023-05-21 DIAGNOSIS — E039 Hypothyroidism, unspecified: Secondary | ICD-10-CM | POA: Diagnosis not present

## 2023-05-21 DIAGNOSIS — Z9884 Bariatric surgery status: Secondary | ICD-10-CM | POA: Diagnosis not present

## 2023-05-21 NOTE — Progress Notes (Signed)
 Acute Office Visit  Subjective:     Patient ID: Kathryn Munoz, female    DOB: 11-Apr-1975, 48 y.o.   MRN: [id number]  Chief Complaint  Patient presents with   Consult    Had bariactric surgery in December    HPI Patient is in today for feeling generally unwell lately.  Patient states she underwent a gastric bypass surgery on March 07, 2023 and has lost about 40 pounds since that time.  She states after her surgery she felt well but about 1 week ago had a fall that resulted in abrasions on her face.  She states she was walking from the grass to the sidewalk and missed the transition which caused her to fall.  She also fell again a few days ago while chasing her dog, at that time she feels like she twisted her ribs and is now having some mild left-sided rib pain.  Patient states she feels like she is not eating as much and would like some labs checked today.  She denies chest pain, shortness of breath, abdominal pain, nausea or vomiting or changes in bowel movements.   Review of Systems  All other systems reviewed and are negative.       Objective:    BP 122/80 (Cuff Size: Large)   Pulse 75   Temp 98.2 F (36.8 C) (Oral)   Resp 16   Ht 5' 5.5" (1.664 m)   Wt 206 lb 4.8 oz (93.6 kg)   SpO2 97%   BMI 33.81 kg/m  BP Readings from Last 3 Encounters:  05/21/23 122/80  11/28/22 116/74  07/18/22 114/72   Wt Readings from Last 3 Encounters:  05/21/23 206 lb 4.8 oz (93.6 kg)  11/28/22 245 lb (111.1 kg)  07/18/22 246 lb 9.6 oz (111.9 kg)      Physical Exam Constitutional:      Appearance: Normal appearance.  HENT:     Head: Normocephalic and atraumatic.     Mouth/Throat:     Mouth: Mucous membranes are moist.     Pharynx: Oropharynx is clear.  Eyes:     Extraocular Movements: Extraocular movements intact.     Conjunctiva/sclera: Conjunctivae normal.     Pupils: Pupils are equal, round, and reactive to light.  Neck:     Comments: No thyromegaly Cardiovascular:      Rate and Rhythm: Normal rate and regular rhythm.  Pulmonary:     Effort: Pulmonary effort is normal.     Breath sounds: Normal breath sounds.  Abdominal:     Tenderness: There is no abdominal tenderness.  Musculoskeletal:     Cervical back: No tenderness.  Lymphadenopathy:     Cervical: No cervical adenopathy.  Skin:    General: Skin is warm and dry.  Neurological:     General: No focal deficit present.     Mental Status: She is alert. Mental status is at baseline.     Cranial Nerves: No cranial nerve deficit.     Motor: No weakness.     Coordination: Coordination normal.     Gait: Gait normal.  Psychiatric:        Mood and Affect: Mood normal.        Behavior: Behavior normal.     No results found for any visits on 05/21/23.      Assessment & Plan:   1. History of gastric bypass (Primary): Patient has lost 40 pounds since her last office visit after gastric bypass surgery in December and has  been feeling generally unwell for the last week and had 2 falls.  Vital exams today normal full exam without abnormalities today, good strength and coordination and overall normal neuroexam.  Will check some labs today.  - CBC w/Diff/Platelet - Basic Metabolic Panel (BMET)  2. Acquired hypothyroidism: Will recheck her TSH as she may not require as high of the levothyroxine dose now that she has lost weight which could be contributing to her symptoms.  - TSH   Return in about 6 months (around 11/21/2023) for physical; after 9/11.  Margarita Mail, DO

## 2023-05-22 LAB — CBC WITH DIFFERENTIAL/PLATELET
Absolute Lymphocytes: 3242 {cells}/uL (ref 850–3900)
Absolute Monocytes: 300 {cells}/uL (ref 200–950)
Basophils Absolute: 31 {cells}/uL (ref 0–200)
Basophils Relative: 0.4 %
Eosinophils Absolute: 131 {cells}/uL (ref 15–500)
Eosinophils Relative: 1.7 %
HCT: 38.6 % (ref 35.0–45.0)
Hemoglobin: 12.5 g/dL (ref 11.7–15.5)
MCH: 31.2 pg (ref 27.0–33.0)
MCHC: 32.4 g/dL (ref 32.0–36.0)
MCV: 96.3 fL (ref 80.0–100.0)
MPV: 10.1 fL (ref 7.5–12.5)
Monocytes Relative: 3.9 %
Neutro Abs: 3996 {cells}/uL (ref 1500–7800)
Neutrophils Relative %: 51.9 %
Platelets: 294 10*3/uL (ref 140–400)
RBC: 4.01 10*6/uL (ref 3.80–5.10)
RDW: 10.7 % — ABNORMAL LOW (ref 11.0–15.0)
Total Lymphocyte: 42.1 %
WBC: 7.7 10*3/uL (ref 3.8–10.8)

## 2023-05-22 LAB — BASIC METABOLIC PANEL
BUN: 15 mg/dL (ref 7–25)
CO2: 29 mmol/L (ref 20–32)
Calcium: 9.7 mg/dL (ref 8.6–10.2)
Chloride: 101 mmol/L (ref 98–110)
Creat: 0.79 mg/dL (ref 0.50–0.99)
Glucose, Bld: 90 mg/dL (ref 65–99)
Potassium: 4.3 mmol/L (ref 3.5–5.3)
Sodium: 140 mmol/L (ref 135–146)

## 2023-05-22 LAB — TSH: TSH: 3.71 m[IU]/L

## 2023-05-27 ENCOUNTER — Other Ambulatory Visit: Payer: Self-pay | Admitting: Internal Medicine

## 2023-05-27 DIAGNOSIS — E039 Hypothyroidism, unspecified: Secondary | ICD-10-CM

## 2023-05-29 NOTE — Telephone Encounter (Signed)
 Requested Prescriptions  Pending Prescriptions Disp Refills   levothyroxine (SYNTHROID) 100 MCG tablet [Pharmacy Med Name: LEVOTHYROXINE 100 MCG TABLET] 90 tablet 1    Sig: TAKE 1 TABLET BY MOUTH DAILY. FOLLOW UP IN OFFICE IN 6-8 WEEKS FOR LABS/RECHECK FOR MORE REFILLS     Endocrinology:  Hypothyroid Agents Passed - 05/29/2023 10:12 AM      Passed - TSH in normal range and within 360 days    TSH  Date Value Ref Range Status  05/21/2023 3.71 mIU/L Final    Comment:              Reference Range .           > or = 20 Years  0.40-4.50 .                Pregnancy Ranges           First trimester    0.26-2.66           Second trimester   0.55-2.73           Third trimester    0.43-2.91          Passed - Valid encounter within last 12 months    Recent Outpatient Visits           6 months ago Annual physical exam   [facility] Danelle Berry, PA-C   1 year ago Acquired hypothyroidism   [facility] Margarita Mail, DO   1 year ago Benign paroxysmal positional vertigo due to bilateral vestibular disorder   [facility] Margarita Mail, DO   1 year ago Annual physical exam   [facility] Danelle Berry, PA-C   2 years ago Encounter for annual physical exam   [facility] Berniece Salines, FNP       Future Appointments             In 6 months Margarita Mail, DO [facility] [place]

## 2023-06-01 ENCOUNTER — Ambulatory Visit: Payer: 59 | Admitting: Licensed Clinical Social Worker

## 2023-06-01 DIAGNOSIS — F5105 Insomnia due to other mental disorder: Secondary | ICD-10-CM | POA: Diagnosis not present

## 2023-06-01 DIAGNOSIS — F429 Obsessive-compulsive disorder, unspecified: Secondary | ICD-10-CM

## 2023-06-01 DIAGNOSIS — F3181 Bipolar II disorder: Secondary | ICD-10-CM

## 2023-06-01 NOTE — Progress Notes (Signed)
 THERAPIST PROGRESS NOTE  Virtual Visit via Video Note  I connected with Kathryn Munoz on 06/01/23 at 11:00 AM EDT by a video enabled telemedicine application and verified that I am speaking with the correct person using two identifiers.  Location: Patient: Address on file Provider: Providers address   I discussed the limitations of evaluation and management by telemedicine and the availability of in person appointments. The patient expressed understanding and agreed to proceed.    I discussed the assessment and treatment plan with the patient. The patient was provided an opportunity to ask questions and all were answered. The patient agreed with the plan and demonstrated an understanding of the instructions.   The patient was advised to call back or seek an in-person evaluation if the symptoms worsen or if the condition fails to improve as anticipated.  I provided 49 minutes of non-face-to-face time during this encounter.   Dereck Leep, LCSW   Session Time: 11-11:49am  Participation Level: Active  Behavioral Response: CasualAlertEuthymic  Type of Therapy: Individual Therapy  Treatment Goals addressed:  Goal: LTG: Kylah will stabilize mood and increase goal-directed behavior as measured by self report                                     Goal: STG: Adalee will identify cognitive patterns and beliefs that interfere with therapy             Goal: LTG: Reduce frequency, intensity, and duration of depression symptoms so that daily functioning is improved                                    Goal: STG: Traniyah will identify cognitive patterns and beliefs that support depression                Goal: LTG: Skylarr will recognize socially inappropriate behaviors and develop alternative behaviors                                         Goal: STG: Tine will identify 2 behaviors that engage in social isolation         ProgressTowards Goals:  Progressing  Interventions: CBT, Supportive, Reframing, and Other: Inner Child Work   Summary: Kathryn Munoz is a 48 y.o. female who presents with symptoms of anxiety and depression. Patient identifies symptoms to include uncontrollable worry, negative self affect, and anxious feelings.Pt was oriented times 5. Pt was cooperative and engaged. Pt denies SI/HI/AVH.    The patient reflected on her experience of shifting her perspective regarding work dynamics. She identified that she can control her reactions and emotions. She specified that by focusing on completing tasks, she has reduced her stress related to work. 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 The patient shared that her social anxiety has improved, and she feels ready to cope with her anxiety in this new role. She acknowledged that her social anxiety tends to worsen if she lacks confidence in the subject matter; however, if she is familiar with the topic she is discussing, she does not experience anxiety. The patient continued to explore ways to accept that she does not need the approval  of others, sharing mantras that have helped improve her confidence.  The patient expressed disappointment about not hearing from her brothers. Discussed how she is navigating her relationships with her siblings and focusing on what she can control. She reflected on childhood worries stemming from feelings of rejection by her family. Utilized inner child work to address her sympathy for others and her fear of disappointing people. The discussion also included her fear of repeating her parents' choices and her efforts to forgive them. The patient identified her motivation as a goal to break generational patterns. She reflected on a previous suicide attempt and her ongoing efforts to prioritize her mental health.  The patient mentioned that tomorrow marks the anniversary of  her mother's passing and identified ways she plans to honor her mother.   Additionally, she expressed a desire to continue discussing her husband's relationship with her son.  Suicidal/Homicidal: Nowithout intent/plan  Therapist Response: Utilized active and supportive reflection to create an environment for patient to process recent life stressors and symptoms. Clinician assessed for current stressors, symptoms, safety since last session.   Plan: Return again in 4 weeks.  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 Will review psychiatrists' notes. Check in with the patient and will see LCSW per availability. Patient agreed with treatment recommendations.   Patient/Guardian was advised Release of Information must be obtained prior to any record release in order to collaborate their care with an outside provider. Patient/Guardian was advised if they have not already done so to contact the registration department to sign all necessary forms in order for [place] to release information regarding their care.   Consent: Patient/Guardian gives verbal consent for treatment and assignment of benefits for services provided during this visit. Patient/Guardian expressed understanding and agreed to proceed.   Dereck Leep, LCSW 06/01/2023

## 2023-06-15 ENCOUNTER — Encounter: Payer: Self-pay | Admitting: Psychiatry

## 2023-06-15 ENCOUNTER — Telehealth: Payer: 59 | Admitting: Psychiatry

## 2023-06-15 DIAGNOSIS — F5105 Insomnia due to other mental disorder: Secondary | ICD-10-CM | POA: Diagnosis not present

## 2023-06-15 DIAGNOSIS — F3181 Bipolar II disorder: Secondary | ICD-10-CM

## 2023-06-15 DIAGNOSIS — F429 Obsessive-compulsive disorder, unspecified: Secondary | ICD-10-CM

## 2023-06-15 MED ORDER — QUETIAPINE FUMARATE 25 MG PO TABS: 25.0000 mg | ORAL_TABLET | Freq: Every day | ORAL | 0 refills | Status: DC

## 2023-06-15 NOTE — Progress Notes (Signed)
 Virtual Visit via Video Note  I connected with Kathryn Munoz on 06/15/23 at 10:30 AM EDT by a video enabled telemedicine application and verified that I am speaking with the correct person using two identifiers.  Location Provider Location : [facility] Patient Location : Work  Participants: Patient , Provider    I discussed the limitations of evaluation and management by telemedicine and the availability of in person appointments. The patient expressed understanding and agreed to proceed.    I discussed the assessment and treatment plan with the patient. The patient was provided an opportunity to ask questions and all were answered. The patient agreed with the plan and demonstrated an understanding of the instructions.   The patient was advised to call back or seek an in-person evaluation if the symptoms worsen or if the condition fails to improve as anticipated.   BH MD OP Progress Note  06/15/2023 2:10 PM [name]  MRN:  [id number]  Chief Complaint:  Chief Complaint  Patient presents with   Anxiety   Follow-up   Depression   Medication Refill   HPI: Kathryn Munoz is a 48 year old African-American female, employed, lives in [place], has a history of bipolar disorder, insomnia, hypothyroidism, OCD, gastric bypass surgery(02/2023) was evaluated by telemedicine today.  She continues to take Seroquel, Lamictal, and Celexa for her mental health conditions. She is on Celexa 20 mg, Lamictal 25 mg along with 150 mg, and Seroquel.  She denies any side effects to her current medication regimen.  She is actively engaged in therapy with Renee Ramus and has recently extended the interval between appointments to once a month, indicating progress in her mental health management.  Her sleep has improved since her last visit in January. She attributes this improvement to a combination of therapy, weight loss, and medication. She is not currently taking any over-the-counter medications for  sleep, such as melatonin, magnesium, which was discussed previously.  She has experienced significant weight loss, approximately 42-43 pounds since December, and reports feeling healthy. She is maintaining her weight and has no new medical concerns.  She is currently employed but is considering a job change due to a heavier workload than expected in her current position. She has been in this position since October 2023 and has attended a few job interviews recently.  Visit Diagnosis:    ICD-10-CM   1. Bipolar II disorder in full remission (HCC)  F31.81    Most recent episode depressed    2. 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  F42.9     3. Insomnia due to mental condition  F51.05 QUEtiapine (SEROQUEL) 25 MG tablet   mood      Past Psychiatric History: Reviewed past psychiatric history from my progress note on 11/08/2017.  Past trials of Celexa.  Suicide attempt at the age of 48 by overdose on pills.  Another attempt at the age of 48, slept through it and did not tell anyone.  Past Medical History:  Past Medical History:  Diagnosis Date   Abnormal mammogram    breast biopsy, [facility] 2014   ADD (attention deficit disorder)    Allergy 2017   Anxiety    Bipolar 2 disorder (HCC)    Constipation    Depression    Dermatitis    GERD (gastroesophageal reflux disease) 2017   Heavy menstrual bleeding    History of gestational diabetes    History of kidney stones 2014   Hypothyroidism    IFG (impaired fasting glucose)  Insulin resistance    Irregular periods    Joint pain    Multiple food allergies    Dates and nuts   Neuropathy    Obesity    OCD (obsessive compulsive disorder)    Prediabetes    Vitamin D deficiency disease     Past Surgical History:  Procedure Laterality Date   BREAST BIOPSY Left 2014   [place] bx/clip-neg   INTRAUTERINE DEVICE INSERTION  03/11/2012   TRIGGER FINGER RELEASE Right 04/21/2021    Family Psychiatric History: I have reviewed  family psychiatric history from progress note on 11/08/2017.  Family History:  Family History  Problem Relation Age of Onset   Diabetes Mother    Pancreatitis Mother    Hypertension Mother    Alcohol abuse Mother    Schizophrenia Mother    Depression Mother    Heart disease Mother    Stroke Mother    Thyroid disease Mother    Kidney disease Mother    Anxiety disorder Mother    Alcohol abuse Father    Diabetes Sister        pre-diabetic   Obesity Sister    Diabetes Brother    Drug abuse Brother    Stroke Brother    ADD / ADHD Son    Intellectual disability Son    Learning disabilities Son    Eczema Son    Breast cancer Cousin        pat cousin   Anxiety disorder Other    Ovarian cancer Neg Hx     Social History: I have reviewed social history from progress note on 11/08/2017. Social History   Socioeconomic History   Marital status: Married    Spouse name: Germaine Ripp   Number of children: 2   Years of education: Not on file   Highest education level: Bachelor's degree (e.g., BA, AB, BS)  Occupational History   Occupation: Charity fundraiser  Tobacco Use   Smoking status: Never   Smokeless tobacco: Never  Vaping Use   Vaping status: Never Used  Substance and Sexual Activity   Alcohol use: Yes    Comment: Ocassionally   Drug use: No   Sexual activity: Yes    Partners: Male    Birth control/protection: I.U.D., Other-see comments    Comment: Mirena  Other Topics Concern   Not on file  Social History Narrative   Not on file   Social Drivers of Health   Financial Resource Strain: Low Risk  (11/28/2022)   Overall Financial Resource Strain (CARDIA)    Difficulty of Paying Living Expenses: Not hard at all  Food Insecurity: No Food Insecurity (11/28/2022)   Hunger Vital Sign    Worried About Running Out of Food in the Last Year: Never true    Ran Out of Food in the Last Year: Never true  Transportation Needs: No Transportation Needs (11/28/2022)   PRAPARE -  Administrator, Civil Service (Medical): No    Lack of Transportation (Non-Medical): No  Physical Activity: Inactive (11/28/2022)   Exercise Vital Sign    Days of Exercise per Week: 0 days    Minutes of Exercise per Session: 0 min  Stress: Stress Concern Present (11/28/2022)   [employer] of Occupational Health - Occupational Stress Questionnaire    Feeling of Stress : Very much  Social Connections: Moderately Integrated (11/28/2022)   Social Connection and Isolation Panel [NHANES]    Frequency of Communication with Friends and Family: More than three times a  week    Frequency of Social Gatherings with Friends and Family: More than three times a week    Attends Religious Services: More than 4 times per year    Active Member of [employer] or Organizations: No    Attends Banker Meetings: Never    Marital Status: Married    Allergies:  Allergies  Allergen Reactions   Date Seed Extract  [Zizyphus Jujuba] Anaphylaxis, Cough, Itching and Swelling   Other Swelling    Metabolic Disorder Labs: Lab Results  Component Value Date   HGBA1C 5.7 (H) 11/28/2022   MPG 117 11/28/2022   MPG 126 04/13/2022   Lab Results  Component Value Date   PROLACTIN 10.9 11/08/2022   Lab Results  Component Value Date   CHOL 215 (H) 11/28/2022   TRIG 71 11/28/2022   HDL 46 (L) 11/28/2022   CHOLHDL 4.7 11/28/2022   VLDL 14 10/16/2016   LDLCALC 152 (H) 11/28/2022   LDLCALC 181 (H) 11/08/2022   Lab Results  Component Value Date   TSH 3.71 05/21/2023   TSH 2.69 11/28/2022    Therapeutic Level Labs: No results found for: "LITHIUM" No results found for: "VALPROATE" No results found for: "CBMZ"  Current Medications: Current Outpatient Medications  Medication Sig Dispense Refill   citalopram (CELEXA) 20 MG tablet Take 1 tablet (20 mg total) by mouth daily. 90 tablet 1   EPINEPHrine 0.3 mg/0.3 mL IJ SOAJ injection INJECT 0.3 MLS INTO THE MUSCLE ONCE FOR 1 DOSE. FOR  LIFE-THREATENING ALLERGIC REACTION / ANAPHYLAXIS  1   lamoTRIgine (LAMICTAL) 150 MG tablet TAKE 1 TABLET (150 MG TOTAL) BY MOUTH DAILY. TAKE ALONG WITH 25 MG DAILY 90 tablet 1   lamoTRIgine (LAMICTAL) 25 MG tablet Take 1 tablet (25 mg total) by mouth daily. Take along with 150 mg daily 90 tablet 1   levonorgestrel (MIRENA, 52 MG,) 20 MCG/DAY IUD Frequency:UNKNOWN   Dosage:0.0     Instructions:  Note:Dose: UNKNOWN (Patient not taking: Reported on 05/21/2023)     levothyroxine (SYNTHROID) 100 MCG tablet TAKE 1 TABLET BY MOUTH DAILY. FOLLOW UP IN OFFICE IN 6-8 WEEKS FOR LABS/RECHECK FOR MORE REFILLS 90 tablet 1   linaclotide (LINZESS) 145 MCG CAPS capsule TAKE 1 CAPSULE BY MOUTH EVERY DAY BEFORE BREAKFAST 90 capsule 3   omeprazole (PRILOSEC) 20 MG capsule Take by mouth.     Omeprazole Magnesium (PRILOSEC) 10 MG PACK      QUEtiapine (SEROQUEL) 25 MG tablet Take 1 tablet (25 mg total) by mouth at bedtime. 90 tablet 0   simethicone (GAS RELIEF INFANTS) 40 MG/0.6ML drops Take by mouth.     ursodiol (ACTIGALL) 300 MG capsule Take 300 mg by mouth 2 (two) times daily.     No current facility-administered medications for this visit.     Musculoskeletal: Strength & Muscle Tone:  UTA Gait & Station:  Seated Patient leans: N/A  Psychiatric Specialty Exam: Review of Systems  Psychiatric/Behavioral: Negative.      There were no vitals taken for this visit.There is no height or weight on file to calculate BMI.  General Appearance: Casual  Eye Contact:  Fair  Speech:  Clear and Coherent  Volume:  Normal  Mood:  Euthymic  Affect:  Congruent  Thought Process:  Goal Directed and Descriptions of Associations: Intact  Orientation:  Full (Time, Place, and Person)  Thought Content: Logical   Suicidal Thoughts:  No  Homicidal Thoughts:  No  Memory:  Immediate;   Fair Recent;  Fair Remote;   Fair  Judgement:  Fair  Insight:  Fair  Psychomotor Activity:  Normal  Concentration:  Concentration: Fair and  Attention Span: Fair  Recall:  [name] of Knowledge: Fair  Language: Fair  Akathisia:  No  Handed:  Right  AIMS (if indicated): not done  Assets:  Desire for Improvement Housing Resilience Social Support  ADL's:  Intact  Cognition: WNL  Sleep:  Fair   Screenings: AIMS    Flowsheet Row Office Visit from 11/08/2022 in [facility] Video Visit from 10/24/2021 in [facility] Video Visit from 07/27/2021 in [facility] Video Visit from 10/28/2020 in [facility]  AIMS Total Score 0 0 0 0      GAD-7    Flowsheet Row Counselor from 01/18/2023 in [facility] Office Visit from 11/28/2022 in [facility] Office Visit from 11/08/2022 in [facility] Office Visit from 04/13/2022 in [facility] Video Visit from 02/23/2022 in [facility]  Total GAD-7 Score 17 21 18  0 11      PHQ2-9    Flowsheet Row Office Visit from 05/21/2023 in [facility] Video Visit from 03/30/2023 in [facility] Counselor from 01/18/2023 in [facility] Video Visit from 12/29/2022 in [facility] Office Visit from 11/28/2022 in [facility]  PHQ-2 Total Score 0 0 2 0 2  PHQ-9 Total Score -- -- 14 -- 8      Flowsheet Row Video Visit from 06/15/2023 in [facility] Video Visit from 03/30/2023 in [facility] Counselor from 01/18/2023 in [facility]  C-SSRS RISK CATEGORY Moderate Risk Moderate Risk Error: Q7 should  not be populated when Q6 is No        Assessment and Plan: VOULA WALN is a 48 year old African-American female, married, employed, lives in [place], has a history of bipolar disorder, sleep problem, OCD, hypothyroidism was evaluated by telemedicine today.  Discussed assessment and plan as noted below.  Bipolar Disorder in remission Bipolar disorder is well-managed with the current medication regimen. She reports that the combination of therapy and medication is effective, with improved sleep quality. No new symptoms or concerns. - Continue Seroquel 25 mg at bedtime - Continue Lamictal 175 mg daily  Obsessive-Compulsive Disorder (OCD)-stable OCD is well-controlled with current treatment. She continues therapy with Weldon Inches and reports progress, with therapy sessions now extended to once a month. - Continue therapy sessions with Ms.[name] Julien Girt. - Continue Celexa 20 mg daily  Sleep Problems-improving Reports improvement in sleep quality, feeling rested upon waking. No use of over-the-counter medications for sleep. - Continue current management strategies for sleep.  Reviewed most recent labs including TSH-05/21/2023-within normal limits, CBC and BMP-within normal limits.  Follow-up - Follow-up in clinic in 3 to 4 months or sooner in person.   Collaboration of Care: Collaboration of Care: Referral or follow-up with counselor/therapist AEB patient encouraged to continue CBT  Patient/Guardian was advised Release of Information must be obtained prior to any record release in order to collaborate their care with an outside provider. Patient/Guardian was advised if they have not already done so to contact the registration department to sign all necessary forms  in order for [place] to release information regarding their care.   Consent: Patient/Guardian gives verbal consent for treatment and assignment of benefits for services provided during this visit. Patient/Guardian expressed  understanding and agreed to proceed.  This note was generated in part or whole with voice recognition software. Voice recognition is usually quite accurate but there are transcription errors that can and very often do occur. I apologize for any typographical errors that were not detected and corrected.   Discussed the use of a AI scribe software for clinical note transcription with the patient, who gave verbal consent to proceed.    Jomarie Longs, MD 06/15/2023, 2:10 PM

## 2023-07-08 ENCOUNTER — Encounter: Payer: Self-pay | Admitting: Internal Medicine

## 2023-07-13 ENCOUNTER — Ambulatory Visit: Admitting: Licensed Clinical Social Worker

## 2023-07-13 DIAGNOSIS — F3181 Bipolar II disorder: Secondary | ICD-10-CM | POA: Diagnosis not present

## 2023-07-13 DIAGNOSIS — F429 Obsessive-compulsive disorder, unspecified: Secondary | ICD-10-CM
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
## 2023-08-10 ENCOUNTER — Ambulatory Visit: Admitting: Licensed Clinical Social Worker

## 2023-09-20 ENCOUNTER — Other Ambulatory Visit: Payer: Self-pay

## 2023-09-20 ENCOUNTER — Ambulatory Visit (INDEPENDENT_AMBULATORY_CARE_PROVIDER_SITE_OTHER): Admitting: Psychiatry

## 2023-09-20 ENCOUNTER — Encounter: Payer: Self-pay | Admitting: Psychiatry

## 2023-09-20 VITALS — BP 127/84 | HR 76 | Temp 97.9°F | Ht 65.5 in | Wt 201.4 lb

## 2023-09-20 DIAGNOSIS — F5105 Insomnia due to other mental disorder: Secondary | ICD-10-CM | POA: Diagnosis not present

## 2023-09-20 DIAGNOSIS — F429 Obsessive-compulsive disorder, unspecified: Secondary | ICD-10-CM | POA: Diagnosis not present

## 2023-09-20 DIAGNOSIS — F3181 Bipolar II disorder: Secondary | ICD-10-CM

## 2023-09-20 MED ORDER — TRAZODONE HCL 50 MG PO TABS: 25.0000 mg | ORAL_TABLET | Freq: Every evening | ORAL | 0 refills | Status: DC | PRN

## 2023-09-20 MED ORDER — QUETIAPINE FUMARATE 25 MG PO TABS: 12.5000 mg | ORAL_TABLET | Freq: Every day | ORAL | Status: DC

## 2023-09-20 NOTE — Patient Instructions (Signed)
 Trazodone Tablets What is this medication? TRAZODONE (TRAZ oh done) treats depression. It increases the amount of serotonin in the brain, a hormone that helps regulate mood. This medicine may be used for other purposes; ask your health care provider or pharmacist if you have questions. COMMON BRAND NAME(S): Desyrel What should I tell my care team before I take this medication? They need to know if you have any of these conditions: Bipolar disorder Bleeding disorder Glaucoma Heart disease, or previous heart attack Irregular heartbeat or rhythm Kidney disease Liver disease Low levels of sodium in the blood Suicidal thoughts, plans, or attempt by you or a family member An unusual or allergic reaction to trazodone, other medications, foods, dyes, or preservatives Pregnant or trying to get pregnant Breastfeeding How should I use this medication? Take this medication by mouth with a glass of water. Take it as directed on the prescription label at the same time every day. Take this medication shortly after a meal or a light snack. Keep taking this medication unless your care team tells you to stop. Stopping it too quickly can cause serious side effects. It can also make your condition worse. A special MedGuide will be given to you by the pharmacist with each prescription and refill. Be sure to read this information carefully each time. Talk to your care team about the use of this medication in children. Special care may be needed. Overdosage: If you think you have taken too much of this medicine contact a poison control center or emergency room at once. NOTE: This medicine is only for you. Do not share this medicine with others. What if I miss a dose? If you miss a dose, take it as soon as you can. If it is almost time for your next dose, take only that dose. Do not take double or extra doses. What may interact with this medication? Do not take this medication with any of the following: Certain  medications for fungal infections, such as fluconazole, itraconazole, ketoconazole, posaconazole, voriconazole Cisapride Dronedarone Linezolid MAOIs, such as Carbex, Eldepryl, Marplan, Nardil, and Parnate Mesoridazine Methylene blue (injected into a vein) Pimozide Saquinavir Thioridazine This medication may also interact with the following: Alcohol Antiviral medications for HIV or AIDS Aspirin and aspirin-like medications Barbiturates, such as phenobarbital Certain medications for blood pressure, heart disease, irregular heart beat Certain medications for mental health conditions Certain medications for migraine headache, such as almotriptan, eletriptan, frovatriptan, naratriptan, rizatriptan, sumatriptan, zolmitriptan Certain medications for seizures, such as carbamazepine and phenytoin Certain medications for sleep Certain medications that treat or prevent blood clots, such as dalteparin, enoxaparin, warfarin Digoxin Fentanyl Lithium NSAIDS, medications for pain and inflammation, such as ibuprofen or naproxen Other medications that cause heart rhythm changes Rasagiline Supplements, such as St. John's wort, kava kava, valerian Tramadol Tryptophan This list may not describe all possible interactions. Give your health care provider a list of all the medicines, herbs, non-prescription drugs, or dietary supplements you use. Also tell them if you smoke, drink alcohol, or use illegal drugs. Some items may interact with your medicine. What should I watch for while using this medication? Visit your care team for regular checks on your progress. Tell your care team if your symptoms do not start to get better or if they get worse. Because it may take several weeks to see the full effects of this medication, it is important to continue your treatment as prescribed by your care team. Watch for new or worsening thoughts of suicide or depression. This  includes sudden changes in mood, behaviors,  or thoughts. These changes can happen at any time but are more common in the beginning of treatment or after a change in dose. Call your care team right away if you experience these thoughts or worsening depression. This medication may cause mood and behavior changes, such as anxiety, nervousness, irritability, hostility, restlessness, excitability, hyperactivity, or trouble sleeping. These changes can happen at any time but are more common in the beginning of treatment or after a change in dose. Call your care team right away if you notice any of these symptoms. This medication may affect your coordination, reaction time, or judgment. Do not drive or operate machinery until you know how this medication affects you. Sit up or stand slowly to reduce the risk of dizzy or fainting spells. Drinking alcohol with this medication can increase the risk of these side effects. This medication may cause dry eyes and blurred vision. If you wear contact lenses you may feel some discomfort. Lubricating drops may help. See your care team if the problem does not go away or is severe. Your mouth may get dry. Chewing sugarless gum or sucking hard candy and drinking plenty of water may help. Contact your care team if the problem does not go away or is severe. What side effects may I notice from receiving this medication? Side effects that you should report to your care team as soon as possible: Allergic reactions--skin rash, itching, hives, swelling of the face, lips, tongue, or throat Bleeding--bloody or black, tar-like stools, red or dark brown urine, vomiting blood or brown material that looks like coffee grounds, small, red or purple spots on skin, unusual bleeding or bruising Heart rhythm changes--fast or irregular heartbeat, dizziness, feeling faint or lightheaded, chest pain, trouble breathing Low blood pressure--dizziness, feeling faint or lightheaded, blurry vision Low sodium level--muscle weakness, fatigue,  dizziness, headache, confusion Prolonged or painful erection Serotonin syndrome--irritability, confusion, fast or irregular heartbeat, muscle stiffness, twitching muscles, sweating, high fever, seizures, chills, vomiting, diarrhea Sudden eye pain or change in vision such as blurry vision, seeing halos around lights, vision loss Thoughts of suicide or self-harm, worsening mood, feelings of depression Side effects that usually do not require medical attention (report to your care team if they continue or are bothersome): Change in sex drive or performance Constipation Dizziness Drowsiness Dry mouth This list may not describe all possible side effects. Call your doctor for medical advice about side effects. You may report side effects to FDA at 1-800-FDA-1088. Where should I keep my medication? Keep out of the reach of children and pets. Store at room temperature between 15 and 30 degrees C (59 to 86 degrees F). Protect from light. Keep container tightly closed. Throw away any unused medication after the expiration date. NOTE: This sheet is a summary. It may not cover all possible information. If you have questions about this medicine, talk to your doctor, pharmacist, or health care provider.  2024 Elsevier/Gold Standard (2022-03-02 00:00:00)

## 2023-09-20 NOTE — Progress Notes (Signed)
 [facility] MD OP Progress Note  09/20/2023 9:01 AM Kathryn Munoz  MRN:  [id number]  Chief Complaint:  Chief Complaint  Patient presents with   Follow-up   Depression   Anxiety   Medication Refill   Discussed the use of AI scribe software for clinical note transcription with the patient, who gave verbal consent to proceed.  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   She has experienced a significant weight loss of 40 to 45 pounds since her bariatric surgery in December, attributing the weight loss to the surgery .   She is currently taking Seroquel  (quetiapine ) at a low dose of 25 mg at night, which she has been on for the past five years. She switched from trazodone  to Seroquel  in 2020 when her anxiety and depression worsened. She also takes Lamictal  (lamotrigine ) at a dose of 150 to 175 mg at night. She has not experienced any side effects from Lamictal  recently, although she had heart palpitations when the dose was increased in the past.  She reports high stress levels at work but has been managing it better with counseling and coping strategies. She recently took a week of vacation, which was helpful. She is not currently seeing a therapist regularly.  No significant depression or anxiety symptoms, appetite issues, pain, muscle spasms, rigidity, tremor, or stiffness. Her sleep hygiene includes going to bed around 9:30 to 10:00 PM. She recently returned from a vacation and is working from home today.  She currently denies any suicidality, homicidality or perceptual disturbances.   Visit Diagnosis:    ICD-10-CM   1. Bipolar II disorder in full remission (HCC)  F31.81    Most recent episode depressed    2. 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  F42.9      3. Insomnia due to mental condition  F51.05 QUEtiapine  (SEROQUEL ) 25 MG tablet    traZODone  (DESYREL ) 50 MG tablet   mood      Past Psychiatric History: I have reviewed past psychiatric history from progress note on 11/08/2017.  Past trials of Celexa .  Suicide attempt at the age of 81 by overdose on pills.  Another attempt at the age of 12, slept through it and did not tell anyone.  Past Medical History:  Past Medical History:  Diagnosis Date   Abnormal mammogram    breast biopsy, [facility] 2014   ADD (attention deficit disorder)    Allergy 2017   Anxiety    Bipolar 2 disorder (HCC)    Constipation    Depression    Dermatitis    GERD (gastroesophageal reflux disease) 2017   Heavy menstrual bleeding    History of gestational diabetes    History of kidney stones 2014   Hypothyroidism    IFG (impaired fasting glucose)    Insulin  resistance    Irregular periods    Joint pain    Multiple food allergies    Dates and nuts   Neuropathy    Obesity    OCD (obsessive compulsive disorder)    Prediabetes    Vitamin D  deficiency disease     Past Surgical History:  Procedure Laterality Date   BREAST BIOPSY Left 2014   us  bx/clip-neg   INTRAUTERINE DEVICE INSERTION  03/11/2012   TRIGGER FINGER RELEASE Right 04/21/2021  Family Psychiatric History: I have reviewed family psychiatric history from progress on 11/08/2017.  Family History:  Family History  Problem Relation Age of Onset   Diabetes Mother    Pancreatitis Mother    Hypertension Mother    Alcohol abuse Mother    Schizophrenia Mother    Depression Mother    Heart disease Mother    Stroke Mother    Thyroid  disease Mother    Kidney disease Mother    Anxiety disorder Mother    Alcohol abuse Father    Diabetes Sister        pre-diabetic   Obesity Sister    Diabetes Brother    Drug abuse Brother    Stroke Brother    ADD / ADHD Son    Intellectual disability Son    Learning disabilities Son    Eczema Son     Breast cancer Cousin        pat cousin   Anxiety disorder Other    Ovarian cancer Neg Hx     Social History: I have reviewed social history from progress note on 11/08/2017. Social History   Socioeconomic History   Marital status: Married    Spouse name: Ryla Cauthon   Number of children: 2   Years of education: Not on file   Highest education level: Bachelor's degree (e.g., BA, AB, BS)  Occupational History   Occupation: Charity fundraiser  Tobacco Use   Smoking status: Never   Smokeless tobacco: Never  Vaping Use   Vaping status: Never Used  Substance and Sexual Activity   Alcohol use: Yes    Comment: Ocassionally   Drug use: No   Sexual activity: Yes    Partners: Male    Birth control/protection: I.U.D., Other-see comments    Comment: Mirena   Other Topics Concern   Not on file  Social History Narrative   Not on file   Social Drivers of Health   Financial Resource Strain: Low Risk  (11/28/2022)   Overall Financial Resource Strain (CARDIA)    Difficulty of Paying Living Expenses: Not hard at all  Food Insecurity: No Food Insecurity (11/28/2022)   Hunger Vital Sign    Worried About Running Out of Food in the Last Year: Never true    Ran Out of Food in the Last Year: Never true  Transportation Needs: No Transportation Needs (11/28/2022)   PRAPARE - Administrator, Civil Service (Medical): No    Lack of Transportation (Non-Medical): No  Physical Activity: Inactive (11/28/2022)   Exercise Vital Sign    Days of Exercise per Week: 0 days    Minutes of Exercise per Session: 0 min  Stress: Stress Concern Present (11/28/2022)   [employer] of Occupational Health - Occupational Stress Questionnaire    Feeling of Stress : Very much  Social Connections: Moderately Integrated (11/28/2022)   Social Connection and Isolation Panel    Frequency of Communication with Friends and Family: More than three times a week    Frequency of Social Gatherings with Friends and Family:  More than three times a week    Attends Religious Services: More than 4 times per year    Active Member of [employer] or Organizations: No    Attends Banker Meetings: Never    Marital Status: Married    Allergies:  Allergies  Allergen Reactions   Date Seed Extract  [Zizyphus Jujuba] Anaphylaxis, Cough, Itching and Swelling   Other Swelling    Metabolic Disorder Labs: Lab Results  Component Value Date   HGBA1C 5.7 (H) 11/28/2022   MPG 117 11/28/2022   MPG 126 04/13/2022   Lab Results  Component Value Date   PROLACTIN 10.9 11/08/2022   Lab Results  Component Value Date   CHOL 215 (H) 11/28/2022   TRIG 71 11/28/2022   HDL 46 (L) 11/28/2022   CHOLHDL 4.7 11/28/2022   VLDL 14 10/16/2016   LDLCALC 152 (H) 11/28/2022   LDLCALC 181 (H) 11/08/2022   Lab Results  Component Value Date   TSH 3.71 05/21/2023   TSH 2.69 11/28/2022    Therapeutic Level Labs: No results found for: LITHIUM No results found for: VALPROATE No results found for: CBMZ  Current Medications: Current Outpatient Medications  Medication Sig Dispense Refill   Biotin 5000 MCG CAPS Take by mouth daily.     Calcium-Vitamin D -Vitamin K (VIACTIV CALCIUM PLUS D) 650-12.5-40 MG-MCG-MCG CHEW Chew by mouth.     Multiple Vitamin (MULTIVITAMIN) tablet Take 1 tablet by mouth daily.     traZODone  (DESYREL ) 50 MG tablet Take 0.5-1 tablets (25-50 mg total) by mouth at bedtime as needed for sleep. 90 tablet 0   citalopram  (CELEXA ) 20 MG tablet Take 1 tablet (20 mg total) by mouth daily. 90 tablet 1   EPINEPHrine  0.3 mg/0.3 mL IJ SOAJ injection INJECT 0.3 MLS INTO THE MUSCLE ONCE FOR 1 DOSE. FOR LIFE-THREATENING ALLERGIC REACTION / ANAPHYLAXIS  1   lamoTRIgine  (LAMICTAL ) 150 MG tablet TAKE 1 TABLET (150 MG TOTAL) BY MOUTH DAILY. TAKE ALONG WITH 25 MG DAILY 90 tablet 1   lamoTRIgine  (LAMICTAL ) 25 MG tablet Take 1 tablet (25 mg total) by mouth daily. Take along with 150 mg daily 90 tablet 1   levonorgestrel   (MIRENA , 52 MG,) 20 MCG/DAY IUD Frequency:UNKNOWN   Dosage:0.0     Instructions:  Note:Dose: UNKNOWN (Patient not taking: Reported on 05/21/2023)     levothyroxine  (SYNTHROID ) 100 MCG tablet TAKE 1 TABLET BY MOUTH DAILY. FOLLOW UP IN OFFICE IN 6-8 WEEKS FOR LABS/RECHECK FOR MORE REFILLS 90 tablet 1   linaclotide  (LINZESS ) 145 MCG CAPS capsule TAKE 1 CAPSULE BY MOUTH EVERY DAY BEFORE BREAKFAST 90 capsule 3   Omeprazole  Magnesium (PRILOSEC) 10 MG PACK      QUEtiapine  (SEROQUEL ) 25 MG tablet Take 0.5 tablets (12.5 mg total) by mouth at bedtime for 14 days.     ursodiol (ACTIGALL) 300 MG capsule Take 300 mg by mouth 2 (two) times daily.     No current facility-administered medications for this visit.     Musculoskeletal: Strength & Muscle Tone: within normal limits Gait & Station: normal Patient leans: N/A  Psychiatric Specialty Exam: Review of Systems  Psychiatric/Behavioral: Negative.      Blood pressure 127/84, pulse 76, temperature 97.9 F (36.6 C), temperature source Temporal, height 5' 5.5 (1.664 m), weight 201 lb 6.4 oz (91.4 kg).Body mass index is 33 kg/m.  General Appearance: Casual  Eye Contact:  Fair  Speech:  Clear and Coherent  Volume:  Normal  Mood:  Euthymic  Affect:  Congruent  Thought Process:  Goal Directed and Descriptions of Associations: Intact  Orientation:  Full (Time, Place, and Person)  Thought Content: Logical   Suicidal Thoughts:  No  Homicidal Thoughts:  No  Memory:  Immediate;   Fair Recent;   Fair Remote;   Fair  Judgement:  Fair  Insight:  Fair  Psychomotor Activity:  Normal  Concentration:  Concentration: Fair and Attention Span: Fair  Recall:  [name] of Knowledge:  Fair  Language: Fair  Akathisia:  No  Handed:  Right  AIMS (if indicated): done  Assets:  Communication Skills Desire for Improvement Housing Intimacy Talents/Skills Transportation Vocational/Educational  ADL's:  Intact  Cognition: WNL  Sleep:  Fair    Screenings: Midwife Visit from 09/20/2023 in [facility] Office Visit from 11/08/2022 in [facility] Video Visit from 10/24/2021 in [facility] Video Visit from 07/27/2021 in [facility] Video Visit from 10/28/2020 in [facility]  AIMS Total Score 0 0 0 0 0   GAD-7    Flowsheet Row Office Visit from 09/20/2023 in [facility] Counselor from 01/18/2023 in [facility] Office Visit from 11/28/2022 in [facility] Office Visit from 11/08/2022 in [facility] Office Visit from 04/13/2022 in [facility]  Total GAD-7 Score 0 17 21 18  0   PHQ2-9    Flowsheet Row Office Visit from 09/20/2023 in [facility] Office Visit from 05/21/2023 in [facility] Video Visit from 03/30/2023 in [facility] Counselor from 01/18/2023 in [facility] Video Visit from 12/29/2022 in [facility]  PHQ-2 Total Score 0 0 0 2 0  PHQ-9 Total Score -- -- -- 14 --   Flowsheet Row Office Visit from 09/20/2023 in [facility] Video Visit from 06/15/2023 in [facility] Video Visit from 03/30/2023 in [facility]  C-SSRS RISK CATEGORY No Risk Moderate Risk Moderate Risk     Assessment and Plan: MAIRLYN TEGTMEYER is a 48 year old African-American female, married, employed, lives in [place], has a history of bipolar disorder, sleep problems, OCD, hypothyroidism  was evaluated in office today.  Discussed assessment and plan as noted below.  Bipolar disorder in remission Currently well managed on current medication regimen.  Agreeable to being tapered off of the Seroquel  due to long-term side effects concerns. Reduce Seroquel  to 12.5 mg at bedtime for a week and then use it as needed only. Continue Lamictal  175 mg daily  Obsessive-compulsive disorder-stable Currently well managed on current medication regimen.  No longer following up with therapist since she is currently doing well. Continue Celexa  20 mg daily Reestablish care with therapist Ms. Perkins as needed  Insomnia-stable Currently following her good sleep hygiene and denies any significant problems with sleep. Start Trazodone  25-50 mg at bedtime as needed since being tapered off of Seroquel . Continue sleep hygiene techniques.  Follow-up Follow-up in clinic in 2 months or sooner if needed.   Consent: Patient/Guardian gives verbal consent for treatment and assignment of benefits for services provided during this visit. Patient/Guardian expressed understanding and agreed to proceed.   This note was generated in part or whole with voice recognition software. Voice recognition is usually quite accurate but there are transcription errors that can and very often do occur. I apologize for any typographical errors that were not detected and corrected.    Freada Twersky, MD 09/20/2023, 2:33 PM

## 2023-11-12 ENCOUNTER — Encounter: Payer: Self-pay | Admitting: Internal Medicine

## 2023-11-12 ENCOUNTER — Ambulatory Visit: Admitting: Internal Medicine

## 2023-11-12 VITALS — BP 128/80 | HR 63 | Temp 97.9°F | Resp 16 | Ht 65.5 in | Wt 203.3 lb

## 2023-11-12 DIAGNOSIS — G43E11 Chronic migraine with aura, intractable, with status migrainosus: Secondary | ICD-10-CM

## 2023-11-12 DIAGNOSIS — H8113 Benign paroxysmal vertigo, bilateral: Secondary | ICD-10-CM | POA: Diagnosis not present

## 2023-11-12 MED ORDER — SUMATRIPTAN SUCCINATE 50 MG PO TABS: 50.0000 mg | ORAL_TABLET | ORAL | 1 refills | Status: DC | PRN

## 2023-11-12 MED ORDER — KETOROLAC TROMETHAMINE 60 MG/2ML IM SOLN
60.0000 mg | Freq: Once | INTRAMUSCULAR | Status: AC
  Administered 2023-11-12: 60 mg via INTRAMUSCULAR

## 2023-11-12 MED ORDER — MECLIZINE HCL 12.5 MG PO TABS: 12.5000 mg | ORAL_TABLET | Freq: Three times a day (TID) | ORAL | 0 refills | Status: AC | PRN

## 2023-11-12 MED ORDER — ONDANSETRON 4 MG PO TBDP: 4.0000 mg | ORAL_TABLET | Freq: Three times a day (TID) | ORAL | 0 refills | Status: AC | PRN

## 2023-11-12 NOTE — Patient Instructions (Signed)
 How to Perform the Epley Maneuver The Epley maneuver is an exercise that relieves symptoms of vertigo. Vertigo is the feeling that you or your surroundings are moving when they are not. When you feel vertigo, you may feel like the room is spinning and may have trouble walking. The Epley maneuver is used for a type of vertigo caused by a calcium deposit in a part of the inner ear. The maneuver involves changing head positions to help the deposit move out of the area. You can do this maneuver at home whenever you have symptoms of vertigo. You can repeat it in 24 hours if your vertigo has not gone away. Even though the Epley maneuver may relieve your vertigo for a few weeks, it is possible that your symptoms will return. This maneuver relieves vertigo, but it does not relieve dizziness. What are the risks? If it is done correctly, the Epley maneuver is considered safe. Sometimes it can lead to dizziness or nausea that goes away after a short time. If you develop other symptoms--such as changes in vision, weakness, or numbness--stop doing the maneuver and call your health care provider. Supplies needed: A bed or table. A pillow. How to do the Epley maneuver     Sit on the edge of a bed or table with your back straight and your legs extended or hanging over the edge of the bed or table. Turn your head halfway toward the affected ear or side as told by your health care provider. Lie backward quickly with your head turned until you are lying flat on your back. Your head should dangle (head-hanging position). You may want to position a pillow under your shoulders. Hold this position for at least 30 seconds. If you feel dizzy or have symptoms of vertigo, continue to hold the position until the symptoms stop. Turn your head to the opposite direction until your unaffected ear is facing down. Your head should continue to dangle. Hold this position for at least 30 seconds. If you feel dizzy or have symptoms of  vertigo, continue to hold the position until the symptoms stop. Turn your whole body to the same side as your head so that you are positioned on your side. Your head will now be nearly facedown and no longer needs to dangle. Hold for at least 30 seconds. If you feel dizzy or have symptoms of vertigo, continue to hold the position until the symptoms stop. Sit back up. You can repeat the maneuver in 24 hours if your vertigo does not go away. Follow these instructions at home: For 24 hours after doing the Epley maneuver: Keep your head in an upright position. When lying down to sleep or rest, keep your head raised (elevated) with two or more pillows. Avoid excessive neck movements. Activity Do not drive or use machinery if you feel dizzy. After doing the Epley maneuver, return to your normal activities as told by your health care provider. Ask your health care provider what activities are safe for you. General instructions Drink enough fluid to keep your urine pale yellow. Do not drink alcohol. Take over-the-counter and prescription medicines only as told by your health care provider. Keep all follow-up visits. This is important. Preventing vertigo symptoms Ask your health care provider if there is anything you should do at home to prevent vertigo. He or she may recommend that you: Keep your head elevated with two or more pillows while you sleep. Do not sleep on the side of your affected ear. Get  up slowly from bed. Avoid sudden movements during the day. Avoid extreme head positions or movement, such as looking up or bending over. Contact a health care provider if: Your vertigo gets worse. You have other symptoms, including: Nausea. Vomiting. Headache. Get help right away if you: Have vision changes. Have a headache or neck pain that is severe or getting worse. Cannot stop vomiting. Have new numbness or weakness in any part of your body. These symptoms may represent a serious problem  that is an emergency. Do not wait to see if the symptoms will go away. Get medical help right away. Call your local emergency services (911 in the [place] Do not drive yourself to the hospital. Summary Vertigo is the feeling that you or your surroundings are moving when they are not. The Epley maneuver is an exercise that relieves symptoms of vertigo. If the Epley maneuver is done correctly, it is considered safe. This information is not intended to replace advice given to you by your health care provider. Make sure you discuss any questions you have with your health care provider. Document Revised: 12/01/2022 Document Reviewed: 12/01/2022 Elsevier Patient Education  2024 [employer].

## 2023-11-12 NOTE — Progress Notes (Signed)
 Acute Office Visit  Subjective:     Patient ID: Kathryn Munoz, female    DOB: 1976/02/27, 48 y.o.   MRN: [id number]  Chief Complaint  Patient presents with   Migraine    For 5 days   Dizziness    For 3 days    HPI Patient is in today for headaches and vertigo.   Discussed the use of AI scribe software for clinical note transcription with the patient, who gave verbal consent to proceed.  History of Present Illness Kathryn Munoz is a 48 year old female who presents with dizziness and vertigo following a migraine episode.  She has experienced a migraine for five days, with grogginess and fatigue. Vertigo began on Saturday, characterized by a spinning sensation and nystagmus. Meclizine  provided relief. Dizziness worsens with head movement and is accompanied by nausea, for which she uses Zofran . This is her fourth episode since January, often starting with photophobia and phonophobia. She associates these episodes with migraines or vertigo. She does not take regular migraine medication. Symptoms have increased in frequency and severity, potentially linked to stress. She uses meclizine  and Zofran  as needed. She has undergone bariatric surgery and cannot take anti-inflammatory drugs. Her mother has a history of vertigo.    Review of Systems  Constitutional:  Negative for chills and fever.  Neurological:  Positive for dizziness and headaches. Negative for sensory change and weakness.        Objective:    BP 128/80 (Cuff Size: Large)   Pulse 63   Temp 97.9 F (36.6 C) (Oral)   Wt 203 lb 4.8 oz (92.2 kg)   BMI 33.32 kg/m  BP Readings from Last 3 Encounters:  11/12/23 128/80  05/21/23 122/80  11/28/22 116/74   Wt Readings from Last 3 Encounters:  11/12/23 203 lb 4.8 oz (92.2 kg)  05/21/23 206 lb 4.8 oz (93.6 kg)  11/28/22 245 lb (111.1 kg)      Physical Exam Constitutional:      Appearance: Normal appearance.  HENT:     Head: Normocephalic and atraumatic.      Right Ear: Tympanic membrane, ear canal and external ear normal.     Left Ear: Tympanic membrane, ear canal and external ear normal.     Nose: Nose normal.     Mouth/Throat:     Mouth: Mucous membranes are moist.     Pharynx: Oropharynx is clear.  Eyes:     Extraocular Movements: Extraocular movements intact.     Conjunctiva/sclera: Conjunctivae normal.     Pupils: Pupils are equal, round, and reactive to light.  Cardiovascular:     Rate and Rhythm: Normal rate and regular rhythm.  Pulmonary:     Effort: Pulmonary effort is normal.     Breath sounds: Normal breath sounds.  Musculoskeletal:     Cervical back: No tenderness.  Lymphadenopathy:     Cervical: No cervical adenopathy.  Skin:    General: Skin is warm and dry.  Neurological:     General: No focal deficit present.     Mental Status: She is alert. Mental status is at baseline.     Cranial Nerves: No cranial nerve deficit.     Sensory: No sensory deficit.     Motor: No weakness.     Gait: Gait normal.  Psychiatric:        Mood and Affect: Mood normal.        Behavior: Behavior normal.     No results found for  any visits on 11/12/23.      Assessment & Plan:    Assessment & Plan Migraine with associated vertigo and nausea Migraine with vertigo and nausea for five days. Vertigo likely due to BPPV. Differential includes post-migraine syndrome and BPPV. Discussed migraine and BPPV mechanisms. Meclizine  effective for dizziness but not curative. - Administered Toradol  injection for headache relief. - Prescribed Imitrex  50 mg for abortive therapy, max 200 mg/24 hours. - Refilled meclizine  for dizziness as needed. - Refilled Zofran  for nausea as needed. - Instructed on head exercises for BPPV management, once daily initially. - Performed neurological exam to rule out other causes. - Scheduled follow-up in one month to assess treatment efficacy and migraine frequency.  - meclizine  (ANTIVERT ) 12.5 MG tablet; Take 1  tablet (12.5 mg total) by mouth 3 (three) times daily as needed for dizziness.  Dispense: 30 tablet; Refill: 0 - ondansetron  (ZOFRAN -ODT) 4 MG disintegrating tablet; Take 1 tablet (4 mg total) by mouth every 8 (eight) hours as needed for nausea or vomiting.  Dispense: 20 tablet; Refill: 0 - SUMAtriptan  (IMITREX ) 50 MG tablet; Take 1 tablet (50 mg total) by mouth every 2 (two) hours as needed for migraine. May repeat in 2 hours if headache persists or recurs. Do not exceed 200 mg in 24 hour period.  Dispense: 10 tablet; Refill: 1 - ketorolac  (TORADOL ) injection 60 mg   Return in about 4 weeks (around 12/10/2023).  Sharyle Fischer, DO

## 2023-11-14 ENCOUNTER — Ambulatory Visit (INDEPENDENT_AMBULATORY_CARE_PROVIDER_SITE_OTHER): Admitting: Licensed Clinical Social Worker

## 2023-11-14 DIAGNOSIS — F3181 Bipolar II disorder: Secondary | ICD-10-CM | POA: Diagnosis not present

## 2023-11-14 DIAGNOSIS — F429 Obsessive-compulsive disorder, unspecified: Secondary | ICD-10-CM | POA: Diagnosis not present

## 2023-11-14 NOTE — Progress Notes (Signed)
 THERAPIST PROGRESS NOTE  Virtual Visit via Video Note  I connected with Murrel GORMAN Moats on 11/14/23 at  9:00 AM EDT by a video enabled telemedicine application and verified that I am speaking with the correct person using two identifiers.  Location: Patient: Address on file  Provider: [facility]   I discussed the limitations of evaluation and management by telemedicine and the availability of in person appointments. The patient expressed understanding and agreed to proceed.    I discussed the assessment and treatment plan with the patient. The patient was provided an opportunity to ask questions and all were answered. The patient agreed with the plan and demonstrated an understanding of the instructions.   The patient was advised to call back or seek an in-person evaluation if the symptoms worsen or if the condition fails to improve as anticipated.  I provided 44 minutes of non-face-to-face time during this encounter.   [name], LCSW   Session Time: 9:04am-9:49am  Participation Level: Active  Behavioral Response: CasualAlertEuthymic  Type of Therapy: Individual Therapy  Treatment Goals addressed:  Active     BH CCP BIPOLAR DISORDER-MANIA/HYPOMANIA     LTG: Tiney will stabilize mood and increase goal-directed behavior as measured by self report  (Progressing)     Start:  01/25/23    Expected End:  02/14/24      05/04/2023: 87% progress. Pt reports she feels like having someone to talk to whom she trusts and who can assist in challenge perspectives has helped her know that she can stand up for herself and handle push back. Pt states, I'm not as afraid of certain situations. Mood is up.       STG: Panda will identify cognitive patterns and beliefs that interfere with therapy (Progressing)     Start:  01/25/23    Expected End:  02/14/24      05/04/2023: 87% progress. Pt states, I have still had some work to do but reaching goals as far of therapy I feel I'm  hitting most pf the points that were my focus for therapy.       Work with Murrel to track symptoms, triggers, and/or skill use through a mood chart, diary card, or journal     Start:  01/25/23         Work with Murrel to develop at least a list of 2 triggers for a manic episode     Start:  01/25/23         Work with Murrel to develop a crisis plan which includes a list of a minimum of 2 triggers for emotional dysregulation with corresponding skills to cope ahead with each trigger     Start:  01/25/23           [name]     LTG: Reduce frequency, intensity, and duration of depression symptoms so that daily functioning is improved (Progressing)     Start:  01/25/23    Expected End:  02/14/24      05/04/2023: Definitely managing them a lot better. There has been something that have happened where I would not have been able to process them at all.SABRASABRAI've been able to mature and process and understand more how and why I was reacting to the situation. I definitely feel I'm accomplishing that part of what was causing me depression at the moment. Overall, I really have a good outlook.        STG: Juelle will identify cognitive patterns and beliefs that support depression (  Progressing)     Start:  01/25/23    Expected End:  02/14/24      05/04/2023: Reports thought inner child work she has learned to use her voice and assess whom she can trust. Reports improved confidence which assist in challenging negative cognitions.       Work with Murrel to track symptoms, triggers, and/or skill use through a mood chart, diary card, or journal     Start:  01/25/23         Work with Murrel to identify the major components of a recent episode of depression: physical symptoms, major thoughts and images, and major behaviors they experienced     Start:  01/25/23         Taysha will identify 2 trauma related cognitive distortions     Start:  01/25/23         Cindel will identify 3  cognitive distortions they are currently using and write reframing statements to replace them     Start:  01/25/23           Social Interpersonal Effectiveness     LTG: Uma will recognize socially inappropriate behaviors and develop alternative behaviors (Progressing)     Start:  01/25/23    Expected End:  02/14/24         STG: Davisha will identify 2 behaviors that engage in social isolation  (Progressing)     Start:  01/25/23    Expected End:  02/14/24      05/04/2023: 90% progress. Notes great improvement. Reports she has had moments where she wanted to cancel but has challenged herself to put herself into uncomfortable situations stating, now I know how to cope and get myself through them.       Observe patient's social engagement     Start:  01/25/23         Educate Viviene on appropriate behaviors and boundaries     Start:  01/25/23         Work with Murrel to identify 2 signs that they are engaging in social isolation     Start:  01/25/23            ProgressTowards Goals: Progressing  Interventions: Solution Focused, Supportive, and Other: Mindfulness  Summary: ALEGANDRA SOMMERS is a 48 y.o. female who presents with symptoms of anxiety and depression. Patient identifies symptoms to include uncontrollable worry, negative self affect, and anxious feelings.Pt was oriented times 5. Pt was cooperative and engaged. Pt denies SI/HI/AVH.   The patient reported an ongoing struggle with a depressive episode that has lasted for two weeks. She expressed that she has hit rock bottom, feeling consumed by hopeless thoughts. While she denies having a specific plan or intent to harm herself, she mentioned that her last concerning thought occurred on November 11, 2023. Additionally, she shared that her physical health has been deteriorating, and she felt particularly triggered last week when experiencing a migraine.  She opened up about various stressors related to her job,  conveying a sense of being overwhelmed by the pressures of making numerous decisions. Sleep disturbances have compounded her struggles, as she reported significant difficulties falling and staying asleep. The patient articulated feelings of uncontrollable worry about her life circumstances and their outcomes, which have been paralyzing.  In an effort to cope, she reached out to her husband and was candid about her emotional state, reflecting on the factors she can control. However, she has experienced increased agitation over the past two weeks.  An assessment of her sleep hygiene revealed that she has been dealing with insomnia, which has led her to resort to sugary foods as a means of coping, neglecting her prescribed medications intended to aid her sleep.  During the assessment, we explored whether her basic needs were being met, and she communicated a desire to increase her physical activity as part of her self-care strategy. Nonetheless, she continues to grapple with feelings of being overwhelmed by her mental health challenges and has yet to fully accept her situation. She expressed a sense of hopelessness regarding her ability to break the cycle of depression.  The patient noted that negative self-talk has been a persistent issue, although she is making efforts to reframe her thoughts by reading affirmations. To manage her stress, she has been taking breaks from her desk throughout the day. Despite these efforts, she has isolated herself from friends for the past month, believing she could mask her feelings. As a result, she has not been socializing as she normally would due to feeling incapable and overwhelmed.    Suicidal/Homicidal: Nowithout intent/plan  Therapist Response: Utilized active and supportive reflection to create an environment for patient to process recent life stressors and symptoms. Clinician assessed for current stressors, symptoms, safety since last session. Reflected on  progress and healthy communication. Continued to explore patients feelings about terminating services.  Clinician assessed for safety and reviewed mindfulness techniques in an effort to help patient identify efforts she is already taken to problem solve to improve her mental health.  Updated patient's treatment plan based on current presentation and in order to resume services.  Challenged patient to construct a plan for the weekend to engage in healthy coping techniques.  Plan: Return again in 2 weeks.  Diagnosis: Bipolar II disorder in full remission (HCC)  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   Collaboration of Care: [facility] psychiatrist can access notes and cln. Will review psychiatrists' notes. Check in with the patient and will see LCSW per availability. Patient agreed with treatment recommendations.   Patient/Guardian was advised Release of Information must be obtained prior to any record release in order to collaborate their care with an outside provider. Patient/Guardian was advised if they have not already done so to contact the registration department to sign all necessary forms in order for us  to release information regarding their care.   Consent: Patient/Guardian gives verbal consent for treatment and assignment of benefits for services provided during this visit. Patient/Guardian expressed understanding and agreed to proceed.   [name], LCSW 11/14/2023

## 2023-11-20 ENCOUNTER — Ambulatory Visit: Admitting: Licensed Clinical Social Worker

## 2023-11-23 ENCOUNTER — Telehealth: Admitting: Psychiatry

## 2023-11-25 ENCOUNTER — Other Ambulatory Visit: Payer: Self-pay | Admitting: Psychiatry

## 2023-11-25 DIAGNOSIS — F3181 Bipolar II disorder: Secondary | ICD-10-CM

## 2023-11-25 DIAGNOSIS — F429 Obsessive-compulsive disorder, unspecified: Secondary | ICD-10-CM

## 2023-11-26 ENCOUNTER — Ambulatory Visit (INDEPENDENT_AMBULATORY_CARE_PROVIDER_SITE_OTHER): Admitting: Licensed Clinical Social Worker

## 2023-11-26 DIAGNOSIS — F429 Obsessive-compulsive disorder, unspecified: Secondary | ICD-10-CM | POA: Diagnosis not present

## 2023-11-26 DIAGNOSIS — F3181 Bipolar II disorder: Secondary | ICD-10-CM

## 2023-11-26 NOTE — Progress Notes (Signed)
 THERAPIST PROGRESS NOTE  Virtual Visit via Video Note  I connected with Kathryn Munoz on 11/26/23 at  4:00 PM EDT by a video enabled telemedicine application and verified that I am speaking with the correct person using two identifiers.  Location: Patient: Address on file  Provider: Providers address   I discussed the limitations of evaluation and management by telemedicine and the availability of in person appointments. The patient expressed understanding and agreed to proceed.   I discussed the assessment and treatment plan with the patient. The patient was provided an opportunity to ask questions and all were answered. The patient agreed with the plan and demonstrated an understanding of the instructions.   The patient was advised to call back or seek an in-person evaluation if the symptoms worsen or if the condition fails to improve as anticipated.  I provided 60 minutes of non-face-to-face time during this encounter.   Kathryn Munoz KATHEE Husband, LCSW   Session Time: 4-5pm  Participation Level: Active  Behavioral Response: CasualAlertEuthymic  Type of Therapy: Individual Therapy  Treatment Goals addressed:  Active     BH CCP BIPOLAR DISORDER-MANIA/HYPOMANIA     LTG: Kathryn Munoz will stabilize mood and increase goal-directed behavior as measured by self report  (Progressing)     Start:  01/25/23    Expected End:  02/14/24      05/04/2023: 87% progress. Pt reports she feels like having someone to talk to whom she trusts and who can assist in challenge perspectives has helped her know that she can stand up for herself and handle push back. Pt states, I'm not as afraid of certain situations. Mood is up.       STG: Kathryn Munoz will identify cognitive patterns and beliefs that interfere with therapy (Progressing)     Start:  01/25/23    Expected End:  02/14/24      05/04/2023: 87% progress. Pt states, I have still had some work to do but reaching goals as far of therapy I feel I'm  hitting most pf the points that were my focus for therapy.       Work with Kathryn to track symptoms, triggers, and/or skill use through a mood chart, diary card, or journal     Start:  01/25/23         Work with Kathryn to develop at least a list of 2 triggers for a manic episode     Start:  01/25/23         Work with Kathryn to develop a crisis plan which includes a list of a minimum of 2 triggers for emotional dysregulation with corresponding skills to cope ahead with each trigger     Start:  01/25/23           [name]     LTG: Reduce frequency, intensity, and duration of depression symptoms so that daily functioning is improved (Progressing)     Start:  01/25/23    Expected End:  02/14/24      05/04/2023: Definitely managing them a lot better. There has been something that have happened where I would not have been able to process them at all.SABRASABRAI've been able to mature and process and understand more how and why I was reacting to the situation. I definitely feel I'm accomplishing that part of what was causing me depression at the moment. Overall, I really have a good outlook.        STG: Kathryn Munoz will identify cognitive patterns and beliefs that support depression (  Progressing)     Start:  01/25/23    Expected End:  02/14/24      05/04/2023: Reports thought inner child work she has learned to use her voice and assess whom she can trust. Reports improved confidence which assist in challenging negative cognitions.       Work with Kathryn to track symptoms, triggers, and/or skill use through a mood chart, diary card, or journal     Start:  01/25/23         Work with Kathryn to identify the major components of a recent episode of depression: physical symptoms, major thoughts and images, and major behaviors they experienced     Start:  01/25/23         Kathryn Munoz will identify 2 trauma related cognitive distortions     Start:  01/25/23         Kathryn Munoz will identify 3  cognitive distortions they are currently using and write reframing statements to replace them     Start:  01/25/23           Social Interpersonal Effectiveness     LTG: Kathryn Munoz will recognize socially inappropriate behaviors and develop alternative behaviors (Progressing)     Start:  01/25/23    Expected End:  02/14/24         STG: Kathryn Munoz will identify 2 behaviors that engage in social isolation  (Progressing)     Start:  01/25/23    Expected End:  02/14/24      05/04/2023: 90% progress. Notes great improvement. Reports she has had moments where she wanted to cancel but has challenged herself to put herself into uncomfortable situations stating, now I know how to cope and get myself through them.       Observe patient's social engagement     Start:  01/25/23         Educate Kathryn Munoz on appropriate behaviors and boundaries     Start:  01/25/23         Work with Kathryn to identify 2 signs that they are engaging in social isolation     Start:  01/25/23           ProgressTowards Goals: Progressing  Interventions: CBT, Supportive, Reframing, and Other: Inner Child  Summary: Kathryn Munoz is a 48 y.o. female who presents with symptoms of anxiety and depression. Patient identifies symptoms to include uncontrollable worry, negative self affect, and anxious feelings.Pt was oriented times 5. Pt was cooperative and engaged. Pt denies SI/HI/AVH.  Patient reports her self-talk is showing notable improvement, yet she finds herself feeling uncomfortable and unsettled at her job.  While interpersonal relationships within her family have become more positive, the ongoing job plan has led her to feel increasingly trapped with her current position.  Patient identifies each day as a struggle reporting she feels she has to muster the energy to push herself out the door and into the workplace.  Patient reports barriers to reaching out to others to be fear of her being perceived as a burden.   During session, the patient explored her thoughts surrounding her current job position and expressed curiosity about the possibility of returning to school.  However, patient expresses desires overshadowed by feeling overwhelmed navigating the multitude of decisions before her.  She also expressed an overarching fear that her depression will hinder her ability to achieve her goals of completing her new chapter in school.  Patient also expressed grief as she anticipates becoming an Radio broadcast assistant.  Patient  became tearful reflecting on all the upcoming transitions while also identifying interactions with female authority figures as triggering to her ability to succeed and believing herself.  Clinician worked with patient to explore potential connections to her trauma history.  Identified ways in which patient has utilized her voice and overcome difficult situations in the past.  Reflected on solutions patient can begin to enforce as well as steps she can begin to take to inquire about new opportunities.  Suicidal/Homicidal: Nowithout intent/plan  Therapist Response: Cln utilized active and supportive reflection to create an environment for patient to process recent life stressors and symptoms. Clinician assessed for current stressors, symptoms, safety since last session.  Continue to work with patient on negative self-talk and reframing negative cognitions.  Explored ways in which patient is getting in her own way as far as perceiving capabilities and going back to school in exploring her career change.  Reflected on patient's strengths drawing up on inner child work.  Plan: Return again in 2 weeks.  Diagnosis: Bipolar II disorder in full remission (HCC)  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  Collaboration of Care: AEB psychiatrist can access notes and cln. Will review psychiatrists' notes. Check in with the patient and will see LCSW per availability. Patient agreed with treatment  recommendations.   Patient/Guardian was advised Release of Information must be obtained prior to any record release in order to collaborate their care with an outside provider. Patient/Guardian was advised if they have not already done so to contact the registration department to sign all necessary forms in order for us  to release information regarding their care.   Consent: Patient/Guardian gives verbal consent for treatment and assignment of benefits for services provided during this visit. Patient/Guardian expressed understanding and agreed to proceed.   Kathryn Munoz KATHEE Husband, LCSW 11/26/2023

## 2023-11-30 ENCOUNTER — Encounter: Payer: Self-pay | Admitting: Internal Medicine

## 2023-11-30 ENCOUNTER — Ambulatory Visit: Admitting: Internal Medicine

## 2023-11-30 ENCOUNTER — Other Ambulatory Visit: Payer: Self-pay

## 2023-11-30 VITALS — BP 124/72 | HR 79 | Temp 98.7°F | Resp 16 | Ht 65.5 in | Wt 196.4 lb

## 2023-11-30 DIAGNOSIS — Z23 Encounter for immunization: Secondary | ICD-10-CM

## 2023-11-30 DIAGNOSIS — Z0001 Encounter for general adult medical examination with abnormal findings: Secondary | ICD-10-CM | POA: Diagnosis not present

## 2023-11-30 DIAGNOSIS — E039 Hypothyroidism, unspecified: Secondary | ICD-10-CM | POA: Diagnosis not present

## 2023-11-30 DIAGNOSIS — Z13220 Encounter for screening for lipoid disorders: Secondary | ICD-10-CM

## 2023-11-30 DIAGNOSIS — Z0000 Encounter for general adult medical examination without abnormal findings: Secondary | ICD-10-CM

## 2023-11-30 NOTE — Progress Notes (Signed)
 Name: Kathryn Munoz   MRN: [id number]    DOB: 23-Jan-1976   Date:11/30/2023       Progress Note  Subjective  Chief Complaint  Chief Complaint  Patient presents with   Annual Exam    HPI  Patient presents for annual CPE.  Discussed the use of AI scribe software for clinical note transcription with the patient, who gave verbal consent to proceed.  History of Present Illness Kathryn Munoz is a 48 year old female who presents for an annual physical exam following bariatric surgery.  She underwent bariatric surgery in December 2024 and has been following up with regular lab tests. In July 2025, her hemoglobin and vitamin levels were normal, except for a slightly low vitamin D  level. She is taking a regular bariatric supplement but is unsure of its vitamin D  content. She has not eaten today in preparation for a cholesterol test, as her cholesterol was slightly high a year ago and has not been rechecked since. Her thyroid  function was last checked in March 2025 and is due for re-evaluation.  She experiences irregular menstrual periods, typically lasting three to five days. No issues with urinary incontinence are present. She had a normal mammogram in December 2024 and is due for another one soon. An endoscopy and colonoscopy were performed on November 13, 2023, with the next screening in ten years.  She does not smoke and has no concerns about sexually transmitted diseases. A Pap smear in August 2022 was normal. She has experienced migraines recently and plans to discuss this further in an upcoming appointment on December 14, 2023.   Diet: Regular Exercise: None  Last Eye Exam: completed Last Dental Exam: completed  Flowsheet Row Office Visit from 11/30/2023 in [facility]  AUDIT-C Score 0   Depression: Phq 9 is  negative    11/30/2023   10:21 AM 09/20/2023    8:52 AM 05/21/2023    3:28 PM 03/30/2023   10:14 AM 01/18/2023    8:04 AM  Depression screen PHQ 2/9   Decreased Interest 0  0    Down, Depressed, Hopeless 0  0    PHQ - 2 Score 0  0    Altered sleeping       Tired, decreased energy       Change in appetite       Feeling bad or failure about yourself        Trouble concentrating       Moving slowly or fidgety/restless       Suicidal thoughts       PHQ-9 Score       Difficult doing work/chores          Information is confidential and restricted. Go to Review Flowsheets to unlock data.   Hypertension: BP Readings from Last 3 Encounters:  11/30/23 124/72  11/12/23 128/80  05/21/23 122/80   Obesity: Wt Readings from Last 3 Encounters:  11/30/23 196 lb 6.4 oz (89.1 kg)  11/12/23 203 lb 4.8 oz (92.2 kg)  05/21/23 206 lb 4.8 oz (93.6 kg)   BMI Readings from Last 3 Encounters:  11/30/23 32.19 kg/m  11/12/23 33.32 kg/m  05/21/23 33.81 kg/m     Vaccines: reviewed with the patient. COVID rx given. Flu vaccine today.  Hep C Screening: completed STD testing and prevention (HIV/chl/gon/syphilis): no concerns  Intimate partner violence: negative screen  Sexual History :active Menstrual History/LMP/Abnormal Bleeding: irregular, last 3-5 days Discussed importance of follow up if any  post-menopausal bleeding: not applicable  Incontinence Symptoms: negative for symptoms   Breast cancer:  - Last Mammogram: 02/20/2023, Birads-1  Osteoporosis Prevention : Discussed high calcium and vitamin D  supplementation, weight bearing exercises Bone density :not applicable   Cervical cancer screening: will schedule with GYN, Pap normal 11/17/20, plan to repeat next year  Skin cancer: Discussed monitoring for atypical lesions  Colorectal cancer: [facility] colonoscopy 8/24, repeat in 10 years Lung cancer:  Low Dose CT Chest recommended if Age 45-80 years, 20 pack-year currently smoking OR have quit w/in 15years. Patient does not qualify for screen   ECG: 07/17/19  Advanced Care Planning: A voluntary discussion about advance care planning including the  explanation and discussion of advance directives.  Discussed health care proxy and Living will, and the patient was unable to identify a health care proxy.  Patient does not have a living will and power of attorney of health care   Patient Active Problem List   Diagnosis Date Noted   High risk medication use 11/08/2022   At risk for nausea 10/11/2021   Prolonged menstruation 04/12/2021   Vertigo    Drug-induced constipation 05/24/2020   Other hyperlipidemia 05/24/2020   Insulin  resistance 01/08/2020   Bipolar disorder, in full remission, most recent episode depressed (HCC) 12/23/2019   Heavy menstrual period 06/28/2019   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 01/01/2019   Bipolar II disorder in full remission (HCC) 09/05/2018   Insomnia due to mental condition 09/05/2018   Class 3 severe obesity with serious comorbidity and body mass index (BMI) of 40.0 to 44.9 in adult 05/09/2018   Metabolic syndrome 10/27/2017   Acanthosis nigricans 10/22/2017   Moderate episode of recurrent major depressive disorder (HCC) 10/22/2017   Acid reflux 04/01/2017   Anxiety 06/01/2016   Morbid obesity with BMI of 40.0-44.9, adult (HCC) 03/12/2015   Peripheral neuropathy 10/09/2014   Hypothyroidism    IFG (impaired fasting glucose)    Vitamin D  deficiency    Neuropathy     Past Surgical History:  Procedure Laterality Date   BREAST BIOPSY Left 2014   us  bx/clip-neg   INTRAUTERINE DEVICE INSERTION  03/11/2012   TRIGGER FINGER RELEASE Right 04/21/2021    Family History  Problem Relation Age of Onset   Diabetes Mother    Pancreatitis Mother    Hypertension Mother    Alcohol abuse Mother    Schizophrenia Mother    Depression Mother    Heart disease Mother    Stroke Mother    Thyroid  disease Mother    Kidney disease Mother    Anxiety disorder Mother    Alcohol abuse Father    Diabetes Sister        pre-diabetic   Obesity Sister    Diabetes Brother    Drug abuse Brother     Stroke Brother    ADD / ADHD Son    Intellectual disability Son    Learning disabilities Son    Eczema Son    Breast cancer Cousin        pat cousin   Anxiety disorder Other    Ovarian cancer Neg Hx     Social History   Socioeconomic History   Marital status: Married    Spouse name: Taydem Cavagnaro   Number of children: 2   Years of education: Not on file   Highest education level: Bachelor's degree (e.g., BA, AB, BS)  Occupational History   Occupation: Chemist  Tobacco Use   Smoking status: Never  Smokeless tobacco: Never  Vaping Use   Vaping status: Never Used  Substance and Sexual Activity   Alcohol use: Yes    Comment: Ocassionally   Drug use: No   Sexual activity: Yes    Partners: Male    Birth control/protection: I.U.D., Other-see comments    Comment: Mirena   Other Topics Concern   Not on file  Social History Narrative   Not on file   Social Drivers of Health   Financial Resource Strain: Low Risk  (11/30/2023)   Overall Financial Resource Strain (CARDIA)    Difficulty of Paying Living Expenses: Not hard at all  Food Insecurity: No Food Insecurity (11/30/2023)   Hunger Vital Sign    Worried About Running Out of Food in the Last Year: Never true    Ran Out of Food in the Last Year: Never true  Transportation Needs: No Transportation Needs (11/30/2023)   PRAPARE - Administrator, Civil Service (Medical): No    Lack of Transportation (Non-Medical): No  Physical Activity: Inactive (11/30/2023)   Exercise Vital Sign    Days of Exercise per Week: 0 days    Minutes of Exercise per Session: 0 min  Stress: Stress Concern Present (11/30/2023)   [employer] of Occupational Health - Occupational Stress Questionnaire    Feeling of Stress: Very much  Social Connections: Moderately Integrated (11/30/2023)   Social Connection and Isolation Panel    Frequency of Communication with Friends and Family: More than three times a week    Frequency of Social  Gatherings with Friends and Family: More than three times a week    Attends Religious Services: More than 4 times per year    Active Member of [employer] or Organizations: No    Attends Banker Meetings: Never    Marital Status: Married  Catering manager Violence: Not At Risk (11/30/2023)   Humiliation, Afraid, Rape, and Kick questionnaire    Fear of Current or Ex-Partner: No    Emotionally Abused: No    Physically Abused: No    Sexually Abused: No     Current Outpatient Medications:    Biotin 5000 MCG CAPS, Take by mouth daily., Disp: , Rfl:    Calcium-Vitamin D -Vitamin K (VIACTIV CALCIUM PLUS D) 650-12.5-40 MG-MCG-MCG CHEW, Chew by mouth., Disp: , Rfl:    citalopram  (CELEXA ) 20 MG tablet, Take 1 tablet (20 mg total) by mouth daily., Disp: 90 tablet, Rfl: 1   EPINEPHrine  0.3 mg/0.3 mL IJ SOAJ injection, INJECT 0.3 MLS INTO THE MUSCLE ONCE FOR 1 DOSE. FOR LIFE-THREATENING ALLERGIC REACTION / ANAPHYLAXIS, Disp: , Rfl: 1   lamoTRIgine  (LAMICTAL ) 150 MG tablet, TAKE 1 TABLET (150 MG TOTAL) BY MOUTH DAILY. TAKE ALONG WITH 25 MG DAILY, Disp: 90 tablet, Rfl: 1   lamoTRIgine  (LAMICTAL ) 25 MG tablet, TAKE 1 TABLET (25 MG TOTAL) BY MOUTH DAILY. TAKE ALONG WITH 150 MG DAILY, Disp: 90 tablet, Rfl: 1   levonorgestrel  (MIRENA , 52 MG,) 20 MCG/DAY IUD, Frequency:UNKNOWN   Dosage:0.0     Instructions:  Note:Dose: UNKNOWN, Disp: , Rfl:    levothyroxine  (SYNTHROID ) 100 MCG tablet, TAKE 1 TABLET BY MOUTH DAILY. FOLLOW UP IN OFFICE IN 6-8 WEEKS FOR LABS/RECHECK FOR MORE REFILLS, Disp: 90 tablet, Rfl: 1   linaclotide  (LINZESS ) 145 MCG CAPS capsule, TAKE 1 CAPSULE BY MOUTH EVERY DAY BEFORE BREAKFAST, Disp: 90 capsule, Rfl: 3   meclizine  (ANTIVERT ) 12.5 MG tablet, Take 1 tablet (12.5 mg total) by mouth 3 (three) times daily as needed  for dizziness., Disp: 30 tablet, Rfl: 0   metFORMIN  (GLUCOPHAGE ) 500 MG tablet, Take 500 mg by mouth., Disp: , Rfl:    Multiple Vitamin (MULTIVITAMIN) tablet, Take 1 tablet by  mouth daily., Disp: , Rfl:    ondansetron  (ZOFRAN -ODT) 4 MG disintegrating tablet, Take 1 tablet (4 mg total) by mouth every 8 (eight) hours as needed for nausea or vomiting., Disp: 20 tablet, Rfl: 0   QUEtiapine  (SEROQUEL ) 25 MG tablet, Take 0.5 tablets (12.5 mg total) by mouth at bedtime for 14 days., Disp: , Rfl:    SUMAtriptan  (IMITREX ) 50 MG tablet, Take 1 tablet (50 mg total) by mouth every 2 (two) hours as needed for migraine. May repeat in 2 hours if headache persists or recurs. Do not exceed 200 mg in 24 hour period., Disp: 10 tablet, Rfl: 1   traZODone  (DESYREL ) 50 MG tablet, Take 0.5-1 tablets (25-50 mg total) by mouth at bedtime as needed for sleep., Disp: 90 tablet, Rfl: 0   Omeprazole  Magnesium (PRILOSEC) 10 MG PACK, , Disp: , Rfl:    ursodiol (ACTIGALL) 300 MG capsule, Take 300 mg by mouth 2 (two) times daily. (Patient not taking: Reported on 11/30/2023), Disp: , Rfl:   Allergies  Allergen Reactions   Date Seed Extract  [Zizyphus Jujuba] Anaphylaxis, Cough, Itching and Swelling   Other Swelling     Review of Systems  All other systems reviewed and are negative.    Objective  Vitals:   11/30/23 1021  BP: 124/72  Pulse: 79  Resp: 16  Temp: 98.7 F (37.1 C)  TempSrc: Oral  SpO2: 99%  Weight: 196 lb 6.4 oz (89.1 kg)  Height: 5' 5.5 (1.664 m)    Body mass index is 32.19 kg/m.  Physical Exam Constitutional:      Appearance: Normal appearance.  HENT:     Head: Normocephalic and atraumatic.     Mouth/Throat:     Mouth: Mucous membranes are moist.     Pharynx: Oropharynx is clear.  Eyes:     Extraocular Movements: Extraocular movements intact.     Conjunctiva/sclera: Conjunctivae normal.     Pupils: Pupils are equal, round, and reactive to light.  Neck:     Comments: No thyromegaly Cardiovascular:     Rate and Rhythm: Normal rate and regular rhythm.  Pulmonary:     Effort: Pulmonary effort is normal.     Breath sounds: Normal breath sounds.   Musculoskeletal:     Cervical back: No tenderness.     Right lower leg: No edema.     Left lower leg: No edema.  Lymphadenopathy:     Cervical: No cervical adenopathy.  Skin:    General: Skin is warm and dry.  Neurological:     General: No focal deficit present.     Mental Status: She is alert. Mental status is at baseline.  Psychiatric:        Mood and Affect: Mood normal.        Behavior: Behavior normal.     Last CBC Lab Results  Component Value Date   WBC 7.7 05/21/2023   HGB 12.5 05/21/2023   HCT 38.6 05/21/2023   MCV 96.3 05/21/2023   MCH 31.2 05/21/2023   RDW 10.7 (L) 05/21/2023   PLT 294 05/21/2023   Last metabolic panel Lab Results  Component Value Date   GLUCOSE 90 05/21/2023   NA 140 05/21/2023   K 4.3 05/21/2023   CL 101 05/21/2023   CO2 29 05/21/2023  BUN 15 05/21/2023   CREATININE 0.79 05/21/2023   EGFR 103 11/28/2022   CALCIUM 9.7 05/21/2023   PROT 7.4 11/28/2022   ALBUMIN 4.4 10/11/2021   LABGLOB 2.9 10/11/2021   AGRATIO 1.5 10/11/2021   BILITOT 0.7 11/28/2022   ALKPHOS 61 10/11/2021   AST 13 11/28/2022   ALT 13 11/28/2022   Last lipids Lab Results  Component Value Date   CHOL 215 (H) 11/28/2022   HDL 46 (L) 11/28/2022   LDLCALC 152 (H) 11/28/2022   TRIG 71 11/28/2022   CHOLHDL 4.7 11/28/2022   Last hemoglobin A1c Lab Results  Component Value Date   HGBA1C 5.7 (H) 11/28/2022   Last thyroid  functions Lab Results  Component Value Date   TSH 3.71 05/21/2023   T3TOTAL 170 07/17/2019   T4TOTAL 11.5 04/13/2022   Last vitamin D  Lab Results  Component Value Date   VD25OH 48.4 10/11/2021   Last vitamin B12 and Folate Lab Results  Component Value Date   VITAMINB12 554 10/11/2021   FOLATE 9.1 07/17/2019      Assessment & Plan  Assessment & Plan Adult Wellness Visit Routine wellness visit with well-controlled blood pressure. Weight loss post-bariatric surgery. Vaccinations current. Mammogram due this year. Pap smear  negative in 2022. - Order cholesterol and TSH tests. - Schedule mammogram. - Plan for Pap smear in 2026.  Hyperlipidemia Levels slightly elevated a year ago, potential improvement post-surgery. - Order cholesterol test.  Vitamin D  Deficiency Vitamin D  levels slightly low, current multivitamin use uncertain for vitamin D  content.  - Lipid Profile - TSH - Flu vaccine trivalent PF, 6mos and older(Flulaval,Afluria,Fluarix,Fluzone)   -USPSTF grade A and B recommendations reviewed with patient; age-appropriate recommendations, preventive care, screening tests, etc discussed and encouraged; healthy living encouraged; see AVS for patient education given to patient -Discussed importance of 150 minutes of physical activity weekly, eat two servings of fish weekly, eat one serving of tree nuts ( cashews, pistachios, pecans, almonds.SABRA) every other day, eat 6 servings of fruit/vegetables daily and drink plenty of water and avoid sweet beverages.   -Reviewed Health Maintenance: Yes.

## 2023-12-01 LAB — LIPID PANEL
Cholesterol: 188 mg/dL (ref <200)
HDL: 53 mg/dL (ref >=50)
LDL Cholesterol (Calc): 119 mg/dL — ABNORMAL HIGH
Non-HDL Cholesterol (Calc): 135 mg/dL — ABNORMAL HIGH (ref <130)
Total CHOL/HDL Ratio: 3.5 (calc) (ref <5.0)
Triglycerides: 72 mg/dL (ref <150)

## 2023-12-01 LAB — TSH: TSH: 1.4 m[IU]/L

## 2023-12-03 ENCOUNTER — Ambulatory Visit: Payer: Self-pay | Admitting: Internal Medicine

## 2023-12-03 DIAGNOSIS — E039 Hypothyroidism, unspecified: Secondary | ICD-10-CM

## 2023-12-03 MED ORDER — LEVOTHYROXINE SODIUM 100 MCG PO TABS: 100.0000 ug | ORAL_TABLET | Freq: Every day | ORAL | 1 refills | Status: AC

## 2023-12-13 ENCOUNTER — Ambulatory Visit (INDEPENDENT_AMBULATORY_CARE_PROVIDER_SITE_OTHER): Admitting: Licensed Clinical Social Worker

## 2023-12-13 ENCOUNTER — Encounter: Payer: Self-pay | Admitting: Licensed Clinical Social Worker

## 2023-12-13 DIAGNOSIS — F3181 Bipolar II disorder: Secondary | ICD-10-CM

## 2023-12-13 DIAGNOSIS — F429 Obsessive-compulsive disorder, unspecified: Secondary | ICD-10-CM

## 2023-12-13 NOTE — Progress Notes (Signed)
 THERAPIST PROGRESS NOTE  Virtual Visit via Video Note  I connected with Kathryn Munoz on 12/13/23 at 11:00 AM EDT by a video enabled telemedicine application and verified that I am speaking with the correct person using two identifiers.  Location: Patient: Home address  Provider: [facility]   I discussed the limitations of evaluation and management by telemedicine and the availability of in person appointments. The patient expressed understanding and agreed to proceed.   I discussed the assessment and treatment plan with the patient. The patient was provided an opportunity to ask questions and all were answered. The patient agreed with the plan and demonstrated an understanding of the instructions.   The patient was advised to call back or seek an in-person evaluation if the symptoms worsen or if the condition fails to improve as anticipated.  I provided 57 minutes of non-face-to-face time during this encounter.   Kathryn KATHEE Husband, LCSW   Session Time: 10:59 am-11:56am  Participation Level: Active  Behavioral Response: CasualAlertEuthymic  Type of Therapy: Individual Therapy  Treatment Goals addressed:  Active     BH CCP BIPOLAR DISORDER-MANIA/HYPOMANIA     LTG: Kathryn Munoz will stabilize mood and increase goal-directed behavior as measured by self report  (Progressing)     Start:  01/25/23    Expected End:  02/14/24      05/04/2023: 87% progress. Pt reports she feels like having someone to talk to whom she trusts and who can assist in challenge perspectives has helped her know that she can stand up for herself and handle push back. Pt states, I'm not as afraid of certain situations. Mood is up.       STG: Kathryn Munoz will identify cognitive patterns and beliefs that interfere with therapy (Progressing)     Start:  01/25/23    Expected End:  02/14/24      05/04/2023: 87% progress. Pt states, I have still had some work to do but reaching goals as far of therapy I feel I'm  hitting most pf the points that were my focus for therapy.       Work with Kathryn to track symptoms, triggers, and/or skill use through a mood chart, diary card, or journal     Start:  01/25/23         Work with Kathryn to develop at least a list of 2 triggers for a manic episode     Start:  01/25/23         Work with Kathryn to develop a crisis plan which includes a list of a minimum of 2 triggers for emotional dysregulation with corresponding skills to cope ahead with each trigger     Start:  01/25/23           [name]     LTG: Reduce frequency, intensity, and duration of depression symptoms so that daily functioning is improved (Progressing)     Start:  01/25/23    Expected End:  02/14/24      05/04/2023: Definitely managing them a lot better. There has been something that have happened where I would not have been able to process them at all.SABRASABRAI've been able to mature and process and understand more how and why I was reacting to the situation. I definitely feel I'm accomplishing that part of what was causing me depression at the moment. Overall, I really have a good outlook.        STG: Kathryn Munoz will identify cognitive patterns and beliefs that support depression (Progressing)  Start:  01/25/23    Expected End:  02/14/24      05/04/2023: Reports thought inner child work she has learned to use her voice and assess whom she can trust. Reports improved confidence which assist in challenging negative cognitions.       Work with Kathryn to track symptoms, triggers, and/or skill use through a mood chart, diary card, or journal     Start:  01/25/23         Work with Kathryn to identify the major components of a recent episode of depression: physical symptoms, major thoughts and images, and major behaviors they experienced     Start:  01/25/23         Kathryn Munoz will identify 2 trauma related cognitive distortions     Start:  01/25/23         Kathryn Munoz will identify 3  cognitive distortions they are currently using and write reframing statements to replace them     Start:  01/25/23           Social Interpersonal Effectiveness     LTG: Kathryn Munoz will recognize socially inappropriate behaviors and develop alternative behaviors (Progressing)     Start:  01/25/23    Expected End:  02/14/24         STG: Kathryn Munoz will identify 2 behaviors that engage in social isolation  (Progressing)     Start:  01/25/23    Expected End:  02/14/24      05/04/2023: 90% progress. Notes great improvement. Reports she has had moments where she wanted to cancel but has challenged herself to put herself into uncomfortable situations stating, now I know how to cope and get myself through them.       Observe patient's social engagement     Start:  01/25/23         Educate Kathryn Munoz on appropriate behaviors and boundaries     Start:  01/25/23         Work with Kathryn to identify 2 signs that they are engaging in social isolation     Start:  01/25/23          Progress Towards Goals: Progressing  Interventions: CBT, Supportive, and Reframing  Summary:  Kathryn Munoz is a 48 y.o. female who presents with symptoms of anxiety and depression. Patient identifies symptoms to include uncontrollable worry, negative self affect, and anxious feelings.Pt was oriented times 5. Pt was cooperative and engaged. Pt denies SI/HI/AVH.   Reports that she is no longer personalizing feedback and has a better outlook at work.  She reached out to two different nursing programs to address her avoidance and choices, expressing doubts about failure. She has used her progress in managing her emotions as an opportunity to reframe her fears of failure, particularly in light of past experiences with her mental health.   Shares she is feeling stressed, as indicated by grinding her teeth. The fall season is especially challenging due to grief related to changed family traditions in the absence of  loved ones. She visited her mother's graveside, and her son is a great source of support in honoring her mother. She is also experiencing grief with the upcoming holiday season.  She celebrated her self-care action steps, taking time off from work and socializing with friends. She explored ways to be intentional about incorporating moments of self-care into her weekly routine.  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  Identified the impact visits often have citing increased depression and anxiety.  Reflected on ways in which patient can manage supporting her own values/morals while also establishing healthy boundaries.  We also explored ways patient can ensure she is protecting her mental peace following visits.  For homework, she plans to reach out to two friends to schedule two social outings in October.  Suicidal/Homicidal: Nowithout intent/plan  Therapist Response:  Cln utilized active and supportive reflection to create an environment for patient to process recent life stressors and symptoms. Clinician assessed for current stressors, symptoms, safety since last session.  Patient to identify ways in which she can utilize CBT to assist in reframing thought problems of fortune telling as well as reframing fears about failure.  Reflected on and celebrated patient's efforts to incorporate self-care into her routine.  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  Plan: Return again in 2 weeks.  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 Will review psychiatrists' notes. Check in with the patient and will see LCSW per availability. Patient agreed with treatment recommendations.    Patient/Guardian was advised Release of Information must be obtained prior to any record release in order to collaborate their care with an outside provider. Patient/Guardian was advised if they have not already done so to contact the registration department to sign all necessary forms in order for us  to release information regarding their care.   Consent: Patient/Guardian gives verbal consent for treatment and assignment of benefits for services provided during this visit. Patient/Guardian expressed understanding and agreed to proceed.   Kathryn KATHEE Husband, LCSW 12/13/2023

## 2023-12-14 ENCOUNTER — Encounter: Payer: Self-pay | Admitting: Internal Medicine

## 2023-12-14 ENCOUNTER — Ambulatory Visit: Admitting: Internal Medicine

## 2023-12-14 VITALS — BP 128/70 | HR 61 | Resp 16 | Ht 65.0 in | Wt 196.0 lb

## 2023-12-14 DIAGNOSIS — G43E09 Chronic migraine with aura, not intractable, without status migrainosus: Secondary | ICD-10-CM | POA: Diagnosis not present

## 2023-12-14 NOTE — Progress Notes (Signed)
   Acute Office Visit  Subjective:     Patient ID: Kathryn Munoz, female    DOB: [birth year], 49 y.o.   MRN: [id number]  Chief Complaint  Patient presents with   Medical Management of Chronic Issues    HPI Patient is in today for follow up on headaches and vertigo.   Discussed the use of AI scribe software for clinical note transcription with the patient, who gave verbal consent to proceed.  History of Present Illness Kathryn Munoz is a 48 year old female who presents for follow-up on her headaches.  She experienced a recent headache episode and used Imitrex  once, which was effective. She also used meclizine  once for dizziness. Over the past month, she has not experienced frequent headaches.  Previously, migraines occurred approximately once every two to three months, significantly impacting her work. During the recent episode, she took Imitrex  twice, possibly on consecutive days, without taking a second dose within two hours of the first.  She experiences occasional dizziness, described as a sensation requiring her to sit down, but it is not severe. Meclizine  is taken as needed for these symptoms.  She is not currently taking omeprazole  or Uristyle, which were prescribed post-gallbladder surgery. She works in a [employer] and requires medical clearance for respiratory protection due to her work environment.    Review of Systems  Constitutional:  Negative for chills and fever.  Neurological:  Negative for dizziness and headaches.        Objective:    BP 128/70   Pulse 61   Resp 16   Ht 5' 5 (1.651 m)   Wt 196 lb (88.9 kg)   SpO2 99%   BMI 32.62 kg/m  BP Readings from Last 3 Encounters:  12/14/23 128/70  11/30/23 124/72  11/12/23 128/80   Wt Readings from Last 3 Encounters:  12/14/23 196 lb (88.9 kg)  11/30/23 196 lb 6.4 oz (89.1 kg)  11/12/23 203 lb 4.8 oz (92.2 kg)      Physical Exam Constitutional:      Appearance: Normal appearance.  HENT:      Head: Normocephalic and atraumatic.  Eyes:     Conjunctiva/sclera: Conjunctivae normal.  Cardiovascular:     Rate and Rhythm: Normal rate and regular rhythm.  Pulmonary:     Effort: Pulmonary effort is normal.     Breath sounds: Normal breath sounds.  Skin:    General: Skin is warm and dry.  Neurological:     General: No focal deficit present.     Mental Status: She is alert. Mental status is at baseline.  Psychiatric:        Mood and Affect: Mood normal.        Behavior: Behavior normal.     No results found for any visits on 12/14/23.      Assessment & Plan:    Assessment & Plan  Migraine, unspecified, not intractable, without status migrainosus Migraines occur every two to three months, effectively managed with Imitrex  and anti-inflammatory. Preventative medication not needed due to infrequency. - Continue Imitrex  as needed. - Monitor frequency and effectiveness. - Contact provider if frequency increases or Imitrex  is ineffective. - Refill Imitrex  prescription as needed.  Vertigo Occasional dizziness managed with meclizine . Meclizine  effective for vertigo but not for orthostatic hypotension-related dizziness. - Continue meclizine  as needed for vertigo. - Avoid meclizine  for orthostatic hypotension-related dizziness.   Return in about 6 months (around 06/12/2024).  Sharyle Fischer, DO

## 2023-12-21 ENCOUNTER — Telehealth: Admitting: Psychiatry

## 2023-12-21 ENCOUNTER — Encounter: Payer: Self-pay | Admitting: Psychiatry

## 2023-12-21 DIAGNOSIS — F3181 Bipolar II disorder: Secondary | ICD-10-CM

## 2023-12-21 DIAGNOSIS — F5105 Insomnia due to other mental disorder: Secondary | ICD-10-CM | POA: Diagnosis not present

## 2023-12-21 DIAGNOSIS — F429 Obsessive-compulsive disorder, unspecified: Secondary | ICD-10-CM

## 2023-12-21 MED ORDER — QUETIAPINE FUMARATE 25 MG PO TABS: 12.2500 mg | ORAL_TABLET | Freq: Every day | ORAL | Status: DC

## 2023-12-21 NOTE — Progress Notes (Signed)
 Virtual Visit via Video Note  I connected with Kathryn Munoz on 12/21/23 at  9:30 AM EDT by a video enabled telemedicine application and verified that I am speaking with the correct person using two identifiers.  Location Provider Location : [facility] Patient Location : Work  Participants: Patient , Provider    I discussed the limitations of evaluation and management by telemedicine and the availability of in person appointments. The patient expressed understanding and agreed to proceed.   I discussed the assessment and treatment plan with the patient. The patient was provided an opportunity to ask questions and all were answered. The patient agreed with the plan and demonstrated an understanding of the instructions.   The patient was advised to call back or seek an in-person evaluation if the symptoms worsen or if the condition fails to improve as anticipated.   BH MD OP Progress Note  12/21/2023 9:56 AM ABENA ERDMAN  MRN:  [id number]  Chief Complaint:  Chief Complaint  Patient presents with   Follow-up   Depression   Anxiety   Medication Refill   Insomnia   Discussed the use of AI scribe software for clinical note transcription with the patient, who gave verbal consent to proceed.  History of Present Illness Kathryn Munoz is a 48 year old African-American female, employed, lives in [place], has a history of bipolar disorder, insomnia, hypothyroidism, OCD, gastric bypass surgery (02/2023) was evaluated by telemedicine today.  Since her last visit in July, she reports attempting to decrease her Seroquel  dose to 12.5 mg as needed and start trazodone , while continuing Lamictal  and Celexa . After a few days, she reverted to her previous regimen of Seroquel  25 mg at bedtime. Feeling mentally overwhelmed at the time, she expresses concern that making medication changes during a difficult period would worsen her symptoms. She states that she preferred to maintain stability until she got  through the rough patch.  Currently, she describes her mood as stable and notes that things in her life have improved. She denies any recent sadness or hopelessness in the past 2 weeks. About a month ago, she experienced a very rough week or 2, during which she felt significantly depressed and reached out to her therapist, Evalene, for support. She does not report any current depressive symptoms.  She continues to experience ongoing sleep difficulties, characterized by waking up in the middle of the night, and she believes possible racing thoughts contribute to this. She remains concerned about weight gain, which she believes may relate to Seroquel  or her eating behaviors. She is currently taking Seroquel  25 mg at bedtime. She reports not taking hydroxyzine  in months .   She denies any suicidality, homicidality or perceptual disturbances.   Visit Diagnosis:    ICD-10-CM   1. Bipolar II disorder in full remission (HCC)  F31.81    Most recent episode depressed    2. 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  F42.9     3. Insomnia due to mental condition  F51.05 QUEtiapine  (SEROQUEL ) 25 MG tablet   mood symptoms      Past Psychiatric History: I have reviewed past psychiatric history from progress note on 11/08/2017.  Past trials of Celexa .  Suicide attempt at the age of 71 by overdose on pills.  Another attempt at the age of 24, slept through it and did not tell anyone.  Past Medical History:  Past Medical History:  Diagnosis Date   Abnormal mammogram    breast biopsy, [facility] 2014  ADD (attention deficit disorder)    Allergy 2017   Anxiety    Bipolar 2 disorder (HCC)    Constipation    Depression    Dermatitis    GERD (gastroesophageal reflux disease) 2017   Heavy menstrual bleeding    History of gestational diabetes    History of kidney stones 2014   Hypothyroidism    IFG (impaired fasting glucose)    Insulin  resistance    Irregular periods    Joint pain     Multiple food allergies    Dates and nuts   Neuropathy    Obesity    OCD (obsessive compulsive disorder)    Prediabetes    Vitamin D  deficiency disease     Past Surgical History:  Procedure Laterality Date   BREAST BIOPSY Left 2014   us  bx/clip-neg   INTRAUTERINE DEVICE INSERTION  03/11/2012   TRIGGER FINGER RELEASE Right 04/21/2021    Family Psychiatric History: I have reviewed family psychiatric history from progress note on 11/08/2017.  Family History:  Family History  Problem Relation Age of Onset   Diabetes Mother    Pancreatitis Mother    Hypertension Mother    Alcohol abuse Mother    Schizophrenia Mother    Depression Mother    Heart disease Mother    Stroke Mother    Thyroid  disease Mother    Kidney disease Mother    Anxiety disorder Mother    Alcohol abuse Father    Diabetes Sister        pre-diabetic   Obesity Sister    Diabetes Brother    Drug abuse Brother    Stroke Brother    ADD / ADHD Son    Intellectual disability Son    Learning disabilities Son    Eczema Son    Breast cancer Cousin        pat cousin   Anxiety disorder Other    Ovarian cancer Neg Hx     Social History: I have reviewed social history from progress note on 11/08/2017. Social History   Socioeconomic History   Marital status: Married    Spouse name: Sumayyah Custodio   Number of children: 2   Years of education: Not on file   Highest education level: Bachelor's degree (e.g., BA, AB, BS)  Occupational History   Occupation: Charity fundraiser  Tobacco Use   Smoking status: Never   Smokeless tobacco: Never  Vaping Use   Vaping status: Never Used  Substance and Sexual Activity   Alcohol use: Yes    Comment: Ocassionally   Drug use: No   Sexual activity: Yes    Partners: Male    Birth control/protection: I.U.D., Other-see comments    Comment: Mirena   Other Topics Concern   Not on file  Social History Narrative   Not on file   Social Drivers of Health   Financial Resource Strain:  Low Risk  (11/30/2023)   Overall Financial Resource Strain (CARDIA)    Difficulty of Paying Living Expenses: Not hard at all  Food Insecurity: No Food Insecurity (11/30/2023)   Hunger Vital Sign    Worried About Running Out of Food in the Last Year: Never true    Ran Out of Food in the Last Year: Never true  Transportation Needs: No Transportation Needs (11/30/2023)   PRAPARE - Administrator, Civil Service (Medical): No    Lack of Transportation (Non-Medical): No  Physical Activity: Inactive (11/30/2023)   Exercise Vital Sign  Days of Exercise per Week: 0 days    Minutes of Exercise per Session: 0 min  Stress: Stress Concern Present (11/30/2023)   [employer] of Occupational Health - Occupational Stress Questionnaire    Feeling of Stress: Very much  Social Connections: Moderately Integrated (11/30/2023)   Social Connection and Isolation Panel    Frequency of Communication with Friends and Family: More than three times a week    Frequency of Social Gatherings with Friends and Family: More than three times a week    Attends Religious Services: More than 4 times per year    Active Member of [employer] or Organizations: No    Attends Banker Meetings: Never    Marital Status: Married    Allergies:  Allergies  Allergen Reactions   Date Seed Extract  [Zizyphus Jujuba] Anaphylaxis, Cough, Itching and Swelling   Other Swelling    Metabolic Disorder Labs: Lab Results  Component Value Date   HGBA1C 5.7 (H) 11/28/2022   MPG 117 11/28/2022   MPG 126 04/13/2022   Lab Results  Component Value Date   PROLACTIN 10.9 11/08/2022   Lab Results  Component Value Date   CHOL 188 11/30/2023   TRIG 72 11/30/2023   HDL 53 11/30/2023   CHOLHDL 3.5 11/30/2023   VLDL 14 10/16/2016   LDLCALC 119 (H) 11/30/2023   LDLCALC 152 (H) 11/28/2022   Lab Results  Component Value Date   TSH 1.40 11/30/2023   TSH 3.71 05/21/2023    Therapeutic Level Labs: No results found  for: LITHIUM No results found for: VALPROATE No results found for: CBMZ  Current Medications: Current Outpatient Medications  Medication Sig Dispense Refill   hydrOXYzine  (VISTARIL ) 25 MG capsule Take 25-75 mg by mouth at bedtime as needed.     Biotin 5000 MCG CAPS Take by mouth daily.     Calcium-Vitamin D -Vitamin K (VIACTIV CALCIUM PLUS D) 650-12.5-40 MG-MCG-MCG CHEW Chew by mouth.     citalopram  (CELEXA ) 20 MG tablet Take 1 tablet (20 mg total) by mouth daily. 90 tablet 1   EPINEPHrine  0.3 mg/0.3 mL IJ SOAJ injection INJECT 0.3 MLS INTO THE MUSCLE ONCE FOR 1 DOSE. FOR LIFE-THREATENING ALLERGIC REACTION / ANAPHYLAXIS  1   lamoTRIgine  (LAMICTAL ) 150 MG tablet TAKE 1 TABLET (150 MG TOTAL) BY MOUTH DAILY. TAKE ALONG WITH 25 MG DAILY 90 tablet 1   lamoTRIgine  (LAMICTAL ) 25 MG tablet TAKE 1 TABLET (25 MG TOTAL) BY MOUTH DAILY. TAKE ALONG WITH 150 MG DAILY 90 tablet 1   levonorgestrel  (MIRENA , 52 MG,) 20 MCG/DAY IUD Frequency:UNKNOWN   Dosage:0.0     Instructions:  Note:Dose: UNKNOWN     levothyroxine  (SYNTHROID ) 100 MCG tablet Take 1 tablet (100 mcg total) by mouth daily before breakfast. 90 tablet 1   linaclotide  (LINZESS ) 145 MCG CAPS capsule TAKE 1 CAPSULE BY MOUTH EVERY DAY BEFORE BREAKFAST 90 capsule 3   meclizine  (ANTIVERT ) 12.5 MG tablet Take 1 tablet (12.5 mg total) by mouth 3 (three) times daily as needed for dizziness. 30 tablet 0   metFORMIN  (GLUCOPHAGE ) 500 MG tablet Take 500 mg by mouth.     Multiple Vitamin (MULTIVITAMIN) tablet Take 1 tablet by mouth daily.     ondansetron  (ZOFRAN -ODT) 4 MG disintegrating tablet Take 1 tablet (4 mg total) by mouth every 8 (eight) hours as needed for nausea or vomiting. 20 tablet 0   QUEtiapine  (SEROQUEL ) 25 MG tablet Take 0.5 tablets (12.5 mg total) by mouth at bedtime.     SUMAtriptan  (  IMITREX ) 50 MG tablet Take 1 tablet (50 mg total) by mouth every 2 (two) hours as needed for migraine. May repeat in 2 hours if headache persists or recurs. Do  not exceed 200 mg in 24 hour period. 10 tablet 1   traZODone  (DESYREL ) 50 MG tablet Take 0.5-1 tablets (25-50 mg total) by mouth at bedtime as needed for sleep. 90 tablet 0   No current facility-administered medications for this visit.     Musculoskeletal: Strength & Muscle Tone: UTA Gait & Station: Seated Patient leans: N/A  Psychiatric Specialty Exam: Review of Systems  Psychiatric/Behavioral:  Positive for sleep disturbance.     There were no vitals taken for this visit.There is no height or weight on file to calculate BMI.  General Appearance: Casual  Eye Contact:  Fair  Speech:  Clear and Coherent  Volume:  Normal  Mood:  Euthymic  Affect:  Congruent  Thought Process:  Goal Directed and Descriptions of Associations: Intact  Orientation:  Full (Time, Place, and Person)  Thought Content: Logical   Suicidal Thoughts:  No  Homicidal Thoughts:  No  Memory:  Immediate;   Fair Recent;   Fair Remote;   Fair  Judgement:  Fair  Insight:  Fair  Psychomotor Activity:  Normal  Concentration:  Concentration: Fair and Attention Span: Fair  Recall:  [name] of Knowledge: Fair  Language: Fair  Akathisia:  No  Handed:  Right  AIMS (if indicated): not done  Assets:  Communication Skills Desire for Improvement Housing Social Support  ADL's:  Intact  Cognition: WNL  Sleep:  varies   Screenings: Geneticist, molecular Office Visit from 09/20/2023 in [facility] Office Visit from 11/08/2022 in [facility] Video Visit from 10/24/2021 in [facility] Video Visit from 07/27/2021 in [facility] Video Visit from 10/28/2020 in [facility]  AIMS Total Score 0 0 0 0 0   GAD-7    Flowsheet Row Office Visit from 09/20/2023 in [facility] Counselor from  01/18/2023 in [facility] Office Visit from 11/28/2022 in [facility] Office Visit from 11/08/2022 in [facility] Office Visit from 04/13/2022 in [facility]  Total GAD-7 Score 0 17 21 18  0   PHQ2-9    Flowsheet Row Video Visit from 12/21/2023 in [facility] Office Visit from 12/14/2023 in [facility] Office Visit from 11/30/2023 in [facility] Office Visit from 09/20/2023 in [facility] Office Visit from 05/21/2023 in [facility]  PHQ-2 Total Score 0 0 0 0 0  PHQ-9 Total Score -- 0 -- -- --   Flowsheet Row Video Visit from 12/21/2023 in [facility] Office Visit from 09/20/2023 in [facility] Video Visit from 06/15/2023 in [facility]  C-SSRS RISK CATEGORY Moderate Risk No Risk Moderate Risk     Assessment and Plan: LASHAWNA POCHE is a 48 year old African-American female, married, employed, lives in [place], has a history of bipolar disorder, sleep problems, OCD, hypothyroidism was evaluated by telemedicine today.  Discussed assessment and plan as noted below.  1. Bipolar II disorder in full remission (HCC), most recent episode depressed Currently  denies any significant mood symptoms.  Although the plan was to taper off Seroquel  last visit she is currently back on it and continues to be interested in a gradual tapering. Continue Lamictal  125 mg daily Continue Seroquel  25 mg at bedtime for now.  If mood remains stable could reduce Seroquel  to 12.5 mg while increasing the dose of Trazodone  to 50 mg at bedtime.  2. 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-stable Currently well-managed  on the current medication regimen. Continue Celexa  20 mg daily Continue psychotherapy sessions with Ms. Evalene Husband.  3. Insomnia due to mental condition-unstable Does have sleep problems due to racing thoughts. Could reduce Seroquel  in the future to 12.5 mg at bedtime with plan to gradually taper off. Increase Trazodone  to 50 mg daily at bedtime with plan to titrate dosage up as needed Continue Hydroxyzine  25 to 75 mg at bedtime as needed for sleep.  Follow-up Follow-up in clinic in 8 weeks or sooner if needed.  Collaboration of Care: Collaboration of Care: Referral or follow-up with counselor/therapist AEB patient encouraged to continue psychotherapy sessions, I have reviewed notes per Ms. Perkins dated 12/13/2023 currently engaged in CBT, supportive and reframing.  Patient/Guardian was advised Release of Information must be obtained prior to any record release in order to collaborate their care with an outside provider. Patient/Guardian was advised if they have not already done so to contact the registration department to sign all necessary forms in order for us  to release information regarding their care.   Consent: Patient/Guardian gives verbal consent for treatment and assignment of benefits for services provided during this visit. Patient/Guardian expressed understanding and agreed to proceed.   This note was generated in part or whole with voice recognition software. Voice recognition is usually quite accurate but there are transcription errors that can and very often do occur. I apologize for any typographical errors that were not detected and corrected.    Seraphim Trow, MD 12/21/2023, 9:56 AM

## 2023-12-24 ENCOUNTER — Ambulatory Visit (INDEPENDENT_AMBULATORY_CARE_PROVIDER_SITE_OTHER): Admitting: Licensed Clinical Social Worker

## 2023-12-24 DIAGNOSIS — F429 Obsessive-compulsive disorder, unspecified: Secondary | ICD-10-CM

## 2023-12-24 DIAGNOSIS — F3181 Bipolar II disorder: Secondary | ICD-10-CM

## 2023-12-24 NOTE — Progress Notes (Signed)
 THERAPIST PROGRESS NOTE  Virtual Visit via Video Note  I connected with Kathryn Munoz Munoz on 12/24/23 at 3:05pm by a video enabled telemedicine application and verified that I am speaking with the correct person using two identifiers.  Location: Patient: Work Address Provider: Providers Address   I discussed the limitations of evaluation and management by telemedicine and the availability of in person appointments. The patient expressed understanding and agreed to proceed.   I discussed the assessment and treatment plan with the patient. The patient was provided an opportunity to ask questions and all were answered. The patient agreed with the plan and demonstrated an understanding of the instructions.   The patient was advised to call back or seek an in-person evaluation if the symptoms worsen or if the condition fails to improve as anticipated.  I provided 46 minutes of non-face-to-face time during this encounter.   Kathryn Munoz KATHEE Husband, LCSW   Session Time: 3:05pm-3:51pm  Participation Level: Active  Behavioral Response: CasualAlertEuthymic  Type of Therapy: Individual Therapy  Treatment Goals addressed:  Active     BH CCP BIPOLAR DISORDER-MANIA/HYPOMANIA     LTG: Kathryn Munoz will stabilize mood and increase goal-directed behavior as measured by self report  (Progressing)     Start:  01/25/23    Expected End:  02/14/24      05/04/2023: 87% progress. Pt reports she feels like having someone to talk to whom she trusts and who can assist in challenge perspectives has helped her know that she can stand up for herself and handle push back. Pt states, I'm not as afraid of certain situations. Mood is up.       STG: Kathryn Munoz will identify cognitive patterns and beliefs that interfere with therapy (Progressing)     Start:  01/25/23    Expected End:  02/14/24      05/04/2023: 87% progress. Pt states, I have still had some work to do but reaching goals as far of therapy I feel I'm  hitting most pf the points that were my focus for therapy.       Work with Kathryn Munoz to track symptoms, triggers, and/or skill use through a mood chart, diary card, or journal     Start:  01/25/23         Work with Kathryn Munoz to develop at least a list of 2 triggers for a manic episode     Start:  01/25/23         Work with Kathryn Munoz to develop a crisis plan which includes a list of a minimum of 2 triggers for emotional dysregulation with corresponding skills to cope ahead with each trigger     Start:  01/25/23           [name]     LTG: Reduce frequency, intensity, and duration of depression symptoms so that daily functioning is improved (Progressing)     Start:  01/25/23    Expected End:  02/14/24      05/04/2023: Definitely managing them a lot better. There has been something that have happened where I would not have been able to process them at all.SABRASABRAI've been able to mature and process and understand more how and why I was reacting to the situation. I definitely feel I'm accomplishing that part of what was causing me depression at the moment. Overall, I really have a good outlook.        STG: Kathryn Munoz Munoz will identify cognitive patterns and beliefs that support depression (Progressing)  Start:  01/25/23    Expected End:  02/14/24      05/04/2023: Reports thought inner child work she has learned to use her voice and assess whom she can trust. Reports improved confidence which assist in challenging negative cognitions.       Work with Kathryn Munoz to track symptoms, triggers, and/or skill use through a mood chart, diary card, or journal     Start:  01/25/23         Work with Kathryn Munoz to identify the major components of a recent episode of depression: physical symptoms, major thoughts and images, and major behaviors they experienced     Start:  01/25/23         Kathryn Munoz will identify 2 trauma related cognitive distortions     Start:  01/25/23         Kathryn Munoz will identify 3  cognitive distortions they are currently using and write reframing statements to replace them     Start:  01/25/23           Social Interpersonal Effectiveness     LTG: Kathryn Munoz Munoz will recognize socially inappropriate behaviors and develop alternative behaviors (Progressing)     Start:  01/25/23    Expected End:  02/14/24         STG: Kathryn Munoz will identify 2 behaviors that engage in social isolation  (Progressing)     Start:  01/25/23    Expected End:  02/14/24      05/04/2023: 90% progress. Notes great improvement. Reports she has had moments where she wanted to cancel but has challenged herself to put herself into uncomfortable situations stating, now I know how to cope and get myself through them.       Observe patient's social engagement     Start:  01/25/23         Educate Kathryn Munoz on appropriate behaviors and boundaries     Start:  01/25/23         Work with Kathryn Munoz to identify 2 signs that they are engaging in social isolation     Start:  01/25/23          Progress Towards Goals: Progressing  Interventions: CBT, Solution Focused, Supportive, and Reframing  Summary: Kathryn Munoz Munoz is a 48 y.o. female who presents with symptoms of anxiety and depression. Patient identifies symptoms to include uncontrollable worry, negative self affect, and anxious feelings.Pt was oriented times 5. Pt was cooperative and engaged. Pt denies SI/HI/AVH.   Patient reflected on the recent psychiatry meeting and expressed hope about the adjustments made to my medication.  She reported feeling stressed about work but mentioned that she consider this to be a normal experience. She continues to think about understanding why someone may react a certain way versus reacting based solely on emotions. She noted an increased control over reactions both at home and at work. Cln provided psychoeducation to help her understand that my window of tolerance is growing.  Patient stated, I'm in a good place  mentally, and shared that she will continue to make plans to socialize with friends.  She also reflected on a previous session where she processed misplaced guilt regarding her brother and role as his caretaker. Reported hope to find a balance between handling logistics and socializing.  Reflected on recent uses of self care to help her be present. Reports she has been able to be engaged and stay off her phone.   Homework: Patient was asked ot contrust a letter to her future  self to share her healthier mindset should she need a reminder in the future.    Suicidal/Homicidal: Nowithout intent/plan  Therapist Response: Cln utilized active and supportive reflection to create an environment for patient to process recent life stressors and symptoms. Clinician assessed for current stressors, symptoms, safety since last session.  Clinician worked with patient to reflect on growth mindset and her ability to reframe negative cognitions on her own.  Worked with patient to challenge thought problems regarding reading into the future around fear of seeing her brother this coming Friday.  Reflected on controllable factors and solutions to aiding in fostering a healthy meeting on Friday.  Reflected on self-care and encouraged patient to continue to find moments for herself as well as socialize with others.  Plan: Return again in 2 weeks.  Diagnosis: Bipolar II disorder in full remission (HCC)  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   Collaboration of Care: AEB psychiatrist can access notes and cln. Will review psychiatrists' notes. Check in with the patient and will see LCSW per availability. Patient agreed with treatment recommendations.   Patient/Guardian was advised Release of Information must be obtained prior to any record release in order to collaborate their care with an outside provider. Patient/Guardian was advised if they have not already done so to contact the registration  department to sign all necessary forms in order for us  to release information regarding their care.   Consent: Patient/Guardian gives verbal consent for treatment and assignment of benefits for services provided during this visit. Patient/Guardian expressed understanding and agreed to proceed.   Kathryn Munoz KATHEE Husband, LCSW 12/24/2023

## 2024-01-07 ENCOUNTER — Ambulatory Visit: Admitting: Licensed Clinical Social Worker

## 2024-01-07 DIAGNOSIS — F429 Obsessive-compulsive disorder, unspecified: Secondary | ICD-10-CM

## 2024-01-07 DIAGNOSIS — F3181 Bipolar II disorder: Secondary | ICD-10-CM

## 2024-01-07 NOTE — Progress Notes (Signed)
 THERAPIST PROGRESS NOTE  Virtual Visit via Video Note  I connected with Kathryn Munoz on 01/07/24 at 10:00 AM EDT by a video enabled telemedicine application and verified that I am speaking with the correct person using two identifiers.  Location: Patient: Work Address Provider: Providers Address   I discussed the limitations of evaluation and management by telemedicine and the availability of in person appointments. The patient expressed understanding and agreed to proceed.  I discussed the assessment and treatment plan with the patient. The patient was provided an opportunity to ask questions and all were answered. The patient agreed with the plan and demonstrated an understanding of the instructions.   The patient was advised to call back or seek an in-person evaluation if the symptoms worsen or if the condition fails to improve as anticipated.  I provided 50 minutes of non-face-to-face time during this encounter.   Kathryn Munoz Husband, LCSW   Session Time: 10-10:50am  Participation Level: Active  Behavioral Response: CasualAlertEuthymic  Type of Therapy: Individual Therapy  Treatment Goals addressed:  Active     BH CCP BIPOLAR DISORDER-MANIA/HYPOMANIA     LTG: Kathryn Munoz will stabilize mood and increase goal-directed behavior as measured by self report  (Progressing)     Start:  01/25/23    Expected End:  02/14/24      05/04/2023: 87% progress. Pt reports she feels like having someone to talk to whom she trusts and who can assist in challenge perspectives has helped her know that she can stand up for herself and handle push back. Pt states, I'm not as afraid of certain situations. Mood is up.       STG: Kathryn Munoz will identify cognitive patterns and beliefs that interfere with therapy (Progressing)     Start:  01/25/23    Expected End:  02/14/24      05/04/2023: 87% progress. Pt states, I have still had some work to do but reaching goals as far of therapy I feel I'm  hitting most pf the points that were my focus for therapy.       Work with Kathryn to track symptoms, triggers, and/or skill use through a mood chart, diary card, or journal     Start:  01/25/23         Work with Kathryn to develop at least a list of 2 triggers for a manic episode     Start:  01/25/23         Work with Kathryn to develop a crisis plan which includes a list of a minimum of 2 triggers for emotional dysregulation with corresponding skills to cope ahead with each trigger     Start:  01/25/23           [name]     LTG: Reduce frequency, intensity, and duration of depression symptoms so that daily functioning is improved (Progressing)     Start:  01/25/23    Expected End:  02/14/24      05/04/2023: Definitely managing them a lot better. There has been something that have happened where I would not have been able to process them at all.SABRASABRAI've been able to mature and process and understand more how and why I was reacting to the situation. I definitely feel I'm accomplishing that part of what was causing me depression at the moment. Overall, I really have a good outlook.        STG: Kathryn Munoz will identify cognitive patterns and beliefs that support depression (Progressing)  Start:  01/25/23    Expected End:  02/14/24      05/04/2023: Reports thought inner child work she has learned to use her voice and assess whom she can trust. Reports improved confidence which assist in challenging negative cognitions.       Work with Kathryn to track symptoms, triggers, and/or skill use through a mood chart, diary card, or journal     Start:  01/25/23         Work with Kathryn to identify the major components of a recent episode of depression: physical symptoms, major thoughts and images, and major behaviors they experienced     Start:  01/25/23         Kathryn Munoz will identify 2 trauma related cognitive distortions     Start:  01/25/23         Kathryn Munoz will identify 3  cognitive distortions they are currently using and write reframing statements to replace them     Start:  01/25/23           Social Interpersonal Effectiveness     LTG: Kathryn Munoz will recognize socially inappropriate behaviors and develop alternative behaviors (Progressing)     Start:  01/25/23    Expected End:  02/14/24         STG: Kathryn Munoz will identify 2 behaviors that engage in social isolation  (Progressing)     Start:  01/25/23    Expected End:  02/14/24      05/04/2023: 90% progress. Notes great improvement. Reports she has had moments where she wanted to cancel but has challenged herself to put herself into uncomfortable situations stating, now I know how to cope and get myself through them.       Observe patient's social engagement     Start:  01/25/23         Educate Kathryn Munoz on appropriate behaviors and boundaries     Start:  01/25/23         Work with Kathryn to identify 2 signs that they are engaging in social isolation     Start:  01/25/23          ProgressTowards Goals: Progressing  Interventions: CBT, Strength-based, Supportive, and Reframing  Summary: Kathryn Munoz is a 48 y.o. female who presents with symptoms of anxiety and depression. Patient identifies symptoms to include uncontrollable worry, negative self affect, and anxious feelings.Pt was oriented times 5. Pt was cooperative and engaged. Pt denies SI/HI/AVH.   During a recent visit with her brother, she reflected on techniques she had discussed in previous sessions to facilitate positive interactions. She reported that the experience was not as difficult as she had anticipated. She also considered her feelings related to her brother's denial about his physical state and limitations. She reflected on potential solutions that she and her family are exploring to support her brother. She shared that she has made improvements in her ability to take on appropriate levels of responsibility, noting that she  is no longer personalizing that responsibility, which has led to a more positive perspective.   Additionally, she reflected on the joy she experienced while spending quality time with her sister. They had vulnerable conversations about how their childhood relationships have influenced them as adults, and she shared feelings of validation regarding their experience of growing up with a parent struggling with addiction.  The patient became emotional while discussing her father with her sister, as she did not get to know him. She reported that asking these questions has  contributed to her progress in her healing journey.  She is taking the next steps to find the answers she seeks to better understand the feelings she is experiencing.  She reported continuing to make progress in socializing with friends. She reflected on her pride in her ability to actively listen rather than just listen to react, noting that this pattern had often contributed to her social anxiety. She shared that she has been mindful of not being negative.  Suicidal/Homicidal: Nowithout intent/plan  Therapist Response: Cln utilized active and supportive reflection to create an environment for patient to process recent life stressors and symptoms. Clinician assessed for current stressors, symptoms, safety since last session.  Clinician praised patient's efforts to be mindful and intentional about her reactions.  Utilized components of CBT to assist patient in reflecting on the impact of reframing negative cognitions and uncontrollable worry some thoughts.  Plan: Return again in 2 weeks.  Diagnosis: Bipolar II disorder in full remission (HCC)  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   Collaboration of Care: AEB psychiatrist can access notes and cln. Will review psychiatrists' notes. Check in with the patient and will see LCSW per availability. Patient agreed with treatment recommendations.   Patient/Guardian was  advised Release of Information must be obtained prior to any record release in order to collaborate their care with an outside provider. Patient/Guardian was advised if they have not already done so to contact the registration department to sign all necessary forms in order for us  to release information regarding their care.   Consent: Patient/Guardian gives verbal consent for treatment and assignment of benefits for services provided during this visit. Patient/Guardian expressed understanding and agreed to proceed.   Kathryn Munoz Husband, LCSW 01/07/2024

## 2024-01-18 ENCOUNTER — Other Ambulatory Visit: Payer: Self-pay | Admitting: Family Medicine

## 2024-01-18 DIAGNOSIS — Z1231 Encounter for screening mammogram for malignant neoplasm of breast: Secondary | ICD-10-CM

## 2024-01-22 ENCOUNTER — Encounter: Payer: Self-pay | Admitting: Licensed Clinical Social Worker

## 2024-01-25 ENCOUNTER — Ambulatory Visit (INDEPENDENT_AMBULATORY_CARE_PROVIDER_SITE_OTHER): Admitting: Licensed Clinical Social Worker

## 2024-01-25 DIAGNOSIS — F429 Obsessive-compulsive disorder, unspecified: Secondary | ICD-10-CM

## 2024-01-25 DIAGNOSIS — F3181 Bipolar II disorder: Secondary | ICD-10-CM

## 2024-01-25 NOTE — Progress Notes (Signed)
 THERAPIST PROGRESS NOTE  Virtual Visit via Video Note  I connected with Kathryn Munoz on 01/25/24 at  9:00 AM EST by a video enabled telemedicine application and verified that I am speaking with the correct person using two identifiers.  Location: Patient: Work [employer]: Providers address   I discussed the limitations of evaluation and management by telemedicine and the availability of in person appointments. The patient expressed understanding and agreed to proceed.   I discussed the assessment and treatment plan with the patient. The patient was provided an opportunity to ask questions and all were answered. The patient agreed with the plan and demonstrated an understanding of the instructions.   The patient was advised to call back or seek an in-person evaluation if the symptoms worsen or if the condition fails to improve as anticipated.  I provided 48 minutes of non-face-to-face time during this encounter.   Kathryn Munoz Husband, LCSW   Session Time: 9-9:48am  Participation Level: Active  Behavioral Response: CasualAlertEuthymic  Type of Therapy: Individual Therapy  Treatment Goals addressed:  Active     BH CCP BIPOLAR DISORDER-MANIA/HYPOMANIA     LTG: Lyfe will stabilize mood and increase goal-directed behavior as measured by self report  (Progressing)     Start:  01/25/23    Expected End:  02/14/24      05/04/2023: 87% progress. Pt reports she feels like having someone to talk to whom she trusts and who can assist in challenge perspectives has helped her know that she can stand up for herself and handle push back. Pt states, I'm not as afraid of certain situations. Mood is up.       STG: Aliza will identify cognitive patterns and beliefs that interfere with therapy (Progressing)     Start:  01/25/23    Expected End:  02/14/24      05/04/2023: 87% progress. Pt states, I have still had some work to do but reaching goals as far of therapy I feel I'm  hitting most pf the points that were my focus for therapy.       Work with Kathryn to track symptoms, triggers, and/or skill use through a mood chart, diary card, or journal     Start:  01/25/23         Work with Kathryn to develop at least a list of 2 triggers for a manic episode     Start:  01/25/23         Work with Kathryn to develop a crisis plan which includes a list of a minimum of 2 triggers for emotional dysregulation with corresponding skills to cope ahead with each trigger     Start:  01/25/23           [name]     LTG: Reduce frequency, intensity, and duration of depression symptoms so that daily functioning is improved (Progressing)     Start:  01/25/23    Expected End:  02/14/24      05/04/2023: Definitely managing them a lot better. There has been something that have happened where I would not have been able to process them at all.SABRASABRAI've been able to mature and process and understand more how and why I was reacting to the situation. I definitely feel I'm accomplishing that part of what was causing me depression at the moment. Overall, I really have a good outlook.        STG: Kaianna will identify cognitive patterns and beliefs that support depression (Progressing)  Start:  01/25/23    Expected End:  02/14/24      05/04/2023: Reports thought inner child work she has learned to use her voice and assess whom she can trust. Reports improved confidence which assist in challenging negative cognitions.       Work with Kathryn to track symptoms, triggers, and/or skill use through a mood chart, diary card, or journal     Start:  01/25/23         Work with Kathryn to identify the major components of a recent episode of depression: physical symptoms, major thoughts and images, and major behaviors they experienced     Start:  01/25/23         Erva will identify 2 trauma related cognitive distortions     Start:  01/25/23         Shlonda will identify 3  cognitive distortions they are currently using and write reframing statements to replace them     Start:  01/25/23           Social Interpersonal Effectiveness     LTG: Maribel will recognize socially inappropriate behaviors and develop alternative behaviors (Progressing)     Start:  01/25/23    Expected End:  02/14/24         STG: Chelsey will identify 2 behaviors that engage in social isolation  (Progressing)     Start:  01/25/23    Expected End:  02/14/24      05/04/2023: 90% progress. Notes great improvement. Reports she has had moments where she wanted to cancel but has challenged herself to put herself into uncomfortable situations stating, now I know how to cope and get myself through them.       Observe patient's social engagement     Start:  01/25/23         Educate Autumnrose on appropriate behaviors and boundaries     Start:  01/25/23         Work with Kathryn to identify 2 signs that they are engaging in social isolation     Start:  01/25/23            ProgressTowards Goals: Progressing  Interventions: Solution Focused, Assertiveness Training, and Supportive  Summary: Kathryn Munoz is a 48 y.o. female who presents with symptoms of anxiety and depression. Patient identifies symptoms to include uncontrollable worry, negative self affect, and anxious feelings.Pt was oriented times 5. Pt was cooperative and engaged. Pt denies SI/HI/AVH.   She reflected on many personal experiences that she is celebrating.   After participating in grief counseling at work and remembering how much she misses her mother this holiday season, she has felt more confident in sharing her coping skills with others. She plans to be intentional in her approach.  While going through [employer], she realized that she had been avoiding her feelings of grief. However, she has also noticed that there are some moments when she allows herself the time to grieve. She reports being able  to talk about her mother and has made a conscious effort to focus on the good memories rather than dwelling on her absence. She is practicing gratitude and finding ways to honor her mother this holiday season.  She feels proud of her progress in processing grief and of her acknowledgment of her tendencies to avoid painful feelings. She has gained a better understanding of her triggers and shifts in her mental health, and she is checking in with herself and using grounding techniques  regularly.  Additionally, she has reflected on the barriers in her environment that affect her sense of safety and comfort. She has considered the various stressors she faces and potential solutions to address them. Cln and patient roleplayed conversations to be had with her husband related to love languages and solutions to aid in emotional connection.    Suicidal/Homicidal: Nowithout intent/plan  Therapist Response:  Cln utilized active and supportive reflection to create an environment for patient to process recent life stressors and symptoms. Clinician assessed for current stressors, symptoms, safety since last session. Reflected on progress with grief and challenged patterns of avoidance. Explored emotions around connectivity within personal relationships.   Plan: Return again in 2 weeks.  Diagnosis: Bipolar II disorder in full remission (HCC)  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   Collaboration of Care: AEB psychiatrist can access notes and cln. Will review psychiatrists' notes. Check in with the patient and will see LCSW per availability. Patient agreed with treatment recommendations.   Patient/Guardian was advised Release of Information must be obtained prior to any record release in order to collaborate their care with an outside provider. Patient/Guardian was advised if they have not already done so to contact the registration department to sign all necessary forms in order for us  to  release information regarding their care.   Consent: Patient/Guardian gives verbal consent for treatment and assignment of benefits for services provided during this visit. Patient/Guardian expressed understanding and agreed to proceed.   Kathryn Munoz Husband, LCSW 01/25/2024

## 2024-01-30 ENCOUNTER — Ambulatory Visit: Payer: Self-pay

## 2024-01-30 ENCOUNTER — Encounter: Payer: Self-pay | Admitting: Internal Medicine

## 2024-01-30 NOTE — Telephone Encounter (Signed)
 FYI Only or Action Required?: FYI only for provider: appointment scheduled on 01/31/24.  Patient was last seen in primary care on 12/14/2023 by Bernardo Fend, DO.  Called Nurse Triage reporting Migraine.  Symptoms began several days ago.  Interventions attempted: Prescription medications: imitrex  and zofran .  Symptoms are: unchanged.  Triage Disposition: See Physician Within 24 Hours  Patient/caregiver understands and will follow disposition?:   Reason for Disposition  [1] MODERATE headache (e.g., interferes with normal activities) AND [2] present > 24 hours AND [3] unexplained  (Exceptions: Pain medicines not tried, typical migraine, or headache part of viral illness.)  Answer Assessment - Initial Assessment Questions Migraine with photophobia and nausea x 3 days. No improvement with imitrex  and zofran . Patient denies higher acuity questions.  Scheduled soonest available OV (next day). Advised that she may also seek medical care today at [facility] or in the ED if she would like as they may be able to provide medication to make her more comfortable.   Advised she may also take tylenol or an NSAID like aleve or ibuprofen after confirming no hx of bleeding disorders, ulcers or altered LFT or renal values.   Reviewed instructions for the Imitrex .  1. LOCATION: Where does it hurt?      Top and left  2. ONSET: When did the headache start? (e.g., minutes, hours, days)      3 days  3. PATTERN: Does the pain come and go, or has it been constant since it started?     Intermittent, brief periods of relief  4. SEVERITY: How bad is the pain? and What does it keep you from doing?  (e.g., Scale 1-10; mild, moderate, or severe)     8/10  5. RECURRENT SYMPTOM: Have you ever had headaches before? If Yes, ask: When was the last time? and What happened that time?      Unknown last had a migraine  6. CAUSE: What do you think is causing the headache?     Migraine  7. MIGRAINE:  Have you been diagnosed with migraine headaches? If Yes, ask: Is this headache similar?      Yes, hx of migraines  Protocols used: [facility]  Copied from CRM [id number]. Topic: Clinical - Red Word Triage >> Jan 30, 2024  4:15 PM Joesph NOVAK wrote: Red Word that prompted transfer to Nurse Triage: Migraines with nausea, going on for 3 days.

## 2024-01-31 ENCOUNTER — Ambulatory Visit: Admitting: Internal Medicine

## 2024-01-31 ENCOUNTER — Encounter: Payer: Self-pay | Admitting: Internal Medicine

## 2024-01-31 ENCOUNTER — Other Ambulatory Visit: Payer: Self-pay

## 2024-01-31 VITALS — BP 128/76 | HR 75 | Temp 98.3°F | Resp 16 | Ht 65.0 in | Wt 201.7 lb

## 2024-01-31 DIAGNOSIS — G43E11 Chronic migraine with aura, intractable, with status migrainosus: Secondary | ICD-10-CM | POA: Diagnosis not present

## 2024-01-31 DIAGNOSIS — M62838 Other muscle spasm: Secondary | ICD-10-CM | POA: Diagnosis not present

## 2024-01-31 MED ORDER — PROMETHAZINE HCL 25 MG PO TABS: 25.0000 mg | ORAL_TABLET | Freq: Once | ORAL | Status: DC

## 2024-01-31 MED ORDER — NURTEC 75 MG PO TBDP: 75.0000 mg | ORAL_TABLET | Freq: Every day | ORAL | 2 refills | Status: DC | PRN

## 2024-01-31 MED ORDER — SUMATRIPTAN SUCCINATE 50 MG PO TABS: 50.0000 mg | ORAL_TABLET | ORAL | 1 refills | Status: DC | PRN

## 2024-01-31 MED ORDER — PROMETHAZINE HCL 25 MG/ML IJ SOLN
25.0000 mg | Freq: Four times a day (QID) | INTRAMUSCULAR | Status: AC | PRN
  Administered 2024-01-31: 25 mg via INTRAMUSCULAR

## 2024-01-31 MED ORDER — KETOROLAC TROMETHAMINE 60 MG/2ML IM SOLN
60.0000 mg | Freq: Once | INTRAMUSCULAR | Status: AC
  Administered 2024-01-31: 60 mg via INTRAMUSCULAR

## 2024-01-31 NOTE — Progress Notes (Signed)
 Acute Office Visit  Subjective:     Patient ID: Kathryn Munoz, female    DOB: 12-Aug-1975, 48 y.o.   MRN: [id number]  Chief Complaint  Patient presents with   Migraine    HPI Patient is in today for migraine.   Discussed the use of AI scribe software for clinical note transcription with the patient, who gave verbal consent to proceed.  History of Present Illness TELETHA PETREA is a 48 year old female who presents with persistent headaches and migraines. She is accompanied by her husband.  She experiences a persistent headache that began last night, primarily located behind her eyes and sometimes between her ears. Imitrex , her usual medication, has not been effective. She last took Imitrex  last night and has not exceeded the recommended dosage. She received a Toradol  injection a couple of months ago, which was helpful.  She is concerned about dehydration contributing to her headaches due to inadequate fluid intake. She has experienced calf cramps over the past two weeks and uses Liquid IV for hydration.  She experiences some visual disturbances with blurry vision, is not wearing her glasses, and avoids reading. She also has nausea and has vomited a couple of times recently. Palpitations are present, possibly related to anxiety about her health issues.   Review of Systems  Eyes:  Positive for blurred vision.  Gastrointestinal:  Positive for nausea.  Neurological:  Positive for headaches. Negative for weakness.        Objective:    BP 128/76 (Cuff Size: Large)   Pulse 75   Temp 98.3 F (36.8 C) (Oral)   Resp 16   Ht 5' 5 (1.651 m)   Wt 201 lb 11.2 oz (91.5 kg)   SpO2 98%   BMI 33.56 kg/m    Physical Exam Constitutional:      Appearance: Normal appearance.  HENT:     Head: Normocephalic and atraumatic.  Eyes:     Conjunctiva/sclera: Conjunctivae normal.  Cardiovascular:     Rate and Rhythm: Normal rate and regular rhythm.  Pulmonary:     Effort: Pulmonary  effort is normal.     Breath sounds: Normal breath sounds.  Skin:    General: Skin is warm and dry.  Neurological:     General: No focal deficit present.     Mental Status: She is alert. Mental status is at baseline.  Psychiatric:        Mood and Affect: Mood normal.        Behavior: Behavior normal.     No results found for any visits on 01/31/24.      Assessment & Plan:   Assessment & Plan Chronic migraine with aura, intractable, with status migrainosus and associated nausea Chronic migraines with aura, intractable, with status migrainosus. Imitrex  ineffective. Dehydration and poor sleep may contribute. Severe headache with ocular and temporal pain, nausea, vomiting. Insurance may cover Nurtec for abortive and preventive use. - Administered Toradol  injection for acute relief. - Administered Phenergan injection for nausea. - Refilled Imitrex , 50-100 mg as needed, max 200 mg/24 hours. - Initiated prior authorization for Nurtec. - Advised hydration with electrolyte solutions. - Recommended rest in dark room, avoid screens. - Scheduled follow-up in one month for headache frequency and Nurtec approval.  Muscle cramps likely related to dehydration Muscle cramps, especially in calves, likely due to dehydration. Inadequate fluid intake noted. - Advised increased fluid intake with electrolyte solutions.  Palpitations likely anxiety-related Palpitations likely anxiety-related, no recent EKG since 2021. No  cardiac symptoms, exacerbated by pain and anxiety. - Monitor palpitations, consider EKG if symptoms persist or worsen.  - Rimegepant Sulfate (NURTEC) 75 MG TBDP; Take 1 tablet (75 mg total) by mouth daily as needed (migraine).  Dispense: 30 tablet; Refill: 2 - SUMAtriptan  (IMITREX ) 50 MG tablet; Take 1-2 tablets (50-100 mg total) by mouth every 2 (two) hours as needed for migraine. May repeat in 2 hours if headache persists or recurs. Do not exceed 200 mg in 24 hour period.  Dispense:  10 tablet; Refill: 1 - ketorolac  (TORADOL ) injection 60 mg - promethazine (PHENERGAN) injection 25 mg   Return in about 4 weeks (around 02/28/2024) for migraine follow up.  Sharyle Fischer, DO

## 2024-02-01 ENCOUNTER — Other Ambulatory Visit (HOSPITAL_COMMUNITY): Payer: Self-pay

## 2024-02-01 ENCOUNTER — Telehealth: Payer: Self-pay | Admitting: Pharmacy Technician

## 2024-02-01 NOTE — Telephone Encounter (Signed)
 Pharmacy Patient Advocate Encounter  Received notification from [facility] that Prior Authorization for Nurtec 75MG  dispersible tablets has been DENIED.  Full denial letter will be uploaded to the media tab. See denial reason below.     PA #/Case ID/Reference #: [id number]

## 2024-02-01 NOTE — Telephone Encounter (Signed)
 Pharmacy Patient Advocate Encounter   Received notification from CoverMyMeds that prior authorization for Nurtec 75MG  dispersible tablets is required/requested.   Insurance verification completed.   The patient is insured through [facility].   Per test claim: PA required; PA started via CoverMyMeds. KEY [id number] . Waiting for clinical questions to populate.

## 2024-02-08 ENCOUNTER — Ambulatory Visit (INDEPENDENT_AMBULATORY_CARE_PROVIDER_SITE_OTHER): Admitting: Licensed Clinical Social Worker

## 2024-02-08 DIAGNOSIS — F429 Obsessive-compulsive disorder, unspecified: Secondary | ICD-10-CM

## 2024-02-08 DIAGNOSIS — F3181 Bipolar II disorder: Secondary | ICD-10-CM | POA: Diagnosis not present

## 2024-02-08 NOTE — Progress Notes (Signed)
 THERAPIST PROGRESS NOTE  Virtual Visit via Video Note  I connected with Kathryn Munoz on 02/08/24 at 10:00 AM EST by a video enabled telemedicine application and verified that I am speaking with the correct person using two identifiers.  Location: Patient: Work Provider: Providers address   I discussed the limitations of evaluation and management by telemedicine and the availability of in person appointments. The patient expressed understanding and agreed to proceed.   I discussed the assessment and treatment plan with the patient. The patient was provided an opportunity to ask questions and all were answered. The patient agreed with the plan and demonstrated an understanding of the instructions.   The patient was advised to call back or seek an in-person evaluation if the symptoms worsen or if the condition fails to improve as anticipated.  I provided 49 minutes of non-face-to-face time during this encounter.   [name], LCSW   Session Time: 10-10:49am  Participation Level: Active  Behavioral Response: CasualAlertEuthymic  Type of Therapy: Individual Therapy  Treatment Goals addressed:  Active     BH CCP BIPOLAR DISORDER-MANIA/HYPOMANIA     LTG: Kathryn Munoz will stabilize mood and increase goal-directed behavior as measured by self report  (Progressing)     Start:  01/25/23    Expected End:  02/14/24      05/04/2023: 87% progress. Pt reports she feels like having someone to talk to whom she trusts and who can assist in challenge perspectives has helped her know that she can stand up for herself and handle push back. Pt states, I'm not as afraid of certain situations. Mood is up.       STG: Kathryn Munoz will identify cognitive patterns and beliefs that interfere with therapy (Progressing)     Start:  01/25/23    Expected End:  02/14/24      05/04/2023: 87% progress. Pt states, I have still had some work to do but reaching goals as far of therapy I feel I'm hitting  most pf the points that were my focus for therapy.       Work with Kathryn to track symptoms, triggers, and/or skill use through a mood chart, diary card, or journal     Start:  01/25/23         Work with Kathryn to develop at least a list of 2 triggers for a manic episode     Start:  01/25/23         Work with Kathryn to develop a crisis plan which includes a list of a minimum of 2 triggers for emotional dysregulation with corresponding skills to cope ahead with each trigger     Start:  01/25/23           [name]     LTG: Reduce frequency, intensity, and duration of depression symptoms so that daily functioning is improved (Progressing)     Start:  01/25/23    Expected End:  02/14/24      05/04/2023: Definitely managing them a lot better. There has been something that have happened where I would not have been able to process them at all.SABRASABRAI've been able to mature and process and understand more how and why I was reacting to the situation. I definitely feel I'm accomplishing that part of what was causing me depression at the moment. Overall, I really have a good outlook.        STG: Kathryn Munoz will identify cognitive patterns and beliefs that support depression (Progressing)  Start:  01/25/23    Expected End:  02/14/24      05/04/2023: Reports thought inner child work she has learned to use her voice and assess whom she can trust. Reports improved confidence which assist in challenging negative cognitions.       Work with Kathryn to track symptoms, triggers, and/or skill use through a mood chart, diary card, or journal     Start:  01/25/23         Work with Kathryn to identify the major components of a recent episode of depression: physical symptoms, major thoughts and images, and major behaviors they experienced     Start:  01/25/23         Kathryn Munoz will identify 2 trauma related cognitive distortions     Start:  01/25/23         Kathryn Munoz will identify 3  cognitive distortions they are currently using and write reframing statements to replace them     Start:  01/25/23           Social Interpersonal Effectiveness     LTG: Kathryn Munoz will recognize socially inappropriate behaviors and develop alternative behaviors (Progressing)     Start:  01/25/23    Expected End:  02/14/24         STG: Kathryn Munoz will identify 2 behaviors that engage in social isolation  (Progressing)     Start:  01/25/23    Expected End:  02/14/24      05/04/2023: 90% progress. Notes great improvement. Reports she has had moments where she wanted to cancel but has challenged herself to put herself into uncomfortable situations stating, now I know how to cope and get myself through them.       Observe patient's social engagement     Start:  01/25/23         Educate Kathryn Munoz on appropriate behaviors and boundaries     Start:  01/25/23         Work with Kathryn to identify 2 signs that they are engaging in social isolation     Start:  01/25/23          ProgressTowards Goals: Progressing  Interventions: Assertiveness Training and Supportive  Summary:  Kathryn Munoz is a 48 y.o. female who presents with symptoms of anxiety and depression. Patient identifies symptoms to include uncontrollable worry, negative self affect, and anxious feelings.Pt was oriented times 5. Pt was cooperative and engaged. Pt denies SI/HI/AVH.   The client reported experiencing a tension headache that lasted for four days. She believes that this was related to stress surrounding her anniversary and her desire to connect and communicate effectively with her husband. She reflected on previous sessions and the solutions that were explored, noting that she feels her husband has been avoidant in having assertive conversations related to her feelings.  She discussed the importance of communicating with her family about how fragile she feels during this time of year due to her mother's death. She  reported that she has taken steps to schedule couples therapy.  The session explored her priorities regarding boundaries and finding peace at this stage in her life, as well as patterns within their relationship. We discussed the use of assertive communication to establish a mutual understanding of her needs moving forward.  The client also explored her stressors and considered alternative perspectives to address the root of any miscommunication.  Reports improvement with gratitude this holiday season. Reflected on efforts to make her home more peaceful.  Goal: contact couples therapist next week.    Suicidal/Homicidal: Nowithout intent/plan  Therapist Response:  Cln utilized active and supportive reflection to create an environment for patient to process recent life stressors and symptoms. Clinician assessed for current stressors, symptoms, safety since last session. Reflected on patients progress and realization regarding what she needs to feel supported during this time. In an effort to focus on controllable factors she explored use of assertive communication to express her feelings with others. Continued to address possible solutions to improving connection within personal relationships.   Plan: Return again in 2 weeks.  Diagnosis: Bipolar II disorder in full remission (HCC)  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   Collaboration of Care: AEB psychiatrist can access notes and cln. Will review psychiatrists' notes. Check in with the patient and will see LCSW per availability. Patient agreed with treatment recommendations.   Patient/Guardian was advised Release of Information must be obtained prior to any record release in order to collaborate their care with an outside provider. Patient/Guardian was advised if they have not already done so to contact the registration department to sign all necessary forms in order for us  to release information regarding their care.    Consent: Patient/Guardian gives verbal consent for treatment and assignment of benefits for services provided during this visit. Patient/Guardian expressed understanding and agreed to proceed.   [name], LCSW 02/08/2024

## 2024-02-13 ENCOUNTER — Telehealth: Admitting: Psychiatry

## 2024-02-13 ENCOUNTER — Encounter: Payer: Self-pay | Admitting: Psychiatry

## 2024-02-13 DIAGNOSIS — F5105 Insomnia due to other mental disorder: Secondary | ICD-10-CM | POA: Diagnosis not present

## 2024-02-13 DIAGNOSIS — F3181 Bipolar II disorder: Secondary | ICD-10-CM | POA: Diagnosis not present

## 2024-02-13 DIAGNOSIS — F99 Mental disorder, not otherwise specified: Secondary | ICD-10-CM | POA: Diagnosis not present

## 2024-02-13 DIAGNOSIS — F429 Obsessive-compulsive disorder, unspecified: Secondary | ICD-10-CM | POA: Diagnosis not present

## 2024-02-13 DIAGNOSIS — Z630 Problems in relationship with spouse or partner: Secondary | ICD-10-CM

## 2024-02-13 MED ORDER — CITALOPRAM HYDROBROMIDE 20 MG PO TABS: 20.0000 mg | ORAL_TABLET | Freq: Every day | ORAL | 1 refills | Status: AC

## 2024-02-13 MED ORDER — TRAZODONE HCL 50 MG PO TABS: 25.0000 mg | ORAL_TABLET | Freq: Every evening | ORAL | 0 refills | Status: AC | PRN

## 2024-02-13 NOTE — Progress Notes (Signed)
 Virtual Visit via Video Note  I connected with Kathryn Munoz on 02/13/24 at 11:20 AM EST by a video enabled telemedicine application and verified that I am speaking with the correct person using two identifiers.  Location Provider Location : [facility] Patient Location : Work  Participants: Patient , Provider   I discussed the limitations of evaluation and management by telemedicine and the availability of in person appointments. The patient expressed understanding and agreed to proceed.   I discussed the assessment and treatment plan with the patient. The patient was provided an opportunity to ask questions and all were answered. The patient agreed with the plan and demonstrated an understanding of the instructions.   The patient was advised to call back or seek an in-person evaluation if the symptoms worsen or if the condition fails to improve as anticipated.  BH MD OP Progress Note  02/13/2024 2:01 PM Kathryn Munoz  MRN:  [id number]  Chief Complaint:  Chief Complaint  Patient presents with   Medication Refill   Follow-up   Mood disorders   Discussed the use of AI scribe software for clinical note transcription with the patient, who gave verbal consent to proceed.  History of Present Illness Kathryn Munoz is a 48 year old African-American female, employed, lives in [place], has a history of bipolar disorder, insomnia, hypothyroidism, OCD, gastric bypass surgery (02/2023) was evaluated by telemedicine today.  Over the past several weeks, she has experienced mood swings, which she reports relate in part to holiday-related stress and relationship difficulties. She describes episodes of feeling hyperactive, including increased cleaning and organizing, but notes these do not interfere with her ability to work, maintain daily functioning, or sleep. She connects emotional stress and general sadness to ongoing marital issues, including difficulty connecting and communicating with her spouse,  lack of intimacy for approximately 2 months, and feeling disrespected, which has affected her desire for intimacy. She identifies using coping strategies such as reframing and emotional regulation.  She reports that her sleep has improved and she is able to fall and stay asleep. She currently takes trazodone  for sleep and finds it effective. Approximately 3 weeks ago, she stopped taking Seroquel  and has continued trazodone . Her current regimen includes Lamictal  175 mg daily (combination of 150 mg and 25 mg tablets) and hydroxyzine  as needed for sleep as well as Celexa  20 mg daily.  Denies side effects.  She continues individual therapy with Ms.Perkins and recently discussed relationship issues and coping strategies during a session this week.  She denies any suicidality, homicidality or perceptual disturbances.   Visit Diagnosis:    ICD-10-CM   1. Bipolar II disorder in full remission (HCC)  F31.81    Most recent episode depressed    2. 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  F42.9 citalopram  (CELEXA ) 20 MG tablet    3. Insomnia due to mental condition  F51.05    mood symptoms    4. Partner relational problem  Z63.0 traZODone  (DESYREL ) 50 MG tablet      Past Psychiatric History: I have reviewed past psychiatric history from progress note on 11/08/2017.  Past trials of Celexa .  Suicide attempt at the age of 89 by overdose on pills.  Another attempt at the age of 61, slept through it and did not tell anyone.  Past Medical History:  Past Medical History:  Diagnosis Date   Abnormal mammogram    breast biopsy, [facility] 2014   ADD (attention deficit disorder)    Allergy 2017  Anxiety    Bipolar 2 disorder (HCC)    Constipation    Depression    Dermatitis    GERD (gastroesophageal reflux disease) 2017   Heavy menstrual bleeding    History of gestational diabetes    History of kidney stones 2014   Hypothyroidism    IFG (impaired fasting glucose)    Insulin  resistance     Irregular periods    Joint pain    Multiple food allergies    Dates and nuts   Neuropathy    Obesity    OCD (obsessive compulsive disorder)    Prediabetes    Vitamin D  deficiency disease     Past Surgical History:  Procedure Laterality Date   BREAST BIOPSY Left 2014   us  bx/clip-neg   INTRAUTERINE DEVICE INSERTION  03/11/2012   TRIGGER FINGER RELEASE Right 04/21/2021    Family Psychiatric History: I have reviewed family psychiatric history from progress note on 11/08/2017.  Family History:  Family History  Problem Relation Age of Onset   Diabetes Mother    Pancreatitis Mother    Hypertension Mother    Alcohol abuse Mother    Schizophrenia Mother    Depression Mother    Heart disease Mother    Stroke Mother    Thyroid  disease Mother    Kidney disease Mother    Anxiety disorder Mother    Alcohol abuse Father    Diabetes Sister        pre-diabetic   Obesity Sister    Diabetes Brother    Drug abuse Brother    Stroke Brother    ADD / ADHD Son    Intellectual disability Son    Learning disabilities Son    Eczema Son    Breast cancer Cousin        pat cousin   Anxiety disorder Other    Ovarian cancer Neg Hx     Social History: I have reviewed social history from progress note on 11/08/2017. Social History   Socioeconomic History   Marital status: Married    Spouse name: Alyss Granato   Number of children: 2   Years of education: Not on file   Highest education level: Bachelor's degree (e.g., BA, AB, BS)  Occupational History   Occupation: Char[employer] Fundraiser  Tobacco Use   Smoking status: Never   Smokeless tobacco: Never  Vaping Use   Vaping status: Never Used  Substance and Sexual Activity   Alcohol use: Yes    Comment: Ocassionally   Drug use: No   Sexual activity: Yes    Partners: Male    Birth control/protection: I.U.D., Other-see comments    Comment: Mirena   Other Topics Concern   Not on file  Social History Narrative   Not on file   Social Drivers  of Health   Financial Resource Strain: Low Risk  (11/30/2023)   Overall Financial Resource Strain (CARDIA)    Difficulty of Paying Living Expenses: Not hard at all  Food Insecurity: No Food Insecurity (11/30/2023)   Hunger Vital Sign    Worried About Running Out of Food in the Last Year: Never true    Ran Out of Food in the Last Year: Never true  Transportation Needs: No Transportation Needs (11/30/2023)   PRAPARE - Administrator, Civil Service (Medical): No    Lack of Transportation (Non-Medical): No  Physical Activity: Inactive (11/30/2023)   Exercise Vital Sign    Days of Exercise per Week: 0 days    Minutes  of Exercise per Session: 0 min  Stress: Stress Concern Present (11/30/2023)   [employer] of Occupational Health - Occupational Stress Questionnaire    Feeling of Stress: Very much  Social Connections: Moderately Integrated (11/30/2023)   Social Connection and Isolation Panel    Frequency of Communication with Friends and Family: More than three times a week    Frequency of Social Gatherings with Friends and Family: More than three times a week    Attends Religious Services: More than 4 times per year    Active Member of [employer] or Organizations: No    Attends Banker Meetings: Never    Marital Status: Married    Allergies:  Allergies  Allergen Reactions   Date Seed Extract  [Zizyphus Jujuba] Anaphylaxis, Cough, Itching and Swelling   Other Swelling    Metabolic Disorder Labs: Lab Results  Component Value Date   HGBA1C 5.7 (H) 11/28/2022   MPG 117 11/28/2022   MPG 126 04/13/2022   Lab Results  Component Value Date   PROLACTIN 10.9 11/08/2022   Lab Results  Component Value Date   CHOL 188 11/30/2023   TRIG 72 11/30/2023   HDL 53 11/30/2023   CHOLHDL 3.5 11/30/2023   VLDL 14 10/16/2016   LDLCALC 119 (H) 11/30/2023   LDLCALC 152 (H) 11/28/2022   Lab Results  Component Value Date   TSH 1.40 11/30/2023   TSH 3.71 05/21/2023     Therapeutic Level Labs: No results found for: LITHIUM No results found for: VALPROATE No results found for: CBMZ  Current Medications: Current Outpatient Medications  Medication Sig Dispense Refill   Biotin 5000 MCG CAPS Take by mouth daily.     Calcium-Vitamin D -Vitamin K (VIACTIV CALCIUM PLUS D) 650-12.5-40 MG-MCG-MCG CHEW Chew by mouth.     citalopram  (CELEXA ) 20 MG tablet Take 1 tablet (20 mg total) by mouth daily. 90 tablet 1   EPINEPHrine  0.3 mg/0.3 mL IJ SOAJ injection INJECT 0.3 MLS INTO THE MUSCLE ONCE FOR 1 DOSE. FOR LIFE-THREATENING ALLERGIC REACTION / ANAPHYLAXIS  1   hydrOXYzine  (VISTARIL ) 25 MG capsule Take 25-75 mg by mouth at bedtime as needed. (Patient not taking: Reported on 01/31/2024)     lamoTRIgine  (LAMICTAL ) 150 MG tablet TAKE 1 TABLET (150 MG TOTAL) BY MOUTH DAILY. TAKE ALONG WITH 25 MG DAILY 90 tablet 1   lamoTRIgine  (LAMICTAL ) 25 MG tablet TAKE 1 TABLET (25 MG TOTAL) BY MOUTH DAILY. TAKE ALONG WITH 150 MG DAILY 90 tablet 1   levonorgestrel  (MIRENA , 52 MG,) 20 MCG/DAY IUD Frequency:UNKNOWN   Dosage:0.0     Instructions:  Note:Dose: UNKNOWN (Patient not taking: Reported on 01/31/2024)     levothyroxine  (SYNTHROID ) 100 MCG tablet Take 1 tablet (100 mcg total) by mouth daily before breakfast. 90 tablet 1   linaclotide  (LINZESS ) 145 MCG CAPS capsule TAKE 1 CAPSULE BY MOUTH EVERY DAY BEFORE BREAKFAST 90 capsule 3   meclizine  (ANTIVERT ) 12.5 MG tablet Take 1 tablet (12.5 mg total) by mouth 3 (three) times daily as needed for dizziness. 30 tablet 0   metFORMIN  (GLUCOPHAGE ) 500 MG tablet Take 500 mg by mouth.     Multiple Vitamin (MULTIVITAMIN) tablet Take 1 tablet by mouth daily.     ondansetron  (ZOFRAN -ODT) 4 MG disintegrating tablet Take 1 tablet (4 mg total) by mouth every 8 (eight) hours as needed for nausea or vomiting. 20 tablet 0   Rimegepant Sulfate (NURTEC) 75 MG TBDP Take 1 tablet (75 mg total) by mouth daily as needed (migraine). 30  tablet 2    SUMAtriptan  (IMITREX ) 50 MG tablet Take 1-2 tablets (50-100 mg total) by mouth every 2 (two) hours as needed for migraine. May repeat in 2 hours if headache persists or recurs. Do not exceed 200 mg in 24 hour period. 10 tablet 1   traZODone  (DESYREL ) 50 MG tablet Take 0.5-1 tablets (25-50 mg total) by mouth at bedtime as needed for sleep. 90 tablet 0   Current Facility-Administered Medications  Medication Dose Route Frequency Provider Last Rate Last Admin   promethazine  (PHENERGAN ) injection 25 mg  25 mg Intramuscular Q6H PRN Bernardo Fend, DO   25 mg at 01/31/24 1437     Musculoskeletal: Strength & Muscle Tone: UTA Gait & Station: Seated Patient leans: N/A  Psychiatric Specialty Exam: Review of Systems  Psychiatric/Behavioral: Negative.         Mood swings -situational    There were no vitals taken for this visit.There is no height or weight on file to calculate BMI.  General Appearance: Casual  Eye Contact:  Fair  Speech:  Clear and Coherent  Volume:  Normal  Mood:  mood swings -mostly situational  Affect:  Congruent  Thought Process:  Goal Directed and Descriptions of Associations: Intact  Orientation:  Full (Time, Place, and Person)  Thought Content: Logical   Suicidal Thoughts:  No  Homicidal Thoughts:  No  Memory:  Immediate;   Fair Recent;   Fair Remote;   Fair  Judgement:  Fair  Insight:  Fair  Psychomotor Activity:  Normal  Concentration:  Concentration: Fair and Attention Span: Fair  Recall:  [name] of Knowledge: Fair  Language: Fair  Akathisia:  No  Handed:  Right  AIMS (if indicated): not done  Assets:  Communication Skills Desire for Improvement Housing Social Support  ADL's:  Intact  Cognition: WNL  Sleep:  Good   Screenings: AIMS    Flowsheet Row Office Visit from 09/20/2023 in [facility] Office Visit from 11/08/2022 in [facility] Video Visit from 10/24/2021 in  [facility] Video Visit from 07/27/2021 in [facility] Video Visit from 10/28/2020 in [facility]  AIMS Total Score 0 0 0 0 0   GAD-7    Flowsheet Row Office Visit from 09/20/2023 in [facility] Counselor from 01/18/2023 in [facility] Office Visit from 11/28/2022 in [facility] Office Visit from 11/08/2022 in [facility] Office Visit from 04/13/2022 in [facility]  Total GAD-7 Score 0 17 21 18  0   PHQ2-9    Flowsheet Row Office Visit from 01/31/2024 in [facility] Video Visit from 12/21/2023 in [facility] Office Visit from 12/14/2023 in [facility] Office Visit from 11/30/2023 in [facility] Office Visit from 09/20/2023 in [facility]  PHQ-2 Total Score 0 0 0 0 0  PHQ-9 Total Score -- -- 0 -- --   Flowsheet Row Video Visit from 02/13/2024 in [facility] Video Visit from 12/21/2023 in [facility] Office Visit from 09/20/2023 in [facility]  C-SSRS RISK CATEGORY Moderate Risk Moderate Risk No Risk     Assessment and Plan: Kathryn Munoz is a 48 year old African-American  female who presented for a follow-up appointment, discussed assessment and plan as noted below.  1. Bipolar II disorder in full remission (HCC) most recent episode depressed Currently reports may have noticed being hyperactive recently although it does not affect day-to-day functioning and likely also situational with the holidays.  Denies any classical  hypomanic/manic or depression symptoms. Continue Lamictal  175 mg daily Continue Trazodone  50 mg at bedtime Discontinue Seroquel .  Tapered off.  2. 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-stable Currently reports obsessions and compulsive symptoms is well-managed. Continue Celexa  20 mg daily Continue psychotherapy sessions with Ms. Perkins  3. Insomnia due to mental condition-improving Sleep is overall good. Continue Trazodone  50 mg at bedtime. Continue Hydroxyzine  25 to 75 mg at bedtime as needed Continue sleep hygiene techniques  4. Partner relational problem-chronic Currently following up with therapist, agrees to work on same.  Follow-up Follow-up in clinic in 2 months or sooner if needed.    Collaboration of Care: Collaboration of Care: Referral or follow-up with counselor/therapist AEB encouraged to continue psychotherapy sessions I have reviewed notes per Ms. Evalene Perkins-02/08/2024.  Patient/Guardian was advised Release of Information must be obtained prior to any record release in order to collaborate their care with an outside provider. Patient/Guardian was advised if they have not already done so to contact the registration department to sign all necessary forms in order for us  to release information regarding their care.   Consent: Patient/Guardian gives verbal consent for treatment and assignment of benefits for services provided during this visit. Patient/Guardian expressed understanding and agreed to proceed.   This note was generated in part or whole with voice recognition software. Voice recognition is usually quite accurate but there are transcription errors that can and very often do occur. I apologize for any typographical errors that were not detected and corrected.    Julee Stoll, MD 02/13/2024, 2:01 PM

## 2024-02-22 ENCOUNTER — Ambulatory Visit (INDEPENDENT_AMBULATORY_CARE_PROVIDER_SITE_OTHER): Admitting: Licensed Clinical Social Worker

## 2024-02-22 DIAGNOSIS — F3181 Bipolar II disorder: Secondary | ICD-10-CM

## 2024-02-22 DIAGNOSIS — F429 Obsessive-compulsive disorder, unspecified: Secondary | ICD-10-CM | POA: Diagnosis not present

## 2024-02-22 NOTE — Progress Notes (Signed)
 THERAPIST PROGRESS NOTE  Virtual Visit via Video Note  I connected with Kathryn Munoz on 02/22/24 at 9:02am by a video enabled telemedicine application and verified that I am speaking with the correct person using two identifiers.  Location: Patient: Work Address Provider: Providers Address   I discussed the limitations of evaluation and management by telemedicine and the availability of in person appointments. The patient expressed understanding and agreed to proceed.   I discussed the assessment and treatment plan with the patient. The patient was provided an opportunity to ask questions and all were answered. The patient agreed with the plan and demonstrated an understanding of the instructions.   The patient was advised to call back or seek an in-person evaluation if the symptoms worsen or if the condition fails to improve as anticipated.  I provided 58 minutes of non-face-to-face time during this encounter.   Kathryn KATHEE Husband, LCSW   Session Time: 9:02am--10:00am  Participation Level: Active  Behavioral Response: CasualAlertEuthymic  Type of Therapy: Individual Therapy  Treatment Goals addressed:  Active     BH CCP BIPOLAR DISORDER-MANIA/HYPOMANIA     LTG: Kathryn Munoz will stabilize mood and increase goal-directed behavior as measured by self report  (Progressing)     Start:  01/25/23    Expected End:  05/22/24      05/04/2023: 87% progress. Pt reports she feels like having someone to talk to whom she trusts and who can assist in challenge perspectives has helped her know that she can stand up for herself and handle push back. Pt states, I'm not as afraid of certain situations. Mood is up.     Goal Note     02/22/24: Patient denies manic or depressive episodes in months.          STG: Kathryn Munoz will identify cognitive patterns and beliefs that interfere with therapy (Progressing)     Start:  01/25/23    Expected End:  05/22/24      05/04/2023: 87% progress. Pt  states, I have still had some work to do but reaching goals as far of therapy I feel I'm hitting most pf the points that were my focus for therapy.       Work with Kathryn to track symptoms, triggers, and/or skill use through a mood chart, diary card, or journal     Start:  01/25/23         Work with Kathryn to develop at least a list of 2 triggers for a manic episode     Start:  01/25/23         Work with Kathryn to develop a crisis plan which includes a list of a minimum of 2 triggers for emotional dysregulation with corresponding skills to cope ahead with each trigger     Start:  01/25/23           [name]     LTG: Reduce frequency, intensity, and duration of depression symptoms so that daily functioning is improved (Progressing)     Start:  01/25/23    Expected End:  05/22/24      05/04/2023: Definitely managing them a lot better. There has been something that have happened where I would not have been able to process them at all.SABRASABRAI've been able to mature and process and understand more how and why I was reacting to the situation. I definitely feel I'm accomplishing that part of what was causing me depression at the moment. Overall, I really have a good outlook.  STG: Kathryn Munoz will identify cognitive patterns and beliefs that support depression (Progressing)     Start:  01/25/23    Expected End:  05/22/24      05/04/2023: Reports thought inner child work she has learned to use her voice and assess whom she can trust. Reports improved confidence which assist in challenging negative cognitions.     Goal Note     02/22/23: Patient reports she is aware of negative belief systems and is working to challenge and reframe.          Work with Kathryn to track symptoms, triggers, and/or skill use through a mood chart, diary card, or journal     Start:  01/25/23         Work with Kathryn to identify the major components of a recent episode of depression: physical  symptoms, major thoughts and images, and major behaviors they experienced     Start:  01/25/23         Kathryn Munoz will identify 2 trauma related cognitive distortions     Start:  01/25/23         Kathryn Munoz will identify 3 cognitive distortions they are currently using and write reframing statements to replace them     Start:  01/25/23           Social Interpersonal Effectiveness     LTG: Kathryn Munoz will recognize socially inappropriate behaviors and develop alternative behaviors (Progressing)     Start:  01/25/23    Expected End:  05/22/24       Goal Note     02/22/24: Patient is aware of patterns or avoidance and frequent encounters with inner child experience that interfere with social engagement.          STG: Kathryn Munoz will identify 2 behaviors that engage in social isolation  (Progressing)     Start:  01/25/23    Expected End:  05/22/24      05/04/2023: 90% progress. Notes great improvement. Reports she has had moments where she wanted to cancel but has challenged herself to put herself into uncomfortable situations stating, now I know how to cope and get myself through them.       Observe patient's social engagement     Start:  01/25/23         Educate [name] on appropriate behaviors and boundaries     Start:  01/25/23         Work with Kathryn to identify 2 signs that they are engaging in social isolation     Start:  01/25/23            ProgressTowards Goals: Progressing  Interventions: CBT, Supportive, and Reframing  Summary: Kathryn Munoz is a 48 y.o. female who presents with symptoms of anxiety and depression. Patient identifies symptoms to include uncontrollable worry, negative self affect, and anxious feelings.Pt was oriented times 5. Pt was cooperative and engaged. Pt denies SI/HI/AVH.   She reports that she has been doing really well and enjoyed the holiday season. She shared that she had people over for the first time in three years due to the  progress she has made in cleaning and organizing her home space. She was able to be intentional and present during their visit.  However, she also mentioned feeling overworked with tasks at her job. She has explored solutions to improve her task paralysis at work but has been overthinking suggestions for help. She admitted to avoiding tasks because she feels she won't be successful in  completing them. 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  Additionally, she explored her triggers related to work and the practices she can engage in to address her emotional liability. She has made improvements in her assertiveness with management at work.   Suicidal/Homicidal: Nowithout intent/plan  Therapist Response: Cln utilized active and supportive reflection to create an environment for patient to process recent life stressors and symptoms. Clinician assessed for current stressors, symptoms, safety since last session. Utilized CBT to work the patient on refocusing on controllable factors, challenging negative thought patterns with personalization, and reframe negative thoughts.   Plan: Return again in 2 weeks.  Diagnosis: Bipolar II disorder in full remission (HCC)  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   Collaboration of Care: Psychiatrist AEB AEB psychiatrist can access notes and cln. Will review psychiatrists' notes. Check in with the patient and will see LCSW per availability. Patient agreed with treatment recommendations.   Patient/Guardian was advised Release of Information must be obtained prior to any record release in order to collaborate their care with an outside provider. Patient/Guardian was advised if they have not already done so to contact the registration department to sign all necessary forms in order for us  to release information regarding their care.   Consent: Patient/Guardian gives verbal consent for  treatment and assignment of benefits for services provided during this visit. Patient/Guardian expressed understanding and agreed to proceed.   Kathryn KATHEE Husband, LCSW 02/22/2024

## 2024-02-27 ENCOUNTER — Inpatient Hospital Stay: Admission: RE | Admit: 2024-02-27 | Discharge: 2024-02-27 | Attending: Family Medicine

## 2024-02-27 DIAGNOSIS — Z1231 Encounter for screening mammogram for malignant neoplasm of breast: Secondary | ICD-10-CM | POA: Diagnosis present

## 2024-03-04 ENCOUNTER — Ambulatory Visit: Admitting: Internal Medicine

## 2024-03-04 ENCOUNTER — Encounter: Payer: Self-pay | Admitting: Internal Medicine

## 2024-03-04 VITALS — BP 124/82 | HR 84 | Temp 98.0°F | Resp 18 | Ht 65.0 in | Wt 202.3 lb

## 2024-03-04 DIAGNOSIS — Z9884 Bariatric surgery status: Secondary | ICD-10-CM

## 2024-03-04 DIAGNOSIS — R790 Abnormal level of blood mineral: Secondary | ICD-10-CM

## 2024-03-04 DIAGNOSIS — G43E11 Chronic migraine with aura, intractable, with status migrainosus: Secondary | ICD-10-CM

## 2024-03-04 DIAGNOSIS — E559 Vitamin D deficiency, unspecified: Secondary | ICD-10-CM

## 2024-03-04 MED ORDER — SUMATRIPTAN SUCCINATE 50 MG PO TABS: 50.0000 mg | ORAL_TABLET | ORAL | 1 refills | Status: AC | PRN

## 2024-03-04 NOTE — Progress Notes (Signed)
 Established Office Visit  Subjective:     Patient ID: Kathryn Munoz, female    DOB: [birth year], 48 y.o.   MRN: [id number]  Chief Complaint  Patient presents with   Medical Management of Chronic Issues    Ins. Denied nurtec   Labs Only    Review abnormal labs from [facility]    HPI Patient is in today for follow up.   Discussed the use of AI scribe software for clinical note transcription with the patient, who gave verbal consent to proceed.  History of Present Illness  Kathryn Munoz is a 48 year old female with migraines who presents for follow-up on headache management.  She has had two headaches since her last visit, both shorter than prior episodes. Imitrex  relieved the first headache and Tylenol relieved the second. She has not started Nurtec due to insurance issues. She was prescribed topiramate by her gastric bypass nurse for migraine prevention and weight loss but has not started it.  Her current medications include Imitrex  50 to 100 mg as needed up to 200 mg per day, Celexa , trazodone , and Lamictal .  She has gastric bypass and takes a bariatric vitamin with vitamin D . Recent labs showed low ferritin and slightly low vitamin D . Recent headaches have not been associated with light or sound sensitivity.  Health Maintenance: -Blood work UTD and reviewed -Mammogram 12/25 Birads-1 -Pap 8/22, repeat in 5 years -Colonoscopy 2024, repeat in 10 years   Review of Systems  Neurological:  Negative for headaches.        Objective:    BP 124/82   Pulse 84   Temp 98 F (36.7 C)   Resp 18   Ht 5' 5 (1.651 m)   Wt 202 lb 4.8 oz (91.8 kg)   SpO2 98%   BMI 33.66 kg/m    Physical Exam Constitutional:      Appearance: Normal appearance.  HENT:     Head: Normocephalic and atraumatic.  Eyes:     Conjunctiva/sclera: Conjunctivae normal.  Cardiovascular:     Rate and Rhythm: Normal rate and regular rhythm.  Pulmonary:     Effort: Pulmonary effort is normal.      Breath sounds: Normal breath sounds.  Skin:    General: Skin is warm and dry.  Neurological:     General: No focal deficit present.     Mental Status: She is alert. Mental status is at baseline.  Psychiatric:        Mood and Affect: Mood normal.        Behavior: Behavior normal.     No results found for any visits on 03/04/24.      Assessment & Plan:   Assessment & Plan  Chronic migraine with aura Experiencing two migraines this month. Nurtec not covered. Topiramate prescribed but not started. Imitrex  effective for aborting migraines. Discussed tension headaches potentially progressing to migraines. Topiramate weak for prevention but aids weight loss. Adequate assessment requires 2-3 months. - Continue Imitrex  up to 200 mg in 24 hours as needed. - Start topiramate 25 mg daily, increase to 50 mg based on tolerability. - Contact pharmacy regarding Nurtec coverage. - Refilled Imitrex  with 30 tablets and one refill. - Added topiramate to medication list.  Iron deficiency Low ferritin at 12.9, not affecting hemoglobin. Discussed GI side effects of iron supplementation. - Start over-the-counter gentle or slow-release iron every other day. - Take iron with vitamin C, avoid dairy.  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 Currently taking Bariatric Pal supplement. - Check current vitamin D  intake in Bariatric Pal. - Increase vitamin D  to 5000 IU daily if current intake is lower.  General Health Maintenance Mammogram normal, BI-RADS 1. Pap smear negative for HPV, next due 2027. Colonoscopy normal, next due in 10 years. - Continue routine health maintenance screenings as per guidelines.  - SUMAtriptan  (IMITREX ) 50 MG tablet; Take 1-2 tablets (50-100 mg total) by mouth every 2 (two) hours as needed for migraine. May repeat in 2 hours if headache persists or recurs. Do not exceed 200 mg in 24 hour period.  Dispense: 30 tablet; Refill:  1 - topiramate (TOPAMAX) 25 MG tablet; Take 1 tablet (25 mg total) by mouth 2 (two) times daily.   Return in about 6 months (around 09/02/2024).  Sharyle Fischer, DO

## 2024-03-14 ENCOUNTER — Ambulatory Visit: Admitting: Licensed Clinical Social Worker

## 2024-03-24 ENCOUNTER — Ambulatory Visit: Admitting: Licensed Clinical Social Worker

## 2024-03-24 DIAGNOSIS — F3181 Bipolar II disorder: Secondary | ICD-10-CM | POA: Diagnosis not present

## 2024-03-24 DIAGNOSIS — F429 Obsessive-compulsive disorder, unspecified: Secondary | ICD-10-CM | POA: Diagnosis not present

## 2024-03-24 NOTE — Progress Notes (Signed)
 "  THERAPIST PROGRESS NOTE  Virtual Visit via Video Note  I connected with Kathryn Munoz on 03/24/2024 at 2:03pm by a video enabled telemedicine application and verified that I am speaking with the correct person using two identifiers.  Location: Patient: Work [employer]: Providers address   I discussed the limitations of evaluation and management by telemedicine and the availability of in person appointments. The patient expressed understanding and agreed to proceed.  I discussed the assessment and treatment plan with the patient. The patient was provided an opportunity to ask questions and all were answered. The patient agreed with the plan and demonstrated an understanding of the instructions.   The patient was advised to call back or seek an in-person evaluation if the symptoms worsen or if the condition fails to improve as anticipated.  I provided 49 minutes of non-face-to-face time during this encounter.   Kathryn KATHEE Husband, LCSW   Session Time: 2:03 pm- 2:52pm  Participation Level: Active  Behavioral Response: CasualAlertHopeful   Type of Therapy: Individual Therapy  Treatment Goals addressed:  Active     BH CCP BIPOLAR DISORDER-MANIA/HYPOMANIA     LTG: Kathryn Munoz will stabilize mood and increase goal-directed behavior as measured by self report  (Progressing)     Start:  01/25/23    Expected End:  05/22/24      05/04/2023: 87% progress. Pt reports she feels like having someone to talk to whom she trusts and who can assist in challenge perspectives has helped her know that she can stand up for herself and handle push back. Pt states, I'm not as afraid of certain situations. Mood is up.     Goal Note     02/22/24: Patient denies manic or depressive episodes in months.          STG: Kathryn Munoz will identify cognitive patterns and beliefs that interfere with therapy (Progressing)     Start:  01/25/23    Expected End:  05/22/24      05/04/2023: 87%  progress. Pt states, I have still had some work to do but reaching goals as far of therapy I feel I'm hitting most pf the points that were my focus for therapy.       Work with Kathryn to track symptoms, triggers, and/or skill use through a mood chart, diary card, or journal     Start:  01/25/23         Work with Kathryn to develop at least a list of 2 triggers for a manic episode     Start:  01/25/23         Work with Kathryn to develop a crisis plan which includes a list of a minimum of 2 triggers for emotional dysregulation with corresponding skills to cope ahead with each trigger     Start:  01/25/23           [name]     LTG: Reduce frequency, intensity, and duration of depression symptoms so that daily functioning is improved (Progressing)     Start:  01/25/23    Expected End:  05/22/24      05/04/2023: Definitely managing them a lot better. There has been something that have happened where I would not have been able to process them at all.SABRASABRAI've been able to mature and process and understand more how and why I was reacting to the situation. I definitely feel I'm accomplishing that part of what was causing me depression at the moment. Overall, I  really have a good outlook.        STG: Kathryn Munoz will identify cognitive patterns and beliefs that support depression (Progressing)     Start:  01/25/23    Expected End:  05/22/24      05/04/2023: Reports thought inner child work she has learned to use her voice and assess whom she can trust. Reports improved confidence which assist in challenging negative cognitions.     Goal Note     02/22/23: Patient reports she is aware of negative belief systems and is working to challenge and reframe.          Work with Kathryn to track symptoms, triggers, and/or skill use through a mood chart, diary card, or journal     Start:  01/25/23         Work with Kathryn to identify the major components of a recent episode of depression:  physical symptoms, major thoughts and images, and major behaviors they experienced     Start:  01/25/23         Kathryn Munoz will identify 2 trauma related cognitive distortions     Start:  01/25/23         Kathryn Munoz will identify 3 cognitive distortions they are currently using and write reframing statements to replace them     Start:  01/25/23           Social Interpersonal Effectiveness     LTG: Kathryn Munoz will recognize socially inappropriate behaviors and develop alternative behaviors (Progressing)     Start:  01/25/23    Expected End:  05/22/24       Goal Note     02/22/24: Patient is aware of patterns or avoidance and frequent encounters with inner child experience that interfere with social engagement.          STG: Kathryn Munoz will identify 2 behaviors that engage in social isolation  (Progressing)     Start:  01/25/23    Expected End:  05/22/24      05/04/2023: 90% progress. Notes great improvement. Reports she has had moments where she wanted to cancel but has challenged herself to put herself into uncomfortable situations stating, now I know how to cope and get myself through them.       Observe patient's social engagement     Start:  01/25/23         Educate Kathryn Munoz on appropriate behaviors and boundaries     Start:  01/25/23         Work with Kathryn to identify 2 signs that they are engaging in social isolation     Start:  01/25/23          ProgressTowards Goals: Progressing  Interventions: Solution Focused, Strength-based, and Supportive  Summary: Kathryn Munoz is a 49 y.o. female who presents with symptoms of anxiety and depression. Patient identifies symptoms to include uncontrollable worry, negative self affect, and anxious feelings.Pt was oriented times 5. Pt was cooperative and engaged. Pt denies SI/HI/AVH.   During the holiday season, she reflected on her improved level of acceptance and her commitment to prioritizing family in 2025. In 2026, she  identified a desire to practice moments of giving back to both others and herself.  She explored ways to enhance the connection within her marriage, focusing on investing quality time into their relationship.  Additionally, she discussed the stressors at work and how she navigated self-care during the holidays. She also addressed the challenges of returning to work and reflected on her  use of self-care and self-encouragement to manage interactions with her colleagues.  Suicidal/Homicidal: Nowithout intent/plan  Therapist Response: Cln utilized active and supportive reflection to create an environment for patient to process recent life stressors and symptoms. Clinician assessed for current stressors, symptoms, safety since last session. Reflected on solutions for the patient to incorporate action steps into her routine that assist in managing her improved MH and physical wellbeing.   Plan: Return again in 2 weeks.  Diagnosis: Bipolar II disorder in full remission (HCC)  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   Collaboration of Care: AEB psychiatrist can access notes and cln. Will review psychiatrists' notes. Check in with the patient and will see LCSW per availability. Patient agreed with treatment recommendations.   Patient/Guardian was advised Release of Information must be obtained prior to any record release in order to collaborate their care with an outside provider. Patient/Guardian was advised if they have not already done so to contact the registration department to sign all necessary forms in order for us  to release information regarding their care.   Consent: Patient/Guardian gives verbal consent for treatment and assignment of benefits for services provided during this visit. Patient/Guardian expressed understanding and agreed to proceed.   Kathryn KATHEE Husband, LCSW 03/24/2024  "

## 2024-03-28 ENCOUNTER — Ambulatory Visit: Admitting: Licensed Clinical Social Worker

## 2024-04-17 ENCOUNTER — Encounter: Payer: Self-pay | Admitting: Psychiatry

## 2024-04-17 ENCOUNTER — Ambulatory Visit: Admitting: Psychiatry

## 2024-04-17 ENCOUNTER — Other Ambulatory Visit: Payer: Self-pay

## 2024-04-17 VITALS — BP 118/80 | HR 72 | Temp 97.8°F | Ht 64.0 in | Wt 202.6 lb

## 2024-04-17 DIAGNOSIS — F429 Obsessive-compulsive disorder, unspecified: Secondary | ICD-10-CM | POA: Diagnosis not present

## 2024-04-17 DIAGNOSIS — Z630 Problems in relationship with spouse or partner: Secondary | ICD-10-CM | POA: Diagnosis not present

## 2024-04-17 DIAGNOSIS — F3181 Bipolar II disorder: Secondary | ICD-10-CM | POA: Diagnosis not present

## 2024-04-17 DIAGNOSIS — F5105 Insomnia due to other mental disorder: Secondary | ICD-10-CM

## 2024-04-17 NOTE — Progress Notes (Signed)
 BH MD  OP Progress Note  04/17/2024 3:53 PM Kathryn Munoz  MRN:  [id number]  Chief Complaint:  Chief Complaint  Patient presents with   Follow-up   mood swings   ocd   Medication Refill   Discussed the use of AI scribe software for clinical note transcription with the patient, who gave verbal consent to proceed.  History of Present Illness Kathryn Munoz is a 49 year old African-American female, employed, lives in [place], has a history of bipolar disorder, insomnia, hypothyroidism, OCD, gastric bypass surgery ( 02/2023) was evaluated in office today for a follow-up appointment.  Mood symptoms continue to improve, and she describes better coping with work-related stress, which she connects to ongoing therapy. She finds therapy beneficial for stress management and notes improved communication with her husband, sharing that she is beginning couples therapy. She attends individual therapy sessions with Ms. Perkins twice a month, and she finds these helpful.  She denies experiencing suicidal thoughts.  She denies any homicidality or perceptual disturbances.  She reports uninterrupted sleep throughout the night. She does not report any current obsessions or compulsive symptoms that interfere with functioning, though she occasionally rechecks things without significant impact.  She reports that previous mood swings, which she associated with holiday-related stress and relationship challenges, have mostly resolved. She continues to use coping skills learned in therapy to manage stressors.  Her current medication regimen includes Lamictal , Celexa , trazodone  as needed, and hydroxyzine  as needed. She recently started topiramate for migraines and weight loss, and sumatriptan  for acute migraine episodes.   She currently works at the [employer] in [place], working from home 1 day per week and commuting over an hour on other workdays.      Visit Diagnosis:    ICD-10-CM   1. Bipolar II disorder in  full remission (HCC)  F31.81    Most recent episode depressed    2. 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  F42.9     3. Insomnia due to mental condition  F51.05    mood symptoms    4. Partner relational problem  Z63.0       Past Psychiatric History: I have reviewed past psychiatric history from progress note on 11/08/2017.  Past trials of Celexa .  Suicide attempt at the age of 18 by overdose on pills.  Another attempt at the age of 66, slept through it and did not tell anyone.  Past Medical History:  Past Medical History:  Diagnosis Date   Abnormal mammogram    breast biopsy, [facility] 2014   ADD (attention deficit disorder)    Allergy 2017   Anxiety    Bipolar 2 disorder (HCC)    Constipation    Depression    Dermatitis    GERD (gastroesophageal reflux disease) 2017   Heavy menstrual bleeding    History of gestational diabetes    History of kidney stones 2014   Hypothyroidism    IFG (impaired fasting glucose)    Insulin  resistance    Irregular periods    Joint pain    Multiple food allergies    Dates and nuts   Neuropathy    Obesity    OCD (obsessive compulsive disorder)    Prediabetes    Vitamin D  deficiency disease     Past Surgical History:  Procedure Laterality Date   BREAST BIOPSY Left 2014   us  bx/clip-neg   INTRAUTERINE DEVICE INSERTION  03/11/2012   TRIGGER FINGER RELEASE Right 04/21/2021    Family Psychiatric  History: I have reviewed family psychiatric history from progress note on 11/08/2017.  Family History:  Family History  Problem Relation Age of Onset   Diabetes Mother    Pancreatitis Mother    Hypertension Mother    Alcohol abuse Mother    Schizophrenia Mother    Depression Mother    Heart disease Mother    Stroke Mother    Thyroid  disease Mother    Kidney disease Mother    Anxiety disorder Mother    Alcohol abuse Father    Diabetes Sister        pre-diabetic   Obesity Sister    Diabetes Brother    Drug abuse Brother     Stroke Brother    ADD / ADHD Son    Intellectual disability Son    Learning disabilities Son    Eczema Son    Breast cancer Cousin        pat cousin   Anxiety disorder Other    Ovarian cancer Neg Hx     Social History: I have reviewed social history from progress note on 11/08/2017. Social History   Socioeconomic History   Marital status: Married    Spouse name: Addisen Chappelle   Number of children: 2   Years of education: Not on file   Highest education level: Bachelor's degree (e.g., BA, AB, BS)  Occupational History   Occupation: Charity Fundraiser  Tobacco Use   Smoking status: Never   Smokeless tobacco: Never  Vaping Use   Vaping status: Never Used  Substance and Sexual Activity   Alcohol use: Yes    Comment: Ocassionally   Drug use: No   Sexual activity: Yes    Partners: Male    Birth control/protection: I.U.D., Other-see comments    Comment: Mirena   Other Topics Concern   Not on file  Social History Narrative   Not on file   Social Drivers of Health   Tobacco Use: Low Risk (04/17/2024)   Patient History    Smoking Tobacco Use: Never    Smokeless Tobacco Use: Never    Passive Exposure: Not on file  Financial Resource Strain: Low Risk (11/30/2023)   Overall Financial Resource Strain (CARDIA)    Difficulty of Paying Living Expenses: Not hard at all  Food Insecurity: No Food Insecurity (11/30/2023)   Epic    Worried About Programme Researcher, Broadcasting/film/video in the Last Year: Never true    Ran Out of Food in the Last Year: Never true  Transportation Needs: No Transportation Needs (11/30/2023)   Epic    Lack of Transportation (Medical): No    Lack of Transportation (Non-Medical): No  Physical Activity: Inactive (11/30/2023)   Exercise Vital Sign    Days of Exercise per Week: 0 days    Minutes of Exercise per Session: 0 min  Stress: Stress Concern Present (11/30/2023)   [employer] of Occupational Health - Occupational Stress Questionnaire    Feeling of Stress: Very much  Social  Connections: Moderately Integrated (11/30/2023)   Social Connection and Isolation Panel    Frequency of Communication with Friends and Family: More than three times a week    Frequency of Social Gatherings with Friends and Family: More than three times a week    Attends Religious Services: More than 4 times per year    Active Member of Clubs or Organizations: No    Attends Banker Meetings: Never    Marital Status: Married  Depression (PHQ2-9): Low Risk (04/17/2024)   Depression (  PHQ2-9)    PHQ-2 Score: 0  Alcohol Screen: Low Risk (11/30/2023)   Alcohol Screen    Last Alcohol Screening Score (AUDIT): 0  Housing: Unknown (11/30/2023)   Epic    Unable to Pay for Housing in the Last Year: No    Number of Times Moved in the Last Year: Not on file    Homeless in the Last Year: No  Utilities: Not At Risk (11/30/2023)   Epic    Threatened with loss of utilities: No  Health Literacy: Adequate Health Literacy (11/30/2023)   B1300 Health Literacy    Frequency of need for help with medical instructions: Never    Allergies: Allergies[1]  Metabolic Disorder Labs: Lab Results  Component Value Date   HGBA1C 5.7 (H) 11/28/2022   MPG 117 11/28/2022   MPG 126 04/13/2022   Lab Results  Component Value Date   PROLACTIN 10.9 11/08/2022   Lab Results  Component Value Date   CHOL 188 11/30/2023   TRIG 72 11/30/2023   HDL 53 11/30/2023   CHOLHDL 3.5 11/30/2023   VLDL 14 10/16/2016   LDLCALC 119 (H) 11/30/2023   LDLCALC 152 (H) 11/28/2022   Lab Results  Component Value Date   TSH 1.40 11/30/2023   TSH 3.71 05/21/2023    Therapeutic Level Labs: No results found for: LITHIUM No results found for: VALPROATE No results found for: CBMZ  Current Medications: Current Outpatient Medications  Medication Sig Dispense Refill   Biotin 5000 MCG CAPS Take by mouth daily.     Calcium-Vitamin D -Vitamin K (VIACTIV CALCIUM PLUS D) 650-12.5-40 MG-MCG-MCG CHEW Chew by mouth.      citalopram  (CELEXA ) 20 MG tablet Take 1 tablet (20 mg total) by mouth daily. 90 tablet 1   EPINEPHrine  0.3 mg/0.3 mL IJ SOAJ injection INJECT 0.3 MLS INTO THE MUSCLE ONCE FOR 1 DOSE. FOR LIFE-THREATENING ALLERGIC REACTION / ANAPHYLAXIS  1   hydrOXYzine  (VISTARIL ) 25 MG capsule Take 25-75 mg by mouth at bedtime as needed.     lamoTRIgine  (LAMICTAL ) 150 MG tablet TAKE 1 TABLET (150 MG TOTAL) BY MOUTH DAILY. TAKE ALONG WITH 25 MG DAILY 90 tablet 1   lamoTRIgine  (LAMICTAL ) 25 MG tablet TAKE 1 TABLET (25 MG TOTAL) BY MOUTH DAILY. TAKE ALONG WITH 150 MG DAILY 90 tablet 1   levonorgestrel  (MIRENA , 52 MG,) 20 MCG/DAY IUD Frequency:UNKNOWN   Dosage:0.0     Instructions:  Note:Dose: UNKNOWN     levothyroxine  (SYNTHROID ) 100 MCG tablet Take 1 tablet (100 mcg total) by mouth daily before breakfast. 90 tablet 1   linaclotide  (LINZESS ) 145 MCG CAPS capsule TAKE 1 CAPSULE BY MOUTH EVERY DAY BEFORE BREAKFAST 90 capsule 3   meclizine  (ANTIVERT ) 12.5 MG tablet Take 1 tablet (12.5 mg total) by mouth 3 (three) times daily as needed for dizziness. 30 tablet 0   metFORMIN  (GLUCOPHAGE ) 500 MG tablet Take 500 mg by mouth.     Multiple Vitamin (MULTIVITAMIN) tablet Take 1 tablet by mouth daily.     ondansetron  (ZOFRAN -ODT) 4 MG disintegrating tablet Take 1 tablet (4 mg total) by mouth every 8 (eight) hours as needed for nausea or vomiting. 20 tablet 0   SUMAtriptan  (IMITREX ) 50 MG tablet Take 1-2 tablets (50-100 mg total) by mouth every 2 (two) hours as needed for migraine. May repeat in 2 hours if headache persists or recurs. Do not exceed 200 mg in 24 hour period. 30 tablet 1   topiramate (TOPAMAX) 25 MG tablet Take 1 tablet (25 mg total) by  mouth 2 (two) times daily.     traZODone  (DESYREL ) 50 MG tablet Take 0.5-1 tablets (25-50 mg total) by mouth at bedtime as needed for sleep. 90 tablet 0   Current Facility-Administered Medications  Medication Dose Route Frequency Provider Last Rate Last Admin   promethazine   (PHENERGAN ) injection 25 mg  25 mg Intramuscular Q6H PRN Bernardo Fend, DO   25 mg at 01/31/24 1437     Musculoskeletal: Strength & Muscle Tone: within normal limits Gait & Station: normal Patient leans: N/A  Psychiatric Specialty Exam: Review of Systems  Psychiatric/Behavioral:  The patient is nervous/anxious.     Blood pressure 118/80, pulse 72, temperature 97.8 F (36.6 C), temperature source Temporal, height 5' 4 (1.626 m), weight 202 lb 9.6 oz (91.9 kg).Body mass index is 34.78 kg/m.  General Appearance: Casual  Eye Contact:  Good  Speech:  Clear and Coherent  Volume:  Normal  Mood:  Anxious managing well  Affect:  Appropriate  Thought Process:  Goal Directed and Descriptions of Associations: Intact  Orientation:  Full (Time, Place, and Person)  Thought Content: Logical   Suicidal Thoughts:  No  Homicidal Thoughts:  No  Memory:  Immediate;   Fair Recent;   Fair Remote;   Fair  Judgement:  Fair  Insight:  Fair  Psychomotor Activity:  Normal  Concentration:  Concentration: Fair and Attention Span: Fair  Recall:  [name] of Knowledge: Fair  Language: Fair  Akathisia:  No  Handed:  Right  AIMS (if indicated): not done  Assets:  Communication Skills Desire for Improvement Housing Intimacy Resilience Social Support Talents/Skills Transportation  ADL's:  Intact  Cognition: WNL  Sleep:  Fair   Screenings: Midwife Visit from 09/20/2023 in [facility] Office Visit from 11/08/2022 in [facility] Video Visit from 10/24/2021 in [facility] Video Visit from 07/27/2021 in [facility] Video Visit from 10/28/2020 in [facility]  AIMS Total Score 0 0 0 0 0   GAD-7    Flowsheet Row Office Visit from 04/17/2024 in [facility] Office Visit from 09/20/2023 in [facility] Counselor from 01/18/2023 in [facility] Office Visit from 11/28/2022 in [facility] Office Visit from 11/08/2022 in [facility]  Total GAD-7 Score 7 0 17 21 18    PHQ2-9    Flowsheet Row Office Visit from 04/17/2024 in [facility] Office Visit from 01/31/2024 in [facility] Video Visit from 12/21/2023 in [facility] Office Visit from 12/14/2023 in [facility] Office Visit from 11/30/2023 in [facility]  PHQ-2 Total Score 0 0 0 0 0  PHQ-9 Total Score -- -- -- 0 --   Flowsheet Row Office Visit from 04/17/2024 in [facility] Video Visit from 02/13/2024 in [facility] Video Visit from 12/21/2023 in [facility]  C-SSRS RISK CATEGORY Moderate Risk Moderate Risk Moderate Risk     Assessment and Plan: Kathryn Munoz is a 49 year old African-American female who presented for a follow-up appointment, discussed assessment and plan as noted below.  1. Bipolar II disorder in full remission (HCC) most recent episode depressed Currently  reports overall mood symptoms as well-managed. Continue Lamictal  175 mg daily Discussed drug to drug interaction with Topiramate recently added for headaches Continue Trazodone  50 mg at bedtime  2. 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-stable Reports overall OCD symptoms is well-managed on the current medication regimen and therapy sessions Continue psychotherapy Ms. Perkins Continue Celexa  20 mg daily  3. Insomnia due to mental condition-stable Currently reports sleep  is overall good. Continue sleep hygiene techniques Continue Trazodone  50 mg at bedtime Continue Hydroxyzine  25 to 75 mg at bedtime as needed  4. Partner relational problem-chronic Currently interested in pursuing family counseling. Continue CBT  Follow-up Follow-up in clinic in 3 months or sooner if needed.    Collaboration of Care: Collaboration of Care: Referral or follow-up with counselor/therapist AEB patient encouraged to continue CBT I have reviewed notes per Ms. Perkins dated 03/24/2024  Patient/Guardian was advised Release of Information must be obtained prior to any record release in order to collaborate their care with an outside provider. Patient/Guardian was advised if they have not already done so to contact the registration department to sign all necessary forms in order for us  to release information regarding their care.   Consent: Patient/Guardian gives verbal consent for treatment and assignment of benefits for services provided during this visit. Patient/Guardian expressed understanding and agreed to proceed.   This note was generated in part or whole with voice recognition software. Voice recognition is usually quite accurate but there are transcription errors that can and very often do occur. I apologize for any typographical errors that were not detected and corrected.    Jennalee Greaves, MD 04/18/2024, 12:29 PM     [1]  Allergies Allergen Reactions   Date Seed Extract  [Zizyphus Jujuba] Anaphylaxis, Cough, Itching and Swelling   Other Swelling

## 2024-04-17 NOTE — Patient Instructions (Signed)
 Too Much Serotonin in the Body (Serotonin Syndrome): What to Know Serotonin is a chemical that helps to control several functions in the body. This chemical is also called a neurotransmitter. It controls: Brain and nerve cell function. Mood and emotions. Memory. Eating. Sleeping. Sexual activity. Stress response. Having too much serotonin in your body can cause serotonin syndrome. This condition can be harmful to your brain and nerve cells. This can be a life-threatening condition. What are the causes? This condition may be caused by taking medicines or drugs that increase the level of serotonin in your body, such as: Antidepressant medicines. Migraine medicines. Certain pain medicines. Certain drugs, including ecstasy, LSD, cocaine, and amphetamines. Over-the-counter cough or cold medicines that contain dextromethorphan. Certain herbal supplements, including St. John's wort, ginseng, and nutmeg. This condition usually occurs when you take these medicines or drugs together, but it can also happen with a high dose of a single medicine or drug. What increases the risk? You are more likely to develop this condition if: You just started taking a medicine or drug that increases the level of serotonin in the body. You recently increased the dose of a medicine or drug that increases the level of serotonin in the body. You take more than one medicine or drug that increases the level of serotonin in the body. What are the signs or symptoms? Symptoms of this condition usually start within several hours of taking a medicine or drug. Symptoms may be mild or severe. Mild symptoms include: Sweating. Restlessness or agitation. Muscle twitching or stiffness. Rapid heart rate. Nausea, vomiting, or diarrhea. Shivering or goose bumps. Confusion. Severe symptoms include: Irregular heartbeat. Seizures. Loss of consciousness. High fever. How is this diagnosed? This condition may be diagnosed based  on: Your medical history. A physical exam. Your prior use of drugs and medicines. Blood or urine tests. These may be used to rule out other causes of your symptoms. How is this treated? The treatment for this condition depends on the severity of your symptoms. For mild cases, stopping the medicine or drug that caused your condition is usually all that is needed. For moderate to severe cases, treatment in a hospital may be needed to prevent or treat life-threatening symptoms. Treatment may include: Medicines to control your symptoms. IV fluids. Actions to support your breathing. Treatments to control your body temperature. Follow these instructions at home: Medicines  Take over-the-counter and prescription medicines only as told by your health care provider. Check with your health care provider before you start taking any new prescriptions, over-the-counter medicines, herbs, or supplements. Do not combine any medicines that can cause this condition. Lifestyle  Maintain a healthy lifestyle. Eat a healthy diet that includes plenty of vegetables, fruits, whole grains, low-fat dairy products, and lean protein. Do not eat a lot of foods that are high in fat, added sugars, or salt. Get the right amount and quality of sleep. Most adults need 7-9 hours of sleep each night. Make time to exercise, even if it is only for short periods of time. Most adults should exercise for at least 150 minutes each week. Do not drink alcohol. Do not use illegal drugs. Do not take medicines for reasons other than they are prescribed. General instructions Do not use any products that contain nicotine or tobacco. These products include cigarettes, chewing tobacco, and vaping devices, such as e-cigarettes. If you need help quitting, ask your health care provider. Contact a health care provider if: Your symptoms do not improve or they get  worse. Get help right away if: You have worsening confusion, severe headache,  chest pain, high fever, seizures, or loss of consciousness. You experience serious side effects of medicine, such as swelling of your face, lips, tongue, or throat. These symptoms may be an emergency. Get help right away. Call 911. Do not wait to see if the symptoms will go away. Do not drive yourself to the hospital. Also, get help right away if: You have serious thoughts about hurting yourself or others. Take one of these steps if you feel like you may hurt yourself or others, or have thoughts about taking your own life: Go to your nearest emergency room. Call 911. Call the National Suicide Prevention Lifeline at 670-556-4961 or 988. This is open 24 hours a day. Text the Crisis Text Line at (747)167-5129. Summary Serotonin is a chemical that helps to control several functions in the body. High levels of serotonin in the body can cause serotonin syndrome, which can be life-threatening. This condition may be caused by taking medicines or drugs that increase the level of serotonin in your body. Treatment depends on the severity of your symptoms. For mild cases, stopping the medicine or drug that caused your condition is usually all that is needed. Check with your health care provider before you start taking any new prescriptions, over-the-counter medicines, herbs, or supplements. This information is not intended to replace advice given to you by your health care provider. Make sure you discuss any questions you have with your health care provider. Document Revised: 01/17/2024 Document Reviewed: 05/26/2021 Elsevier Patient Education  2025 [employer].

## 2024-04-18 ENCOUNTER — Ambulatory Visit (INDEPENDENT_AMBULATORY_CARE_PROVIDER_SITE_OTHER): Admitting: Licensed Clinical Social Worker

## 2024-04-18 DIAGNOSIS — F3181 Bipolar II disorder: Secondary | ICD-10-CM

## 2024-04-18 DIAGNOSIS — F429 Obsessive-compulsive disorder, unspecified: Secondary | ICD-10-CM | POA: Diagnosis not present

## 2024-04-18 NOTE — Progress Notes (Signed)
 "  THERAPIST PROGRESS NOTE  Virtual Visit via Video Note  I connected with Kathryn Munoz on 04/18/24 at 11:00 AM EST by a video enabled telemedicine application and verified that I am speaking with the correct person using two identifiers.  Location: Patient: Address on file  Provider: Providers address   I discussed the limitations of evaluation and management by telemedicine and the availability of in person appointments. The patient expressed understanding and agreed to proceed.  I discussed the assessment and treatment plan with the patient. The patient was provided an opportunity to ask questions and all were answered. The patient agreed with the plan and demonstrated an understanding of the instructions.   The patient was advised to call back or seek an in-person evaluation if the symptoms worsen or if the condition fails to improve as anticipated.  I provided 57 minutes of non-face-to-face time during this encounter.   [name], LCSW   Session Time: 11-11:57am  Participation Level: Active  Behavioral Response: CasualAlertNegative and Irritable  Type of Therapy: Individual Therapy  Treatment Goals addressed:  Active     BH CCP BIPOLAR DISORDER-MANIA/HYPOMANIA     LTG: Kathryn Munoz will stabilize mood and increase goal-directed behavior as measured by self report  (Progressing)     Start:  01/25/23    Expected End:  05/22/24      05/04/2023: 87% progress. Pt reports she feels like having someone to talk to whom she trusts and who can assist in challenge perspectives has helped her know that she can stand up for herself and handle push back. Pt states, I'm not as afraid of certain situations. Mood is up.     Goal Note     02/22/24: Patient denies manic or depressive episodes in months.          STG: Kathryn Munoz will identify cognitive patterns and beliefs that interfere with therapy (Progressing)     Start:  01/25/23    Expected End:  05/22/24      05/04/2023:  87% progress. Pt states, I have still had some work to do but reaching goals as far of therapy I feel I'm hitting most pf the points that were my focus for therapy.       Work with Kathryn to track symptoms, triggers, and/or skill use through a mood chart, diary card, or journal     Start:  01/25/23         Work with Kathryn to develop at least a list of 2 triggers for a manic episode     Start:  01/25/23         Work with Kathryn to develop a crisis plan which includes a list of a minimum of 2 triggers for emotional dysregulation with corresponding skills to cope ahead with each trigger     Start:  01/25/23           [name]     LTG: Reduce frequency, intensity, and duration of depression symptoms so that daily functioning is improved (Progressing)     Start:  01/25/23    Expected End:  05/22/24      05/04/2023: Definitely managing them a lot better. There has been something that have happened where I would not have been able to process them at all.SABRASABRAI've been able to mature and process and understand more how and why I was reacting to the situation. I definitely feel I'm accomplishing that part of what was causing me depression at the moment. Overall, I  really have a good outlook.        STG: Kathryn Munoz will identify cognitive patterns and beliefs that support depression (Progressing)     Start:  01/25/23    Expected End:  05/22/24      05/04/2023: Reports thought inner child work she has learned to use her voice and assess whom she can trust. Reports improved confidence which assist in challenging negative cognitions.     Goal Note     02/22/23: Patient reports she is aware of negative belief systems and is working to challenge and reframe.          Work with Kathryn to track symptoms, triggers, and/or skill use through a mood chart, diary card, or journal     Start:  01/25/23         Work with Kathryn to identify the major components of a recent episode of  depression: physical symptoms, major thoughts and images, and major behaviors they experienced     Start:  01/25/23         Kathryn Munoz will identify 2 trauma related cognitive distortions     Start:  01/25/23         Kathryn Munoz will identify 3 cognitive distortions they are currently using and write reframing statements to replace them     Start:  01/25/23           Social Interpersonal Effectiveness     LTG: Kathryn Munoz will recognize socially inappropriate behaviors and develop alternative behaviors (Progressing)     Start:  01/25/23    Expected End:  05/22/24       Goal Note     02/22/24: Patient is aware of patterns or avoidance and frequent encounters with inner child experience that interfere with social engagement.          STG: Kathryn Munoz will identify 2 behaviors that engage in social isolation  (Progressing)     Start:  01/25/23    Expected End:  05/22/24      05/04/2023: 90% progress. Notes great improvement. Reports she has had moments where she wanted to cancel but has challenged herself to put herself into uncomfortable situations stating, now I know how to cope and get myself through them.       Observe patient's social engagement     Start:  01/25/23         Educate [name] on appropriate behaviors and boundaries     Start:  01/25/23         Work with Kathryn to identify 2 signs that they are engaging in social isolation     Start:  01/25/23            ProgressTowards Goals: Progressing  Interventions: Solution Focused and Supportive, assertiveness training  Summary: Kathryn Munoz is a 49 y.o. female who presents with symptoms of anxiety and depression. Patient identifies symptoms to include uncontrollable worry, negative self affect, and anxious feelings.Pt was oriented times 5. Pt was cooperative and engaged. Pt denies SI/HI/AVH.    The patient reports that she has been working from home and feels that her routine is off. Her sleep hygiene is good,  but she shares that things have been difficult at home. She expresses anxiety about an upcoming storm, which has been bothering her. As a result of feeling overwhelmed, she feels she acted badly and identifies that her feelings are compounded by the sense that others do not value her time. She feels unprepared for her family responsibilities and overwhelmed  by the weight of these duties.   We explored her childhood feelings of abandonment and the sensation of being unheard. Additionally, we discussed ways she could address the current situation differently, as she recalls not having the security she needed in her childhood.  She also expressed a fear of preparing for marriage therapy and mentioned that she is in survival mode, constantly planning for the worst.   We examined the importance of meeting each others needs in the relationship to feel supported. As a solution, we explored the idea of having a weekly meeting to discuss the schedule, financial goals, and other objectives for the week. We also explored ways she and her spouse can foster moments of connection via date nights, quality time, and meaning communication.    Patient identified a goal to call the marriage therapist this afternoon after her session.   Suicidal/Homicidal: Nowithout intent/plan   Therapist Response: Cln utilized active and supportive reflection to create an environment for patient to process recent life stressors and symptoms. Clinician assessed for current stressors, symptoms, safety since last session. Engaged patient in redirecting her focus to solutions that can help her feel supported within her marriage. Explored ways in which she can continue to communicate her feelings and needs to her husband via assertive communication exploring specific verbiage. Determined actions steps she can take to begin to improve factors within her control, while also ensuring she is valuing personal boundaries to avoid patterns of  mothering.    Plan: Return again in 2 weeks.   Diagnosis: Bipolar II disorder in full remission (HCC)   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     Collaboration of Care: [facility] psychiatrist can access notes and cln. Will review psychiatrists' notes. Check in with the patient and will see LCSW per availability. Patient agreed with treatment recommendations.   Patient/Guardian was advised Release of Information must be obtained prior to any record release in order to collaborate their care with an outside provider. Patient/Guardian was advised if they have not already done so to contact the registration department to sign all necessary forms in order for us  to release information regarding their care.   Consent: Patient/Guardian gives verbal consent for treatment and assignment of benefits for services provided during this visit. Patient/Guardian expressed understanding and agreed to proceed.   [name], LCSW 04/18/2024  "

## 2024-05-02 ENCOUNTER — Ambulatory Visit: Admitting: Licensed Clinical Social Worker

## 2024-05-16 ENCOUNTER — Ambulatory Visit: Admitting: Licensed Clinical Social Worker

## 2024-05-30 ENCOUNTER — Ambulatory Visit: Admitting: Licensed Clinical Social Worker

## 2024-06-12 ENCOUNTER — Ambulatory Visit: Admitting: Internal Medicine

## 2024-07-30 ENCOUNTER — Telehealth: Admitting: Psychiatry

## 2024-09-02 ENCOUNTER — Ambulatory Visit: Admitting: Internal Medicine

## 2024-12-05 ENCOUNTER — Encounter: Admitting: Internal Medicine
